# Patient Record
Sex: Male | Born: 1960 | Race: White | Hispanic: No | Marital: Married | Smoking: Current every day smoker
Health system: Southern US, Community
[De-identification: ages and names within clinical notes are randomized; demographics above are authoritative.]

## PROBLEM LIST (undated history)

## (undated) DIAGNOSIS — F908 Attention-deficit hyperactivity disorder, other type: Secondary | ICD-10-CM

## (undated) DIAGNOSIS — E039 Hypothyroidism, unspecified: Secondary | ICD-10-CM

## (undated) DIAGNOSIS — F319 Bipolar disorder, unspecified: Secondary | ICD-10-CM

## (undated) DIAGNOSIS — E782 Mixed hyperlipidemia: Secondary | ICD-10-CM

## (undated) DIAGNOSIS — I639 Cerebral infarction, unspecified: Secondary | ICD-10-CM

## (undated) DIAGNOSIS — K219 Gastro-esophageal reflux disease without esophagitis: Secondary | ICD-10-CM

## (undated) DIAGNOSIS — G894 Chronic pain syndrome: Secondary | ICD-10-CM

## (undated) DIAGNOSIS — D126 Benign neoplasm of colon, unspecified: Secondary | ICD-10-CM

## (undated) DIAGNOSIS — R0789 Other chest pain: Secondary | ICD-10-CM

## (undated) DIAGNOSIS — G4730 Sleep apnea, unspecified: Secondary | ICD-10-CM

## (undated) DIAGNOSIS — I10 Essential (primary) hypertension: Secondary | ICD-10-CM

## (undated) DIAGNOSIS — I2510 Atherosclerotic heart disease of native coronary artery without angina pectoris: Secondary | ICD-10-CM

## (undated) DIAGNOSIS — E119 Type 2 diabetes mellitus without complications: Secondary | ICD-10-CM

## (undated) DIAGNOSIS — M5416 Radiculopathy, lumbar region: Secondary | ICD-10-CM

## (undated) DIAGNOSIS — M1990 Unspecified osteoarthritis, unspecified site: Secondary | ICD-10-CM

## (undated) DIAGNOSIS — J45909 Unspecified asthma, uncomplicated: Secondary | ICD-10-CM

## (undated) DIAGNOSIS — J439 Emphysema, unspecified: Secondary | ICD-10-CM

## (undated) DIAGNOSIS — J189 Pneumonia, unspecified organism: Secondary | ICD-10-CM

## (undated) DIAGNOSIS — E1169 Type 2 diabetes mellitus with other specified complication: Secondary | ICD-10-CM

## (undated) DIAGNOSIS — R911 Solitary pulmonary nodule: Secondary | ICD-10-CM

## (undated) DIAGNOSIS — F4310 Post-traumatic stress disorder, unspecified: Secondary | ICD-10-CM

## (undated) DIAGNOSIS — F3181 Bipolar II disorder: Secondary | ICD-10-CM

## (undated) DIAGNOSIS — E1140 Type 2 diabetes mellitus with diabetic neuropathy, unspecified: Secondary | ICD-10-CM

## (undated) DIAGNOSIS — N529 Male erectile dysfunction, unspecified: Secondary | ICD-10-CM

## (undated) DIAGNOSIS — T8859XA Other complications of anesthesia, initial encounter: Secondary | ICD-10-CM

## (undated) DIAGNOSIS — I700 Atherosclerosis of aorta: Secondary | ICD-10-CM

## (undated) DIAGNOSIS — E785 Hyperlipidemia, unspecified: Secondary | ICD-10-CM

## (undated) DIAGNOSIS — I499 Cardiac arrhythmia, unspecified: Secondary | ICD-10-CM

## (undated) DIAGNOSIS — T4145XA Adverse effect of unspecified anesthetic, initial encounter: Secondary | ICD-10-CM

## (undated) DIAGNOSIS — R51 Headache: Secondary | ICD-10-CM

## (undated) DIAGNOSIS — K760 Fatty (change of) liver, not elsewhere classified: Secondary | ICD-10-CM

## (undated) HISTORY — DX: Attention-deficit hyperactivity disorder, other type: F90.8

## (undated) HISTORY — DX: Benign neoplasm of colon, unspecified: D12.6

## (undated) HISTORY — DX: Gastro-esophageal reflux disease without esophagitis: K21.9

## (undated) HISTORY — PX: BACK SURGERY: SHX140

## (undated) HISTORY — PX: NOSE SURGERY: SHX723

## (undated) HISTORY — DX: Solitary pulmonary nodule: R91.1

## (undated) HISTORY — DX: Atherosclerotic heart disease of native coronary artery without angina pectoris: I25.10

## (undated) HISTORY — DX: Other chest pain: R07.89

## (undated) HISTORY — PX: HERNIA REPAIR: SHX51

## (undated) HISTORY — DX: Radiculopathy, lumbar region: M54.16

## (undated) HISTORY — DX: Atherosclerosis of aorta: I70.0

## (undated) HISTORY — DX: Male erectile dysfunction, unspecified: N52.9

## (undated) HISTORY — DX: Type 2 diabetes mellitus with other specified complication: E11.69

## (undated) HISTORY — DX: Cardiac arrhythmia, unspecified: I49.9

## (undated) HISTORY — DX: Chronic pain syndrome: G89.4

## (undated) HISTORY — PX: HAND SURGERY: SHX662

## (undated) HISTORY — DX: Bipolar disorder, unspecified: F31.9

## (undated) HISTORY — PX: MULTIPLE TOOTH EXTRACTIONS: SHX2053

## (undated) HISTORY — DX: Bipolar II disorder: F31.81

## (undated) HISTORY — DX: Fatty (change of) liver, not elsewhere classified: K76.0

## (undated) HISTORY — DX: Type 2 diabetes mellitus with diabetic neuropathy, unspecified: E11.40

## (undated) HISTORY — DX: Emphysema, unspecified: J43.9

## (undated) HISTORY — DX: Mixed hyperlipidemia: E78.2

## (undated) HISTORY — DX: Type 2 diabetes mellitus with other specified complication: E78.5

---

## 1999-04-15 ENCOUNTER — Emergency Department (HOSPITAL_COMMUNITY): Admission: EM | Admit: 1999-04-15 | Discharge: 1999-04-15 | Payer: Self-pay | Admitting: Emergency Medicine

## 1999-04-15 ENCOUNTER — Encounter: Payer: Self-pay | Admitting: Emergency Medicine

## 2001-01-20 ENCOUNTER — Encounter: Payer: Self-pay | Admitting: Orthopedic Surgery

## 2001-01-20 ENCOUNTER — Encounter: Admission: RE | Admit: 2001-01-20 | Discharge: 2001-01-20 | Payer: Self-pay | Admitting: Orthopedic Surgery

## 2001-03-01 ENCOUNTER — Encounter: Admission: RE | Admit: 2001-03-01 | Discharge: 2001-03-19 | Payer: Self-pay | Admitting: Orthopedic Surgery

## 2002-05-08 ENCOUNTER — Inpatient Hospital Stay (HOSPITAL_COMMUNITY): Admission: EM | Admit: 2002-05-08 | Discharge: 2002-05-16 | Payer: Self-pay | Admitting: Psychiatry

## 2002-06-22 ENCOUNTER — Encounter: Admission: RE | Admit: 2002-06-22 | Discharge: 2002-06-22 | Payer: Self-pay | Admitting: Orthopedic Surgery

## 2002-06-22 ENCOUNTER — Encounter: Payer: Self-pay | Admitting: Orthopedic Surgery

## 2003-04-24 ENCOUNTER — Encounter: Payer: Self-pay | Admitting: Orthopedic Surgery

## 2003-04-25 ENCOUNTER — Ambulatory Visit (HOSPITAL_COMMUNITY): Admission: RE | Admit: 2003-04-25 | Discharge: 2003-04-25 | Payer: Self-pay | Admitting: Orthopedic Surgery

## 2003-07-17 ENCOUNTER — Emergency Department (HOSPITAL_COMMUNITY): Admission: EM | Admit: 2003-07-17 | Discharge: 2003-07-17 | Payer: Self-pay | Admitting: Emergency Medicine

## 2003-09-26 ENCOUNTER — Inpatient Hospital Stay (HOSPITAL_COMMUNITY): Admission: AD | Admit: 2003-09-26 | Discharge: 2003-10-10 | Payer: Self-pay | Admitting: Psychiatry

## 2003-10-01 ENCOUNTER — Emergency Department (HOSPITAL_COMMUNITY): Admission: EM | Admit: 2003-10-01 | Discharge: 2003-10-02 | Payer: Self-pay | Admitting: Emergency Medicine

## 2004-12-03 ENCOUNTER — Inpatient Hospital Stay (HOSPITAL_COMMUNITY): Admission: RE | Admit: 2004-12-03 | Discharge: 2004-12-25 | Payer: Self-pay | Admitting: Psychiatry

## 2004-12-03 ENCOUNTER — Ambulatory Visit: Payer: Self-pay | Admitting: Psychiatry

## 2004-12-06 ENCOUNTER — Encounter: Payer: Self-pay | Admitting: Psychiatry

## 2004-12-12 ENCOUNTER — Encounter: Admission: RE | Admit: 2004-12-12 | Discharge: 2004-12-12 | Payer: Self-pay | Admitting: Psychiatry

## 2008-03-02 ENCOUNTER — Ambulatory Visit (HOSPITAL_BASED_OUTPATIENT_CLINIC_OR_DEPARTMENT_OTHER): Admission: RE | Admit: 2008-03-02 | Discharge: 2008-03-02 | Payer: Self-pay | Admitting: Orthopedic Surgery

## 2008-04-13 ENCOUNTER — Encounter: Admission: RE | Admit: 2008-04-13 | Discharge: 2008-04-13 | Payer: Self-pay | Admitting: Orthopedic Surgery

## 2008-05-04 ENCOUNTER — Ambulatory Visit (HOSPITAL_BASED_OUTPATIENT_CLINIC_OR_DEPARTMENT_OTHER): Admission: RE | Admit: 2008-05-04 | Discharge: 2008-05-04 | Payer: Self-pay | Admitting: Orthopedic Surgery

## 2008-09-25 ENCOUNTER — Inpatient Hospital Stay (HOSPITAL_COMMUNITY): Admission: RE | Admit: 2008-09-25 | Discharge: 2008-09-29 | Payer: Self-pay | Admitting: Neurosurgery

## 2010-08-11 HISTORY — PX: TOTAL KNEE ARTHROPLASTY: SHX125

## 2010-09-01 ENCOUNTER — Encounter (HOSPITAL_COMMUNITY): Payer: Self-pay | Admitting: Psychiatry

## 2010-11-05 ENCOUNTER — Encounter (HOSPITAL_COMMUNITY)
Admission: RE | Admit: 2010-11-05 | Discharge: 2010-11-05 | Disposition: A | Discharge status: Home / Self Care | Payer: BC Managed Care – PPO | Source: Ambulatory Visit | Attending: Orthopedic Surgery | Admitting: Orthopedic Surgery

## 2010-11-05 ENCOUNTER — Ambulatory Visit (HOSPITAL_COMMUNITY)
Admission: RE | Admit: 2010-11-05 | Discharge: 2010-11-05 | Disposition: A | Discharge status: Home / Self Care | Payer: BC Managed Care – PPO | Source: Ambulatory Visit | Attending: Orthopedic Surgery | Admitting: Orthopedic Surgery

## 2010-11-05 ENCOUNTER — Other Ambulatory Visit (HOSPITAL_COMMUNITY): Payer: Self-pay | Admitting: Orthopedic Surgery

## 2010-11-05 DIAGNOSIS — M1711 Unilateral primary osteoarthritis, right knee: Secondary | ICD-10-CM

## 2010-11-05 DIAGNOSIS — I10 Essential (primary) hypertension: Secondary | ICD-10-CM | POA: Insufficient documentation

## 2010-11-05 DIAGNOSIS — Z01812 Encounter for preprocedural laboratory examination: Secondary | ICD-10-CM | POA: Insufficient documentation

## 2010-11-05 DIAGNOSIS — Z01818 Encounter for other preprocedural examination: Secondary | ICD-10-CM | POA: Insufficient documentation

## 2010-11-05 DIAGNOSIS — G4730 Sleep apnea, unspecified: Secondary | ICD-10-CM | POA: Insufficient documentation

## 2010-11-05 DIAGNOSIS — F17200 Nicotine dependence, unspecified, uncomplicated: Secondary | ICD-10-CM | POA: Insufficient documentation

## 2010-11-05 DIAGNOSIS — Z01810 Encounter for preprocedural cardiovascular examination: Secondary | ICD-10-CM | POA: Insufficient documentation

## 2010-11-05 LAB — URINALYSIS, ROUTINE W REFLEX MICROSCOPIC
Glucose, UA: NEGATIVE mg/dL
Hgb urine dipstick: NEGATIVE
Specific Gravity, Urine: 1.008 (ref 1.005–1.030)
Urobilinogen, UA: 0.2 mg/dL (ref 0.0–1.0)

## 2010-11-05 LAB — CBC
HCT: 46.8 % (ref 39.0–52.0)
Hemoglobin: 15.9 g/dL (ref 13.0–17.0)
RDW: 13.5 % (ref 11.5–15.5)
WBC: 10.2 10*3/uL (ref 4.0–10.5)

## 2010-11-05 LAB — PROTIME-INR
INR: 0.97 (ref 0.00–1.49)
Prothrombin Time: 13.1 seconds (ref 11.6–15.2)

## 2010-11-05 LAB — SURGICAL PCR SCREEN
MRSA, PCR: NEGATIVE
Staphylococcus aureus: NEGATIVE

## 2010-11-05 LAB — COMPREHENSIVE METABOLIC PANEL
ALT: 30 U/L (ref 0–53)
CO2: 30 mEq/L (ref 19–32)
Calcium: 9.4 mg/dL (ref 8.4–10.5)
Creatinine, Ser: 0.89 mg/dL (ref 0.4–1.5)
GFR calc non Af Amer: >60 mL/min (ref >60)
Glucose, Bld: 110 mg/dL — ABNORMAL HIGH (ref 70–99)
Sodium: 135 mEq/L (ref 135–145)
Total Protein: 6.8 g/dL (ref 6.0–8.3)

## 2010-11-13 ENCOUNTER — Inpatient Hospital Stay (HOSPITAL_COMMUNITY): Payer: BC Managed Care – PPO

## 2010-11-13 ENCOUNTER — Inpatient Hospital Stay (HOSPITAL_COMMUNITY)
Admission: RE | Admit: 2010-11-13 | Discharge: 2010-11-15 | DRG: 209 | Disposition: A | Discharge status: Home Health | Payer: BC Managed Care – PPO | Source: Ambulatory Visit | Attending: Orthopedic Surgery | Admitting: Orthopedic Surgery

## 2010-11-13 DIAGNOSIS — E119 Type 2 diabetes mellitus without complications: Secondary | ICD-10-CM | POA: Diagnosis present

## 2010-11-13 DIAGNOSIS — E669 Obesity, unspecified: Secondary | ICD-10-CM | POA: Diagnosis present

## 2010-11-13 DIAGNOSIS — J4489 Other specified chronic obstructive pulmonary disease: Secondary | ICD-10-CM | POA: Diagnosis present

## 2010-11-13 DIAGNOSIS — E039 Hypothyroidism, unspecified: Secondary | ICD-10-CM | POA: Diagnosis present

## 2010-11-13 DIAGNOSIS — F319 Bipolar disorder, unspecified: Secondary | ICD-10-CM | POA: Diagnosis present

## 2010-11-13 DIAGNOSIS — M1710 Unilateral primary osteoarthritis, unspecified knee: Principal | ICD-10-CM | POA: Diagnosis present

## 2010-11-13 DIAGNOSIS — F17200 Nicotine dependence, unspecified, uncomplicated: Secondary | ICD-10-CM | POA: Diagnosis present

## 2010-11-13 DIAGNOSIS — J449 Chronic obstructive pulmonary disease, unspecified: Secondary | ICD-10-CM | POA: Diagnosis present

## 2010-11-13 DIAGNOSIS — G4733 Obstructive sleep apnea (adult) (pediatric): Secondary | ICD-10-CM | POA: Diagnosis present

## 2010-11-13 LAB — GLUCOSE, CAPILLARY: Glucose-Capillary: 155 mg/dL — ABNORMAL HIGH (ref 70–99)

## 2010-11-13 LAB — TYPE AND SCREEN
ABO/RH(D): A POS
Antibody Screen: NEGATIVE

## 2010-11-14 LAB — GLUCOSE, CAPILLARY: Glucose-Capillary: 171 mg/dL — ABNORMAL HIGH (ref 70–99)

## 2010-11-14 LAB — CBC
HCT: 38.3 % — ABNORMAL LOW (ref 39.0–52.0)
MCHC: 33.2 g/dL (ref 30.0–36.0)
Platelets: 257 10*3/uL (ref 150–400)
RDW: 13.9 % (ref 11.5–15.5)
WBC: 10.4 10*3/uL (ref 4.0–10.5)

## 2010-11-14 LAB — BASIC METABOLIC PANEL
CO2: 25 mEq/L (ref 19–32)
GFR calc Af Amer: >60 mL/min (ref >60)
GFR calc non Af Amer: >60 mL/min (ref >60)
Glucose, Bld: 159 mg/dL — ABNORMAL HIGH (ref 70–99)

## 2010-11-14 LAB — PROTIME-INR: INR: 1.11 (ref 0.00–1.49)

## 2010-11-14 LAB — HEMOGLOBIN A1C
Hgb A1c MFr Bld: 6.6 % — ABNORMAL HIGH (ref <5.7)
Mean Plasma Glucose: 143 mg/dL — ABNORMAL HIGH (ref <117)

## 2010-11-14 NOTE — Op Note (Signed)
NAME:  Rodney Leach, Rodney Leach NO.:  [id number]  MEDICAL RECORD NO.:  [id number]           PATIENT TYPE:  I  LOCATION:  5040                         FACILITY:  [facility]  PHYSICIAN:  Loreta Ave, M.D. DATE OF BIRTH:  15-Nov-1960  DATE OF PROCEDURE:  11/13/2010 DATE OF DISCHARGE:                              OPERATIVE REPORT   PREOPERATIVE DIAGNOSIS:  Right knee end-stage degenerative arthritis with some increased valgus alignment and more than 7 degrees flexion contracture.  Tricompartmental arthritis.  POSTOPERATIVE DIAGNOSIS:  Right knee end-stage degenerative arthritis with some increased valgus alignment and more than 7 degrees flexion contracture.  Tricompartmental arthritis.  PROCEDURE:  Modified minimally invasive right total knee replacement Stryker triathlon prosthesis.  Soft tissue balancing.  Cemented pegged posterior stabilized #6 femoral component.  Cemented #6 tibial component with a 9-mm polyethylene insert.  Cemented resurfacing 40-mm patellar component.  Soft tissue balancing.  SURGEON:  Loreta Ave, MD  ASSISTANT:  Genene Churn. Barry Dienes, [place], present throughout the entire case and necessary for timely completion of procedure.  ANESTHESIA:  General.  BLOOD LOSS:  Minimal.  SPECIMENS:  None.  CULTURES:  None.  COMPLICATIONS:  None.  DRESSING:  Soft compressive knee immobilizer.  DRAIN:  Hemovac x1.  PROCEDURE:  The patient was brought to the operating room and placed on the operating table in supine position.  After adequate anesthesia had been obtained, leg examined.  A 7-degrees flexion contracture, further flexion 90.  Marked grating crepitus.  A little increased valgus but tight throughout.  Tourniquet applied.  Prepped and draped in the usual sterile fashion.  Exsanguinated with elevation and Esmarch and tourniquet inflated to 350 mmHg.  Straight incision above the patella down to tibial tubercle.  Medial arthrotomy, vastus splitting.   Knee exposed.  Medial capsule release.  Marked grade 4 change throughout. Remnants of menisci, cruciate ligaments, loose body, spurs removed. Distal femur exposed.  Intramedullary guide placed.  Distal cut 10 mm set at 5 degrees of valgus.  Using epicondylar axis, femur was sized, cut, and fitted for posterior stabilized #6 component.  Proximal tibia exposed.  Extramedullary guide.  A 3-degree posterior slope cut.  Taken below the defect which was medial.  Sized to #6 component.  Recess examined to be sure all spurs and loose bodies removed.  Patella exposed, posterior 11 mm removed.  Size, drilled, and fitted for 40-mm component.  Medial capsule release had already been done.  Knee was cleared out.  Trials were put in place.  The #6 above and below and 9-mm insert.  Good tracking with a 40-mm patella.  Full extension, full flexion, good mechanical axis with good stability in flexion/extension. Tibia was marked for rotation and hand reamed.  All trials removed. Copious irrigation with a pulse irrigating device.  Cement prepared and placed on all components, firmly seated.  Polyethylene attached to tibia and knee reduced.  Patella held with clamp.  Once cement had hardened, it was reexamined.  Again, pleased with alignment, stability, motion, and tracking.  Hemovac placed through a separate stab wound.  Knee thoroughly irrigated with a pulse lavage.  Arthrotomy  closed with #1 Vicryl.  Skin and subcutaneous tissue with Vicryl and staples.  Knee injected with Marcaine.  Hemovac clamp.  Sterile compressive dressing applied.  Tourniquet deflated and removed.  Knee immobilizer applied. Anesthesia reversed.  Brought to recovery room.  Tolerated surgery well. No complications.     Loreta Ave, M.D.     DFM/MEDQ  D:  11/13/2010  T:  11/14/2010  Job:  [id number]  Electronically Signed by Mckinley Jewel M.D. on 11/14/2010 02:10:06 PM

## 2010-11-15 LAB — CBC
HCT: 40 % (ref 39.0–52.0)
Hemoglobin: 13.2 g/dL (ref 13.0–17.0)
MCHC: 33 g/dL (ref 30.0–36.0)
RBC: 4.46 MIL/uL (ref 4.22–5.81)

## 2010-11-15 LAB — BASIC METABOLIC PANEL
CO2: 28 mEq/L (ref 19–32)
Calcium: 8.6 mg/dL (ref 8.4–10.5)
Chloride: 97 mEq/L (ref 96–112)
Glucose, Bld: 155 mg/dL — ABNORMAL HIGH (ref 70–99)
Potassium: 4.2 mEq/L (ref 3.5–5.1)
Sodium: 132 mEq/L — ABNORMAL LOW (ref 135–145)

## 2010-11-15 LAB — GLUCOSE, CAPILLARY

## 2010-11-15 LAB — PROTIME-INR: INR: 1.27 (ref 0.00–1.49)

## 2010-11-26 LAB — GLUCOSE, CAPILLARY
Glucose-Capillary: 141 mg/dL — ABNORMAL HIGH (ref 70–99)
Glucose-Capillary: 119 mg/dL — ABNORMAL HIGH (ref 70–99)
Glucose-Capillary: 122 mg/dL — ABNORMAL HIGH (ref 70–99)
Glucose-Capillary: 128 mg/dL — ABNORMAL HIGH (ref 70–99)
Glucose-Capillary: 148 mg/dL — ABNORMAL HIGH (ref 70–99)
Glucose-Capillary: 153 mg/dL — ABNORMAL HIGH (ref 70–99)
Glucose-Capillary: 174 mg/dL — ABNORMAL HIGH (ref 70–99)
Glucose-Capillary: 174 mg/dL — ABNORMAL HIGH (ref 70–99)
Glucose-Capillary: 182 mg/dL — ABNORMAL HIGH (ref 70–99)

## 2010-11-26 LAB — CBC
HCT: 44.4 % (ref 39.0–52.0)
HCT: 37.7 % — ABNORMAL LOW (ref 39.0–52.0)
Hemoglobin: 15.2 g/dL (ref 13.0–17.0)
Hemoglobin: 12.9 g/dL — ABNORMAL LOW (ref 13.0–17.0)
MCHC: 34.2 g/dL (ref 30.0–36.0)
MCV: 92.6 fL (ref 78.0–100.0)
Platelets: 222 10*3/uL (ref 150–400)
RBC: 4.8 MIL/uL (ref 4.22–5.81)
RDW: 14.4 % (ref 11.5–15.5)
WBC: 9.6 10*3/uL (ref 4.0–10.5)

## 2010-11-26 LAB — BASIC METABOLIC PANEL
BUN: 16 mg/dL (ref 6–23)
CO2: 29 mEq/L (ref 19–32)
Calcium: 8.5 mg/dL (ref 8.4–10.5)
Chloride: 99 mEq/L (ref 96–112)
GFR calc Af Amer: >60 mL/min (ref >60)
GFR calc non Af Amer: >60 mL/min (ref >60)
GFR calc non Af Amer: >60 mL/min (ref >60)
Glucose, Bld: 164 mg/dL — ABNORMAL HIGH (ref 70–99)
Potassium: 4.8 mEq/L (ref 3.5–5.1)
Sodium: 134 mEq/L — ABNORMAL LOW (ref 135–145)

## 2010-11-26 LAB — TYPE AND SCREEN

## 2010-11-26 LAB — ABO/RH: ABO/RH(D): A POS

## 2010-12-24 NOTE — Op Note (Signed)
NAME:  Rodney Leach, Rodney Leach NO.:  [id number]   MEDICAL RECORD NO.:  [id number]          PATIENT TYPE:  AMB   LOCATION:  DSC                          FACILITY:  [facility]   PHYSICIAN:  Cindee Salt, M.D.       DATE OF BIRTH:  [birth year]   DATE OF PROCEDURE:  03/02/2008  DATE OF DISCHARGE:                               OPERATIVE REPORT   PREOPERATIVE DIAGNOSIS:  Carpal tunnel syndrome, right hand.   POSTOPERATIVE DIAGNOSIS:  Carpal tunnel syndrome, right hand.   OPERATION:  Decompression right median nerve.   SURGEON:  Cindee Salt, MD   ASSISTANT:  Carolyne Fiscal, RN   ANESTHESIA:  Forearm based IV regional with supplementation, 1%  Xylocaine without epinephrine.   ANESTHESIOLOGIST:  Bedelia Person, MD   HISTORY:  The patient is a 50 year old male with a history of carpal  tunnel syndrome.  EMG and nerve conduction is positive, which has not  responded to conservative treatment.  He is desirous of proceeding to  have this surgically released.  His postoperative course discussed along  with risks and complications.  He is aware that there is no guarantee  with the surgery, possibility of infection, recurrence of injury to  arteries, nerves, tendons, complete relief of symptoms, and dystrophy.  In the preoperative area, the patient was seen, the extremity marked by  both the patient and surgeon, questions encouraged and answered.  Antibiotic given.   PROCEDURE:  The patient was brought to the operating room where a  forearm-based IV regional anesthetic was carried out without difficulty,  was prepped using DuraPrep, supine position, right arm free.  A time-out  was taken.  A longitudinal incision was then made over the palm of the  hand and carried down through subcutaneous tissue.  Bleeders  electrocauterized.  A considerable amount of fatty tissue was  encountered.  Blunt and sharp dissection, the distal margin of the  retinaculum was identified.  The incision was then made to the  ulnar  side of median nerve with blunt and sharp dissection.  A right-angle and  Sewall retractor were placed between skin and forearm fascia.  The  fascia was released for approximately a centimeter and a half proximal  to the wrist crease under direct vision.  An area of deformity to the  nerve along with area of hyperemia was immediately apparent.  No further  lesions were identified.  The wound was irrigated.  Skin was then closed  with interrupted 5-0 Vicryl Rapide sutures.  A sterile  compressive dressing and splint was applied with the fingers free.  The  patient tolerated the procedure well and was taken to the recovery room  for observation in satisfactory condition.  He will be discharged to  home, to return to [facility] in 1 week on [place] NX.            ______________________________  Cindee Salt, M.D.     GK/MEDQ  D:  03/02/2008  T:  03/03/2008  Job:  [id number]

## 2010-12-24 NOTE — Op Note (Signed)
NAME:  Rodney Leach, Rodney Leach NO.:  [id number]   MEDICAL RECORD NO.:  [id number]          PATIENT TYPE:  INP   LOCATION:  3035                         FACILITY:  [facility]   PHYSICIAN:  Cristi Loron, M.D.DATE OF BIRTH:  August 31, 1960   DATE OF PROCEDURE:  09/25/2008  DATE OF DISCHARGE:                               OPERATIVE REPORT   BRIEF HISTORY:  The patient is a 50 year old white male who has suffered  from back and leg pain.  He failed medical management.  He was worked up  with a lumbar MRI and diskogram, which demonstrated the patient had disk  degeneration pain at L5-S1.  I discussed the various treatment options  with the patient including surgery.  He has weighed the risks, benefits,  and alternatives of the surgery and desired to proceed with a L5-S1  decompression, instrumentation, and fusion.   PREOPERATIVE DIAGNOSES:  L5-S1 degenerative disk disease, stenosis,  lumbar radiculopathy with lumbago.   POSTOPERATIVE DIAGNOSES:  L5-S1 degenerative disk disease, stenosis,  lumbar radiculopathy with lumbago.   PROCEDURE:  Bilateral L5 laminotomies and foraminotomies to decompress  the bilateral L5 and S1 nerve roots; L5-S1 posterior lumbar interbody  fusion with local morselized autograft bone and Actifuse/Vitoss bone  graft extender; insertion of L5-S1 interbody prosthesis (Capstone PEEK  interbody prosthesis); L5-S1 posterior nonsegmental instrumentation with  Legacy titanium pedicle screws and rods; L5-S1 posterolateral  arthrodesis with local morselized autograft bone, Actifuse/Vitoss bone  graft extenders.   SURGEON:  Cristi Loron, MD   ASSISTANT:  Payton Doughty, MD   ANESTHESIA:  General endotracheal.   ESTIMATED BLOOD LOSS:  300 mL.   SPECIMENS:  None.   DRAINS:  None.   COMPLICATIONS:  None.   DESCRIPTION FOR PROCEDURE:  The patient was brought to the operating  room by the Anesthesia team.  General endotracheal anesthesia was  induced.   The patient remained in the supine position.  The patient was  then carefully turned to the prone position on the Wilson frame.  His  lumbosacral region was then shaved with clippers and prepared with  Betadine scrub and Betadine solution.  Sterile drapes were applied.  I  then injected the area to be incised with Marcaine with epinephrine  solution.  I used a scalpel to make a linear midline incision over the  L5-S1 interspace.  I used electrocautery to perform a bilateral  subperiosteal dissection exposing spinous process and lamina of L3, 4,  5, and the upper sacrum.  We then obtained intraoperative radiograph to  confirm our location and then inserted the Versatrac retractor for  exposure.   Because of the patient's severe facet arthropathy, foraminal stenosis,  etc., a greater decompression was required for a posterior lumbar  interbody fusion, i.e. we performed a medial facetectomy and wide  foraminotomies about the bilateral L5 and S1 nerve roots.  This  completed the decompression.   We now turned our attention to the posterior lumbar fusion.  We incised  the L5-S1 intervertebral disk with a 15-blade scalpel and performed a  partial intervertebral section using pituitary forceps.  We  then  prepared the vertebral endplates for the fusion by clearing the soft  tissue with the curette.  We used trial spacers and determined using 10  x 26 mm Capstone PEEK interbody prosthesis.  We prefilled this  prosthesis with combination of local autograft bone, Actifuse, and  Vitoss bone graft extenders.  We then inserted the prosthesis into the  interspace of course after retracting the neural structures out of  harm's way.  There was good snug fit of prosthesis.  We filled in the  remainder of the disk space with local autograft bone, Actifuse, and  Vitoss bone graft extenders.  This completed the posterior lumbar  interbody fusion.   We now turned our attention to the instrumentation.   Under fluoroscopic  guidance, we cannulated the bilateral L5 and S1 pedicles with a bone  probe.  We tapped the pedicle with a 6.5 mm tap and then probed inside  the tapped pedicles with a ball probe to rule out cortical breeches.  We  then inserted 7.5 x 50-mm pedicle screws bilaterally at L5 and 7.5 x 45  screws bilaterally at S1.  We palpated along the medial aspect of the L5-  S1 pedicles and noted there was no cortical breeches.  We then connected  unilateral pedicle screws with lordotic rod.  We compressed the  construct and secured the plates with the caps, which we tightened  appropriately.  This completed the instrumentation.   We now turned our attention to posterolateral arthrodesis.  We used high-  speed drill to decorticate the remainder of the L5-S1 facet pars  transverse processes upper sacrum, etc. and then laid a combination of  local morselized autograft bone and Actifuse/Vitoss bone graft extenders  over these decorticated posterior structures.  This completed the  posterolateral arthrodesis.   We then obtained hemostasis using bipolar electrocautery.  We palpated  along the ventral surface of the thecal sac and along the external route  of bilateral L5-S1 nerve roots and noted they were well decompressed.  We then removed the retractor and then reapproximated the patient's  thoracolumbar fascia with interrupted #1 Vicryl suture, subcutaneous  using interrupted 2-0 Vicryl suture, and skin with Steri-Strips and  Benzoin.  The wound was then coated with bacitracin ointment.  A sterile  dressing was applied.  The drapes were removed and the patient was  subsequently returned to supine position where he was extubated by the  Anesthesia team and transported to post anesthesia care unit in stable  condition.  All sponge, instrument, and needle counts were correct at  the end of this case.      Cristi Loron, M.D.  Electronically Signed     JDJ/MEDQ  D:   09/25/2008  T:  09/26/2008  Job:  [id number]

## 2010-12-24 NOTE — Op Note (Signed)
NAME:  HAMZEH, TALL NO.:  [id number]   MEDICAL RECORD NO.:  [id number]          PATIENT TYPE:  AMB   LOCATION:  DSC                          FACILITY:  [facility]   PHYSICIAN:  Cindee Salt, M.D.       DATE OF BIRTH:  01/04/1961   DATE OF PROCEDURE:  05/04/2008  DATE OF DISCHARGE:                               OPERATIVE REPORT   PREOPERATIVE DIAGNOSIS:  Carpal tunnel syndrome, left hand.   POSTOPERATIVE DIAGNOSIS:  Carpal tunnel syndrome, left hand.   OPERATION:  Decompression, left median nerve.   SURGEON:  Cindee Salt, MD   ASSISTANT:  Carolyne Fiscal, RN   ANESTHESIA:  Forearm-based IV regional.   ANESTHESIOLOGIST:  Zenon Mayo, MD   HISTORY:  The patient is a 50 year old male with a history of carpal  tunnel syndrome.  EMG, nerve conductions positive.  Unresponsive to  conservative treatment.  He has elected to proceed to undergo  decompression.  He is aware of risks and complications including  infection; recurrence; injury to arteries, nerves, tendons; incomplete  relief of symptoms; and dystrophy.  In the preoperative area, the  patient is seen, the extremity marked by both the patient and surgeon,  antibiotic given, questions encouraged and answered.   PROCEDURE:  The patient was brought to the operating room where a  forearm-based IV regional anesthetic was carried out without difficulty.  He was prepped using DuraPrep in supine position with left arm free.  A  time-out was taken.  A longitudinal incision was made in the palm,  carried down through the subcutaneous tissue.  Bleeders were  electrocauterized.  Palmar fascia was split.  Superficial palmar arch  identified.  Flexor tendon to the Pribyl little finger identified to the  ulnar side of the median nerve.  Carpal retinaculum was incised with  sharp dissection.  Right angle and Sewall retractor were placed between  skin and forearm fascia.  The fascia was released for approximately a  centimeter  and a half proximal to the wrist crease under direct vision.  Canal was explored.  No further lesions were identified.  Area  compression to the nerve was apparent.  The wound was irrigated.  The  skin closed with interrupted 5-0 Vicryl Rapide sutures.  Sterile  compressive dressing and splint to the wrist was applied with fingers  free.  The patient tolerated the procedure well, was taken to the  recovery room for observation in satisfactory condition.  He will be  discharged home to return to [facility] in 1 week, on  Vicodin.           ______________________________  Cindee Salt, M.D.     GK/MEDQ  D:  05/04/2008  T:  05/05/2008  Job:  [id number]

## 2010-12-27 NOTE — Discharge Summary (Signed)
NAME:  Rodney Leach, Rodney Leach NO.:  [id number]   MEDICAL RECORD NO.:  [id number]          PATIENT TYPE:  IPS   LOCATION:  0301                          FACILITY:  [facility]   PHYSICIAN:  Jeanice Lim, M.D. DATE OF BIRTH:  01-20-1961   DATE OF ADMISSION:  12/03/2004  DATE OF DISCHARGE:  12/25/2004                                 DISCHARGE SUMMARY   IDENTIFYING DATA:  This is a 50 year old married Caucasian male voluntarily  admitted with a history of bipolar disorder.  Woke up at 1 a.m. six weeks  ago and felt that he had ruminating, racing thoughts, felt that medications  had quit on him.  Episodes of anxiety, panic, drinking a six-pack of beer to  control anxiety, having suicidal thoughts with a lot of frustration about  mood swings.  History of previous bipolar decompensation and then drinking  trying to control mood until he was stabilized and always requiring  hospitalization after beginning to drink.  Otherwise remained sober in  between episodes of bipolar decompensation.  Followed up at [facility].  History of narcissistic personality disorder.  Lives with girlfriend.   MEDICAL HISTORY:  Chronic knee pain, Crohn's disease and hypertension.   MEDICATIONS:  Had been on multiple medications and multiple changes occurred  at mental health center trying to stabilize him prior to admission which is  what happened prior to the last admission.   ALLERGIES:  SULFA medications.   PHYSICAL EXAMINATION:  Physical and neurologic exam within normal limits.   MENTAL STATUS EXAM:  Fully alert, cooperative.  Anxious affect.  Normal pace  of speech.  Anxious, irritable, labile, angry, threatening at times but  reporting that the first person he would hurt would be himself.  Cognition  was intact.  Judgment and insight poor.   ADMISSION DIAGNOSES:   AXIS I:  1.  Bipolar disorder, type 2, versus bipolar disorder, type 1, mixed state.  2.  Alcohol abuse.   AXIS II:  None.   AXIS III:  1.  Chronic knee pain.  2.  Gastroesophageal reflux disease.  3.  Hypothyroidism.   AXIS IV:  Moderate stressors.   AXIS V:  30/60.   HOSPITAL COURSE:  The patient was admitted and ordered routine p.r.n.  medications and underwent further monitoring.  Was encouraged to participate  in individual, group and milieu therapy.  Required some time for  stabilization on medications.  Multiple medications were changed and  adjusted and eventually optimized.  The patient had a quite high tolerance  and continued to complain of agitation, mood instability, rage, feeling like  he may hurt himself.  No reason to live, having multiple plans to kill  himself as well as to go off and possibly hurt someone else before killing  himself.  The patient was eventually stabilized on medications, showing  improvement in mood, improvement in sleep, appetite and became more  appropriate on the unit, in groups, participating in aftercare planning.  He  was given medication education.   DISCHARGE MEDICATIONS:  1.  Toprol XL 50 mg daily.  2.  Protonix 40  mg daily.  3.  Zocor 20 mg daily.  4.  Provigil 200 mg, 1 in the morning at 7 a.m.  5.  Cytomel 25 mg, 2 in the morning.  6.  Lamictal 25 mg, 2 in the morning.  7.  FiberCon laxative 625 mg, take 1 twice a day.  8.  Colace 100 mg, 1 twice a day.  9.  Celexa 1/2 q.a.m.   The patient had been tried on Seroquel and Lithobid at various doses.  1.  Topamax 25 mg, 2 at 8 p.m.  2.  Klonopin 1 mg, 1 at 7 a.m., 12 noon, 5 p.m. and 9 p.m.  This will be      short-term, to gradually taper.  The patient's anxiety and mood      instability was impossible to stabilize with straight detox and mood      stabilizers.  Therefore, benzodiazepines was used for severity of      anxiety and also in light of patient's motivation to remain abstinent      and periods of sobriety in between mood destabilization.  The patient      was given medication education and  education regarding his bipolar      condition and behavioral changes including modification of CBT.  3.  Trazodone 150 mg at 8 p.m.  4.  Lithium 300 mg, take 2-1/2  in the morning and 3 at night.  5.  Seroquel 300 mg, 1/2 t.i.d. and 300 mg, 2 q.h.s. and 100 mg q.h.s.   FOLLOW UP:  The patient was to follow up at [facility] on Thursday, Dec 26, 2004 at 9:45 a.m.   DISCHARGE DIAGNOSES:   AXIS I:  1.  Bipolar disorder, type 2, versus bipolar disorder, type 1, mixed state.  2.  Alcohol abuse.   AXIS II:  None.   AXIS III:  1.  Chronic knee pain.  2.  Gastroesophageal reflux disease.  3.  Hypothyroidism.   AXIS IV:  Moderate stressors.   AXIS V:  Global Assessment of Functioning on discharge 55-60.       JEM/MEDQ  D:  01/28/2005  T:  01/28/2005  Job:  [id number]

## 2010-12-27 NOTE — Discharge Summary (Signed)
NAME:  Rodney Leach, Rodney Leach NO.:  [id number]   MEDICAL RECORD NO.:  [id number]          PATIENT TYPE:  INP   LOCATION:  3035                         FACILITY:  [facility]   PHYSICIAN:  Cristi Loron, M.D.DATE OF BIRTH:  [birth year]   DATE OF ADMISSION:  09/25/2008  DATE OF DISCHARGE:  09/29/2008                               DISCHARGE SUMMARY   BRIEF HISTORY:  The patient is a 50 year old white male, who suffered  from back and leg pain.  He has failed medical management.  The patient  was worked up with a lumbar MRI and diskogram, which demonstrated the  patient degeneration and concordant pain at L5-S1.  I discussed the  various treatment options with the patient including surgery.  He has  weighed the risks, benefits, and alternatives of the surgery and decided  to proceed with an L5-S1 decompression, instrumentation, and fusion.   For further details of this admission, please refer to typed history and  physical.   HOSPITAL COURSE:  Admitted the patient to [facility] on  September 25, 2008.  On day of admission, I performed L5-S1  decompression, instrumentation, and fusion.  The surgery went well (for  full details of this operation, please refer to typed operative note).   POSTOPERATIVE COURSE:  The patient's postoperative course was as  follows:  We had the dietitian seen for the diabetes education consult,  was otherwise unremarkable.   The patient was discharged home on September 29, 2008.   DISCHARGE INSTRUCTIONS:  The patient was given written discharge  instructions, told to follow up with me in 4 weeks.   DISCHARGE PRESCRIPTIONS:  1. OxyContin 10 mg #60 one p.o. b.i.d.  2. Percocet 10/325, #100 one p.o. q.4 h. p.r.n. for breakthrough pain.  3. Valium 5 mg #50 one p.o. q.8 h. p.r.n. for muscle spasms.   FINAL DIAGNOSES:  L4-5 degenerative disk disease, stenosis, lumbar  radiculopathy, and lumbago.   PROCEDURE PERFORMED:  Bilateral L5  laminotomies, foraminotomies to  decompress the bilateral L5 as well as S1 nerve roots; L5-S1 posterior  lumbar interbody fusion with local morcellized autograft bone and  Actifuse/Vitoss  bone graft extenders; insertion of L5-S1 interbody prosthesis (Capstone  PEEK interbody prosthesis); L5-S1 posterior nonsegmental instrumentation  with Legacy titanium pedicle screws and rods; L5-S1 posterolateral  arthrodesis with local morcellized autograft bone, Actifuse/Vitoss bone  graft extenders.      Cristi Loron, M.D.  Electronically Signed     Cristi Loron, M.D.  Electronically Signed    JDJ/MEDQ  D:  11/02/2008  T:  11/03/2008  Job:  [id number]

## 2010-12-27 NOTE — Op Note (Signed)
NAME:  Rodney Leach, Rodney Leach                         ACCOUNT NO.:  [id number]   MEDICAL RECORD NO.:  [id number]                   PATIENT TYPE:  OIB   LOCATION:  2550                                 FACILITY:  [facility]   PHYSICIAN:  Rodney A. Chaney Malling, M.D.           DATE OF BIRTH:  Dec 16, 1960   DATE OF PROCEDURE:  04/25/2003  DATE OF DISCHARGE:                                 OPERATIVE REPORT   PREOPERATIVE DIAGNOSIS:  Previous partial medial meniscectomy, left knee  with possibly new meniscal injury.   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   OPERATION PERFORMED:  Chondroplasty, medial femoral condyle and femoral  notch area of the left knee; debridement median edge lateral meniscus, left  knee, synovectomy,  superior pouch left knee.   SURGEON:  Lenard Galloway. Chaney Malling, M.D.   ANESTHESIA:  General.   DESCRIPTION OF PROCEDURE:  With the arthroscope in the knee a very careful  examination of the knee was undertaken.  The articular cartilage on the  posterior aspect of the patella appeared fairly normal.  There was some  grade 2 and some early grade 3 cartilage changes in the femoral notch area.  The arthroscope was then passed into the intercondylar notch area and the  anterior cruciate ligament appeared absolutely normal.  The arthroscope was  then passed into the medial compartment.  The previous debridement of the  posterior horn of the medial meniscus could be seen.  The rest of the  meniscus appeared fairly normal.  The articular cartilage of the medial  tibial plateau appeared normal.  Over the weightbearing area of the medial  femoral condyle there was some grade 2 and grade 3 cartilage changes.  In  the femoral notch area, there was some grade 2 and grade 3 cartilage changes  as noted above.  In the lateral compartment, the articular  cartilage of the  lateral femoral condyle and lateral tibial plateau appeared normal.  In the  midthird of the lateral meniscus and the lead edge was some fraying and  tearing.   PROCEDURE:  The patient was placed on the operating table in the supine  position with the pneumatic tourniquet about the left upper thigh.  The left  leg was placed in a leg holder.  The entire left lower extremity was prepped  out with DuraPrep and draped out in the usual manner.  Marcaine was placed  in the knee and Xylocaine and epinephrine used to infiltrate puncture  wounds.  Infusion cannula was placed in the superomedial pouch, and the knee  distended with saline.  Anterior medial and anterior lateral portals were  made through the previous portal sites and the arthroscope was introduced.  The findings were as described above.  Attention was first turned to the  femoral notch area.  There was  some fraying and tearing of the articular  cartilage in the femoral notch area and this was debrided with the  chondroplasty shaver.  The arthroscope was then passed into the medial  compartment.  The medial meniscus appeared normal and the area that had been  previously debrided showed no new tears but this in the area of the medial  femoral condyle which had fraying and tearing of the articular cartilage.  This was debrided with a chondroplasty shaver to smooth cartilage. The  arthroscope was then passed into the lateral compartment. This looked fairly  normal as noted above but there was a tear of the leading edge of the  lateral meniscus and this was debrided with a series of baskets followed by  the intra-articular shaver.  The complete decompression of this small tear  was achieved very nicely.  None of the patella was seen in the knee.  The  knee was then filled with Marcaine and a large bulky pressure dressing was  applied.  The patient then returned to the recovery room in excellent  condition.  Technically  this went extremely well.   DRAINS:  None.   COMPLICATIONS:  None.                                               Rodney A. Chaney Malling, M.D.    RAM/MEDQ  D:  04/25/2003  T:  04/25/2003  Job:  [id number]

## 2010-12-27 NOTE — H&P (Signed)
NAME:  Rodney Leach, Rodney Leach                         ACCOUNT NO.:  [id number]   MEDICAL RECORD NO.:  [id number]                   PATIENT TYPE:  IPS   LOCATION:  0306                                 [facility]   PHYSICIAN:  Jeanice Lim, M.D.              DATE OF BIRTH:  July 28, 1961   DATE OF ADMISSION:  09/26/2003  DATE OF DISCHARGE:                         PSYCHIATRIC ADMISSION ASSESSMENT   IDENTIFYING INFORMATION:  A 50 year old married white male, voluntarily  admitted on September 26, 2003.   HISTORY OF PRESENT ILLNESS:  The patient presents with a history of  depression and suicidal thoughts.  The patient had planned to shoot himself  or overdose on his medication.  He reports he has no access to guns.  He  states that he has had multiple medication changes over the past several  months and he states that nothing works.  He has been sleeping about 2 hours  per night.  He is having problems concentrating.  He feels that he cannot  get his thoughts together.  He has had homicidal thoughts in the past but is  currently denying any suicidal or homicidal ideation.  At this time he just  feels very frustrated.   PAST PSYCHIATRIC HISTORY:  Third admission to [facility], was  here approximately 2 years ago for depression and alcohol abuse.  He sees  Dr. Cheree Ditto at mental health in [place].  He also sees International aid/development worker at  [employer].  He sees her twice a week.  He has no history of a suicide  attempt.  He also has been hospitalized at [facility] in December  of 2004 for 5 days.   SOCIAL HISTORY:  He is a 50 year old married white male.  He has 5 children,  ages 24, 29, 18, 2 and 69 two stepchildren.  He lives with his wife and 2  of the children.  He has been doing Holiday representative work.  He is currently not  working.  He also states he has some legal problems in regards to child  support.   FAMILY HISTORY:  Mother with bipolar.   ALCOHOL DRUG HISTORY:  The  patient smokes, denies any drug use.  He has a  history of alcohol abuse.   PAST MEDICAL HISTORY:  Primary care Jurell Basista is Dr. Kandis Cocking at [facility]  Medical problems are hypertension and GERD.   MEDICATIONS:  Toprol XL 25 mg, Effexor XR has been taking up to 3 a day for  the past week, has been on Depakote he is unsure of the dosage, and Protonix  40 mg daily.   DRUG ALLERGIES:  SULFA.   PHYSICAL EXAMINATION:  The patient was assessed at [facility].  The patient is a large-built male in no acute distress,  somewhat unkempt.  His vital signs are 98.1, 80 heart rate, 16 respiration.  Blood pressure is elevated at 162/111.  He is 6 feet 4 inches tall, he is  314 pounds.  His glucose is 103.  Urine drug screen is negative.  CBC is  within normal limits.  His valporic acid level on February 17 49.  TSH is  3.941.   MENTAL STATUS EXAM:  He is an alert, middle-aged male, cooperative, good eye  contract.  Somewhat unkempt.  Speech is clear.  The patient feels  frustrated.  He appears to be somewhat anxious.  Thought processes are  coherent, there is no evidence of psychosis.  His responses at goal  directed.  He does not appear to be responding to internal stimuli.  Cognitive function intact, memory is good, judgment and insight are fair.   ADMISSION DIAGNOSES:   AXIS I:  1. Rule out bipolar disorder.  2. Rule out alcohol abuse.   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   AXIS III:  Deferred.   AXIS IV:  Problems with occupation, other psychosocial problems related to  mental illness.   AXIS V:  Current is 30, this past 60-65.   PLAN:  Voluntary admission for decompensating functioning, suicidal  ideation.  Contract for safety, check every 15 minutes.  Will stabilize mood  and thinking so the patient can be safe and functional.  We will  resume his  medications, will check Depakote level for therapeutic range, will contact  mental health for back ground and recent use of medications.  Will consider  a family session with his wife.  The patient is to follow up with Dr. Cheree Ditto  and to continue to be medication compliant.   TENTATIVE LENGTH OF CARE:  4-6 days or more depending on the patient's  response to medication.     Landry Corporal, N.P.                       Jeanice Lim, M.D.    JO/MEDQ  D:  09/29/2003  T:  09/29/2003  Job:  [id number]

## 2010-12-27 NOTE — Discharge Summary (Signed)
NAME:  Rodney Leach, Rodney Leach                         ACCOUNT NO.:  [id number]   MEDICAL RECORD NO.:  [id number]                   PATIENT TYPE:  IPS   LOCATION:  0303                                 [facility]ACILITY[facility]   PHYSICIAN:  Geoffery Lyons, M.D.                   DATE OF BIRTH:  [birth year]   DATE OF ADMISSION:  05/08/2002  DATE OF DISCHARGE:  05/16/2002                                 DISCHARGE SUMMARY   CHIEF COMPLAINT AND PRESENT ILLNESS:  This was the second admission to [facility] for this 50 year old married white male  voluntarily admitted.  He had a history of living problems, was focusing on  confusion, felt like his life was out of control, missing work.  He had been  tapered off his medications.  His cardiologist was concerned about his  weight gain putting pressure on his heart.  He stopped the Depakote and the  Effexor and since then, he had been unable to work.  He was very anxious,  having difficulty breathing, had been drinking again to calm himself down.  He had suicidal thoughts, fearful, decreased sleep, increased appetite,  significant weight gain.   PAST PSYCHIATRIC HISTORY:  This was the second time at [facility].  The first time was for alcohol detoxification.  He had been at  [facility] and [facility].  He was following at [facility].   SUBSTANCE ABUSE HISTORY:  He had relapsed on alcohol, drinking to keep  himself calm, according to self-report.   PAST MEDICAL HISTORY:  1. Crohn's disease.  2. Gastroesophageal reflux disease.  3. Bronchitis.  4. Carpal tunnel syndrome.   MEDICATIONS:  1. Effexor XR 75 mg in the morning.  2. Depakote ER 250 mg three times a day.   PHYSICAL EXAMINATION:  Physical examination was performed, failed to show  any acute findings.   MENTAL STATUS EXAM:  Mental status exam revealed an alert, tall, overweight  male, good eye contact, casually  dressed.  Speech was clear.  Mood was  anxious.  Affect was anxious.  Thought processes: Coherent; no evidence of  psychosis, no auditory and visual hallucinations, no paranoia, no suicidal  or homicidal ideas.  Cognitive: Cognition was well preserved.   ADMISSION DIAGNOSES:   AXIS I:  1. Anxiety disorder, not otherwise specified.  2. Major depression, recurrent.  3. Alcohol abuse.   AXIS II:  No diagnosis.   AXIS III:  1. Crohn's disease.  2. Umbilical hernia.  3. Carpal tunnel.  4. Gastroesophageal reflux disease.   AXIS IV:  Moderate.   AXIS V:  Global assessment of functioning upon admission 30, highest global  assessment of functioning in the last year 70.   LABORATORY DATA:  Other laboratory workup: SGOT was 55 and then 48; SGPT was  116 and then 114.  Hepatitis profile  was negative.   HOSPITAL COURSE:  He was admitted and started in intensive individual and  group psychotherapy.  He was detoxified using Librium.  He was given some  Seroquel as needed for anxiety.  He had a family session with his wife.  He  was experiencing a lot of mood fluctuation, tearful, anxious.  As he  continued to take the medication, his mood started improving.  He was  started on Zoloft that was increased up to 50 mg per day.  He continued to  work on Counsellor.  There were some difficulties,  especially in the afternoon so we went ahead and increased the Zoloft up to  50 mg twice a day.  As we increased the Zoloft, his mood improved, his  affect became brighter.  There was a mild headache that got better.  On  October 6, he was in full contact with reality, mood improved, affect bright  and broad, still a mild headache, no GI disturbances, cognition was getting  back to normal.  So, it was most possibly Effexor withdrawal, what he  experienced.  Upon discharge, he was in full contact with reality, no  suicidal or homicidal ideas, willing and motivated to pursue  further  outpatient treatment.   DISCHARGE DIAGNOSES:   AXIS I:  1. Anxiety disorder, not otherwise specified.  2. Major depression, recurrent.   AXIS II:  No diagnosis.   AXIS III:  1. Crohn's disease.  2. Umbilical hernia.  3. Carpal tunnel syndrome.  4. Gastroesophageal reflux disease.   AXIS IV:  Moderate.   AXIS V:  Global assessment of functioning upon discharge 50-55.   DISCHARGE MEDICATIONS:  1. Protonix 40 mg daily.  2. Colace 100 mg twice a day.  3. Zoloft 50 mg twice a day.  4. Midrin for headache.  5. Trazodone 50 mg at bedtime for sleep.   FOLLOW UP:  He was to follow up at [facility].                                                 Geoffery Lyons, M.D.    IL/MEDQ  D:  06/22/2002  T:  06/23/2002  Job:  [id number]
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
## 2010-12-27 NOTE — H&P (Signed)
NAME:  Rodney Leach, Rodney Leach                         ACCOUNT NO.:  [id number]   MEDICAL RECORD NO.:  [id number]                   PATIENT TYPE:  IPS   LOCATION:  0303                                 FACILITY:  [facility]   PHYSICIAN:  Geoffery Lyons, M.D.                   DATE OF BIRTH:  06/12/1961   DATE OF ADMISSION:  05/08/2002  DATE OF DISCHARGE:                         PSYCHIATRIC ADMISSION ASSESSMENT   IDENTIFYING INFORMATION:  A 50 year old married white, voluntarily admitted  on May 08, 2002.   HISTORY OF PRESENT ILLNESS:  The patient presents with a history of having  problems with focusing and confusion, feels like his life is out of control.  He has been missing work since the end of August.  He states he has been  tapered off his medications.  He states his cardiologist is concerned over  his weight gain and putting pressure on his heart.  The patient stopped his  Depakote and his Effexor and since then has been unable to go to work.  He  feels very anxious, having difficulty breathing.  He states he began  drinking again to calm himself down.  He has been having suicidal thoughts,  being very tearful.  Reports decreased sleep, increased appetite with  significant weight gain.   PAST PSYCHIATRIC HISTORY:  Second hospitalization at [facility], first time was for alcohol detox.  He was at [facility] years  ago and [facility] in 2001, currently attending [facility] as an outpatient, no history of a suicide attempt.   SOCIAL HISTORY:  He is a 50 year old married white male, married for 2  years, second marriage.  He has 1 child with his present wife, 2 years of  age.  He lives with his wife and child.  He has stepdaughter age of 90.  He  is self employed doing Holiday representative work.  He has been off of work since  August.  He has some court dates pending for child support and he is  currently on probation with failure to  perform work.   FAMILY HISTORY:  Unclear.   ALCOHOL DRUG HISTORY:  The patient smokes, denies any drug use, has relapsed  on his alcohol, drinking to keep himself calm.   PAST MEDICAL HISTORY:  Primary care Mamie Hundertmark is Dr. Leatrice Jewels.  Medical  problems are umbilical hernia, Crohn's disease, GERD, bronchitis, and carpal  tunnel right arm.   MEDICATIONS:  Effexor XR 75 mg q.a.m., has been on that for years, and  Depakote ER 250 mg t.i.d. has been on that for years.   DRUG ALLERGIES:  SULFA.   PHYSICAL EXAMINATION:  VITAL SIGNS:  99.1, 93 heart rate, respirations 20,  blood pressure is 142/77.  The patient is 6 feet 4 inches tall.  He is 333  pounds.  GENERAL:  The patient  is a 50 year old big build male,  appears his stated  age.  He is well groomed, alert and cooperative.  HEAD:  Normocephalic, atraumatic.  Can raise his eyebrows.  His hair is  short and receding.  Pupils are equal and reactive.  EOMs are intact  bilaterally.  Funduscopic exam is within normal limits.  External ear canals  are patent.  TMs are intact.  There is some cerumen noted.  Nostrils are  patent bilaterally.  No sinus tenderness.  Poor dentition.  No lesions were  seen.  Tongue protrudes midline without tremor.  Can clench his teeth and  puff out his cheeks.  Uvula is midline.  Gag reflex is intact.  Pharynx is  within normal limits.  NECK:  Supple, no JVD, negative lymphadenopathy.  Thyroid is nonpalpable and  nontender.  Trachea is midline.  CHEST:  Clear to auscultation, no adventitious sounds.  HEART:  Regular rate and rhythm, without murmurs, gallops or rubs.  Carotid  pulses are equal and adequate.  No edema was noted.  GENITALIA EXAM:  Deferred.  ABDOMEN:  Obese abdomen, tender to palpation left lower quadrant.  Active  bowel sounds are present.  No CVA tenderness.  MUSCULOSKELETAL:  No joint swelling or deformity.  Muscle strength and tone  is equal bilaterally.  SKIN:  Warm and dry, good  turgor.  Nail beds are pink with good capillary  refill, no clubbing or cyanosis.  NEUROLOGIC:  Oriented x3.  Cranial nerves are grossly intact.  Deep tendon  reflexes are 1+/4+ equal and adequate.  Good grip strength bilaterally.  No  involuntary movements.  Gait is normal.  Cerebellar function intact, with  heel to shin.  Romberg is negative.   LABORATORY DATA:  CBC within normal limits.  Alcohol level was less than  0.01.  Urine drug screen was negative.  CPK was 111, PT is 11, PTT is 26.1,  AST is 117, ALT is 182.  Normal EKG.   MENTAL STATUS EXAM:  He is an alert, tall, overweight male, good eye  contact.  Casually dressed, speech is clear.  Mood is anxious, affect is  anxious.  Thought processes are coherent.  There is no evidence of  psychosis, no auditory or visual hallucinations, no paranoid ideation,  suicidal or homicidal ideation.  Cognitive function intact, oriented x3,  some decreased focusing noted.  Judgment and insight is fair.   ADMISSION DIAGNOSES:   AXIS I:  1. Anxiety disorder not otherwise specified.  2. Rule out panic disorder.  3. Rule out major depression.   AXIS II:  Deferred.   AXIS III:  Crohn's disease, umbilical hernia, carpal tunnel,  gastroesophageal reflux disease.   AXIS IV:  Occupation and medical problems.   AXIS V:  Current is 30, this past year is 62.   PLAN:  Voluntary admission for anxiety and suicidal ideation.  Contract for  safety, check every 15 minutes.  Will monitor his blood pressure, will  initiate an antidepressant.  Treatment plan was discussed with Dr. Kathrynn Running  prior to initiating antidepressant.  The patient to follow up with mental  health.  Plan is to stabilize his mood and thinking so patient can be safe  and functional, to remain alcohol free.  Family session prior to discharge.   TENTATIVE LENGTH OF CARE:  3-5 days.      Landry Corporal, N.P.                       Geoffery Lyons, M.D.   JO/MEDQ  D:  05/15/2002  T:   05/15/2002  Job:  [id number]

## 2011-05-09 LAB — BASIC METABOLIC PANEL
BUN: 11
Creatinine, Ser: 0.86
GFR calc non Af Amer: >60
Glucose, Bld: 120 — ABNORMAL HIGH

## 2011-05-09 LAB — POCT HEMOGLOBIN-HEMACUE: Hemoglobin: 14.5

## 2011-05-12 LAB — BASIC METABOLIC PANEL
BUN: 14
Chloride: 102
Creatinine, Ser: 1
GFR calc non Af Amer: >60
Glucose, Bld: 93

## 2011-05-12 LAB — GLUCOSE, CAPILLARY: Glucose-Capillary: 135 — ABNORMAL HIGH

## 2012-03-22 ENCOUNTER — Encounter (HOSPITAL_COMMUNITY): Payer: Self-pay | Admitting: Pharmacy Technician

## 2012-03-31 ENCOUNTER — Encounter (HOSPITAL_COMMUNITY)
Admission: RE | Admit: 2012-03-31 | Discharge: 2012-03-31 | Disposition: A | Discharge status: Home / Self Care | Payer: Medicaid Other | Source: Ambulatory Visit | Attending: Surgery | Admitting: Surgery

## 2012-03-31 ENCOUNTER — Encounter (HOSPITAL_COMMUNITY): Payer: Self-pay

## 2012-03-31 ENCOUNTER — Encounter (HOSPITAL_COMMUNITY)
Admission: RE | Admit: 2012-03-31 | Discharge: 2012-03-31 | Disposition: A | Discharge status: Home / Self Care | Payer: Medicaid Other | Source: Ambulatory Visit | Attending: Orthopedic Surgery | Admitting: Orthopedic Surgery

## 2012-03-31 HISTORY — DX: Unspecified osteoarthritis, unspecified site: M19.90

## 2012-03-31 HISTORY — DX: Other complications of anesthesia, initial encounter: T88.59XA

## 2012-03-31 HISTORY — DX: Adverse effect of unspecified anesthetic, initial encounter: T41.45XA

## 2012-03-31 HISTORY — DX: Cerebral infarction, unspecified: I63.9

## 2012-03-31 HISTORY — DX: Sleep apnea, unspecified: G47.30

## 2012-03-31 HISTORY — DX: Headache: R51

## 2012-03-31 HISTORY — DX: Hypothyroidism, unspecified: E03.9

## 2012-03-31 LAB — CBC
Hemoglobin: 15.5 g/dL (ref 13.0–17.0)
MCH: 30.4 pg (ref 26.0–34.0)
Platelets: 280 10*3/uL (ref 150–400)
RBC: 5.1 MIL/uL (ref 4.22–5.81)
WBC: 10 10*3/uL (ref 4.0–10.5)

## 2012-03-31 LAB — URINALYSIS, ROUTINE W REFLEX MICROSCOPIC
Glucose, UA: NEGATIVE mg/dL
Leukocytes, UA: NEGATIVE
Protein, ur: NEGATIVE mg/dL
Specific Gravity, Urine: 1.019 (ref 1.005–1.030)
pH: 6 (ref 5.0–8.0)

## 2012-03-31 LAB — URINE MICROSCOPIC-ADD ON

## 2012-03-31 LAB — COMPREHENSIVE METABOLIC PANEL
ALT: 27 U/L (ref 0–53)
AST: 22 U/L (ref 0–37)
Alkaline Phosphatase: 111 U/L (ref 39–117)
CO2: 26 mEq/L (ref 19–32)
Chloride: 100 mEq/L (ref 96–112)
GFR calc Af Amer: >90 mL/min (ref >90)
GFR calc non Af Amer: >90 mL/min (ref >90)
Glucose, Bld: 89 mg/dL (ref 70–99)
Sodium: 134 mEq/L — ABNORMAL LOW (ref 135–145)
Total Bilirubin: 0.3 mg/dL (ref 0.3–1.2)

## 2012-03-31 LAB — SURGICAL PCR SCREEN
MRSA, PCR: NEGATIVE
Staphylococcus aureus: NEGATIVE

## 2012-03-31 NOTE — Progress Notes (Signed)
Requested old EKG from [facility], Dr. Lemar Livings. Have clearance letter from Dr. Lemar Livings.

## 2012-03-31 NOTE — Pre-Procedure Instructions (Signed)
20 Rodney Leach  03/31/2012   Your procedure is scheduled on:  Wednesday August 28  Report to [facility] Stay [facility] at 7:30 AM.  Call this number if you have problems the morning of surgery: [phone]   Remember:   Do not eat or drink:After Midnight.    Take these medicines the morning of surgery with A SIP OF WATER: Synthroid   Do not wear jewelry, make-up or nail polish.  Do not wear lotions, powders, or perfumes. You may wear deodorant.  Do not shave 48 hours prior to surgery. Men may shave face and neck.  Do not bring valuables to the hospital.  Contacts, dentures or bridgework may not be worn into surgery.  Leave suitcase in the car. After surgery it may be brought to your room.  For patients admitted to the hospital, checkout time is 11:00 AM the day of discharge.   Patients discharged the day of surgery will not be allowed to drive home.  Name and phone number of your driver: NA  Special Instructions: Incentive Spirometry - Practice and bring it with you on the day of surgery. and CHG Shower Use Special Wash: 1/2 bottle night before surgery and 1/2 bottle morning of surgery.   Please read over the following fact sheets that you were given: Pain Booklet, Coughing and Deep Breathing, Blood Transfusion Information, Total Joint Packet and Surgical Site Infection Prevention

## 2012-04-05 NOTE — Consult Note (Signed)
Anesthesia chart review:  Patient is a 51 year old male scheduled for left TKR by Dr. Eulah Pont on 04/07/12.  History includes obesity with BMI 40, smoking, hypothyroidism, OSA with CPAP use, headaches, CVA, arthritis, prior L5-S1 fusion '10, right TKR '12, nasal fracture surgery.  He does not have a reported history of HTN, but his BP was elevated at 148/100 at his PAT visit on 03/31/12.  Unfortunately, it was not rechecked.  He has not required anti-hypertensive medication in the past, and his medical doctor, Dr. Burnell Blanks has cleared patient for this procedure (will request records).  For Anesthesia history, he reported waking up during a previous procedure.  It does not appear that an Anesthesia representative was asked to speak with him at his PAT visit, but I did review his anesthesia record from his last procedure here on 11/13/10 (right TKA).  This was done under GA and there were no known anesthesia complications.    Labs noted.  Cr WNL.  CXR from 03/31/12 showed mild hyperinflation and bronchitic changes.  EKG from 03/31/12 showed NSR, possible LAE, V1 findings suggestive of RV conduction delay.  It was not felt significantly changed from his EKG on 09/19/08.  Anticipate he can proceed if his BP is not significantly elevated on the day of surgery.  Shonna Chock, PA-C

## 2012-04-06 MED ORDER — CEFAZOLIN SODIUM 10 G IJ SOLR
3.0000 g | INTRAMUSCULAR | Status: AC
  Administered 2012-04-07: 3 g via INTRAVENOUS
  Filled 2012-04-06: qty 3000

## 2012-04-06 NOTE — H&P (Signed)
  [facility] [address]   [address] [place], [place] [address] [phone] A Division of [facility]  Loreta Ave, M.D.     Robert A. Thurston Hole, M.D.     Lunette Stands, M.D. Eulas Post, M.D.    Buford Dresser, M.D. Estell Harpin, M.D. Ralene Cork, D.O.          Genene Churn. Barry Dienes, PA-C            Kirstin A. Shepperson, PA-C Heath, OPA-C   RE: Rodney Leach, Rodney Leach                                [id number]      DOB: Dec 14, 1960 PROGRESS NOTE: 03-26-12 Chief complaint: Left knee pain.  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  Returns.  States that symptoms unchanged from previous visit.  He is wanting to proceed with left total knee replacement as scheduled.  We received pre-op medical clearance. Current medications: See attached list. Allergies: Sulfa and Vicodin. Past medical/surgical history: Right total knee replacement, hypothyroidism, Type II diabetes and lumbar surgery. Review of systems: Patient currently denies lightheadedness, dizziness, fevers or chills, cardiac, pulmonary, GI or GU issues. Family history: Positive for hypertension, diabetes, seizures, kidney disease, cancer and heart disease.   Social history: Admits smoking.  Patient is married and employed in [employer].    EXAMINATION: Height: 6?3.  Weight: 341 pounds.  Temperature: 98.7.  Respirations: 20.  Blood pressure: 137/82.  Pulse: 72.  Pleasant obese white male, alert and oriented x 3 and in no acute distress.  Gait is antalgic.  No increase in respiratory effort.  Head is normocephalic, a traumatic.  PERRLA, EOMI.  Cervical spine unremarkable.  Lungs: CTA bilaterally.  No wheezes.  Heart: RRR.  S1 and S2.  No murmurs.  Abdomen: Round and obese.  NBS x 4.  Soft and non-tender.  Left knee: Decreased range of motion.  Positive crepitus.  Joint line tender.   Ligaments stable.  Positive effusion.  Calf non-tender.  Neurovascularly intact.  Skin warm and dry.     X-RAYS: Left knee show end stage DJD with periarticular spurs.  IMPRESSION: End stage DJD, left knee, and chronic pain.  Failed conservative treatment.  PLAN: We will proceed with left total knee replacement as scheduled.  Surgical procedure, along with potential rehab/recovery time discussed.  All questions answered.    Loreta Ave, M.D.   Electronically verified by Loreta Ave, M.D. DFM(JMO):jjh D 03-26-12 T 03-29-12

## 2012-04-06 NOTE — Progress Notes (Signed)
Patient notified to arrive at 07:00. Patient verbalized understanding. 

## 2012-04-07 ENCOUNTER — Encounter (HOSPITAL_COMMUNITY): Payer: Self-pay | Admitting: *Deleted

## 2012-04-07 ENCOUNTER — Ambulatory Visit (HOSPITAL_COMMUNITY): Payer: Medicaid Other | Admitting: Vascular Surgery

## 2012-04-07 ENCOUNTER — Encounter (HOSPITAL_COMMUNITY): Payer: Self-pay | Admitting: Vascular Surgery

## 2012-04-07 ENCOUNTER — Encounter (HOSPITAL_COMMUNITY)
Admission: RE | Disposition: A | Discharge status: Home Health | Payer: Self-pay | Source: Ambulatory Visit | Attending: Orthopedic Surgery

## 2012-04-07 ENCOUNTER — Inpatient Hospital Stay (HOSPITAL_COMMUNITY)
Admission: RE | Admit: 2012-04-07 | Discharge: 2012-04-09 | DRG: 470 | Disposition: A | Discharge status: Home Health | Payer: Medicaid Other | Source: Ambulatory Visit | Attending: Orthopedic Surgery | Admitting: Orthopedic Surgery

## 2012-04-07 ENCOUNTER — Inpatient Hospital Stay (HOSPITAL_COMMUNITY): Payer: Medicaid Other

## 2012-04-07 DIAGNOSIS — Z01811 Encounter for preprocedural respiratory examination: Secondary | ICD-10-CM

## 2012-04-07 DIAGNOSIS — Z01818 Encounter for other preprocedural examination: Secondary | ICD-10-CM

## 2012-04-07 DIAGNOSIS — E039 Hypothyroidism, unspecified: Secondary | ICD-10-CM | POA: Diagnosis present

## 2012-04-07 DIAGNOSIS — K5909 Other constipation: Secondary | ICD-10-CM

## 2012-04-07 DIAGNOSIS — K560 Paralytic ileus: Secondary | ICD-10-CM | POA: Diagnosis not present

## 2012-04-07 DIAGNOSIS — E119 Type 2 diabetes mellitus without complications: Secondary | ICD-10-CM | POA: Diagnosis present

## 2012-04-07 DIAGNOSIS — E669 Obesity, unspecified: Secondary | ICD-10-CM | POA: Diagnosis present

## 2012-04-07 DIAGNOSIS — Y831 Surgical operation with implant of artificial internal device as the cause of abnormal reaction of the patient, or of later complication, without mention of misadventure at the time of the procedure: Secondary | ICD-10-CM | POA: Diagnosis not present

## 2012-04-07 DIAGNOSIS — IMO0002 Reserved for concepts with insufficient information to code with codable children: Principal | ICD-10-CM | POA: Diagnosis present

## 2012-04-07 DIAGNOSIS — K929 Disease of digestive system, unspecified: Secondary | ICD-10-CM | POA: Diagnosis not present

## 2012-04-07 DIAGNOSIS — Z01810 Encounter for preprocedural cardiovascular examination: Secondary | ICD-10-CM

## 2012-04-07 DIAGNOSIS — M1710 Unilateral primary osteoarthritis, unspecified knee: Principal | ICD-10-CM | POA: Diagnosis present

## 2012-04-07 DIAGNOSIS — Z01812 Encounter for preprocedural laboratory examination: Secondary | ICD-10-CM

## 2012-04-07 DIAGNOSIS — Z96659 Presence of unspecified artificial knee joint: Secondary | ICD-10-CM

## 2012-04-07 HISTORY — PX: TOTAL KNEE ARTHROPLASTY: SHX125

## 2012-04-07 SURGERY — ARTHROPLASTY, KNEE, TOTAL
Anesthesia: General | Site: Knee | Laterality: Left | Wound class: Clean

## 2012-04-07 MED ORDER — WARFARIN - PHARMACIST DOSING INPATIENT: Freq: Every day | Status: DC

## 2012-04-07 MED ORDER — SUCCINYLCHOLINE CHLORIDE 20 MG/ML IJ SOLN
INTRAMUSCULAR | Status: DC | PRN
  Administered 2012-04-07: 140 mg via INTRAVENOUS

## 2012-04-07 MED ORDER — PHENOL 1.4 % MT LIQD: 1.0000 | OROMUCOSAL | Status: DC | PRN

## 2012-04-07 MED ORDER — NALOXONE HCL 0.4 MG/ML IJ SOLN: 0.4000 mg | INTRAMUSCULAR | Status: DC | PRN

## 2012-04-07 MED ORDER — MORPHINE SULFATE (PF) 1 MG/ML IV SOLN
INTRAVENOUS | Status: AC
  Filled 2012-04-07: qty 25

## 2012-04-07 MED ORDER — DIPHENHYDRAMINE HCL 50 MG/ML IJ SOLN: 12.5000 mg | Freq: Four times a day (QID) | INTRAMUSCULAR | Status: DC | PRN

## 2012-04-07 MED ORDER — DOCUSATE SODIUM 100 MG PO CAPS
100.0000 mg | ORAL_CAPSULE | Freq: Two times a day (BID) | ORAL | Status: DC
  Administered 2012-04-07 – 2012-04-09 (×3): 100 mg via ORAL
  Filled 2012-04-07 (×5): qty 1

## 2012-04-07 MED ORDER — MENTHOL 3 MG MT LOZG: 1.0000 | LOZENGE | OROMUCOSAL | Status: DC | PRN

## 2012-04-07 MED ORDER — BUPIVACAINE-EPINEPHRINE PF 0.5-1:200000 % IJ SOLN
INTRAMUSCULAR | Status: DC | PRN
  Administered 2012-04-07: 25 mL

## 2012-04-07 MED ORDER — LIDOCAINE HCL (CARDIAC) 20 MG/ML IV SOLN
INTRAVENOUS | Status: DC | PRN
  Administered 2012-04-07: 80 mg via INTRAVENOUS

## 2012-04-07 MED ORDER — METHOCARBAMOL 500 MG PO TABS
500.0000 mg | ORAL_TABLET | Freq: Four times a day (QID) | ORAL | Status: DC | PRN
  Administered 2012-04-08 – 2012-04-09 (×2): 500 mg via ORAL
  Filled 2012-04-07 (×3): qty 1

## 2012-04-07 MED ORDER — ONDANSETRON HCL 4 MG/2ML IJ SOLN: 4.0000 mg | Freq: Four times a day (QID) | INTRAMUSCULAR | Status: DC | PRN

## 2012-04-07 MED ORDER — MORPHINE SULFATE (PF) 1 MG/ML IV SOLN
INTRAVENOUS | Status: DC
  Administered 2012-04-07: 7.45 mg via INTRAVENOUS
  Administered 2012-04-07 (×2): via INTRAVENOUS

## 2012-04-07 MED ORDER — NEOSTIGMINE METHYLSULFATE 1 MG/ML IJ SOLN
INTRAMUSCULAR | Status: DC | PRN
  Administered 2012-04-07: 5 mg via INTRAVENOUS

## 2012-04-07 MED ORDER — ONDANSETRON HCL 4 MG/2ML IJ SOLN: 4.0000 mg | Freq: Once | INTRAMUSCULAR | Status: DC | PRN

## 2012-04-07 MED ORDER — SUFENTANIL CITRATE 50 MCG/ML IV SOLN
INTRAVENOUS | Status: DC | PRN
  Administered 2012-04-07: 20 ug via INTRAVENOUS
  Administered 2012-04-07 (×2): 10 ug via INTRAVENOUS

## 2012-04-07 MED ORDER — ONDANSETRON HCL 4 MG/2ML IJ SOLN
4.0000 mg | Freq: Four times a day (QID) | INTRAMUSCULAR | Status: DC | PRN
  Administered 2012-04-07 – 2012-04-08 (×3): 4 mg via INTRAVENOUS
  Filled 2012-04-07 (×3): qty 2

## 2012-04-07 MED ORDER — MORPHINE SULFATE 4 MG/ML IJ SOLN
INTRAMUSCULAR | Status: DC | PRN
  Administered 2012-04-07: 4 mg

## 2012-04-07 MED ORDER — METOCLOPRAMIDE HCL 10 MG PO TABS: 5.0000 mg | ORAL_TABLET | Freq: Three times a day (TID) | ORAL | Status: DC | PRN

## 2012-04-07 MED ORDER — LACTATED RINGERS IV SOLN
INTRAVENOUS | Status: DC
  Administered 2012-04-07: 10:00:00 via INTRAVENOUS

## 2012-04-07 MED ORDER — SENNOSIDES-DOCUSATE SODIUM 8.6-50 MG PO TABS: 1.0000 | ORAL_TABLET | Freq: Every evening | ORAL | Status: DC | PRN

## 2012-04-07 MED ORDER — HYDROMORPHONE HCL PF 1 MG/ML IJ SOLN
INTRAMUSCULAR | Status: AC
  Administered 2012-04-07: 1 mg
  Filled 2012-04-07: qty 2

## 2012-04-07 MED ORDER — POTASSIUM CHLORIDE IN NACL 20-0.9 MEQ/L-% IV SOLN
INTRAVENOUS | Status: DC
  Administered 2012-04-07 – 2012-04-08 (×2): via INTRAVENOUS
  Filled 2012-04-07 (×6): qty 1000

## 2012-04-07 MED ORDER — ONDANSETRON HCL 4 MG/2ML IJ SOLN
INTRAMUSCULAR | Status: DC | PRN
  Administered 2012-04-07: 4 mg via INTRAVENOUS

## 2012-04-07 MED ORDER — SODIUM CHLORIDE 0.9 % IJ SOLN: 9.0000 mL | INTRAMUSCULAR | Status: DC | PRN

## 2012-04-07 MED ORDER — CEFAZOLIN SODIUM 1-5 GM-% IV SOLN
1.0000 g | Freq: Three times a day (TID) | INTRAVENOUS | Status: AC
  Administered 2012-04-07 – 2012-04-08 (×3): 1 g via INTRAVENOUS
  Filled 2012-04-07 (×3): qty 50

## 2012-04-07 MED ORDER — BUPIVACAINE HCL (PF) 0.25 % IJ SOLN
INTRAMUSCULAR | Status: AC
  Filled 2012-04-07: qty 30

## 2012-04-07 MED ORDER — ENOXAPARIN SODIUM 30 MG/0.3ML ~~LOC~~ SOLN
30.0000 mg | Freq: Two times a day (BID) | SUBCUTANEOUS | Status: DC
  Administered 2012-04-08 – 2012-04-09 (×3): 30 mg via SUBCUTANEOUS
  Filled 2012-04-07 (×5): qty 0.3

## 2012-04-07 MED ORDER — FENTANYL CITRATE 0.05 MG/ML IJ SOLN
INTRAMUSCULAR | Status: AC
  Filled 2012-04-07: qty 2

## 2012-04-07 MED ORDER — METHOCARBAMOL 100 MG/ML IJ SOLN
500.0000 mg | Freq: Four times a day (QID) | INTRAVENOUS | Status: DC | PRN
  Administered 2012-04-07: 500 mg via INTRAVENOUS
  Filled 2012-04-07: qty 5

## 2012-04-07 MED ORDER — PROPOFOL 10 MG/ML IV EMUL
INTRAVENOUS | Status: DC | PRN
  Administered 2012-04-07: 300 mg via INTRAVENOUS

## 2012-04-07 MED ORDER — ROCURONIUM BROMIDE 100 MG/10ML IV SOLN
INTRAVENOUS | Status: DC | PRN
  Administered 2012-04-07: 50 mg via INTRAVENOUS

## 2012-04-07 MED ORDER — DIPHENHYDRAMINE HCL 12.5 MG/5ML PO ELIX: 12.5000 mg | ORAL_SOLUTION | Freq: Four times a day (QID) | ORAL | Status: DC | PRN

## 2012-04-07 MED ORDER — PATIENT'S GUIDE TO USING COUMADIN BOOK
Freq: Once | Status: AC
  Administered 2012-04-07: 17:00:00
  Filled 2012-04-07: qty 1

## 2012-04-07 MED ORDER — LEVOTHYROXINE SODIUM 175 MCG PO TABS
175.0000 ug | ORAL_TABLET | Freq: Every day | ORAL | Status: DC
  Administered 2012-04-09: 175 ug via ORAL
  Filled 2012-04-07 (×3): qty 1

## 2012-04-07 MED ORDER — WARFARIN VIDEO: Freq: Once | Status: DC

## 2012-04-07 MED ORDER — LACTATED RINGERS IV SOLN
INTRAVENOUS | Status: DC | PRN
  Administered 2012-04-07 (×2): via INTRAVENOUS

## 2012-04-07 MED ORDER — ACETAMINOPHEN 650 MG RE SUPP: 650.0000 mg | Freq: Four times a day (QID) | RECTAL | Status: DC | PRN

## 2012-04-07 MED ORDER — WARFARIN SODIUM 10 MG PO TABS
10.0000 mg | ORAL_TABLET | Freq: Every day | ORAL | Status: DC
  Administered 2012-04-07: 10 mg via ORAL
  Filled 2012-04-07 (×2): qty 1

## 2012-04-07 MED ORDER — BUPIVACAINE HCL (PF) 0.25 % IJ SOLN
INTRAMUSCULAR | Status: DC | PRN
  Administered 2012-04-07: 30 mL via INTRA_ARTICULAR

## 2012-04-07 MED ORDER — ACETAMINOPHEN 325 MG PO TABS: 650.0000 mg | ORAL_TABLET | Freq: Four times a day (QID) | ORAL | Status: DC | PRN

## 2012-04-07 MED ORDER — MORPHINE SULFATE 4 MG/ML IJ SOLN
INTRAMUSCULAR | Status: AC
  Filled 2012-04-07: qty 1

## 2012-04-07 MED ORDER — METOCLOPRAMIDE HCL 5 MG/ML IJ SOLN
5.0000 mg | Freq: Three times a day (TID) | INTRAMUSCULAR | Status: DC | PRN
  Administered 2012-04-08: 10 mg via INTRAVENOUS
  Filled 2012-04-07: qty 2

## 2012-04-07 MED ORDER — FENTANYL CITRATE 0.05 MG/ML IJ SOLN
50.0000 ug | INTRAMUSCULAR | Status: DC | PRN
  Administered 2012-04-07: 100 ug via INTRAVENOUS

## 2012-04-07 MED ORDER — HYDROMORPHONE HCL PF 1 MG/ML IJ SOLN
0.2500 mg | INTRAMUSCULAR | Status: DC | PRN
  Administered 2012-04-07: 0.5 mg via INTRAVENOUS

## 2012-04-07 MED ORDER — GLYCOPYRROLATE 0.2 MG/ML IJ SOLN
INTRAMUSCULAR | Status: DC | PRN
  Administered 2012-04-07: 0.6 mg via INTRAVENOUS

## 2012-04-07 MED ORDER — ALUM & MAG HYDROXIDE-SIMETH 200-200-20 MG/5ML PO SUSP: 30.0000 mL | ORAL | Status: DC | PRN

## 2012-04-07 MED ORDER — ATORVASTATIN CALCIUM 10 MG PO TABS
10.0000 mg | ORAL_TABLET | Freq: Every day | ORAL | Status: DC
  Administered 2012-04-07 – 2012-04-09 (×2): 10 mg via ORAL
  Filled 2012-04-07 (×3): qty 1

## 2012-04-07 MED ORDER — ONDANSETRON HCL 4 MG PO TABS: 4.0000 mg | ORAL_TABLET | Freq: Four times a day (QID) | ORAL | Status: DC | PRN

## 2012-04-07 MED ORDER — SODIUM CHLORIDE 0.9 % IR SOLN
Status: DC | PRN
  Administered 2012-04-07: 3000 mL

## 2012-04-07 SURGICAL SUPPLY — 57 items
BANDAGE ESMARK 6X9 LF (GAUZE/BANDAGES/DRESSINGS) ×1 IMPLANT
BLADE SAG 18X100X1.27 (BLADE) ×4 IMPLANT
BNDG ESMARK 6X9 LF (GAUZE/BANDAGES/DRESSINGS) ×2
BOOTCOVER CLEANROOM LRG (PROTECTIVE WEAR) ×4 IMPLANT
BOWL SMART MIX CTS (DISPOSABLE) ×2 IMPLANT
CEMENT BONE SIMPLEX SPEEDSET (Cement) ×4 IMPLANT
CLOTH BEACON ORANGE TIMEOUT ST (SAFETY) ×2 IMPLANT
COVER BACK TABLE 24X17X13 BIG (DRAPES) ×2 IMPLANT
COVER SURGICAL LIGHT HANDLE (MISCELLANEOUS) ×2 IMPLANT
CUFF TOURNIQUET SINGLE 34IN LL (TOURNIQUET CUFF) ×2 IMPLANT
DRAPE EXTREMITY T 121X128X90 (DRAPE) ×2 IMPLANT
DRAPE PROXIMA HALF (DRAPES) ×2 IMPLANT
DRAPE U-SHAPE 47X51 STRL (DRAPES) ×2 IMPLANT
DRSG PAD ABDOMINAL 8X10 ST (GAUZE/BANDAGES/DRESSINGS) ×2 IMPLANT
DURAPREP 26ML APPLICATOR (WOUND CARE) ×2 IMPLANT
ELECT CAUTERY BLADE 6.4 (BLADE) ×2 IMPLANT
ELECT REM PT RETURN 9FT ADLT (ELECTROSURGICAL) ×2
ELECTRODE REM PT RTRN 9FT ADLT (ELECTROSURGICAL) ×1 IMPLANT
EVACUATOR 1/8 PVC DRAIN (DRAIN) ×2 IMPLANT
FACESHIELD LNG OPTICON STERILE (SAFETY) ×2 IMPLANT
GAUZE XEROFORM 5X9 LF (GAUZE/BANDAGES/DRESSINGS) ×2 IMPLANT
GLOVE BIOGEL PI IND STRL 8 (GLOVE) ×1 IMPLANT
GLOVE BIOGEL PI INDICATOR 8 (GLOVE) ×1
GLOVE ORTHO TXT STRL SZ7.5 (GLOVE) ×2 IMPLANT
GOWN PREVENTION PLUS XLARGE (GOWN DISPOSABLE) ×6 IMPLANT
GOWN STRL NON-REIN LRG LVL3 (GOWN DISPOSABLE) ×4 IMPLANT
GOWN STRL REIN 2XL XLG LVL4 (GOWN DISPOSABLE) ×2 IMPLANT
HANDPIECE INTERPULSE COAX TIP (DISPOSABLE) ×1
IMMOBILIZER KNEE 22 UNIV (SOFTGOODS) ×2 IMPLANT
IMMOBILIZER KNEE 24 THIGH 36 (MISCELLANEOUS) IMPLANT
IMMOBILIZER KNEE 24 UNIV (MISCELLANEOUS)
KIT BASIN OR (CUSTOM PROCEDURE TRAY) ×2 IMPLANT
KIT ROOM TURNOVER OR (KITS) ×2 IMPLANT
MANIFOLD NEPTUNE II (INSTRUMENTS) ×2 IMPLANT
NS IRRIG 1000ML POUR BTL (IV SOLUTION) IMPLANT
PACK TOTAL JOINT (CUSTOM PROCEDURE TRAY) ×6 IMPLANT
PAD ARMBOARD 7.5X6 YLW CONV (MISCELLANEOUS) ×4 IMPLANT
PAD CAST 4YDX4 CTTN HI CHSV (CAST SUPPLIES) ×1 IMPLANT
PADDING CAST COTTON 4X4 STRL (CAST SUPPLIES) ×1
PADDING CAST COTTON 6X4 STRL (CAST SUPPLIES) ×2 IMPLANT
RUBBERBAND STERILE (MISCELLANEOUS) ×2 IMPLANT
SET HNDPC FAN SPRY TIP SCT (DISPOSABLE) ×1 IMPLANT
SPONGE GAUZE 4X4 12PLY (GAUZE/BANDAGES/DRESSINGS) ×2 IMPLANT
STAPLER VISISTAT 35W (STAPLE) ×2 IMPLANT
SUCTION FRAZIER TIP 10 FR DISP (SUCTIONS) ×2 IMPLANT
SUT VIC AB 1 CTX 36 (SUTURE) ×2
SUT VIC AB 1 CTX36XBRD ANBCTR (SUTURE) ×2 IMPLANT
SUT VIC AB 2-0 CT1 27 (SUTURE) ×2
SUT VIC AB 2-0 CT1 TAPERPNT 27 (SUTURE) ×2 IMPLANT
SYR 30ML LL (SYRINGE) ×2 IMPLANT
SYR 30ML SLIP (SYRINGE) ×2 IMPLANT
TOWEL OR 17X24 6PK STRL BLUE (TOWEL DISPOSABLE) ×2 IMPLANT
TOWEL OR 17X26 10 PK STRL BLUE (TOWEL DISPOSABLE) ×2 IMPLANT
TRAY FOLEY CATH 14FR (SET/KITS/TRAYS/PACK) ×2 IMPLANT
TUBE CONNECTING 12X1/4 (SUCTIONS) ×2 IMPLANT
WATER STERILE IRR 1000ML POUR (IV SOLUTION) IMPLANT
YANKAUER SUCT BULB TIP NO VENT (SUCTIONS) ×2 IMPLANT

## 2012-04-07 NOTE — Progress Notes (Signed)
ANTICOAGULATION CONSULT NOTE - Initial Consult  Pharmacy Consult for Coumadin Indication: VTE prophylaxis  Allergies  Allergen Reactions  . Sulfa Antibiotics Rash    Patient Measurements: 154 kg Baseline INR = 1.08  Medical History: Past Medical History  Diagnosis Date  . Stroke     "light" stroke  . Hypothyroidism   . Sleep apnea     CPAP  . Headache   . Arthritis   . Complication of anesthesia     has woken up during surgery before    Assessment: 51 year old male weighing 154 kg to begin Coumadin for VTE prophylaxis  Goal of Therapy:  INR 2-3 Monitor platelets by anticoagulation protocol: Yes   Plan:  1) Coumadin 10 mg po daily at 1800 pm 2) Daily INR 3) Coumadin education  Thank you. Okey Regal, PharmD [phone]  04/07/2012,5:05 PM

## 2012-04-07 NOTE — Anesthesia Postprocedure Evaluation (Signed)
  Anesthesia Post-op Note  Patient: Rodney Leach  Procedure(s) Performed: Procedure(s) (LRB): TOTAL KNEE ARTHROPLASTY (Left)  Patient Location: PACU  Anesthesia Type: GA combined with regional for post-op pain  Level of Consciousness: awake, alert , oriented and patient cooperative  Airway and Oxygen Therapy: Patient Spontanous Breathing and Patient connected to nasal cannula oxygen  Post-op Pain: mild  Post-op Assessment: Post-op Vital signs reviewed, Patient's Cardiovascular Status Stable, Respiratory Function Stable, Patent Airway, No signs of Nausea or vomiting and Pain level controlled  Post-op Vital Signs: stable  Complications: No apparent anesthesia complications

## 2012-04-07 NOTE — Preoperative (Signed)
Beta Blockers   Reason not to administer Beta Blockers:Not Applicable 

## 2012-04-07 NOTE — Progress Notes (Signed)
Orthopedic Tech Progress Note Patient Details:  Rodney Leach 02-12-1961 [id number]  CPM Left Knee CPM Left Knee: On Left Knee Flexion (Degrees): 60  Left Knee Extension (Degrees): 0    Shawnie Pons 04/07/2012, 12:59 PM

## 2012-04-07 NOTE — Progress Notes (Signed)
Orthopedic Tech Progress Note Patient Details:  WIN GUAJARDO 1961/03/08 [id number]  Patient ID: Deveron Furlong, male   DOB: 1961/06/07, 51 y.o.   MRN: [id number]   Shawnie Pons 04/07/2012, 5:23 PM Trapeze bar

## 2012-04-07 NOTE — Interval H&P Note (Signed)
History and Physical Interval Note:  04/07/2012 8:33 AM  Rodney Leach  has presented today for surgery, with the diagnosis of DJD LEFT KNEE  The various methods of treatment have been discussed with the patient and family. After consideration of risks, benefits and other options for treatment, the patient has consented to  Procedure(s) (LRB): TOTAL KNEE ARTHROPLASTY (Left) as a surgical intervention .  The patient's history has been reviewed, patient examined, no change in status, stable for surgery.  I have reviewed the patient's chart and labs.  Questions were answered to the patient's satisfaction.     MURPHY,DANIEL F

## 2012-04-07 NOTE — Brief Op Note (Signed)
04/07/2012  1:44 PM  PATIENT:  Rodney Leach  51 y.o. male  PRE-OPERATIVE DIAGNOSIS:  DJD LEFT KNEE  POST-OPERATIVE DIAGNOSIS:  DJD LEFT KNEE  PROCEDURE:  Procedure(s) (LRB): TOTAL KNEE ARTHROPLASTY (Left)  SURGEON:  Surgeon(s) and Role:    * Loreta Ave, MD - Primary  PHYSICIAN ASSISTANT: Eduar Kumpf M   ANESTHESIA:   none  EBL:  Total I/O In: 1000 [I.V.:1000] Out: 350 [Urine:350]  SPECIMEN:  No Specimen  DISPOSITION OF SPECIMEN:  N/A  COUNTS:  YES  TOURNIQUET:   Total Tourniquet Time Documented: Thigh (Right) - 78 minutes  PATIENT DISPOSITION:  PACU - hemodynamically stable.

## 2012-04-07 NOTE — Plan of Care (Signed)
Problem: Consults Goal: Diagnosis- Total Joint Replacement Primary Total Knee left     

## 2012-04-07 NOTE — Anesthesia Procedure Notes (Addendum)
Procedure Name: Intubation Date/Time: 04/07/2012 10:30 AM Performed by: Marni Griffon Pre-anesthesia Checklist: Emergency Drugs available, Suction available, Patient being monitored and Patient identified Patient Re-evaluated:Patient Re-evaluated prior to inductionOxygen Delivery Method: Circle system utilized Preoxygenation: Pre-oxygenation with 100% oxygen Intubation Type: IV induction Ventilation: Mask ventilation without difficulty Laryngoscope Size: Mac and 4 Grade View: Grade I Tube type: Oral Tube size: 8.0 mm Number of attempts: 1 Airway Equipment and Method: Stylet Placement Confirmation: ETT inserted through vocal cords under direct vision,  positive ETCO2 and breath sounds checked- equal and bilateral Secured at: 23 (cm at gum) cm Tube secured with: Tape Dental Injury: Teeth and Oropharynx as per pre-operative assessment     Anesthesia Regional Block:   Narrative:    Anesthesia Regional Block:  Femoral nerve block  Pre-Anesthetic Checklist: ,, timeout performed, Correct Patient, Correct Site, Correct Laterality, Correct Procedure, Correct Position, site marked, Risks and benefits discussed,  Surgical consent,  Pre-op evaluation,  At surgeon's request and post-op pain management  Laterality: Left  Prep: Maximum Sterile Barrier Precautions used, chloraprep and alcohol swabs       Needles:  Injection technique: Single-shot  Needle Type: Stimulator Needle - 80        Needle insertion depth: 7 cm   Additional Needles:  Procedures: nerve stimulator Femoral nerve block  Nerve Stimulator or Paresthesia:  Response: 0.5 mA, 0.1 ms, 7 cm  Additional Responses:   Narrative:  Start time: 04/07/2012 10:00 AM End time: 04/07/2012 10:05 AM Injection made incrementally with aspirations every 5 mL.  Performed by: Personally  Anesthesiologist: Maren Beach MD  Additional Notes: Pt accepts procedure and risks. 25cc 0.5% Marcaine w/ epi w/o difficulty or  discomfort. GES

## 2012-04-07 NOTE — Transfer of Care (Signed)
Immediate Anesthesia Transfer of Care Note  Patient: Rodney Leach  Procedure(s) Performed: Procedure(s) (LRB): TOTAL KNEE ARTHROPLASTY (Left)  Patient Location: PACU  Anesthesia Type: General and Regional  Level of Consciousness: awake, alert  and oriented  Airway & Oxygen Therapy: Patient Spontanous Breathing and Patient connected to nasal cannula oxygen  Post-op Assessment: Report given to PACU RN, Post -op Vital signs reviewed and stable and Patient moving all extremities X 4  Post vital signs: Reviewed and stable  Complications: No apparent anesthesia complications

## 2012-04-07 NOTE — Transfer of Care (Signed)
Immediate Anesthesia Transfer of Care Note  Patient: Rodney Leach  Procedure(s) Performed: Procedure(s) (LRB): TOTAL KNEE ARTHROPLASTY (Left)  Patient Location: PACU  Anesthesia Type: General and Regional  Level of Consciousness: awake, alert , oriented and patient cooperative  Airway & Oxygen Therapy: Patient Spontanous Breathing and Patient connected to nasal cannula oxygen  Post-op Assessment: Report given to PACU RN, Post -op Vital signs reviewed and stable and Patient moving all extremities X 4  Post vital signs: Reviewed and stable  Complications: No apparent anesthesia complications

## 2012-04-07 NOTE — Anesthesia Preprocedure Evaluation (Signed)
Anesthesia Evaluation  Patient identified by MRN, date of birth, ID band Patient awake    Reviewed: Allergy & Precautions, H&P , NPO status   Airway Mallampati: II TM Distance: >3 FB Neck ROM: full    Dental   Pulmonary sleep apnea and Continuous Positive Airway Pressure Ventilation ,          Cardiovascular Rhythm:regular Rate:Normal     Neuro/Psych  Headaches,    GI/Hepatic   Endo/Other    Renal/GU      Musculoskeletal   Abdominal   Peds  Hematology   Anesthesia Other Findings   Reproductive/Obstetrics                           Anesthesia Physical Anesthesia Plan  ASA: III  Anesthesia Plan: General   Post-op Pain Management:    Induction: Intravenous  Airway Management Planned: Oral ETT  Additional Equipment:   Intra-op Plan:   Post-operative Plan: Extubation in OR  Informed Consent: I have reviewed the patients History and Physical, chart, labs and discussed the procedure including the risks, benefits and alternatives for the proposed anesthesia with the patient or authorized representative who has indicated his/her understanding and acceptance.     Plan Discussed with: Anesthesiologist, CRNA and Surgeon  Anesthesia Plan Comments:         Anesthesia Quick Evaluation

## 2012-04-08 ENCOUNTER — Encounter (HOSPITAL_COMMUNITY): Payer: Self-pay | Admitting: Orthopedic Surgery

## 2012-04-08 ENCOUNTER — Inpatient Hospital Stay (HOSPITAL_COMMUNITY): Payer: Medicaid Other

## 2012-04-08 LAB — PROTIME-INR
INR: 1.25 (ref 0.00–1.49)
Prothrombin Time: 16 s — ABNORMAL HIGH (ref 11.6–15.2)

## 2012-04-08 LAB — BASIC METABOLIC PANEL
CO2: 25 mEq/L (ref 19–32)
Chloride: 99 mEq/L (ref 96–112)
Glucose, Bld: 202 mg/dL — ABNORMAL HIGH (ref 70–99)
Potassium: 3.9 mEq/L (ref 3.5–5.1)
Sodium: 133 mEq/L — ABNORMAL LOW (ref 135–145)

## 2012-04-08 LAB — CBC
Hemoglobin: 13.3 g/dL (ref 13.0–17.0)
RBC: 4.4 MIL/uL (ref 4.22–5.81)
WBC: 13.9 10*3/uL — ABNORMAL HIGH (ref 4.0–10.5)

## 2012-04-08 MED ORDER — OXYCODONE-ACETAMINOPHEN 5-325 MG PO TABS
1.0000 | ORAL_TABLET | ORAL | Status: DC | PRN
  Filled 2012-04-08: qty 2

## 2012-04-08 MED ORDER — WARFARIN SODIUM 5 MG PO TABS
5.0000 mg | ORAL_TABLET | Freq: Once | ORAL | Status: AC
  Administered 2012-04-08: 5 mg via ORAL
  Filled 2012-04-08 (×2): qty 1

## 2012-04-08 MED ORDER — POLYETHYLENE GLYCOL 3350 17 G PO PACK
17.0000 g | PACK | Freq: Three times a day (TID) | ORAL | Status: DC
  Administered 2012-04-08 – 2012-04-09 (×3): 17 g via ORAL
  Filled 2012-04-08 (×5): qty 1

## 2012-04-08 MED ORDER — OXYCODONE HCL 5 MG PO TABS
5.0000 mg | ORAL_TABLET | ORAL | Status: DC | PRN
  Administered 2012-04-08 – 2012-04-09 (×4): 10 mg via ORAL
  Filled 2012-04-08 (×3): qty 2

## 2012-04-08 MED ORDER — POTASSIUM CHLORIDE CRYS ER 20 MEQ PO TBCR
20.0000 meq | EXTENDED_RELEASE_TABLET | Freq: Two times a day (BID) | ORAL | Status: DC
  Administered 2012-04-08 – 2012-04-09 (×2): 20 meq via ORAL
  Filled 2012-04-08 (×3): qty 1

## 2012-04-08 MED ORDER — OXYCODONE-ACETAMINOPHEN 5-325 MG PO TABS
1.0000 | ORAL_TABLET | ORAL | Status: DC | PRN
  Administered 2012-04-08 – 2012-04-09 (×3): 2 via ORAL
  Filled 2012-04-08 (×2): qty 2

## 2012-04-08 MED ORDER — MINERAL OIL RE ENEM
1.0000 | ENEMA | Freq: Once | RECTAL | Status: AC
  Administered 2012-04-08: 1 via RECTAL
  Filled 2012-04-08: qty 1

## 2012-04-08 MED ORDER — OXYCODONE HCL 5 MG PO TABS
5.0000 mg | ORAL_TABLET | ORAL | Status: DC | PRN
  Filled 2012-04-08 (×2): qty 1

## 2012-04-08 NOTE — Op Note (Signed)
Rodney Leach, Rodney Leach NO.:  [id number]  MEDICAL RECORD NO.:  [id number]  LOCATION:  5N29C                        FACILITY:  [facility]  PHYSICIAN:  Loreta Ave, M.D. DATE OF BIRTH:  1961-04-23  DATE OF PROCEDURE:  04/07/2012 DATE OF DISCHARGE:                              OPERATIVE REPORT   PREOPERATIVE DIAGNOSIS:  Left knee end-stage degenerative arthritis, varus alignment.  POSTOPERATIVE DIAGNOSIS:  Left knee end-stage degenerative arthritis, varus alignment.  PROCEDURE:  Left knee modified minimally invasive total knee replacement, Stryker triathlon prosthesis.  Soft tissue balancing. Cemented pegged posterior stabilized #6 femoral component.  Cemented #6 tibial component, 9-mm polyethylene insert.  Cemented resurfacing 40-mm patellar component.  SURGEON:  Loreta Ave, MD  ASSISTANT:  Genene Churn. Barry Dienes, [place], present throughout the entire case and necessary for timely completion of procedure.  ANESTHESIA:  General.  BLOOD LOSS:  Minimal.  SPECIMENS:  None.  CULTURES:  None.  COMPLICATIONS:  None.  DRESSINGS:  Soft compressive knee immobilizer.  DRAINS:  Hemovac x1.  TOURNIQUET TIME:  50 minutes.  PROCEDURE:  The patient was brought to the operating room and placed on the operating table in supine position.  After adequate anesthesia had been obtained, knee examined.  Very mild flexion contracture, 5 degrees of varus, partially correctable.  Flexion better than 90 degrees. Tourniquet applied.  Prepped and draped in usual sterile fashion. Exsanguinated with elevation of Esmarch.  Tourniquet was inflated to 350 mmHg.  Straight incision above the patella down to the tibial tubercle. Skin and subcutaneous tissue were divided.  Medial arthrotomy, vastus splitting preserving quad tendon.  Medial capsule was released.  Grade 4 changes throughout.  Remnants of menisci, cruciate ligaments were removed.  Distal femur was exposed.  Intramedullary  guide was placed. An 8 mm resection, 5 degrees of valgus.  Using epicondylar axis, the femur was sized, cut, fitted for a posterior stabilized #6 component. Proximal and tibial resection, extramedullary guide.  A 3-degree posterior slope cut.  Size 4 #6 component as well.  Debris cleared throughout, all recesses.  Nicely balanced in flexion and extension. Patella was exposed.  Posterior 11-mm was removed.  Drilled, sized, and fitted for a #45 component.  Trials were put in place.  A #6 above and below.  Tibia rotation was set with trials and hand reamed.  All trials were removed.  Copious irrigation with pulse irrigating device.  Cement was prepared, placed on all components, and firmly seated.  Polyethylene was attached to the tibia, knee reduced.  Patella held with clamp.  Once the cement had hardened, knee was examined.  Full extension, full flexion, good alignment, good stability, good patellofemoral tracking. Nicely balanced in flexion and extension.  Good biomechanical axis. Hemovac was placed and brought out through the separate stab wound. Arthrotomy was closed with #1 Vicryl.  Skin and subcutaneous tissue with Vicryl and staples.  Sterile compressive dressing was applied. Tourniquet was deflated and removed.  Knee immobilizer was applied. Anesthesia was reversed.  Brought to the recovery room.  Tolerated the surgery well.  No complications.     Loreta Ave, M.D.     DFM/MEDQ  D:  04/07/2012  T:  04/08/2012  Job:  [id number]

## 2012-04-08 NOTE — Progress Notes (Signed)
Occupational Therapy Evaluation Patient Details Name: Rodney Leach MRN: [id number] DOB: October 22, 1960 Today's Date: 04/08/2012 Time: 1250-1305 OT Time Calculation (min): 15 min  OT Assessment / Plan / Recommendation Clinical Impression  51 yo s/p L TKA. Per PT, pt S with sit - stand transfers. Pt has had R TKA and has all equipment from previous surgery. Pt will have family to assist 24/7 after D/C. No OT needs at this time. OT signing off.    OT Assessment  Patient does not need any further OT services    Follow Up Recommendations  No OT follow up    Barriers to Discharge      Equipment Recommendations  None recommended by OT    Recommendations for Other Services    Frequency    eval only   Precautions / Restrictions Precautions Precautions: Knee Precaution Booklet Issued: Yes (comment) Required Braces or Orthoses: Knee Immobilizer - Left Knee Immobilizer - Left: On except when in CPM Restrictions Weight Bearing Restrictions: Yes LLE Weight Bearing: Weight bearing as tolerated Other Position/Activity Restrictions: No pillow under L knee   Pertinent Vitals/Pain 8    ADL  Grooming: Set up Upper Body Bathing: Set up Where Assessed - Upper Body Bathing: Supine, head of bed up Lower Body Bathing: Moderate assistance Where Assessed - Lower Body Bathing: Supine, head of bed up Upper Body Dressing: Set up Where Assessed - Upper Body Dressing: Supine, head of bed up Lower Body Dressing: Minimal assistance Transfers/Ambulation Related to ADLs: not assessed due to pain. ADL Comments: Assist for LB ADL. Limited due to pain. Pt states wife will assist as needed with ADL.     OT Diagnosis:    OT Problem List:   OT Treatment Interventions:     OT Goals Acute Rehab OT Goals OT Goal Formulation:  (eval only)  Visit Information  Last OT Received On: 04/01/12 Assistance Needed: +1    Subjective Data      Prior Functioning  Vision/Perception  Home Living Lives With:  Spouse;Daughter;Family Available Help at Discharge: Family Type of Home: House Home Access: Stairs to enter Secretary/administrator of Steps: 1 Entrance Stairs-Rails: None Home Layout: One level Bathroom Shower/Tub: Walk-in shower;Door [employer] Toilet: Standard Bathroom Accessibility: Yes How Accessible: Accessible via walker Home Adaptive Equipment: Bedside commode/3-in-1;Crutches;Shower chair with back;Straight cane;Walker - rolling Additional Comments: has reacher and AE from previous surgeries Prior Function Level of Independence: Independent Able to Take Stairs?: Yes Driving: Yes Vocation: Full time employment Communication Communication: No difficulties Dominant Hand: Right      Cognition  Overall Cognitive Status: Appears within functional limits for tasks assessed/performed Arousal/Alertness: Awake/alert Orientation Level: Appears intact for tasks assessed Behavior During Session: [facility] for tasks performed    Extremity/Trunk Assessment Right Upper Extremity Assessment RUE ROM/Strength/Tone: Within functional levels Left Upper Extremity Assessment LUE ROM/Strength/Tone: Within functional levels Right Lower Extremity Assessment RLE ROM/Strength/Tone: Within functional levels Left Lower Extremity Assessment LLE ROM/Strength/Tone: Deficits LLE ROM/Strength/Tone Deficits: Unable to assess ROM secondary dried blood in dressing limiting ROM.  Trunk Assessment Trunk Assessment: Normal   Mobility  Shoulder Instructions    Exercise   Balance Balance Balance Assessed: No   End of Session OT - End of Session Activity Tolerance: Patient limited by pain Patient left: in bed;with call bell/phone within reach Nurse Communication: Other (comment) (need to address pain control/wait for GI) CPM Left Knee CPM Left Knee: Off  GO     Brooks Kinnan,HILLARY 04/08/2012, 1:45 PM [facility], OTR/L  [phone] 04/08/2012

## 2012-04-08 NOTE — Progress Notes (Signed)
Subjective: Patient c/o severe N/V since last night.  Also has left/lower abd pain.  Some flatus.  Last bm a couple of days ago.    Objective: Vital signs in last 24 hours: Temp:  [98 F (36.7 C)-98.6 F (37 C)] 98.5 F (36.9 C) (08/29 0621) Pulse Rate:  [59-89] 83  (08/29 0621) Resp:  [10-23] 18  (08/29 0621) BP: (128-159)/(66-91) 159/91 mmHg (08/29 0621) SpO2:  [95 %-100 %] 98 % (08/29 0621)  Intake/Output from previous day: 08/28 0701 - 08/29 0700 In: 2980 [P.O.:480; I.V.:2125] Out: 1350 [Urine:1350] Intake/Output this shift:     Basename 04/08/12 0530  HGB 13.3    Basename 04/08/12 0530  WBC 13.9*  RBC 4.40  HCT 39.7  PLT 285    Basename 04/08/12 0530  NA 133*  K 3.9  CL 99  CO2 25  BUN 13  CREATININE 0.84  GLUCOSE 202*  CALCIUM 8.8    Basename 04/08/12 0530  LABPT --  INR 1.25    Exam:  Marked epigastric, left sided and lower abd ttp.  abd distended.  Some bleeding thru dressing.  Patient lethargic.    Assessment/Plan: Ordered kub which showed large dilated loops of bowel, large amount of stool and cannot exclude distal colon obstruction.  GI consult called and dr Randa Evens will see patient today.  Give fleets enema now per dr Randa Evens.  D/c pca.     OWENS,JAMES M 04/08/2012, 9:31 AM

## 2012-04-08 NOTE — Progress Notes (Signed)
Inpatient Diabetes Program Recommendations  AACE/ADA: New Consensus Statement on Inpatient Glycemic Control (2013)  Target Ranges:  Prepandial:   less than 140 mg/dL      Peak postprandial:   less than 180 mg/dL (1-2 hours)      Critically ill patients:  140 - 180 mg/dL   Results for Rodney Leach, Rodney Leach (MRN [id number]) as of 04/08/2012 11:03  Ref. Range 04/08/2012 05:30  Glucose Latest Range: 70-99 mg/dL 409 (H)   Patient with history of Type 2 diabetes per H&P.  Inpatient Diabetes Program Recommendations Correction (SSI): Please check CBGs and cover with Novolog Sensitive correction scale (SSi) Q4 hours while NPO.  Note: Will follow. Ambrose Finland RN, MSN, CDE Diabetes Coordinator Inpatient Diabetes Program [phone]

## 2012-04-08 NOTE — Progress Notes (Signed)
Pt c/o "zero" pain during night, did not use PCA Morphine since 2000 on 8/28. Per RN and wife report pt had eaten cookies in PACU, and also ate a large dinner once arrived on floor. At beginning of shift at 1930 last night pt c/o nausea. Zofran given and instructed to not eat anymore at this time to let his nausea settle. Pt did feel slightly better after this and was resting comfortably. He was offered to sit edge of bed or get up to the chair but refused. Zofran given once more around 0230 with some relief obtained. Around 0630 he began to c/o increased nausea, in which Reglan was given.  Offered again to get OOB but he refused and requested for his foley to stay in just until his nausea decreased. Abd xray ordered by Zonia Kief, PA on rounds for further assessment of nausea and abd pain. Pt then assisted to get up to chair with minimum assist, during movement he belched and vomited small amount of dark brown emesis. Foley dc'd, zofran given, and once in chair pt stated he felt slightly better after belching. Will continue to monitor. Events of night pass on to day shift.

## 2012-04-08 NOTE — Progress Notes (Signed)
Physical Therapy Treatment Patient Details Name: Rodney Leach MRN: [id number] DOB: 11-Apr-1961 Today's Date: 04/08/2012 Time: 4098-1191 PT Time Calculation (min): 17 min  PT Assessment / Plan / Recommendation Comments on Treatment Session  Session limited secondary to pt's c/o pain. Performed manual joint mobilizations (A-P glides ) for pain relief.  Placed pt in CPM with ice packs on L knee for pain relief.  RN medicated pt at conclusion of session.  Pt progressing well with mobility will attempt stairs tomorrow morning.     Follow Up Recommendations  Home health PT;Supervision - Intermittent    Barriers to Discharge        Equipment Recommendations  None recommended by PT    Recommendations for Other Services    Frequency 7X/week   Plan Discharge plan remains appropriate;Frequency remains appropriate    Precautions / Restrictions Precautions Precautions: Knee Precaution Booklet Issued: Yes (comment) Required Braces or Orthoses: Knee Immobilizer - Left Knee Immobilizer - Left: On except when in CPM Restrictions Weight Bearing Restrictions: Yes LLE Weight Bearing: Weight bearing as tolerated   Pertinent Vitals/Pain Pt c/o 8/10 pain in L Knee. See comments on treatment session.     Mobility  Bed Mobility Bed Mobility: Sit to Supine Sit to Supine: 4: Min assist Sit to Supine: Patient Percentage: 90% Details for Bed Mobility Assistance: Assist for L LE  Transfers Transfers: Stand to Sit;Sit to Stand Sit to Stand: 5: Supervision;From chair/3-in-1;With upper extremity assist Stand to Sit: 5: Supervision;To bed;With upper extremity assist Details for Transfer Assistance: Cueing for L LE positioning to minimize pain.  Ambulation/Gait Ambulation/Gait Assistance: Not tested (comment) Wheelchair Mobility Wheelchair Mobility: No    Exercises Total Joint Exercises Quad Sets: Left;Seated;10 reps Heel Slides: Left;10 reps;Supine Straight Leg Raises: 5  reps;Left;AAROM;Supine Goniometric ROM: 0-56   PT Diagnosis:    PT Problem List:   PT Treatment Interventions:     PT Goals Acute Rehab PT Goals PT Goal Formulation: With patient Time For Goal Achievement: 04/15/12 Potential to Achieve Goals: Good Pt will go Supine/Side to Sit: Independently PT Goal: Supine/Side to Sit - Progress: Progressing toward goal Pt will go Sit to Supine/Side: Independently;with HOB not 0 degrees (comment degree) PT Goal: Sit to Supine/Side - Progress: Progressing toward goal Pt will Transfer Bed to Chair/Chair to Bed: with modified independence PT Transfer Goal: Bed to Chair/Chair to Bed - Progress: Progressing toward goal Pt will Ambulate: >150 feet;with modified independence;with rolling walker Pt will Go Up / Down Stairs: 1-2 stairs;with supervision;with least restrictive assistive device Pt will Perform Home Exercise Program: Independently PT Goal: Perform Home Exercise Program - Progress: Progressing toward goal  Visit Information  Last PT Received On: 04/08/12 Assistance Needed: +1    Subjective Data  Subjective: I feel better than this morning but still not getting any pain medicine.  Patient Stated Goal: Return to [employer] job.   Cognition  Overall Cognitive Status: Appears within functional limits for tasks assessed/performed Arousal/Alertness: Awake/alert Orientation Level: Appears intact for tasks assessed Behavior During Session: [facility] for tasks performed    Balance  Balance Balance Assessed: No  End of Session PT - End of Session Equipment Utilized During Treatment: Gait belt;Left knee immobilizer Activity Tolerance: Patient tolerated treatment well Patient left: in bed;with call bell/phone within reach;in CPM Nurse Communication: Mobility status;Patient requests pain meds CPM Left Knee CPM Left Knee: Off   GP     Ebenezer Mccaskey 04/08/2012, 3:53 PM

## 2012-04-08 NOTE — Progress Notes (Signed)
ANTICOAGULATION CONSULT NOTE - Follow Up Consult  Pharmacy Consult for Coumadin Indication: VTE prophylaxis  Allergies  Allergen Reactions  . Sulfa Antibiotics Rash    Patient Measurements:   Heparin Dosing Weight:   Vital Signs: Temp: 98.5 F (36.9 C) (08/29 0621) BP: 159/91 mmHg (08/29 0621) Pulse Rate: 83  (08/29 0621)  Labs:  Basename 04/08/12 0530  HGB 13.3  HCT 39.7  PLT 285  APTT --  LABPROT 16.0*  INR 1.25  HEPARINUNFRC --  CREATININE 0.84  CKTOTAL --  CKMB --  TROPONINI --    CrCl is unknown because there is no height on file for the current visit.  Assessment: VTE prophylaxis s/p TKA. Baseline INR 1.08. INR today up to 1.25. CBC stable today. Patient also on Lovenox 30mg /12h until INR>2.  Infectious Disease: Ancef x 3. Afebrile. WBC up to 13.9. Cardiovascular: CAD, VSS. 159/91,83 on Lipitor Endocrinology: no hx, glucose ok Nephrology: Scr 0.84 Hematology / Oncology PTA Medication Issues: none, meds resumed Best Practices: Lovenox/Coumadin  Goal of Therapy:  INR 2-3 Monitor platelets by anticoagulation protocol: Yes   Plan:  Reduce Coumadin to 5mg  po x 1 today.  Merilynn Finland, [employer] 04/08/2012,10:57 AM

## 2012-04-08 NOTE — Progress Notes (Signed)
CARE MANAGEMENT NOTE 04/08/2012  Patient:  Rodney Leach, Rodney Leach   Account Number:  [id number]  Date Initiated:  04/08/2012  Documentation initiated by:  Vance Peper  Subjective/Objective Assessment:   51 yr old male s/p left total knee arthroplasty     Action/Plan:   Patient preoperatively setup with Gen tiva HC, no changes.DME has been delivered.   Anticipated DC Date:  04/10/2012   Anticipated DC Plan:  HOME W HOME HEALTH SERVICES      DC Planning Services  CM consult      [facility] Choice  HOME HEALTH   Choice offered to / List presented to:  C-1 Patient        HH arranged  HH-1 RN  HH-2 PT      [facility] agency  [facility]   Status of service:  Completed, signed off Medicare Important Message given?   (If response is "NO", the following Medicare IM given date fields will be blank) Date Medicare IM given:   Date Additional Medicare IM given:    Discharge Disposition:  HOME W HOME HEALTH SERVICES  Per UR Regulation:    If discussed at Long Length of Stay Meetings, dates discussed:    Comments:

## 2012-04-08 NOTE — Progress Notes (Signed)
[facility] CONSULT Reason for consult: Dilated colon Referring Physician: Dr. Eulah Leach. PCP Dr. Wyonia Leach is an 51 y.o. male.  HPI: Pleasant 51 year old who underwent left total knee replacement yesterday. He has had no bowel movement for 2-3 days. He notes that he tends to be somewhat constipated. He occasionally takes something at home but not very often. He had nausea and vomiting since last night this had minimal flatus. Exam showed distention and left-sided pain with KUB showing dilated loops of colon and small bowel with a large amount of stool. There was a question of distal obstruction. Discussed with PA and went ahead and gave fleets mineral enemas. Patient reports he feels much better after an enema and passing some stool. He is having flatus. Potassium 3.9 WBC 13.9 today.  Past Medical History  Diagnosis Date  . Stroke     "light" stroke  . Hypothyroidism   . Sleep apnea     CPAP  . Headache   . Arthritis   . Complication of anesthesia     has woken up during surgery before     Past Surgical History  Procedure Date  . Hand surgery     R & Leach   . Total knee arthroplasty 2012    Right  . Back surgery     fusion L5/S1  . Hernia repair     umbilical  . Nose surgery     fracture repair  . Multiple tooth extractions     History reviewed. No pertinent family history.  Social History:  does not have a smoking history on file. He does not have any smokeless tobacco history on file. He reports that he does not drink alcohol or use illicit drugs.  Allergies:  Allergies  Allergen Reactions  . Sulfa Antibiotics Rash    Medications;    . atorvastatin  10 mg Oral Daily  .  ceFAZolin (ANCEF) IV  1 g Intravenous Q8H  . docusate sodium  100 mg Oral BID  . enoxaparin (LOVENOX) injection  30 mg Subcutaneous Q12H  . levothyroxine  175 mcg Oral QAC breakfast  . mineral oil  1 enema Rectal Once  . morphine      . patient's guide to using coumadin  book   Does not apply Once  . warfarin  5 mg Oral ONCE-1800  . warfarin   Does not apply Once  . Warfarin - Pharmacist Dosing Inpatient   Does not apply q1800  . DISCONTD: morphine   Intravenous Q4H  . DISCONTD: warfarin  10 mg Oral q1800   PRN Meds acetaminophen, acetaminophen, alum & mag hydroxide-simeth, menthol-cetylpyridinium, methocarbamol (ROBAXIN) IV, methocarbamol, metoCLOPramide (REGLAN) injection, metoCLOPramide, ondansetron, phenol, senna-docusate, DISCONTD: diphenhydrAMINE, DISCONTD: diphenhydrAMINE, DISCONTD: fentaNYL, DISCONTD:  HYDROmorphone (DILAUDID) injection, DISCONTD: naloxone, DISCONTD: ondansetron (ZOFRAN) IV, DISCONTD: ondansetron (ZOFRAN) IV DISCONTD: ondansetron (ZOFRAN) IV, DISCONTD: sodium chloride Results for orders placed during the hospital encounter of 04/07/12 (from the past 48 hour(s))  PROTIME-INR     Status: Abnormal   Collection Time   04/08/12  5:30 AM      Component Value Range Comment   Prothrombin Time 16.0 (*) 11.6 - 15.2 seconds    INR 1.25  0.00 - 1.49   CBC     Status: Abnormal   Collection Time   04/08/12  5:30 AM      Component Value Range Comment   WBC 13.9 (*) 4.0 - 10.5 K/uL    RBC 4.40  4.22 - 5.81 MIL/uL  Hemoglobin 13.3  13.0 - 17.0 g/dL    HCT 16.1  09.6 - 04.5 %    MCV 90.2  78.0 - 100.0 fL    MCH 30.2  26.0 - 34.0 pg    MCHC 33.5  30.0 - 36.0 g/dL    RDW 40.9  81.1 - 91.4 %    Platelets 285  150 - 400 K/uL   BASIC METABOLIC PANEL     Status: Abnormal   Collection Time   04/08/12  5:30 AM      Component Value Range Comment   Sodium 133 (*) 135 - 145 mEq/Leach    Potassium 3.9  3.5 - 5.1 mEq/Leach    Chloride 99  96 - 112 mEq/Leach    CO2 25  19 - 32 mEq/Leach    Glucose, Bld 202 (*) 70 - 99 mg/dL    BUN 13  6 - 23 mg/dL    Creatinine, Ser 7.82  0.50 - 1.35 mg/dL    Calcium 8.8  8.4 - 95.6 mg/dL    GFR calc non Af Amer >90  >90 mL/min    GFR calc Af Amer >90  >90 mL/min     X-ray Knee Left Port  04/07/2012  *RADIOLOGY REPORT*   Clinical Data: Postop.  PORTABLE LEFT KNEE - 1-2 VIEW  Comparison: None.  Findings: Postoperative changes of left knee arthroplasty. Subcutaneous and joint air and fluid are present.  Surgical drain and skin staples are in place.  IMPRESSION: Left knee arthroplasty with expected postoperative findings.   Original Report Authenticated By: Rodney Leach, M.D.    Dg Abd Portable 1v  04/08/2012  *RADIOLOGY REPORT*  Clinical Data: Nausea and vomiting  PORTABLE ABDOMEN - 1 VIEW  Comparison: No CT 03/04/2011  Findings: There are gas filled loops of colon which are at the upper limits of normal at 9 cm.  There is stool throughout the transverse colon.  No dilated loops of small bowel are present. Posterior lumbar fusion noted.  No pathologic calcifications.  IMPRESSION:  1.  Gas-filled loops of colon at the upper limits of normal. Moderate volume stool throughout the colon.  Cannot exclude a distal obstruction of the colon. 2.  No evidence of small bowel obstruction.   Original Report Authenticated By: Rodney Leach, M.D.    ROS: Constitutional: He does have history of sleep apnea HEENT: Cardiovascular: History of light stroke in the past no clear cardiac history Respiratory: GI: Patient had colonoscopy 8 or 10 years ago. No known history of polyps her family history. No history of ulcers. GU: Musculoskeletal: Neuro/Psychiatric: Endocrine/Heme:            Blood pressure 148/66, pulse 86, temperature 98.7 F (37.1 C), temperature source Oral, resp. rate 18, SpO2 100.00%.  Physical exam:   Gen.-somewhat obese white male in no acute distress alert and oriented Heart-regular rate and rhythm no murmurs or gallops Lungs-clear Abdomen-soft nondistended nontender with few bowel sounds.  Assessment: 1. Postoperative ileus/constipation. Appears to have improved somewhat with the enemas. The potassium is on the low side and his gut function may improve with a potassium of 4.5-5.0.  Plan: We  will resume clear liquids and oral pain medicines and other oral medications. Will give him some additional potassium can attempt to keep the potassium up to 4.5-5 and we'll give MiraLAX. Hopefully this will all resolve the problem.   Rodney Leach,Rodney Leach 04/08/2012, 2:59 PM

## 2012-04-08 NOTE — Evaluation (Signed)
Physical Therapy Evaluation Patient Details Name: Rodney Leach MRN: [id number] DOB: October 16, 1960 Today's Date: 04/08/2012 Time: 6213-0865 PT Time Calculation (min): 18 min  PT Assessment / Plan / Recommendation Clinical Impression  Pt is a 51 y/o male s/p L TKA.  Pt fatigues quickly.  Acute will follow pt to maximize functional mobility . Pt awaiting GI consult.      PT Assessment  Patient needs continued PT services    Follow Up Recommendations  Home health PT;Supervision - Intermittent    Barriers to Discharge None      Equipment Recommendations  None recommended by PT    Recommendations for Other Services     Frequency 7X/week    Precautions / Restrictions Precautions Precautions: Knee Precaution Booklet Issued: Yes (comment) Required Braces or Orthoses: Knee Immobilizer - Left Knee Immobilizer - Left: On except when in CPM Restrictions Weight Bearing Restrictions: Yes LLE Weight Bearing: Weight bearing as tolerated Other Position/Activity Restrictions: No pillow under L knee   Pertinent Vitals/Pain Pt reports pain in L knee 7/10.  Applied ice to knee and notified RN.       Mobility  Bed Mobility Bed Mobility: Sit to Supine Sit to Supine: 4: Min assist Sit to Supine: Patient Percentage: 90% Details for Bed Mobility Assistance: Assist for L LE  Transfers Transfers: Stand to Sit;Sit to Stand Sit to Stand: 5: Supervision;From chair/3-in-1;With upper extremity assist Stand to Sit: 5: Supervision;To bed;With upper extremity assist Details for Transfer Assistance: Cueing for L LE positioning to minimize pain.  Ambulation/Gait Ambulation/Gait Assistance: 5: Supervision Ambulation Distance (Feet): 80 Feet Assistive device: Rolling walker Ambulation/Gait Assistance Details: Cues for gait sequencing, WBAT on L LE and proper distancing from RW>  Gait Pattern: Step-to pattern Gait velocity: WFL Stairs: No Wheelchair Mobility Wheelchair Mobility: No    Exercises  Total Joint Exercises Ankle Circles/Pumps: Both;10 reps;Seated Quad Sets: Left;Seated;10 reps   PT Diagnosis: Difficulty walking;Acute pain  PT Problem List: Decreased strength;Decreased range of motion;Decreased mobility;Pain;Decreased activity tolerance PT Treatment Interventions: Gait training;DME instruction;Stair training;Functional mobility training;Therapeutic activities;Therapeutic exercise;Patient/family education;Manual techniques   PT Goals Acute Rehab PT Goals PT Goal Formulation: With patient Time For Goal Achievement: 04/15/12 Potential to Achieve Goals: Good Pt will go Supine/Side to Sit: Independently PT Goal: Supine/Side to Sit - Progress: Goal set today Pt will go Sit to Supine/Side: Independently;with HOB not 0 degrees (comment degree) PT Goal: Sit to Supine/Side - Progress: Goal set today Pt will Transfer Bed to Chair/Chair to Bed: with modified independence PT Transfer Goal: Bed to Chair/Chair to Bed - Progress: Goal set today Pt will Ambulate: >150 feet;with modified independence;with rolling walker PT Goal: Ambulate - Progress: Goal set today Pt will Go Up / Down Stairs: 1-2 stairs;with supervision;with least restrictive assistive device PT Goal: Up/Down Stairs - Progress: Goal set today Pt will Perform Home Exercise Program: Independently PT Goal: Perform Home Exercise Program - Progress: Goal set today  Visit Information  Last PT Received On: 04/08/12 Assistance Needed: +1    Subjective Data  Subjective: I can't have any pain medicine until after I see the GI doctor.   Patient Stated Goal: Return to [employer] job.   Prior Functioning  Home Living Lives With: Spouse;Daughter;Family Available Help at Discharge: Family Type of Home: House Home Access: Stairs to enter Secretary/administrator of Steps: 1 Entrance Stairs-Rails: None Home Layout: One level Bathroom Shower/Tub: Walk-in shower;Door [employer] Toilet: Standard Bathroom Accessibility:  Yes How Accessible: Accessible via walker Home Adaptive Equipment: Bedside commode/3-in-1;Crutches;Shower chair  with back;Straight cane;Walker - rolling Additional Comments: has reacher and AE from previous surgeries Prior Function Level of Independence: Independent Able to Take Stairs?: Yes Driving: Yes Vocation: Full time employment Communication Communication: No difficulties Dominant Hand: Right    Cognition  Overall Cognitive Status: Appears within functional limits for tasks assessed/performed Arousal/Alertness: Awake/alert Orientation Level: Appears intact for tasks assessed Behavior During Session: [facility] for tasks performed    Extremity/Trunk Assessment Right Upper Extremity Assessment RUE ROM/Strength/Tone: Within functional levels Left Upper Extremity Assessment LUE ROM/Strength/Tone: Within functional levels Right Lower Extremity Assessment RLE ROM/Strength/Tone: Within functional levels Left Lower Extremity Assessment LLE ROM/Strength/Tone: Deficits LLE ROM/Strength/Tone Deficits: Unable to assess ROM secondary dried blood in dressing limiting ROM.  Trunk Assessment Trunk Assessment: Normal   Balance Balance Balance Assessed: No  End of Session PT - End of Session Equipment Utilized During Treatment: Gait belt;Left knee immobilizer Activity Tolerance: Patient tolerated treatment well Patient left: in bed;with call bell/phone within reach Nurse Communication: Mobility status;Patient requests pain meds CPM Left Knee CPM Left Knee: Off  GP     Sarie Stall 04/08/2012, 1:18 PM  Kaedin Hicklin L. Jshon Ibe DPT [phone]

## 2012-04-08 NOTE — Progress Notes (Signed)
UR COMPLETED  

## 2012-04-09 LAB — BASIC METABOLIC PANEL
CO2: 27 mEq/L (ref 19–32)
Calcium: 8.6 mg/dL (ref 8.4–10.5)
GFR calc Af Amer: >90 mL/min (ref >90)
GFR calc non Af Amer: >90 mL/min (ref >90)
Sodium: 134 mEq/L — ABNORMAL LOW (ref 135–145)

## 2012-04-09 LAB — CBC
MCH: 29.6 pg (ref 26.0–34.0)
Platelets: 235 10*3/uL (ref 150–400)
RBC: 3.92 MIL/uL — ABNORMAL LOW (ref 4.22–5.81)
RDW: 13.8 % (ref 11.5–15.5)

## 2012-04-09 LAB — PROTIME-INR: Prothrombin Time: 17.3 seconds — ABNORMAL HIGH (ref 11.6–15.2)

## 2012-04-09 MED ORDER — OXYCODONE-ACETAMINOPHEN 10-325 MG PO TABS: 1.0000 | ORAL_TABLET | ORAL | Status: DC | PRN

## 2012-04-09 MED ORDER — WARFARIN SODIUM 10 MG PO TABS
10.0000 mg | ORAL_TABLET | Freq: Once | ORAL | Status: DC
  Filled 2012-04-09: qty 1

## 2012-04-09 MED ORDER — METHOCARBAMOL 500 MG PO TABS: 500.0000 mg | ORAL_TABLET | Freq: Four times a day (QID) | ORAL | Status: DC | PRN

## 2012-04-09 MED ORDER — ENOXAPARIN SODIUM 30 MG/0.3ML ~~LOC~~ SOLN: 30.0000 mg | Freq: Two times a day (BID) | SUBCUTANEOUS | Status: DC

## 2012-04-09 MED ORDER — WARFARIN SODIUM 10 MG PO TABS: 5.0000 mg | ORAL_TABLET | Freq: Once | ORAL | Status: DC

## 2012-04-09 NOTE — Progress Notes (Signed)
Physical Therapy Treatment Patient Details Name: Rodney Leach MRN: [id number] DOB: [birth year] Today's Date: 04/09/2012 Time: 8657-8469 PT Time Calculation (min): 29 min  PT Assessment / Plan / Recommendation Comments on Treatment Session  Reviewed technique for stair negotiation with pt.  Pt reporting no need to practice as he remembered from prior surgeries.  Pt able to verbalize technique without prompting. Added all ther ex performed today to HEP.  Pt provided with handout and instruction for HEP.      Follow Up Recommendations  Home health PT;Supervision - Intermittent    Barriers to Discharge        Equipment Recommendations  None recommended by PT    Recommendations for Other Services    Frequency 7X/week   Plan All goals met and education completed, patient dischaged from PT services    Precautions / Restrictions Precautions Precautions: Knee Precaution Booklet Issued: Yes (comment) Required Braces or Orthoses: Knee Immobilizer - Left Knee Immobilizer - Left: On except when in CPM Restrictions Weight Bearing Restrictions: Yes LLE Weight Bearing: Weight bearing as tolerated Other Position/Activity Restrictions: No pillow under L knee   Pertinent Vitals/Pain 6/10 pain in L knee.  Applied ice pack to knee and notified RN.      Mobility  Bed Mobility Bed Mobility: Sit to Supine;Supine to Sit Supine to Sit: 7: Independent Sit to Supine: 7: Independent Transfers Transfers: Stand to Sit;Sit to Stand Sit to Stand: 6: Modified independent (Device/Increase time);From bed;From chair/3-in-1;With upper extremity assist Stand to Sit: 6: Modified independent (Device/Increase time);To bed;To chair/3-in-1;With upper extremity assist Details for Transfer Assistance: No instruction or assistance required.  Ambulation/Gait Ambulation/Gait Assistance: Not tested (comment)    Exercises Total Joint Exercises Ankle Circles/Pumps: Both;10 reps;Seated Quad Sets:  Strengthening;Left;Supine;10 reps Short Arc Quad: Left;10 reps;Supine;Strengthening;AROM Heel Slides: Left;10 reps;Supine Hip ABduction/ADduction: Left;10 reps;Supine;AROM;Strengthening Straight Leg Raises: 10 reps;Supine;Left;AROM;Strengthening Long Arc Quad: 10 reps;AROM;Strengthening;Left;Seated Knee Flexion: Left;AROM;Seated;Other reps (comment) (10 second hold x 3 ) Goniometric ROM: 0-80 AROM in sitting.    PT Diagnosis:    PT Problem List:   PT Treatment Interventions:     PT Goals Acute Rehab PT Goals PT Goal Formulation: With patient Time For Goal Achievement: 04/15/12 Potential to Achieve Goals: Good Pt will go Supine/Side to Sit: Independently PT Goal: Supine/Side to Sit - Progress: Met Pt will go Sit to Supine/Side: Independently;with HOB not 0 degrees (comment degree) PT Goal: Sit to Supine/Side - Progress: Met Pt will Transfer Bed to Chair/Chair to Bed: with modified independence PT Transfer Goal: Bed to Chair/Chair to Bed - Progress: Met Pt will Go Up / Down Stairs: 1-2 stairs;with supervision;with least restrictive assistive device PT Goal: Up/Down Stairs - Progress: Met Pt will Perform Home Exercise Program: Independently PT Goal: Perform Home Exercise Program - Progress: Met  Visit Information  Last PT Received On: 04/09/12 Assistance Needed: +1    Subjective Data  Subjective: My pain is much better Patient Stated Goal: Return to [employer] job.   Cognition  Overall Cognitive Status: Appears within functional limits for tasks assessed/performed Arousal/Alertness: Awake/alert Orientation Level: Appears intact for tasks assessed Behavior During Session: [facility] for tasks performed    Balance     End of Session PT - End of Session Equipment Utilized During Treatment: Gait belt;Left knee immobilizer Activity Tolerance: Patient tolerated treatment well Patient left: in chair;with call bell/phone within reach;with family/visitor present Nurse Communication:  Mobility status;Patient requests pain meds CPM Left Knee CPM Left Knee: Off   GP  Nashawn Hillock 04/09/2012, 1:58 PM Ariam Mol L. Gertrue Willette DPT [phone]

## 2012-04-09 NOTE — Progress Notes (Addendum)
Physical Therapy Treatment Patient Details Name: JOREL GRAVLIN MRN: [id number] DOB: [birth year] Today's Date: 04/09/2012 Time: 0952-1005 PT Time Calculation (min): 13 min  PT Assessment / Plan / Recommendation Comments on Treatment Session  Pt doing well will see once more to review HEP    Follow Up Recommendations  Home health PT;Supervision - Intermittent    Barriers to Discharge        Equipment Recommendations  None recommended by PT    Recommendations for Other Services    Frequency 7X/week   Plan Discharge plan remains appropriate;Frequency remains appropriate    Precautions / Restrictions Precautions Precautions: Knee Precaution Booklet Issued: Yes (comment) Required Braces or Orthoses: Knee Immobilizer - Left Knee Immobilizer - Left: On except when in CPM Restrictions Weight Bearing Restrictions: Yes LLE Weight Bearing: Weight bearing as tolerated   Pertinent Vitals/Pain Pt reporting pain in L knee 6/10.  Premedicated.     Mobility  Bed Mobility Bed Mobility: Not assessed Transfers Transfers: Stand to Sit;Sit to Stand Sit to Stand: 6: Modified independent (Device/Increase time);From chair/3-in-1 Stand to Sit: 6: Modified independent (Device/Increase time);To chair/3-in-1 Ambulation/Gait Ambulation/Gait Assistance: 6: Modified independent (Device/Increase time) Ambulation Distance (Feet): 150 Feet Assistive device: Rolling walker Ambulation/Gait Assistance Details: Instructed pt in ambulation with reciprocal gait.  Gait Pattern: Step-to pattern;Step-through pattern;Decreased dorsiflexion - left Gait velocity: WFL Stairs: No Wheelchair Mobility Wheelchair Mobility: No    Exercises     PT Diagnosis:    PT Problem List:   PT Treatment Interventions:     PT Goals Acute Rehab PT Goals PT Goal Formulation: With patient Time For Goal Achievement: 04/15/12 Potential to Achieve Goals: Good Pt will go Supine/Side to Sit: Independently PT Goal: Supine/Side  to Sit - Progress: Progressing toward goal Pt will go Sit to Supine/Side: Independently;with HOB not 0 degrees (comment degree) PT Goal: Sit to Supine/Side - Progress: Progressing toward goal Pt will Transfer Bed to Chair/Chair to Bed: with modified independence PT Transfer Goal: Bed to Chair/Chair to Bed - Progress: Met Pt will Ambulate: >150 feet;with modified independence;with rolling walker PT Goal: Ambulate - Progress: Met Pt will Go Up / Down Stairs: 1-2 stairs;with supervision;with least restrictive assistive device Pt will Perform Home Exercise Program: Independently  Visit Information  Last PT Received On: 04/09/12 Assistance Needed: +1    Subjective Data  Subjective: I feel better. Getting pain meds and bowels are moving.  Patient Stated Goal: Return to [employer] job.   Cognition  Overall Cognitive Status: Appears within functional limits for tasks assessed/performed Arousal/Alertness: Awake/alert Orientation Level: Appears intact for tasks assessed Behavior During Session: [facility] for tasks performed    Balance  Balance Balance Assessed: No  End of Session PT - End of Session Equipment Utilized During Treatment: Gait belt;Left knee immobilizer Activity Tolerance: Patient tolerated treatment well Patient left: in bed;with call bell/phone within reach;in CPM Nurse Communication: Mobility status;Patient requests pain meds CPM Left Knee CPM Left Knee: Off   GP     Jerl Munyan 04/09/2012, 12:34 PM Dannika Hilgeman L. Nataleah Scioneaux DPT [phone]

## 2012-04-09 NOTE — Progress Notes (Signed)
ANTICOAGULATION CONSULT NOTE - Follow Up Consult  Pharmacy Consult for Coumadin Indication: VTE prophylaxis  Allergies  Allergen Reactions  . Sulfa Antibiotics Rash    Patient Measurements:    Weight: 154 kg  Vital Signs: Temp: 98.7 F (37.1 C) (08/30 0600) BP: 140/66 mmHg (08/30 0600) Pulse Rate: 82  (08/30 0600)  Labs:  Basename 04/09/12 0650 04/08/12 0530  HGB 11.6* 13.3  HCT 35.4* 39.7  PLT 235 285  APTT -- --  LABPROT 17.3* 16.0*  INR 1.39 1.25  HEPARINUNFRC -- --  CREATININE 0.88 0.84  CKTOTAL -- --  CKMB -- --  TROPONINI -- --    CrCl is unknown because there is no height on file for the current visit.  Assessment: VTE prophylaxis s/p TKA. Baseline INR 1.08. INR today is 1.39 after 10 mg and 5 mg doses . H/H down to 11.6/35/4 from 13.3/39.7.   Patient also on Lovenox 30mg /12h until INR>2.  No bleeding reported Post-op ileus/constipation resolving. IGoal of Therapy:  INR 2-3   Plan:  Coumadin 10 mg po x 1 dose today Herby Abraham, Pharm.D. [phone] 04/09/2012 10:48 AM

## 2012-04-09 NOTE — Progress Notes (Signed)
[facility] PROGRESS NOTE Subjective Pt tolerating CLs, + flatus and BM  Objective: Vital signs in last 24 hours: Temp:  [98.5 F (36.9 C)-98.7 F (37.1 C)] 98.7 F (37.1 C) (08/30 0600) Pulse Rate:  [82-86] 82  (08/30 0600) Resp:  [16-18] 18  (08/30 0600) BP: (140-148)/(66-68) 140/66 mmHg (08/30 0600) SpO2:  [96 %-100 %] 98 % (08/30 0600) Last BM Date: 04/06/12 (enema was given per report this am)  Intake/Output from previous day: 08/29 0701 - 08/30 0700 In: 2766.3 [P.O.:480; I.V.:2286.3] Out: 650 [Urine:600; Drains:50] Intake/Output this shift: Total I/O In: 2526.3 [P.O.:240; I.V.:2286.3] Out: 650 [Urine:600; Drains:50]  PE: Gen-on bedside toilet having BM Abd-+BSs  Lab Results:  Basename 04/08/12 0530  WBC 13.9*  HGB 13.3  HCT 39.7  PLT 285   BMET  Basename 04/08/12 0530  NA 133*  K 3.9  CL 99  CO2 25  CREATININE 0.84   LFT No results found for this basename: PROT:3ALBUMIN:3,AST:3,ALT:3,ALKPHOS:3,BILITOT:3,BILIDIR:3,IBILI:3 in the last 72 hours PT/INR  Basename 04/08/12 0530  LABPROT 16.0*  INR 1.25   PANCREAS No results found for this basename: LIPASE:3 in the last 72 hours       Studies/Results: X-ray Knee Left Port  04/07/2012  *RADIOLOGY REPORT*  Clinical Data: Postop.  PORTABLE LEFT KNEE - 1-2 VIEW  Comparison: None.  Findings: Postoperative changes of left knee arthroplasty. Subcutaneous and joint air and fluid are present.  Surgical drain and skin staples are in place.  IMPRESSION: Left knee arthroplasty with expected postoperative findings.   Original Report Authenticated By: Reyes Ivan, M.D.    Dg Abd Portable 1v  04/08/2012  *RADIOLOGY REPORT*  Clinical Data: Nausea and vomiting  PORTABLE ABDOMEN - 1 VIEW  Comparison: No CT 03/04/2011  Findings: There are gas filled loops of colon which are at the upper limits of normal at 9 cm.  There is stool throughout the transverse colon.  No dilated loops of small bowel are  present. Posterior lumbar fusion noted.  No pathologic calcifications.  IMPRESSION:  1.  Gas-filled loops of colon at the upper limits of normal. Moderate volume stool throughout the colon.  Cannot exclude a distal obstruction of the colon. 2.  No evidence of small bowel obstruction.   Original Report Authenticated By: Genevive Bi, M.D.     Medications: I have reviewed the patient's current medications.  Assessment/Plan: 1.Post Op Ileus. Appears to be resolving. Would advance diet and send home on Miralax pt to adjust the dose. Labs pending. Please call us for problems.   Kienna Moncada JR,Remonia Otte L 04/09/2012, 7:00 AM

## 2012-04-09 NOTE — Progress Notes (Signed)
Subjective: Feeling much better today.  N/v resolved.  abd discomfort greatly improved.  had a good bowel movement  Wants to go home today.  Progressing well with therapy.   Objective: Vital signs in last 24 hours: Temp:  [98.5 F (36.9 C)-98.7 F (37.1 C)] 98.7 F (37.1 C) (08/30 0600) Pulse Rate:  [82-86] 82  (08/30 0600) Resp:  [16-18] 16  (08/30 0800) BP: (140-148)/(66-68) 140/66 mmHg (08/30 0600) SpO2:  [98 %-100 %] 99 % (08/30 0800)  Intake/Output from previous day: 08/29 0701 - 08/30 0700 In: 2766.3 [P.O.:480; I.V.:2286.3] Out: 650 [Urine:600; Drains:50] Intake/Output this shift: Total I/O In: 240 [P.O.:240] Out: -    Basename 04/09/12 0650 04/08/12 0530  HGB 11.6* 13.3    Basename 04/09/12 0650 04/08/12 0530  WBC 14.1* 13.9*  RBC 3.92* 4.40  HCT 35.4* 39.7  PLT 235 285    Basename 04/09/12 0650 04/08/12 0530  NA 134* 133*  K 4.6 3.9  CL 102 99  CO2 27 25  BUN 11 13  CREATININE 0.88 0.84  GLUCOSE 115* 202*  CALCIUM 8.6 8.8    Basename 04/09/12 0650 04/08/12 0530  LABPT -- --  INR 1.39 1.25    Exam:  Knee wound looks good.  Staples intact.  No drainage or signs of infection.  Calf nt, nvi.  Left abdomen min tender.    Assessment/Plan: Discharge home today after works with therapy.  F/u 2 weeks postop.   Merlin Golden M 04/09/2012, 11:39 AM

## 2012-04-09 NOTE — Progress Notes (Signed)
Late entry : Referral received for SNF. Chart reviewed and CSW has spoken with RNCM who indicates that patient is for DC to home with Home Health and DME.  CSW to sign off. Please re-consult if CSW needs arise.  Lorri Frederick. [place]  [phone]

## 2012-04-16 ENCOUNTER — Emergency Department (HOSPITAL_COMMUNITY): Payer: Medicaid Other

## 2012-04-16 ENCOUNTER — Emergency Department (HOSPITAL_COMMUNITY)
Admission: EM | Admit: 2012-04-16 | Discharge: 2012-04-16 | Disposition: A | Discharge status: Home / Self Care | Payer: Medicaid Other | Attending: Emergency Medicine | Admitting: Emergency Medicine

## 2012-04-16 ENCOUNTER — Encounter (HOSPITAL_COMMUNITY): Payer: Self-pay | Admitting: Family Medicine

## 2012-04-16 DIAGNOSIS — L03119 Cellulitis of unspecified part of limb: Secondary | ICD-10-CM | POA: Insufficient documentation

## 2012-04-16 DIAGNOSIS — R110 Nausea: Secondary | ICD-10-CM | POA: Insufficient documentation

## 2012-04-16 DIAGNOSIS — K625 Hemorrhage of anus and rectum: Secondary | ICD-10-CM

## 2012-04-16 DIAGNOSIS — K5900 Constipation, unspecified: Secondary | ICD-10-CM | POA: Insufficient documentation

## 2012-04-16 DIAGNOSIS — L02419 Cutaneous abscess of limb, unspecified: Secondary | ICD-10-CM | POA: Insufficient documentation

## 2012-04-16 DIAGNOSIS — Z96659 Presence of unspecified artificial knee joint: Secondary | ICD-10-CM | POA: Insufficient documentation

## 2012-04-16 DIAGNOSIS — L0390 Cellulitis, unspecified: Secondary | ICD-10-CM

## 2012-04-16 DIAGNOSIS — R1032 Left lower quadrant pain: Secondary | ICD-10-CM | POA: Insufficient documentation

## 2012-04-16 DIAGNOSIS — R51 Headache: Secondary | ICD-10-CM | POA: Insufficient documentation

## 2012-04-16 DIAGNOSIS — F17200 Nicotine dependence, unspecified, uncomplicated: Secondary | ICD-10-CM | POA: Insufficient documentation

## 2012-04-16 DIAGNOSIS — R42 Dizziness and giddiness: Secondary | ICD-10-CM | POA: Insufficient documentation

## 2012-04-16 DIAGNOSIS — Z8673 Personal history of transient ischemic attack (TIA), and cerebral infarction without residual deficits: Secondary | ICD-10-CM | POA: Insufficient documentation

## 2012-04-16 DIAGNOSIS — Z79899 Other long term (current) drug therapy: Secondary | ICD-10-CM | POA: Insufficient documentation

## 2012-04-16 LAB — CBC WITH DIFFERENTIAL/PLATELET
Basophils Relative: 1 % (ref 0–1)
Eosinophils Absolute: 0.5 10*3/uL (ref 0.0–0.7)
HCT: 37.4 % — ABNORMAL LOW (ref 39.0–52.0)
Hemoglobin: 12.9 g/dL — ABNORMAL LOW (ref 13.0–17.0)
MCH: 30.7 pg (ref 26.0–34.0)
MCHC: 34.5 g/dL (ref 30.0–36.0)
Monocytes Absolute: 1.1 10*3/uL — ABNORMAL HIGH (ref 0.1–1.0)
Monocytes Relative: 10 % (ref 3–12)
RDW: 13.9 % (ref 11.5–15.5)

## 2012-04-16 LAB — COMPREHENSIVE METABOLIC PANEL
Albumin: 3.2 g/dL — ABNORMAL LOW (ref 3.5–5.2)
BUN: 14 mg/dL (ref 6–23)
Calcium: 10.6 mg/dL — ABNORMAL HIGH (ref 8.4–10.5)
Creatinine, Ser: 0.93 mg/dL (ref 0.50–1.35)
Total Protein: 7.3 g/dL (ref 6.0–8.3)

## 2012-04-16 LAB — PROTIME-INR: INR: 2.27 — ABNORMAL HIGH (ref 0.00–1.49)

## 2012-04-16 LAB — URINALYSIS, ROUTINE W REFLEX MICROSCOPIC
Glucose, UA: NEGATIVE mg/dL
Leukocytes, UA: NEGATIVE
Protein, ur: NEGATIVE mg/dL
Specific Gravity, Urine: 1.02 (ref 1.005–1.030)
pH: 6 (ref 5.0–8.0)

## 2012-04-16 LAB — URINE MICROSCOPIC-ADD ON

## 2012-04-16 LAB — OCCULT BLOOD, POC DEVICE: Fecal Occult Bld: POSITIVE

## 2012-04-16 MED ORDER — HYDROMORPHONE HCL PF 1 MG/ML IJ SOLN
1.0000 mg | INTRAMUSCULAR | Status: AC
  Administered 2012-04-16: 1 mg via INTRAVENOUS
  Filled 2012-04-16: qty 1

## 2012-04-16 MED ORDER — FLEET ENEMA 7-19 GM/118ML RE ENEM
1.0000 | ENEMA | Freq: Once | RECTAL | Status: AC
  Administered 2012-04-16: 1 via RECTAL
  Filled 2012-04-16: qty 1

## 2012-04-16 MED ORDER — MORPHINE SULFATE 4 MG/ML IJ SOLN
4.0000 mg | Freq: Once | INTRAMUSCULAR | Status: AC
  Administered 2012-04-16: 4 mg via INTRAVENOUS
  Filled 2012-04-16: qty 1

## 2012-04-16 MED ORDER — IOHEXOL 300 MG/ML  SOLN
80.0000 mL | Freq: Once | INTRAMUSCULAR | Status: AC | PRN
  Administered 2012-04-16: 80 mL via INTRAVENOUS

## 2012-04-16 MED ORDER — SODIUM CHLORIDE 0.9 % IV BOLUS (SEPSIS)
1000.0000 mL | Freq: Once | INTRAVENOUS | Status: AC
  Administered 2012-04-16: 1000 mL via INTRAVENOUS

## 2012-04-16 MED ORDER — LACTULOSE 10 GM/15ML PO SOLN
20.0000 g | Freq: Once | ORAL | Status: AC
  Administered 2012-04-16: 20 g via ORAL
  Filled 2012-04-16: qty 30

## 2012-04-16 MED ORDER — CEPHALEXIN 500 MG PO CAPS: 500.0000 mg | ORAL_CAPSULE | Freq: Four times a day (QID) | ORAL | Status: AC

## 2012-04-16 MED ORDER — IOHEXOL 300 MG/ML  SOLN
20.0000 mL | INTRAMUSCULAR | Status: AC
  Administered 2012-04-16 (×2): 20 mL via ORAL

## 2012-04-16 MED ORDER — MAGNESIUM CITRATE PO SOLN: 296.0000 mL | Freq: Once | ORAL | Status: AC

## 2012-04-16 NOTE — ED Notes (Signed)
Pt sts hasn't had a normal BM since Sunday the 1st. sts last one he had was black and tarry. Home health nurse came to check on him and couldn't hear bowel sounds. Associated with nausea, HA, increased BP. Recent knee surgery

## 2012-04-16 NOTE — ED Provider Notes (Signed)
History     CSN: [id number]  Arrival date & time 04/16/12  1050   First MD Initiated Contact with Patient 04/16/12 1107      Chief Complaint  Patient presents with  . Constipation    (Consider location/radiation/quality/duration/timing/severity/associated sxs/prior treatment) HPI Comments: Patient presents with constipation, mild abdominal distension, occasional LLQ abdominal pain, nausea, indigestion, and increased belching. Patient had knee replacement surgery 04/07/12 and was put on Percocet for the pain.  Patient was seen by GI after his surgery and reports he was told that he had a left bowel impaction and was put on Miralax twice daily and a stool softener. He has had one BM since his surgery, the BM was 5 days ago, patient describes it as a "jet black", "tarry" loose stool that took place over several hours in the morning, but states that it was a very small amount. He has passed gas twice since his surgery, most recently last night. He has been nauseous and having indigestion since the surgery. He started using suppositories 3-4 days ago, but has still not had a BM. Patient notes some left lower abdominal tenderness that comes and goes. He c/o night sweats over the past 10 days. Has also had occasional dizziness and headaches.  The amount he is urinating has increased.  Patient is being seen by a home health nurse who listened to his abdomen today and could not appreciate bowel sounds on the left and told him to go to the ED. He is eating and drinking normally. He denies having problems with BMs in the past. Patient has h/o umbilical hernia repair years ago.  He denies CP, SOB, fevers, vomiting.  Reports left knee is healing well following the surgery.  No discharge from wound.    Patient is a 51 y.o. male presenting with constipation. The history is provided by the patient.  Constipation  Associated symptoms include abdominal pain, nausea and headaches. Pertinent negatives include no fever  and no vomiting.    Past Medical History  Diagnosis Date  . Stroke     "light" stroke  . Hypothyroidism   . Sleep apnea     CPAP  . Headache   . Arthritis   . Complication of anesthesia     has woken up during surgery before     Past Surgical History  Procedure Date  . Hand surgery     R & L   . Total knee arthroplasty 2012    Right  . Back surgery     fusion L5/S1  . Hernia repair     umbilical  . Nose surgery     fracture repair  . Multiple tooth extractions   . Total knee arthroplasty 04/07/2012    Procedure: TOTAL KNEE ARTHROPLASTY;  Surgeon: Loreta Ave, MD;  Location: [facility] OR;  Service: Orthopedics;  Laterality: Left;  left total knee arthroplasty    History reviewed. No pertinent family history.  History  Substance Use Topics  . Smoking status: Current Everyday Smoker -- 0.5 packs/day  . Smokeless tobacco: Not on file  . Alcohol Use: No      Review of Systems  Constitutional: Positive for diaphoresis. Negative for fever and appetite change.  Respiratory: Negative for shortness of breath.   Gastrointestinal: Positive for nausea, abdominal pain and constipation. Negative for vomiting.  Genitourinary: Negative for dysuria and decreased urine volume.  Neurological: Positive for dizziness and headaches. Negative for weakness.    Allergies  Sulfa antibiotics  Home Medications  Current Outpatient Rx  Name Route Sig Dispense Refill  . ATORVASTATIN CALCIUM 10 MG PO TABS Oral Take 10 mg by mouth daily.    Marland Kitchen ENOXAPARIN SODIUM 30 MG/0.3ML [place] SOLN Subcutaneous Inject 0.3 mLs (30 mg total) into the skin every 12 (twelve) hours. 0.3 mL 0    STOP WHEN COUMADIN IS THERAPEUTIC WITH INR 2-3.  Marland Kitchen LEVOTHYROXINE SODIUM 175 MCG PO TABS Oral Take 175 mcg by mouth daily.    Marland Kitchen METHOCARBAMOL 500 MG PO TABS Oral Take 500 mg by mouth every 6 (six) hours as needed. For spasms    . OXYCODONE-ACETAMINOPHEN 10-325 MG PO TABS Oral Take 1 tablet by mouth every 4 (four) hours as  needed. For pain    . WARFARIN SODIUM 5 MG PO TABS Oral Take 7.5-10 mg by mouth daily. Takes 1.5 tablets on Thursday Takes 2 tablets on Friday Alternates every other day as to what the amount is.    Marland Kitchen ZOLPIDEM TARTRATE 10 MG PO TABS Oral Take 10 mg by mouth at bedtime as needed. For sleep      BP 174/111  Pulse 91  Temp 98.2 F (36.8 C) (Oral)  Resp 16  SpO2 93%  Physical Exam  Nursing note and vitals reviewed. Constitutional: He appears well-developed and well-nourished. No distress.  HENT:  Head: Normocephalic and atraumatic.  Neck: Neck supple.  Cardiovascular: Normal rate and regular rhythm.   Pulmonary/Chest: Effort normal and breath sounds normal. No respiratory distress. He has no wheezes. He has no rales.  Abdominal: Soft. Bowel sounds are normal. He exhibits no distension and no mass. There is no tenderness. There is no rebound and no guarding.  Genitourinary: Rectal exam shows internal hemorrhoid and tenderness.       No stool impaction  Musculoskeletal:       Recent surgery on left knee, vertical incision intact, no discharge.  Leg is diffusely edematous including knee and lower leg.  Very mild erythema and small ecchymoses over shin.  Pt able to bend knee nearly to 90 degrees.  Distal pulses intact.    Neurological: He is alert. He exhibits normal muscle tone.  Skin: He is not diaphoretic.    ED Course  Procedures (including critical care time)  Labs Reviewed  CBC WITH DIFFERENTIAL - Abnormal; Notable for the following:    WBC 11.1 (*)     RBC 4.20 (*)     Hemoglobin 12.9 (*)     HCT 37.4 (*)     Platelets 482 (*)     Monocytes Absolute 1.1 (*)     All other components within normal limits  COMPREHENSIVE METABOLIC PANEL - Abnormal; Notable for the following:    Sodium 132 (*)     Chloride 94 (*)     Glucose, Bld 113 (*)     Calcium 10.6 (*)     Albumin 3.2 (*)     All other components within normal limits  URINALYSIS, ROUTINE W REFLEX MICROSCOPIC -  Abnormal; Notable for the following:    Hgb urine dipstick MODERATE (*)     All other components within normal limits  PROTIME-INR - Abnormal; Notable for the following:    Prothrombin Time 25.4 (*)     INR 2.27 (*)     All other components within normal limits  OCCULT BLOOD, POC DEVICE  URINE MICROSCOPIC-ADD ON   Ct Abdomen Pelvis W Contrast  04/16/2012  *RADIOLOGY REPORT*  Clinical Data: Question small bowel obstruction, constipation, bloating  CT ABDOMEN  AND PELVIS WITH CONTRAST  Technique:  Multidetector CT imaging of the abdomen and pelvis was performed following the standard protocol during bolus administration of intravenous contrast.  Contrast: 80mL OMNIPAQUE IOHEXOL 300 MG/ML  SOLN  Comparison: CT 03/04/2011  Findings: Lung bases are clear.  No pericardial fluid.  No focal hepatic lesion.  Gallbladder, pancreas, spleen, adrenal glands, and kidneys are normal.  The stomach, small bowel, and colon are normal.  There is a moderate volume of stool throughout the colon without obstruction lesion.  Abdominal aorta normal caliber.  No retroperitoneal or periportal lymphadenopathy.  There is simple cyst in the left kidney.  No free fluid the pelvis.  The bladder and prostate gland are normal. Borderline enlarged left external iliac lymph node measuring 10 mm which is slightly increased from 9 mm on prior.  11 mm lymph node (image 80) increased from 7 mm on prior.  No aggressive osseous lesions.  IMPRESSION:  1.  No evidence of bowel obstruction. 2.  No acute findings in the abdomen or  pelvis. 3.  Small left external iliac lymph nodes are increased slightly in volume compared to prior and are now borderline pathologic in size criteria.   Original Report Authenticated By: Genevive Bi, M.D.    Dg Abd Acute W/chest  04/16/2012  *RADIOLOGY REPORT*  Clinical Data: Status post knee surgery 04/07/2012.  Constipation.  ACUTE ABDOMEN SERIES (ABDOMEN 2 VIEW & CHEST 1 VIEW)  Comparison: Single view of the  abdomen 04/08/2012 and CT abdomen and pelvis 03/04/2011.  Plain film chest 03/31/2012.  Findings: Single view of the chest demonstrates clear lungs and normal heart size.  There is peribronchial thickening.  No pneumothorax or pleural fluid.  Two views of the abdomen show no free intraperitoneal air.  There is no evidence of bowel obstruction.  Moderately large stool burden is noted.  IMPRESSION: No acute finding.  Moderately large stool burden.   Original Report Authenticated By: Bernadene Bell. Maricela Curet, M.D.     Discussed patient with Dr Silverio Lay.  3:34 PM Discussed results with Dr Silverio Lay.  Plan is for enema and d/c home.   Discussed all results with patient.    4:12 PM Pt is concerned that he has been developing erythema over his anterior shin of his left leg.  Pt does have some light erythema, it is warm but it is nontender.  Pt with diffuse edema involving knee and left leg - expected given knee replacement 8/28.  Though patient does report daily night sweats since the surgery, he has had no fevers and his WBC count is only mildly elevated at 11.1.  While this may be very early cellulitis, I do not believe this is a DVT as patient is nontender, no calf tenderness, and INR is therapeutic.  Will call [facility] as pt just had surgery 8/28.    1. Constipation   2. Rectal bleeding   3. Cellulitis       MDM  Pt with total knee replacement 8/28, has not had normal bowel movement since.  Did have one episode of small amount of melena 5 days ago.  No stool impaction on exam.  Abdomen is nondistended and nontender, though patient states he is not passing much flatus, is nauseated and belching frequently.  Xray shows large stool burden.  CT shows enlarged lymph nodes, no SBO.  Pt also with concern for erythema over left shin.  Pt is afebrile, nontoxic, surgical incision is intact without discharge.  Doubt joint infection.  With this erythema so soon after surgery, starting while in ED for other  reasons, concern for early cellulitis.  I have paged on call doctor for Dr Eulah Pont, spoke with Dr Madelon Lips.  Plan is for d/c home with Keflex, close follow up with Dr Thurston Hole.  Discussed all results with patient.  Pt given return precautions.  Pt verbalizes understanding and agrees with plan.           [place], [place] 04/16/12 [phone]

## 2012-04-17 NOTE — ED Provider Notes (Signed)
Medical screening examination/treatment/procedure(s) were performed by non-physician practitioner and as supervising physician I was immediately available for consultation/collaboration.   Richardean Canal, MD 04/17/12 0700

## 2012-04-27 NOTE — Discharge Summary (Signed)
  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  Failed conservative treatment.    SIGNIFICANT FINDINGS:  DJD  OPERATION:  Left total knee replacement  FINAL DIAGNOSIS:   same  SECONDARY DIAGNOSIS: none  CONSULTANTS:  GI for constipation  DISCHARGE CONDITION:  STABLE  DISCHARGED TO:  HOME

## 2012-09-01 ENCOUNTER — Emergency Department (HOSPITAL_COMMUNITY): Payer: Medicaid Other

## 2012-09-01 ENCOUNTER — Inpatient Hospital Stay (HOSPITAL_COMMUNITY)
Admission: AD | Admit: 2012-09-01 | Discharge: 2012-09-16 | DRG: 885 | Disposition: A | Discharge status: Home / Self Care | Payer: Medicaid Other | Source: Intra-hospital | Attending: Psychiatry | Admitting: Psychiatry

## 2012-09-01 ENCOUNTER — Emergency Department (HOSPITAL_COMMUNITY)
Admission: EM | Admit: 2012-09-01 | Discharge: 2012-09-01 | Disposition: A | Discharge status: Other Acute Inpatient Hospital | Payer: Medicaid Other | Source: Home / Self Care

## 2012-09-01 ENCOUNTER — Encounter (HOSPITAL_COMMUNITY): Payer: Self-pay

## 2012-09-01 ENCOUNTER — Encounter (HOSPITAL_COMMUNITY): Payer: Self-pay | Admitting: Emergency Medicine

## 2012-09-01 DIAGNOSIS — F29 Unspecified psychosis not due to a substance or known physiological condition: Secondary | ICD-10-CM | POA: Insufficient documentation

## 2012-09-01 DIAGNOSIS — Z79899 Other long term (current) drug therapy: Secondary | ICD-10-CM

## 2012-09-01 DIAGNOSIS — R45851 Suicidal ideations: Secondary | ICD-10-CM

## 2012-09-01 DIAGNOSIS — Z8701 Personal history of pneumonia (recurrent): Secondary | ICD-10-CM | POA: Insufficient documentation

## 2012-09-01 DIAGNOSIS — F329 Major depressive disorder, single episode, unspecified: Secondary | ICD-10-CM | POA: Diagnosis present

## 2012-09-01 DIAGNOSIS — F17200 Nicotine dependence, unspecified, uncomplicated: Secondary | ICD-10-CM | POA: Insufficient documentation

## 2012-09-01 DIAGNOSIS — Z8673 Personal history of transient ischemic attack (TIA), and cerebral infarction without residual deficits: Secondary | ICD-10-CM

## 2012-09-01 DIAGNOSIS — E039 Hypothyroidism, unspecified: Secondary | ICD-10-CM | POA: Insufficient documentation

## 2012-09-01 DIAGNOSIS — F411 Generalized anxiety disorder: Secondary | ICD-10-CM | POA: Diagnosis present

## 2012-09-01 DIAGNOSIS — R05 Cough: Secondary | ICD-10-CM | POA: Insufficient documentation

## 2012-09-01 DIAGNOSIS — R0602 Shortness of breath: Secondary | ICD-10-CM | POA: Insufficient documentation

## 2012-09-01 DIAGNOSIS — Z8739 Personal history of other diseases of the musculoskeletal system and connective tissue: Secondary | ICD-10-CM | POA: Insufficient documentation

## 2012-09-01 DIAGNOSIS — F339 Major depressive disorder, recurrent, unspecified: Secondary | ICD-10-CM

## 2012-09-01 DIAGNOSIS — R079 Chest pain, unspecified: Secondary | ICD-10-CM | POA: Insufficient documentation

## 2012-09-01 DIAGNOSIS — Z8669 Personal history of other diseases of the nervous system and sense organs: Secondary | ICD-10-CM | POA: Insufficient documentation

## 2012-09-01 DIAGNOSIS — R062 Wheezing: Secondary | ICD-10-CM | POA: Insufficient documentation

## 2012-09-01 DIAGNOSIS — IMO0002 Reserved for concepts with insufficient information to code with codable children: Secondary | ICD-10-CM | POA: Insufficient documentation

## 2012-09-01 DIAGNOSIS — F332 Major depressive disorder, recurrent severe without psychotic features: Principal | ICD-10-CM | POA: Diagnosis present

## 2012-09-01 DIAGNOSIS — R091 Pleurisy: Secondary | ICD-10-CM | POA: Insufficient documentation

## 2012-09-01 DIAGNOSIS — F32A Depression, unspecified: Secondary | ICD-10-CM | POA: Diagnosis present

## 2012-09-01 DIAGNOSIS — I10 Essential (primary) hypertension: Secondary | ICD-10-CM | POA: Diagnosis present

## 2012-09-01 DIAGNOSIS — F3189 Other bipolar disorder: Secondary | ICD-10-CM | POA: Insufficient documentation

## 2012-09-01 DIAGNOSIS — R059 Cough, unspecified: Secondary | ICD-10-CM | POA: Insufficient documentation

## 2012-09-01 DIAGNOSIS — Z8709 Personal history of other diseases of the respiratory system: Secondary | ICD-10-CM | POA: Insufficient documentation

## 2012-09-01 DIAGNOSIS — F419 Anxiety disorder, unspecified: Secondary | ICD-10-CM | POA: Diagnosis present

## 2012-09-01 HISTORY — DX: Essential (primary) hypertension: I10

## 2012-09-01 HISTORY — DX: Bipolar disorder, unspecified: F31.9

## 2012-09-01 LAB — POCT I-STAT TROPONIN I
Troponin i, poc: 0 ng/mL (ref 0.00–0.08)
Troponin i, poc: 0.01 ng/mL (ref 0.00–0.08)

## 2012-09-01 LAB — CBC
HCT: 46.4 % (ref 39.0–52.0)
MCHC: 34.9 g/dL (ref 30.0–36.0)
MCV: 85.5 fL (ref 78.0–100.0)
Platelets: 262 10*3/uL (ref 150–400)
RDW: 15.3 % (ref 11.5–15.5)
WBC: 12.5 10*3/uL — ABNORMAL HIGH (ref 4.0–10.5)

## 2012-09-01 LAB — D-DIMER, QUANTITATIVE: D-Dimer, Quant: 0.87 ug/mL-FEU — ABNORMAL HIGH (ref 0.00–0.48)

## 2012-09-01 LAB — RAPID URINE DRUG SCREEN, HOSP PERFORMED
Barbiturates: NOT DETECTED
Benzodiazepines: NOT DETECTED

## 2012-09-01 LAB — POCT I-STAT, CHEM 8
BUN: 16 mg/dL (ref 6–23)
Calcium, Ion: 1.11 mmol/L — ABNORMAL LOW (ref 1.12–1.23)
Chloride: 104 mEq/L (ref 96–112)
HCT: 50 % (ref 39.0–52.0)
Potassium: 4.2 mEq/L (ref 3.5–5.1)
Sodium: 137 mEq/L (ref 135–145)

## 2012-09-01 LAB — ETHANOL: Alcohol, Ethyl (B): <11 mg/dL (ref 0–11)

## 2012-09-01 MED ORDER — GABAPENTIN 400 MG PO CAPS
400.0000 mg | ORAL_CAPSULE | Freq: Four times a day (QID) | ORAL | Status: DC
  Administered 2012-09-01 – 2012-09-16 (×59): 400 mg via ORAL
  Filled 2012-09-01 (×65): qty 1

## 2012-09-01 MED ORDER — TRAZODONE HCL 100 MG PO TABS
200.0000 mg | ORAL_TABLET | Freq: Every day | ORAL | Status: DC
  Administered 2012-09-01 – 2012-09-04 (×4): 200 mg via ORAL
  Filled 2012-09-01 (×7): qty 2

## 2012-09-01 MED ORDER — LEVOTHYROXINE SODIUM 175 MCG PO TABS
175.0000 ug | ORAL_TABLET | Freq: Every day | ORAL | Status: DC
  Administered 2012-09-02 – 2012-09-16 (×15): 175 ug via ORAL
  Filled 2012-09-01 (×16): qty 1

## 2012-09-01 MED ORDER — LORAZEPAM 2 MG/ML IJ SOLN
2.0000 mg | Freq: Once | INTRAMUSCULAR | Status: AC
  Administered 2012-09-01: 2 mg via INTRAVENOUS
  Filled 2012-09-01: qty 1

## 2012-09-01 MED ORDER — ALUM & MAG HYDROXIDE-SIMETH 200-200-20 MG/5ML PO SUSP: 30.0000 mL | ORAL | Status: DC | PRN

## 2012-09-01 MED ORDER — IOHEXOL 350 MG/ML SOLN
100.0000 mL | Freq: Once | INTRAVENOUS | Status: AC | PRN
  Administered 2012-09-01: 100 mL via INTRAVENOUS

## 2012-09-01 MED ORDER — NICOTINE 21 MG/24HR TD PT24
21.0000 mg | MEDICATED_PATCH | Freq: Every day | TRANSDERMAL | Status: DC
  Filled 2012-09-01: qty 1

## 2012-09-01 MED ORDER — ATORVASTATIN CALCIUM 10 MG PO TABS
10.0000 mg | ORAL_TABLET | Freq: Every day | ORAL | Status: DC
  Administered 2012-09-01: 10 mg via ORAL
  Filled 2012-09-01: qty 1

## 2012-09-01 MED ORDER — LORAZEPAM 2 MG/ML IJ SOLN
1.0000 mg | Freq: Once | INTRAMUSCULAR | Status: AC
  Administered 2012-09-01: 1 mg via INTRAVENOUS
  Filled 2012-09-01: qty 1

## 2012-09-01 MED ORDER — ACETAMINOPHEN 325 MG PO TABS: 650.0000 mg | ORAL_TABLET | Freq: Four times a day (QID) | ORAL | Status: DC | PRN

## 2012-09-01 MED ORDER — GABAPENTIN 400 MG PO CAPS
400.0000 mg | ORAL_CAPSULE | Freq: Four times a day (QID) | ORAL | Status: DC
  Administered 2012-09-01 (×3): 400 mg via ORAL
  Filled 2012-09-01 (×4): qty 1

## 2012-09-01 MED ORDER — ZOLPIDEM TARTRATE 5 MG PO TABS: 5.0000 mg | ORAL_TABLET | Freq: Every evening | ORAL | Status: DC | PRN

## 2012-09-01 MED ORDER — IBUPROFEN 200 MG PO TABS: 600.0000 mg | ORAL_TABLET | Freq: Three times a day (TID) | ORAL | Status: DC | PRN

## 2012-09-01 MED ORDER — LISINOPRIL 20 MG PO TABS
20.0000 mg | ORAL_TABLET | Freq: Two times a day (BID) | ORAL | Status: DC
  Administered 2012-09-01: 20 mg via ORAL
  Filled 2012-09-01: qty 1

## 2012-09-01 MED ORDER — LISINOPRIL 20 MG PO TABS
20.0000 mg | ORAL_TABLET | Freq: Two times a day (BID) | ORAL | Status: DC
  Administered 2012-09-01 – 2012-09-16 (×27): 20 mg via ORAL
  Filled 2012-09-01 (×37): qty 1

## 2012-09-01 MED ORDER — ALBUTEROL SULFATE HFA 108 (90 BASE) MCG/ACT IN AERS: 2.0000 | INHALATION_SPRAY | Freq: Four times a day (QID) | RESPIRATORY_TRACT | Status: DC | PRN

## 2012-09-01 MED ORDER — TRAZODONE HCL 50 MG PO TABS: 200.0000 mg | ORAL_TABLET | Freq: Every day | ORAL | Status: DC

## 2012-09-01 MED ORDER — ONDANSETRON HCL 8 MG PO TABS: 4.0000 mg | ORAL_TABLET | Freq: Three times a day (TID) | ORAL | Status: DC | PRN

## 2012-09-01 MED ORDER — MAGNESIUM HYDROXIDE 400 MG/5ML PO SUSP: 30.0000 mL | Freq: Every day | ORAL | Status: DC | PRN

## 2012-09-01 MED ORDER — ACETAMINOPHEN 325 MG PO TABS: 650.0000 mg | ORAL_TABLET | ORAL | Status: DC | PRN

## 2012-09-01 MED ORDER — LORAZEPAM 1 MG PO TABS
1.0000 mg | ORAL_TABLET | Freq: Three times a day (TID) | ORAL | Status: DC | PRN
  Administered 2012-09-01 (×2): 1 mg via ORAL
  Filled 2012-09-01 (×2): qty 1

## 2012-09-01 MED ORDER — NICOTINE 21 MG/24HR TD PT24
21.0000 mg | MEDICATED_PATCH | Freq: Every day | TRANSDERMAL | Status: DC
  Administered 2012-09-02 – 2012-09-16 (×15): 21 mg via TRANSDERMAL
  Filled 2012-09-01 (×18): qty 1

## 2012-09-01 MED ORDER — LEVOTHYROXINE SODIUM 175 MCG PO TABS
175.0000 ug | ORAL_TABLET | Freq: Every day | ORAL | Status: DC
  Administered 2012-09-01: 175 ug via ORAL
  Filled 2012-09-01 (×2): qty 1

## 2012-09-01 MED ORDER — ATORVASTATIN CALCIUM 10 MG PO TABS
10.0000 mg | ORAL_TABLET | Freq: Every day | ORAL | Status: DC
  Administered 2012-09-02 – 2012-09-15 (×14): 10 mg via ORAL
  Filled 2012-09-01 (×16): qty 1

## 2012-09-01 NOTE — ED Notes (Signed)
Breakfast ordered for patient  

## 2012-09-01 NOTE — ED Notes (Signed)
Pt agreed to Giving wife information, Wife has last 4 of CSN # as code.

## 2012-09-01 NOTE — Progress Notes (Signed)
Patient ID: Rodney Leach, male   DOB: [birth year], 52 y.o.   MRN: [id number]  Patient is a Voluntary admission for Anxiety and passive thoughts of suicide with a plan to cut his wrists. Pt denies hx of attempts. Pt does verbally contract for safety at this time. Pt states he has been off of his bipolar medications for about eight months and his anxiety has been steadily increasing for three months. Pt states that he recently lost church two members who were friends in the past two weeks which really upset him. Pt lives with his wife and daughter who he says are both a great support for him. Patient very pleasant and cooperative during admission but does endorse depression with a dull, flat affect. Q 15 minute safety checks initiated per protocol; no distress noted at this time.
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
## 2012-09-01 NOTE — ED Notes (Signed)
Pt belongings secured under Nursing desk, an open lockers are unavailable.

## 2012-09-01 NOTE — Tx Team (Signed)
Initial Interdisciplinary Treatment Plan  PATIENT STRENGTHS: (choose at least two) Ability for insight Average or above average intelligence Capable of independent living Communication skills General fund of knowledge Motivation for treatment/growth Physical Health Religious Affiliation Supportive family/friends  PATIENT STRESSORS: Loss of Two friends at church* Medication change or noncompliance   PROBLEM LIST: Problem List/Patient Goals Date to be addressed Date deferred Reason deferred Estimated date of resolution  Depression 09/01/12     Suicide Risk 09/01/12                                                DISCHARGE CRITERIA:  Improved stabilization in mood, thinking, and/or behavior Motivation to continue treatment in a less acute level of care Need for constant or close observation no longer present Verbal commitment to aftercare and medication compliance  PRELIMINARY DISCHARGE PLAN: Outpatient therapy Return to previous living arrangement  PATIENT/FAMIILY INVOLVEMENT: This treatment plan has been presented to and reviewed with the patient, Rodney Leach, and/or family member, .  The patient and family have been given the opportunity to ask questions and make suggestions.  Renaee Munda 09/01/2012, 8:05 PM

## 2012-09-01 NOTE — ED Notes (Signed)
Per EMS pt recently dx with bronchitis and pneumonia last week and on antibiotics. Pt has a cpap machine at home that is broken. Pt has a burning sensation in his chest and is very anxious.

## 2012-09-01 NOTE — ED Provider Notes (Addendum)
History     CSN: [id number]  Arrival date & time 09/01/12  0018   First MD Initiated Contact with Patient 09/01/12 0019      Chief Complaint  Patient presents with  . Shortness of Breath  . Anxiety  . Pleurisy    (Consider location/radiation/quality/duration/timing/severity/associated sxs/prior treatment) Patient is a 52 y.o. male presenting with shortness of breath and anxiety. The history is provided by the patient.  Shortness of Breath  The current episode started more than 2 weeks ago. The problem occurs rarely. The problem has been gradually worsening. The problem is moderate. Nothing relieves the symptoms. Nothing aggravates the symptoms. Associated symptoms include chest pain, shortness of breath and wheezing. The cough has no precipitants.  Anxiety This is a new problem. The current episode started more than 1 week ago. The problem has not changed since onset.Associated symptoms include chest pain and shortness of breath. Nothing aggravates the symptoms.    Past Medical History  Diagnosis Date  . Stroke     "light" stroke  . Hypothyroidism   . Sleep apnea     CPAP  . Headache   . Arthritis   . Complication of anesthesia     has woken up during surgery before     Past Surgical History  Procedure Date  . Hand surgery     R & L   . Total knee arthroplasty 2012    Right  . Back surgery     fusion L5/S1  . Hernia repair     umbilical  . Nose surgery     fracture repair  . Multiple tooth extractions   . Total knee arthroplasty 04/07/2012    Procedure: TOTAL KNEE ARTHROPLASTY;  Surgeon: Loreta Ave, MD;  Location: [facility] OR;  Service: Orthopedics;  Laterality: Left;  left total knee arthroplasty    No family history on file.  History  Substance Use Topics  . Smoking status: Current Every Day Smoker -- 0.5 packs/day  . Smokeless tobacco: Not on file  . Alcohol Use: No      Review of Systems  Respiratory: Positive for shortness of breath and wheezing.     Cardiovascular: Positive for chest pain.  Psychiatric/Behavioral: Positive for suicidal ideas.  All other systems reviewed and are negative.    Allergies  Sulfa antibiotics  Home Medications   Current Outpatient Rx  Name  Route  Sig  Dispense  Refill  . ATORVASTATIN CALCIUM 10 MG PO TABS   Oral   Take 10 mg by mouth daily.         Marland Kitchen ENOXAPARIN SODIUM 30 MG/0.3ML [place] SOLN   Subcutaneous   Inject 0.3 mLs (30 mg total) into the skin every 12 (twelve) hours.   0.3 mL   0     STOP WHEN COUMADIN IS THERAPEUTIC WITH INR 2-3.   Marland Kitchen LEVOTHYROXINE SODIUM 175 MCG PO TABS   Oral   Take 175 mcg by mouth daily.         Marland Kitchen METHOCARBAMOL 500 MG PO TABS   Oral   Take 500 mg by mouth every 6 (six) hours as needed. For spasms         . OXYCODONE-ACETAMINOPHEN 10-325 MG PO TABS   Oral   Take 1 tablet by mouth every 4 (four) hours as needed. For pain         . WARFARIN SODIUM 5 MG PO TABS   Oral   Take 7.5-10 mg by mouth daily. Takes 1.5  tablets on Thursday Takes 2 tablets on Friday Alternates every other day as to what the amount is.         Marland Kitchen ZOLPIDEM TARTRATE 10 MG PO TABS   Oral   Take 10 mg by mouth at bedtime as needed. For sleep           SpO2 97%  Physical Exam  Constitutional: He is oriented to person, place, and time. He appears well-developed and well-nourished.  HENT:  Head: Normocephalic and atraumatic.  Eyes: Conjunctivae normal are normal. Pupils are equal, round, and reactive to light.  Neck: Normal range of motion. Neck supple.  Cardiovascular: Normal rate, regular rhythm, normal heart sounds and intact distal pulses.   Pulmonary/Chest: Effort normal and breath sounds normal.  Abdominal: Soft. Bowel sounds are normal.  Neurological: He is alert and oriented to person, place, and time.  Skin: Skin is warm and dry.  Psychiatric: He has a normal mood and affect. His behavior is normal. Judgment normal. He expresses suicidal ideation.    ED Course   Procedures (including critical care time)   Labs Reviewed  CBC  URINE RAPID DRUG SCREEN (HOSP PERFORMED)  ETHANOL   No results found.   No diagnosis found.   Date: 09/01/2012  Rate: 100  Rhythm: sinus tachycardia  QRS Axis: left  Intervals: normal  ST/T Wave abnormalities: nonspecific ST changes  Conduction Disutrbances:none  Narrative Interpretation:   Old EKG Reviewed: none available   MDM  + bronchitis,  Suicidal ideation, will reassess  Improved.  Pt medically clear for bhh referral     Deiondra Denley Lytle Michaels, MD 09/01/12 0033  Sheniece Ruggles Lytle Michaels, MD 09/01/12 0230  Rosanne Ashing, MD 09/01/12 [phone]

## 2012-09-01 NOTE — Progress Notes (Signed)
Patient ID: Rodney Leach, male   DOB: 1961/03/28, 52 y.o.   MRN: [id number] Report given to Morrie Sheldon, [place]

## 2012-09-01 NOTE — ED Notes (Signed)
Pts belonging to locker 9

## 2012-09-01 NOTE — ED Notes (Signed)
Pt moved to room 3, door open, patient being watched from nurses station.

## 2012-09-01 NOTE — ED Notes (Signed)
Pt not diaphoretic but states that he is still anxious. Pt a bit restless in bed. Provider made aware

## 2012-09-01 NOTE — ED Notes (Signed)
X-ray at bedside

## 2012-09-01 NOTE — ED Notes (Signed)
ACT at bedside 

## 2012-09-01 NOTE — Progress Notes (Signed)
Adult Psychoeducational Group Note  Date:  09/01/2012 Time:  8:00PM  Group Topic/Focus:  Wrap-Up Group:   The focus of this group is to help patients review their daily goal of treatment and discuss progress on daily workbooks.  Participation Level:  Active  Participation Quality:  Appropriate  Affect:  Appropriate  Cognitive:  Alert and Oriented  Insight: Appropriate  Engagement in Group:  Developing/Improving  Modes of Intervention:  Clarification, Discussion, Exploration, Rapport Building and Support  Additional Comments:  Pt stated that he is here to get his medications situated. Pt stated that his goal tomorrow is to be able to talk to his youngest daughter. Pt stated that his supports are his wife and his children.   Beadie Matsunaga, Randal Buba 09/01/2012, 9:59 PM

## 2012-09-01 NOTE — BH Assessment (Signed)
BHH Assessment Progress Note      Pt has been accepted to [facility], to bed 504.02 by Shelda Jakes to Dr. Daleen Bo.  All support paperwork completed and faxed to [facility].  Dr. Clarene Duke and nursing staff notified and agreeable with transfer.  Pt remains able to contract for safety.

## 2012-09-01 NOTE — Progress Notes (Signed)
D: Patient in dayroom on approach.  Patient appears sad and depressed.  Patient states he has been depressed.  Patient appears tearful when speaking with Clinical research associate. Patient visible on the unit.  Patient states she is having passive SI but verbally contracts for safety.  A: Staff to monitor Q 15 mins for safety.  Encouragement and support offered.  Scheduled medications administered per orders R: Patient remains safe on the unit.  Patient attended group tonight.  Patient taking administered mediations.

## 2012-09-02 DIAGNOSIS — F3160 Bipolar disorder, current episode mixed, unspecified: Secondary | ICD-10-CM

## 2012-09-02 LAB — URINALYSIS, ROUTINE W REFLEX MICROSCOPIC
Leukocytes, UA: NEGATIVE
Nitrite: NEGATIVE
Specific Gravity, Urine: 1.02 (ref 1.005–1.030)
pH: 6.5 (ref 5.0–8.0)

## 2012-09-02 LAB — CBC
HCT: 47.6 % (ref 39.0–52.0)
MCHC: 33.2 g/dL (ref 30.0–36.0)
Platelets: 270 10*3/uL (ref 150–400)
RDW: 15.4 % (ref 11.5–15.5)

## 2012-09-02 LAB — HEPATIC FUNCTION PANEL
Albumin: 3.2 g/dL — ABNORMAL LOW (ref 3.5–5.2)
Alkaline Phosphatase: 102 U/L (ref 39–117)
Total Protein: 6.8 g/dL (ref 6.0–8.3)

## 2012-09-02 LAB — TSH: TSH: 3.348 u[IU]/mL (ref 0.350–4.500)

## 2012-09-02 LAB — URINE MICROSCOPIC-ADD ON

## 2012-09-02 LAB — LIPID PANEL
Total CHOL/HDL Ratio: 3 RATIO
VLDL: 20 mg/dL (ref 0–40)

## 2012-09-02 LAB — HEMOGLOBIN A1C: Mean Plasma Glucose: 146 mg/dL — ABNORMAL HIGH (ref <117)

## 2012-09-02 MED ORDER — FLUOXETINE HCL 10 MG PO CAPS
10.0000 mg | ORAL_CAPSULE | Freq: Every day | ORAL | Status: DC
  Administered 2012-09-02 – 2012-09-03 (×2): 10 mg via ORAL
  Filled 2012-09-02 (×3): qty 1

## 2012-09-02 NOTE — Progress Notes (Signed)
Adult Psychoeducational Group Note  Date:  09/02/2012 Time:  11:39 AM  Group Topic/Focus:  Overcoming Stress:   The focus of this group is to define stress and help patients assess their triggers.  Participation Level:  Active  Participation Quality:  Appropriate, Attentive, Drowsy and Sharing  Affect:  Appropriate  Cognitive:  Appropriate and Oriented  Insight: Appropriate  Engagement in Group:  Developing/Improving  Modes of Intervention:  Activity, Discussion, Education and Support  Additional Comments:  Patient was engaged during group, sharing as appropriate. Patient states "I just feel like I need to write down all my thoughts right now but I just can't."   Noah Charon 09/02/2012, 11:39 AM

## 2012-09-02 NOTE — BHH Suicide Risk Assessment (Signed)
Suicide Risk Assessment  Admission Assessment     Nursing information obtained from:  Patient Demographic factors:  Male;Caucasian Current Mental Status:  Suicidal ideation indicated by patient Loss Factors:  Loss of significant relationship Historical Factors:    Risk Reduction Factors:  Sense of responsibility to family;Religious beliefs about death;Living with another person, especially a relative;Positive therapeutic relationship  CLINICAL FACTORS:   Depressed mood, death of friends from church.  COGNITIVE FEATURES THAT CONTRIBUTE TO RISK:  Thought constriction (tunnel vision)    SUICIDE RISK:   Moderate:  Frequent suicidal ideation with limited intensity, and duration, some specificity in terms of plans, no associated intent, good self-control, limited dysphoria/symptomatology, some risk factors present, and identifiable protective factors, including available and accessible social support.  PLAN OF CARE: Initiate medications for depression. Plan for discharge when stable.  I certify that inpatient services furnished can reasonably be expected to improve the patient's condition.  Rodney Leach 09/02/2012, 10:08 AM
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
## 2012-09-02 NOTE — Progress Notes (Signed)
  D) Patient pleasant and cooperative upon my assessment. Patient states "I just want to get better and find out why I always start to feel like this." Patient completed Patient Self Inventory, reports slept "poor," and  appetite is "good." Patient verbalizes "I need my CPAP machine from home but it is broken right now." Assured patient we would attempt to secure a loaner CPAP machine for his use during this hospital admission. Patient verbalizes understanding.  Patient rates depression as  9 /10, patient rates hopeless feelings as  10/10. Patient endorses SI, contracts verbally for safety with RN. Patient denies HI, denies A/V hallucinations.   A) Patient offered support and encouragement, patient encouraged to discuss feelings/concerns with staff. Patient verbalized understanding. Patient monitored Q15 minutes for safety. Patient met with MD  to discuss today's goals and plan of care.  R) Patient visible in milieu, attending groups in day room and meals in dining room. Patient appropriate with staff and peers. Patient taking medications as ordered. Will continue to monitor.

## 2012-09-02 NOTE — Progress Notes (Signed)
Adult Psychoeducational Group Note  Date:  09/02/2012 Time:  11:18 AM  Group Topic/Focus:  Therapeutic activity  Participation Level:  Minimal  Participation Quality:  Appropriate  Affect:  Blunted  Cognitive:  Appropriate  Insight: Appropriate  Engagement in Group:  Engaged  Modes of Intervention:  Support  Additional Comments:  Pt participated and processed in group.  Marquis Lunch, Sharell Hilmer 09/02/2012, 11:18 AM

## 2012-09-02 NOTE — Clinical Social Work Note (Signed)
[facility] LCSW Group Therapy  Living a Balanced Life 1:15 - 3: 30          09/02/2012  5:03 PM    Type of Therapy:  Group Therapy  Participation Level:  Appropriate  Participation Quality:   Did not Attend Group.  Rodney Leach 09/02/2012  5:03 PM

## 2012-09-02 NOTE — Clinical Social Work Note (Signed)
[facility] LCSW Aftercare Discharge Planning Group Note  09/02/2012 8:45 AM   Participation Quality: Appropriate and Attentive   Affect: Appropriate, Flat and Depressed   Cognitive: Alert and Appropriate   Insight: Developing/Improving   Engagement in Group: Developing/Improving   Modes of Intervention: Clarification, Discussion, Education, Exploration, Problem-solving, Rapport Building, Socialization and Support   Summary of Progress/Problems: Pt attended discharge planning group and actively participated in group. CSW provided pt with today's workbook.  Pt presents with flat affect and depressed mood.  Pt rates depression and anxiety at a 10 today.  Pt endorses SI and contracts for safety on the unit.  Pt denies HI.  Pt states that he is very depressed, anxious, hopeless and helpless.  Pt states that he doesn't know what will help him feel better.  Pt states that he lives in [place] and will return home.  Pt states that he doesn't have a psychiatrist or therapy.  No further needs voiced by pt at this time.    Rodney Leach, LCSWA  09/02/2012 9:20 AM

## 2012-09-03 DIAGNOSIS — F341 Dysthymic disorder: Secondary | ICD-10-CM

## 2012-09-03 DIAGNOSIS — F329 Major depressive disorder, single episode, unspecified: Secondary | ICD-10-CM | POA: Diagnosis present

## 2012-09-03 DIAGNOSIS — F419 Anxiety disorder, unspecified: Secondary | ICD-10-CM

## 2012-09-03 DIAGNOSIS — F32A Depression, unspecified: Secondary | ICD-10-CM | POA: Diagnosis present

## 2012-09-03 HISTORY — DX: Anxiety disorder, unspecified: F41.9

## 2012-09-03 HISTORY — DX: Depression, unspecified: F32.A

## 2012-09-03 MED ORDER — FLUOXETINE HCL 20 MG PO CAPS
20.0000 mg | ORAL_CAPSULE | Freq: Every day | ORAL | Status: DC
  Administered 2012-09-04 – 2012-09-10 (×7): 20 mg via ORAL
  Filled 2012-09-03 (×9): qty 1

## 2012-09-03 MED ORDER — HYDROCHLOROTHIAZIDE 25 MG PO TABS
25.0000 mg | ORAL_TABLET | Freq: Every day | ORAL | Status: DC
  Administered 2012-09-03 – 2012-09-16 (×14): 25 mg via ORAL
  Filled 2012-09-03 (×15): qty 1

## 2012-09-03 MED ORDER — HYDROXYZINE HCL 25 MG PO TABS
25.0000 mg | ORAL_TABLET | ORAL | Status: DC | PRN
  Administered 2012-09-03: 25 mg via ORAL

## 2012-09-03 MED ORDER — ASPIRIN 81 MG PO CHEW
81.0000 mg | CHEWABLE_TABLET | Freq: Every day | ORAL | Status: DC
  Administered 2012-09-03 – 2012-09-16 (×14): 81 mg via ORAL
  Filled 2012-09-03 (×15): qty 1

## 2012-09-03 MED ORDER — HYDROXYZINE HCL 25 MG PO TABS
25.0000 mg | ORAL_TABLET | ORAL | Status: DC
  Administered 2012-09-03 – 2012-09-16 (×60): 25 mg via ORAL
  Filled 2012-09-03 (×83): qty 1

## 2012-09-03 NOTE — Progress Notes (Signed)
[facility] LCSW Aftercare Discharge Planning Group Note        Feelings Around Relapse        1:15-2:30 PM    09/03/2012 3:28 PM  Participation Quality:  Appropriate  Affect:  Appropriate, Blunted, Depressed and Flat  Cognitive:  Alert and Appropriate  Insight:  Developing/Improving  Engagement in Group:  Developing/Improving  Modes of Intervention:  Discussion, Exploration, Problem-solving and Support  Summary of Progress/Problems:  Patient shared relapse for him means not listening when wife pointed out he was spiraling down.  Patient shared he has to live a very structured life in order to stay healthy mentally and physically.  [name] 09/03/2012, 3:28 PM

## 2012-09-03 NOTE — Progress Notes (Signed)
Patient ID: Rodney Leach, male   DOB: 1961-01-04, 52 y.o.   MRN: [id number]  D: Patient pleasant on approach today. Reports that he is still depressed today and has had some passive SI on and off. Reports starting on a new anxiety medicine today and that has helped him some today.  A: Staff will monitor on q 15 minute checks, follow treatment plans, and give meds as ordered. R: Cooperative on unit and attending groups

## 2012-09-03 NOTE — Progress Notes (Signed)
Adult Psychoeducational Group Note  Date:  09/03/2012 Time:  2:57 PM  Group Topic/Focus:  Early Warning Signs:   The focus of this group is to help patients identify signs or symptoms they exhibit before slipping into an unhealthy state or crisis.  Participation Level:  None  Participation Quality:  Attentive  Affect:  Flat  Cognitive:  Appropriate  Insight: None  Engagement in Group:  None  Modes of Intervention:  Discussion and Rapport Building  Additional Comments: Pt observed group and did not share feelings and thoughts with group. Pt reported he wanted to listen rather than talk in group.  Maeola Sarah 09/03/2012, 2:57 PM

## 2012-09-03 NOTE — Progress Notes (Signed)
[facility] LCSW Aftercare Discharge Planning Group Note  09/03/2012 12:40 PM  Participation Quality:  Appropriate and Attentive  Affect:  Appropriate, Blunted, Depressed and Flat  Cognitive:  Alert and Appropriate  Insight:  Developing/Improving  Engagement in Group:  Defensive  Modes of Intervention:  Discussion, Exploration, Problem-solving, Rapport Building and Support  Summary of Progress/Problems:  Patient continues to endorse SI but contracts for safety.  He is rating all symptoms at ten. He also endorses racing thoughts.  Patient shared he really wants to get better.  Patient advised he is seen outpatient by [facility] in [place].  Workbook for groups provided.  [name] 09/03/2012, 12:40 PM

## 2012-09-03 NOTE — Progress Notes (Signed)
  D) Patient pleasant and cooperative upon my assessment. Patient verbalizes "I feel okay but still about the same." Patient completed Patient Self Inventory, reports slept "better than last night," and  appetite is "good." Patient rates depression as  ? /10, patient rates hopeless feelings as 10 /10. Patient encouraged to speak with MD re anxiety medication. Patient endorses passive SI, contracts verbally for safety with RN. Patient denies HI, denies A/V hallucinations.   A) Patient offered support and encouragement, patient encouraged to discuss feelings/concerns with staff. Patient verbalized understanding. Patient monitored Q15 minutes for safety. Patient met with MD  to discuss today's goals and plan of care.  R) Patient visible in milieu, attending groups in day room and meals in dining room. Patient appropriate with staff and peers.   Patient taking medications as ordered. Patient has a plan for after discharge to "not take care of everyone else." Will continue to monitor.

## 2012-09-03 NOTE — Tx Team (Signed)
Interdisciplinary Treatment Plan Update (Adult)  Date:  09/03/2012  Time Reviewed:  12:54 PM   Progress in Treatment: Attending groups:   Yes   Participating in groups:  Yes Taking medication as prescribed:  Yes Tolerating medication:  Yes Family/Significant othe contact made: Contact to be made with family Patient understands diagnosis:  Yes Discussing patient identified problems/goals with staff: Yes Medical problems stabilized or resolved: Yes Denies suicidal/homicidal ideation:No but contracts for safety Issues/concerns per patient self-inventory:  Other:   New problem(s) identified:  Reason for Continuation of Hospitalization: Anxiety Depression Medication stabilization Suicidal ideation  Interventions implemented related to continuation of hospitalization:  Medication Management; safety checks q 15 mins  Additional comments:  Estimated length of stay:  2-3 days  Discharge Plan:  Home with outpatient follow up  New goal(s):  Review of initial/current patient goals per problem list:    1.  Goal(s): Eliminate SI/other thoughts of self harm   Met:  No  Target date: d/c  As evidenced by: Patient will no longer endorse SI/HI or other thoughts of self harm.    2.  Goal (s):Reduce depression/anxiety (rating symptoms at ten today)  Met:  No  Target date: d/c  As evidenced by: Patient will rate symptoms at four or below    3.  Goal(s):.stabilize on meds   Met:  No  Target date: d/c  As evidenced by: Patient will report being stabilized on medications - less symptomatic    4.  Goal(s): Refer for outpatient follow up   Met:  No  Target date: d/c  As evidenced by: Follow up appointment scheduled    Attendees: Patient:   09/03/2012 12:54 PM  Physican:  Patrick North, MD 09/03/2012 12:54 PM  Nursing:    Harold Barban, RN 09/03/2012 12:54 PM   Nursing:   Berneice Heinrich, RN 09/03/2012 12:54 PM   Clinical Social Worker:  Juline Patch, LCSW 09/03/2012  12:54 PM   Other:    09/03/2012 12:54 PM   Other:        09/03/2012 12:54 PM Other:        09/03/2012 12:54 PM
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
## 2012-09-03 NOTE — Progress Notes (Signed)
[facility] MD Progress Note  09/03/2012 1:03 PM OTHER ATIENZA  MRN:  [id number] Subjective:  10/10 depression and anxiety with suicidal ideations Diagnosis:   Axis I: Anxiety Disorder NOS and Depressive Disorder NOS Axis II: Deferred Axis III:  Past Medical History  Diagnosis Date  . Stroke     "light" stroke  . Hypothyroidism   . Sleep apnea     CPAP  . Headache   . Arthritis   . Complication of anesthesia     has woken up during surgery before   . Hypertension   . Bipolar affective    Axis IV: occupational problems, other psychosocial or environmental problems, problems related to social environment and problems with primary support group Axis V: 41-50 serious symptoms  ADL's:  Intact  Sleep: Fair  Appetite:  Good  Suicidal Ideation:  Plan:  None Intent:  None Means:  None Homicidal Ideation:  Denies  Psychiatric Specialty Exam: Review of Systems  Constitutional: Negative.   HENT: Negative.   Eyes: Negative.   Respiratory: Negative.   Cardiovascular: Negative.   Gastrointestinal: Negative.   Genitourinary: Negative.   Musculoskeletal: Negative.   Skin: Negative.   Neurological: Negative.   Endo/Heme/Allergies: Negative.   Psychiatric/Behavioral: Positive for depression. The patient is nervous/anxious.     Blood pressure 143/89, pulse 82, temperature 98.2 F (36.8 C), temperature source Oral, resp. rate 18, height 6\' 1"  (1.854 m), weight 146.965 kg (324 lb).Body mass index is 42.75 kg/(m^2).  General Appearance: Casual  Eye Contact::  Fair  Speech:  Normal Rate  Volume:  Decreased  Mood:  Anxious and Depressed  Affect:  Congruent  Thought Process:  Disorganized  Orientation:  Full (Time, Place, and Person)  Thought Content:  WDL  Suicidal Thoughts:  Yes.  without intent/plan  Homicidal Thoughts:  No  Memory:  Immediate;   Fair Recent;   Fair Remote;   Fair  Judgement:  Fair  Insight:  Fair  Psychomotor Activity:  Decreased  Concentration:  Fair    Recall:  Fair  Akathisia:  No  Handed:  Right  AIMS (if indicated):     Assets:  Communication Skills Desire for Improvement Housing Social Support  Sleep:  Number of Hours: 6.5    Current Medications: Current Facility-Administered Medications  Medication Dose Route Frequency Provider Last Rate Last Dose  . acetaminophen (TYLENOL) tablet 650 mg  650 mg Oral Q6H PRN Kerry Hough, PA      . albuterol (PROVENTIL HFA;VENTOLIN HFA) 108 (90 BASE) MCG/ACT inhaler 2 puff  2 puff Inhalation Q6H PRN Kerry Hough, PA      . alum & mag hydroxide-simeth (MAALOX/MYLANTA) 200-200-20 MG/5ML suspension 30 mL  30 mL Oral Q4H PRN Kerry Hough, PA      . aspirin chewable tablet 81 mg  81 mg Oral Daily Nanine Means, NP      . atorvastatin (LIPITOR) tablet 10 mg  10 mg Oral Daily Kerry Hough, PA   10 mg at 09/02/12 1706  . FLUoxetine (PROZAC) capsule 20 mg  20 mg Oral Daily Nanine Means, NP      . gabapentin (NEURONTIN) capsule 400 mg  400 mg Oral QID Kerry Hough, PA   400 mg at 09/03/12 1208  . hydrochlorothiazide (HYDRODIURIL) tablet 25 mg  25 mg Oral Daily Nanine Means, NP      . hydrOXYzine (ATARAX/VISTARIL) tablet 25 mg  25 mg Oral Q4H PRN Nanine Means, NP      .  levothyroxine (SYNTHROID, LEVOTHROID) tablet 175 mcg  175 mcg Oral QAC breakfast Kerry Hough, PA   175 mcg at 09/03/12 9604  . lisinopril (PRINIVIL,ZESTRIL) tablet 20 mg  20 mg Oral BID Kerry Hough, PA   20 mg at 09/03/12 5409  . magnesium hydroxide (MILK OF MAGNESIA) suspension 30 mL  30 mL Oral Daily PRN Kerry Hough, PA      . nicotine (NICODERM CQ - dosed in mg/24 hours) patch 21 mg  21 mg Transdermal Q0600 Kerry Hough, PA   21 mg at 09/03/12 8119  . traZODone (DESYREL) tablet 200 mg  200 mg Oral QHS Kerry Hough, PA   200 mg at 09/02/12 2218    Lab Results:  Results for orders placed during the hospital encounter of 09/01/12 (from the past 48 hour(s))  CBC     Status: Abnormal   Collection Time    09/02/12  6:10 AM      Component Value Range Comment   WBC 12.1 (*) 4.0 - 10.5 K/uL    RBC 5.44  4.22 - 5.81 MIL/uL    Hemoglobin 15.8  13.0 - 17.0 g/dL    HCT 14.7  82.9 - 56.2 %    MCV 87.5  78.0 - 100.0 fL    MCH 29.0  26.0 - 34.0 pg    MCHC 33.2  30.0 - 36.0 g/dL    RDW 13.0  86.5 - 78.4 %    Platelets 270  150 - 400 K/uL   HEMOGLOBIN A1C     Status: Abnormal   Collection Time   09/02/12  6:10 AM      Component Value Range Comment   Hemoglobin A1C 6.7 (*) <5.7 %    Mean Plasma Glucose 146 (*) <117 mg/dL   LIPID PANEL     Status: Normal   Collection Time   09/02/12  6:10 AM      Component Value Range Comment   Cholesterol 164  0 - 200 mg/dL    Triglycerides 99  <696 mg/dL    HDL 54  >29 mg/dL    Total CHOL/HDL Ratio 3.0      VLDL 20  0 - 40 mg/dL    LDL Cholesterol 90  0 - 99 mg/dL   HEPATIC FUNCTION PANEL     Status: Abnormal   Collection Time   09/02/12  6:10 AM      Component Value Range Comment   Total Protein 6.8  6.0 - 8.3 g/dL    Albumin 3.2 (*) 3.5 - 5.2 g/dL    AST 18  0 - 37 U/L    ALT 24  0 - 53 U/L    Alkaline Phosphatase 102  39 - 117 U/L    Total Bilirubin 0.6  0.3 - 1.2 mg/dL    Bilirubin, Direct 0.2  0.0 - 0.3 mg/dL    Indirect Bilirubin 0.4  0.3 - 0.9 mg/dL   TSH     Status: Normal   Collection Time   09/02/12  6:10 AM      Component Value Range Comment   TSH 3.348  0.350 - 4.500 uIU/mL   URINALYSIS, ROUTINE W REFLEX MICROSCOPIC     Status: Abnormal   Collection Time   09/02/12  6:24 AM      Component Value Range Comment   Color, Urine YELLOW  YELLOW    APPearance CLOUDY (*) CLEAR    Specific Gravity, Urine 1.020  1.005 - 1.030  pH 6.5  5.0 - 8.0    Glucose, UA NEGATIVE  NEGATIVE mg/dL    Hgb urine dipstick TRACE (*) NEGATIVE    Bilirubin Urine NEGATIVE  NEGATIVE    Ketones, ur NEGATIVE  NEGATIVE mg/dL    Protein, ur NEGATIVE  NEGATIVE mg/dL    Urobilinogen, UA 1.0  0.0 - 1.0 mg/dL    Nitrite NEGATIVE  NEGATIVE    Leukocytes, UA NEGATIVE   NEGATIVE   URINE MICROSCOPIC-ADD ON     Status: Abnormal   Collection Time   09/02/12  6:24 AM      Component Value Range Comment   Squamous Epithelial / LPF RARE  RARE    RBC / HPF 0-2  <3 RBC/hpf    Bacteria, UA FEW (*) RARE     Physical Findings: AIMS: Facial and Oral Movements Muscles of Facial Expression: None, normal Lips and Perioral Area: None, normal Jaw: None, normal Tongue: None, normal,Extremity Movements Upper (arms, wrists, hands, fingers): None, normal Lower (legs, knees, ankles, toes): None, normal, Trunk Movements Neck, shoulders, hips: None, normal, Overall Severity Severity of abnormal movements (highest score from questions above): None, normal Incapacitation due to abnormal movements: None, normal Patient's awareness of abnormal movements (rate only patient's report): No Awareness, Dental Status Current problems with teeth and/or dentures?: No Does patient usually wear dentures?: No  CIWA:  CIWA-Ar Total: 3  COWS:  COWS Total Score: 1   Treatment Plan Summary: Daily contact with patient to assess and evaluate symptoms and progress in treatment Medication management  Plan:  Review of chart, vital signs, medications, and notes. 1-Admit for crisis management and stabilization.  Estimated length of stay 5-7 days past his current stay of 2 2-Individual and group therapy encouraged 3-Medication management for depression and anxiety to reduce current symptoms to base line and improve the patient's overall level of functioning:  Medications reviewed with the patient and he stated no untoward effects, Prozac increased, HCTZ added for his blood pressure, vistaril ordered for his anxiety 4-Coping skills for depression and anxiety developing-- 5-Continue crisis stabilization and management 6-Address health issues--monitoring blood pressures and HCTZ added for stability 7-Treatment plan in progress to prevent relapse of depression and anxiety 8-Psychosocial education  regarding relapse prevention and self-care 8-Health care follow up as needed for blood pressure and pain issues 9-Call for consult with hospitalist for additional specialty patient services as needed.  Medical Decision Making Problem Points:  Review of last therapy session (1) and Review of psycho-social stressors (1) Data Points:  Review of new medications or change in dosage (2)  I certify that inpatient services furnished can reasonably be expected to improve the patient's condition.   Nanine Means, PMH-NP 09/03/2012, 1:03 PM

## 2012-09-03 NOTE — Progress Notes (Signed)
Patient ID: Rodney Leach, male   DOB: 30-Nov-1960, 52 y.o.   MRN: [id number]  D: Patient has been pleasant on approach tonight. Reports he is still feeling depressed and reports it is probably due to his mania from his bipolar. Does still have some passive SI but contracts. Cooperative on the unit. A: Staff will monitor on q 15 minute checks, follow treatment plan, and give meds as ordered. R: Patient obtained meds and undersigned set his CPAP machine up for him tonight. Participated in group.

## 2012-09-04 MED ORDER — MIRTAZAPINE 15 MG PO TBDP
15.0000 mg | ORAL_TABLET | Freq: Every evening | ORAL | Status: DC | PRN
  Administered 2012-09-04 (×2): 15 mg via ORAL
  Filled 2012-09-04 (×8): qty 1

## 2012-09-04 NOTE — Progress Notes (Signed)
Writer spoke with patient and he reports that his day has been ok. Patient c/o not sleeping well at all and hopes that his medications will work for him tonight. Patient informed of medications scheduled at hs and visteril due at 0200 and he reports that if he is asleep, do not wake him.  Patient reports constant si and verbally contracts for safety, he does not know why these thoughts won't go away and is hopeful his medications will help.  Patient offered support and encouragement, safety maintained on unit, will continue to monitor.

## 2012-09-04 NOTE — Clinical Social Work Psychosocial (Signed)
[facility] Group Notes:  (Clinical Social Work)  09/04/2012   3:00-4:00PM  Summary of Progress/Problems:   The main focus of today's process group was for the patient to identify ways in which they have in the past sabotaged their own recovery and to discuss their motivation to change, as well as confidence that they can change. Cognitive Behavioral Therapy concepts about the interconnectedness of thoughts/feelings/actions was introduced.  The patient expressed that he sabotaged himself by stopping his normal sleep cycle, that he felt he was needed to do other things, and he thought "well, this is what the family wants anyway."  His motivation to change this is 2 out of 10, and his confidence 3 out of 10.  He stated that he has been off his medications about 9 months, and that as he gets back on his meds, this will likely change.  Type of Therapy:  Group Therapy - Process  Participation Level:  Active  Participation Quality:  Attentive and Sharing  Affect:  Blunted and Depressed  Cognitive:  Appropriate and Oriented  Insight:  Developing/Improving  Engagement in Therapy:  Engaged  Modes of Intervention:  Clarification, Education, Limit-setting, Problem-solving, Socialization, Support and Processing, Exploration, Discussion   Ambrose Mantle, LCSW 09/04/2012, 5:53 PM

## 2012-09-04 NOTE — Progress Notes (Signed)
Patient ID: Rodney Leach, male   DOB: August 27, 1960, 52 y.o.   MRN: [id number] [facility] MD Progress Note  09/04/2012 5:06 PM Rodney Leach  MRN:  [id number] Subjective:  10/10 depression and anxiety with suicidal ideations and then earlier this afternoon he felt hopeful. Struggles to get top sleep and stay asleep. Also still has racing thoughts although they have decreased some.  Diagnosis:   Axis I: Anxiety Disorder NOS and Depressive Disorder NOS Axis II: Deferred Axis III:  Past Medical History  Diagnosis Date  . Stroke     "light" stroke  . Hypothyroidism   . Sleep apnea     CPAP  . Headache   . Arthritis   . Complication of anesthesia     has woken up during surgery before   . Hypertension   . Bipolar affective    Axis IV: occupational problems, other psychosocial or environmental problems, problems related to social environment and problems with primary support group Axis V: 41-50 serious symptoms  ADL's:  Intact  Sleep: Fair  Appetite:  Good  Suicidal Ideation:  Plan:  None Intent:  None Means:  None Homicidal Ideation:  Denies  Psychiatric Specialty Exam: Review of Systems  Constitutional: Negative.   HENT: Negative.   Eyes: Negative.   Respiratory: Negative.   Cardiovascular: Negative.   Gastrointestinal: Negative.   Genitourinary: Negative.   Musculoskeletal: Negative.   Skin: Negative.   Neurological: Negative.   Endo/Heme/Allergies: Negative.   Psychiatric/Behavioral: Positive for depression. The patient is nervous/anxious.     Blood pressure 122/81, pulse 74, temperature 98.1 F (36.7 C), temperature source Oral, resp. rate 16, height 6\' 1"  (1.854 m), weight 146.965 kg (324 lb).Body mass index is 42.75 kg/(m^2).  General Appearance: Casual  Eye Contact::  Fair  Speech:  Normal Rate  Volume:  Decreased  Mood:  Anxious and Depressed  Affect:  Congruent  Thought Process:  Disorganized  Orientation:  Full (Time, Place, and Person)  Thought Content:   WDL  Suicidal Thoughts:  Yes.  without intent/plan  Homicidal Thoughts:  No  Memory:  Immediate;   Fair Recent;   Fair Remote;   Fair  Judgement:  Fair  Insight:  Fair  Psychomotor Activity:  Decreased  Concentration:  Fair  Recall:  Fair  Akathisia:  No  Handed:  Right  AIMS (if indicated):     Assets:  Communication Skills Desire for Improvement Housing Social Support  Sleep:  Number of Hours: 6.75    Current Medications: Current Facility-Administered Medications  Medication Dose Route Frequency Provider Last Rate Last Dose  . acetaminophen (TYLENOL) tablet 650 mg  650 mg Oral Q6H PRN Kerry Hough, PA      . albuterol (PROVENTIL HFA;VENTOLIN HFA) 108 (90 BASE) MCG/ACT inhaler 2 puff  2 puff Inhalation Q6H PRN Kerry Hough, PA      . alum & mag hydroxide-simeth (MAALOX/MYLANTA) 200-200-20 MG/5ML suspension 30 mL  30 mL Oral Q4H PRN Kerry Hough, PA      . aspirin chewable tablet 81 mg  81 mg Oral Daily Nanine Means, NP   81 mg at 09/04/12 0824  . atorvastatin (LIPITOR) tablet 10 mg  10 mg Oral Daily Kerry Hough, PA   10 mg at 09/03/12 1732  . FLUoxetine (PROZAC) capsule 20 mg  20 mg Oral Daily Nanine Means, NP   20 mg at 09/04/12 0824  . gabapentin (NEURONTIN) capsule 400 mg  400 mg Oral QID Kerry Hough, PA  400 mg at 09/04/12 1246  . hydrochlorothiazide (HYDRODIURIL) tablet 25 mg  25 mg Oral Daily Nanine Means, NP   25 mg at 09/04/12 0824  . hydrOXYzine (ATARAX/VISTARIL) tablet 25 mg  25 mg Oral Q4H Nanine Means, NP   25 mg at 09/04/12 1441  . levothyroxine (SYNTHROID, LEVOTHROID) tablet 175 mcg  175 mcg Oral QAC breakfast Kerry Hough, PA   175 mcg at 09/04/12 0617  . lisinopril (PRINIVIL,ZESTRIL) tablet 20 mg  20 mg Oral BID Kerry Hough, PA   20 mg at 09/04/12 1610  . magnesium hydroxide (MILK OF MAGNESIA) suspension 30 mL  30 mL Oral Daily PRN Kerry Hough, PA      . nicotine (NICODERM CQ - dosed in mg/24 hours) patch 21 mg  21 mg Transdermal Q0600  Kerry Hough, PA   21 mg at 09/04/12 0617  . traZODone (DESYREL) tablet 200 mg  200 mg Oral QHS Kerry Hough, PA   200 mg at 09/03/12 2055    Lab Results:  No results found for this or any previous visit (from the past 48 hour(s)).  Physical Findings: AIMS: Facial and Oral Movements Muscles of Facial Expression: None, normal Lips and Perioral Area: None, normal Jaw: None, normal Tongue: None, normal,Extremity Movements Upper (arms, wrists, hands, fingers): None, normal Lower (legs, knees, ankles, toes): None, normal, Trunk Movements Neck, shoulders, hips: None, normal, Overall Severity Severity of abnormal movements (highest score from questions above): None, normal Incapacitation due to abnormal movements: None, normal Patient's awareness of abnormal movements (rate only patient's report): No Awareness, Dental Status Current problems with teeth and/or dentures?: No Does patient usually wear dentures?: No  CIWA:  CIWA-Ar Total: 3  COWS:  COWS Total Score: 1   Treatment Plan Summary: Daily contact with patient to assess and evaluate symptoms and progress in treatment Medication management  Plan:  Review of chart, vital signs, medications, and notes. 1-Admit for crisis management and stabilization.  Estimated length of stay 5-7 days past his current stay of 3 2-Individual and group therapy encouraged 3-Medication management for depression and anxiety to reduce current symptoms to base line and improve the patient's overall level of functioning:  Medications reviewed with the patient and he stated no untoward effects, Prozac increased, HCTZ added for his blood pressure, vistaril ordered for his anxiety 4-Coping skills for depression and anxiety developing-- 5-Continue crisis stabilization and management 6-Address health issues--monitoring blood pressures and HCTZ added for stability 7-Treatment plan in progress to prevent relapse of depression and anxiety 8-Psychosocial  education regarding relapse prevention and self-care 8-Health care follow up as needed for blood pressure and pain issues 9-Call for consult with hospitalist for additional specialty patient services as needed.  Medical Decision Making Problem Points:  Review of last therapy session (1) and Review of psycho-social stressors (1) Data Points:  Review of new medications or change in dosage (2) Add some Remeron at hs  I certify that inpatient services furnished can reasonably be expected to improve the patient's condition.   Nyja Westbrook,MICKIE D.RPA-C CAQ-Psych  09/04/2012, 5:06 PM

## 2012-09-04 NOTE — Progress Notes (Signed)
Adult Psychoeducational Group Note  Date:  09/04/2012 Time:  2000  Group Topic/Focus:  Wrap-Up Group:   The focus of this group is to help patients review their daily goal of treatment and discuss progress on daily workbooks.  Participation Level:  Active  Participation Quality:  Appropriate  Affect:  Appropriate  Cognitive:  Appropriate  Insight: Appropriate  Engagement in Group:  Engaged  Modes of Intervention:  Discussion  Additional Comments:    Briselda Naval A 09/04/2012, 4:39 AM

## 2012-09-04 NOTE — Progress Notes (Signed)
Adult Psychoeducational Group Note  Date:  09/04/2012 Time:  0900  Group Topic/Focus:  Coping With Mental Health Crisis:   The purpose of this group is to help patients identify strategies for coping with mental health crisis.  Group discusses possible causes of crisis and ways to manage them effectively.  Participation Level:  Active  Participation Quality:  Appropriate, Attentive and Supportive  Affect:  Depressed and Flat  Cognitive:  Alert and Appropriate  Insight: Appropriate  Engagement in Group:  Engaged and Supportive  Modes of Intervention:  Activity, Discussion, Education, Exploration and Support  Additional Comments:    Arturo Morton 09/04/2012, 10:16 AM

## 2012-09-04 NOTE — Progress Notes (Signed)
D: Appears flat, sad and depressed. Calm and cooperative with assessment. No acute distress. States he continues to feel suicidal and depressed. Does contract for safety. Rates his depression and hopelessness at a a "10" (severe). When asked about his medications he was able to name indications for his meds, but could not remember Names, dosages or frequency. 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 Denies SI/HI/AVH and contracts for safety. Offers no questions or concerns.  A: Safety has been maintained with Q15 min observation. Support and encouragement provided. Medications and POC for the shift reviewed and understanding verbalized. Encouraged pt to work on remembering his medication list in case of an emergency.  R: Pt is calm and cooperative. He reports feeling depressed and suicidal, but he does contract for safety. Will continue Q15 minute observation, continue to reinforce medication knowledge and continue current POC.

## 2012-09-04 NOTE — Progress Notes (Signed)
Adult Psychoeducational Group Note  Date:  09/04/2012 Time:  1315  Group Topic/Focus:  Emotional Education:   The focus of this group is to discuss what feelings/emotions are, and how they are experienced.  Participation Level:  Active  Participation Quality:  Appropriate  Affect:  Anxious and Defensive  Cognitive:  Alert and Appropriate  Insight: Limited  Engagement in Group:  Defensive  Modes of Intervention:  Activity, Discussion, Education, Exploration, Role-play and Support  Additional Comments:    Arturo Morton 09/04/2012, 3:09 PM

## 2012-09-05 MED ORDER — TRAZODONE HCL 150 MG PO TABS
300.0000 mg | ORAL_TABLET | Freq: Every day | ORAL | Status: DC
  Administered 2012-09-05 – 2012-09-07 (×3): 300 mg via ORAL
  Filled 2012-09-05: qty 2
  Filled 2012-09-05: qty 6
  Filled 2012-09-05 (×4): qty 2

## 2012-09-05 MED ORDER — MIRTAZAPINE 30 MG PO TBDP
30.0000 mg | ORAL_TABLET | Freq: Every evening | ORAL | Status: DC | PRN
  Administered 2012-09-05 – 2012-09-13 (×15): 30 mg via ORAL
  Filled 2012-09-05 (×23): qty 1

## 2012-09-05 MED ORDER — QUETIAPINE FUMARATE ER 50 MG PO TB24
100.0000 mg | ORAL_TABLET | Freq: Every day | ORAL | Status: DC
  Administered 2012-09-05: 100 mg via ORAL
  Filled 2012-09-05 (×3): qty 2

## 2012-09-05 NOTE — Progress Notes (Signed)
[facility] Group Notes:  (Nursing/MHT/Case Management/Adjunct)  Date:  09/05/2012  Time:  1:40 AM  Type of Therapy:  Psychoeducational Skills  Participation Level:  Minimal  Participation Quality:  Attentive  Affect:  Depressed  Cognitive:  Appropriate  Insight:  Good  Engagement in Group:  Limited  Modes of Intervention:  Education  Summary of Progress/Problems:The patient stated in group that he didn't have a very good day for a number of reasons. First of all, the patient admitted to having suicidal thoughts. Secondly, he rated his depression as a 10 out of 10.  Next, he stated that he was having difficulty sleeping at night and had an issue with his C-pap machine. He was brighter when stating that he had a good family visit this evening. His goal for tomorrow is to get through the day.   Terrianna Holsclaw S 09/05/2012, 1:40 AM

## 2012-09-05 NOTE — Progress Notes (Signed)
D:  Rodney Leach reports that he had difficulty sleeping.  He states that he has "constant" SI at this time, but he does contract for safety.  He rates depression at 8/10 and hopelessness at 8/10.He slept most of the morning, but did get up to attend morning group. A: Medications given as ordered.  Safety checks q 15 minutes.  Emotional support provided. R:  Safety maintained on unit.

## 2012-09-05 NOTE — Progress Notes (Signed)
Patient ID: Rodney Leach, male   DOB: 19-Feb-1961, 52 y.o.   MRN: [id number] [facility] MD Progress Note  09/05/2012 3:58 PM Rodney Leach  MRN:  [id number] Subjective:  10/10 depression and anxiety with suicidal ideations and then earlier this afternoon he felt hopeful. Struggles to get top sleep and stay asleep. Also still has racing thoughts although they have decreased some.  Diagnosis:   Axis I: Anxiety Disorder NOS and Depressive Disorder NOS  Bipolar I Disorder, racing thoughts  Axis II: Deferred Axis III:  Past Medical History  Diagnosis Date  . Stroke     "light" stroke  . Hypothyroidism   . Sleep apnea     CPAP  . Headache   . Arthritis   . Complication of anesthesia     has woken up during surgery before   . Hypertension   . Bipolar affective    Axis IV: occupational problems, other psychosocial or environmental problems, problems related to social environment and problems with primary support group Axis V: 41-50 serious symptoms  ADL's:  Intact  Sleep: Fair; has sleep apnea and cannot sleep well  Appetite:  Good  Suicidal Ideation:  Plan:  None Intent:  None Means:  None Homicidal Ideation:  Denies  Psychiatric Specialty Exam: Review of Systems  Constitutional: Negative.   HENT: Negative.   Eyes: Negative.   Respiratory: Negative.   Cardiovascular: Negative.   Gastrointestinal: Negative.   Genitourinary: Negative.   Musculoskeletal: Negative.   Skin: Negative.   Neurological: Negative.   Endo/Heme/Allergies: Negative.   Psychiatric/Behavioral: Positive for depression. The patient is nervous/anxious.     Blood pressure 125/84, pulse 77, temperature 97.7 F (36.5 C), temperature source Oral, resp. rate 16, height 6\' 1"  (1.854 m), weight 146.965 kg (324 lb).Body mass index is 42.75 kg/(m^2).  General Appearance: Casual  Eye Contact::  Fair  Speech:  Normal Rate  Volume:  Normal  Mood:  Anxious and Depressed  Affect:  Congruent  Thought Process:   Disorganized  Orientation:  Full (Time, Place, and Person)  Thought Content:  WDL  Suicidal Thoughts:  Yes.  without intent/plan  Homicidal Thoughts:  No  Memory:  Immediate;   Fair Recent;   Fair Remote;   Fair  Judgement:  Fair  Insight:  Fair  Psychomotor Activity:  Decreased  Concentration:  Fair  Recall:  Fair  Akathisia:  No  Handed:  Right  AIMS (if indicated):     Assets:  Communication Skills Desire for Improvement Housing Social Support  Sleep: 3 hours   Current Medications: Current Facility-Administered Medications  Medication Dose Route Frequency Provider Last Rate Last Dose  . acetaminophen (TYLENOL) tablet 650 mg  650 mg Oral Q6H PRN Kerry Hough, PA      . albuterol (PROVENTIL HFA;VENTOLIN HFA) 108 (90 BASE) MCG/ACT inhaler 2 puff  2 puff Inhalation Q6H PRN Kerry Hough, PA      . alum & mag hydroxide-simeth (MAALOX/MYLANTA) 200-200-20 MG/5ML suspension 30 mL  30 mL Oral Q4H PRN Kerry Hough, PA      . aspirin chewable tablet 81 mg  81 mg Oral Daily Nanine Means, NP   81 mg at 09/05/12 0801  . atorvastatin (LIPITOR) tablet 10 mg  10 mg Oral Daily Kerry Hough, PA   10 mg at 09/04/12 1830  . FLUoxetine (PROZAC) capsule 20 mg  20 mg Oral Daily Nanine Means, NP   20 mg at 09/05/12 0801  . gabapentin (NEURONTIN) capsule 400  mg  400 mg Oral QID Kerry Hough, PA   400 mg at 09/05/12 1248  . hydrochlorothiazide (HYDRODIURIL) tablet 25 mg  25 mg Oral Daily Nanine Means, NP   25 mg at 09/05/12 0801  . hydrOXYzine (ATARAX/VISTARIL) tablet 25 mg  25 mg Oral Q4H Nanine Means, NP   25 mg at 09/05/12 1248  . levothyroxine (SYNTHROID, LEVOTHROID) tablet 175 mcg  175 mcg Oral QAC breakfast Kerry Hough, PA   175 mcg at 09/05/12 0644  . lisinopril (PRINIVIL,ZESTRIL) tablet 20 mg  20 mg Oral BID Kerry Hough, PA   20 mg at 09/05/12 0801  . magnesium hydroxide (MILK OF MAGNESIA) suspension 30 mL  30 mL Oral Daily PRN Kerry Hough, PA      . mirtazapine  (REMERON SOL-TAB) disintegrating tablet 15 mg  15 mg Oral QHS,MR X 1 Fredrik Cove, PA-C   15 mg at 09/04/12 2245  . nicotine (NICODERM CQ - dosed in mg/24 hours) patch 21 mg  21 mg Transdermal Q0600 Kerry Hough, PA   21 mg at 09/05/12 0645  . traZODone (DESYREL) tablet 200 mg  200 mg Oral QHS Kerry Hough, PA   200 mg at 09/04/12 2141    Lab Results:  No results found for this or any previous visit (from the past 48 hour(s)).  Physical Findings: AIMS: Facial and Oral Movements Muscles of Facial Expression: None, normal Lips and Perioral Area: None, normal Jaw: None, normal Tongue: None, normal,Extremity Movements Upper (arms, wrists, hands, fingers): None, normal Lower (legs, knees, ankles, toes): None, normal, Trunk Movements Neck, shoulders, hips: None, normal, Overall Severity Severity of abnormal movements (highest score from questions above): None, normal Incapacitation due to abnormal movements: None, normal Patient's awareness of abnormal movements (rate only patient's report): No Awareness, Dental Status Current problems with teeth and/or dentures?: No Does patient usually wear dentures?: No  CIWA:  CIWA-Ar Total: 3  COWS:  COWS Total Score: 1   Treatment Plan Summary: Daily contact with patient to assess and evaluate symptoms and progress in treatment Medication management  Plan:  Review of chart, vital signs, medications, and notes. 1-Admit for crisis management and stabilization.  Estimated length of stay 5-7 days past his current stay of 4 2-Individual and group therapy encouraged 3-Medication management for depression and anxiety to reduce current symptoms to base line and improve the patient's overall level of functioning:  Medications reviewed with the patient and he stated no untoward effects, Prozac increased, HCTZ added for his blood pressure, vistaril ordered for his anxiety 4-Coping skills for depression and anxiety developing-- 5-Continue crisis  stabilization and management 6-Address health issues--monitoring blood pressures and HCTZ added for stability 7-Treatment plan in progress to prevent relapse of depression and anxiety 8-Psychosocial education regarding relapse prevention and self-care 8-Health care follow up as needed for blood pressure and pain issues 9-Call for consult with hospitalist for additional specialty patient services as needed.  Medical Decision Making Problem Points:  Review of last therapy session (1) and Review of psycho-social stressors (1) Data Points:  Review of new medications or change in dosage (2) Seroquel XR 50 mg 2 tabs for racing thoughts, not to combine with sleep meds:  incr of Remeron to 30 mg + trazodone 300 mg at HS to improve duration of sleep. I certify that inpatient services furnished can reasonably be expected to improve the patient's condition.   Mickeal Skinner MD 09/05/2012, 3:58 PM

## 2012-09-05 NOTE — Progress Notes (Signed)
Writer spoke with patient and inquired about how his day has been. Patient reports his doctor added seroquel to his medications and feels that he is having to take a lot of medications in order to sleep. Patient reports that when he tries to rest his mind starts racing. Patient reports that he has constant pain in his lower back and left knee and was offered tylenol and ice/heat packs and he refused then all saying that his pain is tolerable and he lives with it daily. Patient is pleasant but sad when he reports having suicidal thoughts off and on and he verbally contracts with Clinical research associate. Patient offered support and encouragement, safety maintained on unit, will continue to monitor.

## 2012-09-05 NOTE — Progress Notes (Signed)
[facility] Group Notes:  (Nursing/MHT/Case Management/Adjunct)  Date:  09/05/2012  Time:  2000  Type of Therapy:  Psychoeducational Skills  Participation Level:  Active  Participation Quality:  Attentive  Affect:  Depressed  Cognitive:  Appropriate  Insight:  Improving  Engagement in Group:  Improving  Modes of Intervention:  Education  Summary of Progress/Problems: When asked about his day, he described it the following way: "it was long and confusing". He seemed a bit optimistic when he mentioned that some new medication had been prescribed. His goal for tomorrow is to "take one step in the right direction".   Rodney Leach S 09/05/2012, 10:34 PM

## 2012-09-05 NOTE — Progress Notes (Signed)
Adult Psychoeducational Group Note  Date: 09/05/2012  Time: 1315  Group Topic/Focus: Support Systems  Participation Level: Active  Participation Quality: Appropriate and Sharing  Affect: Appropriate  Cognitive: Alert  Insight: Appropriate  Engagement in Group: Supportive  Modes of Intervention: Activity, Discussion, Education, Exploration, Role-play and Support   

## 2012-09-05 NOTE — Clinical Social Work Psychosocial (Signed)
[facility] Group Notes:  (Clinical Social Work)  09/05/2012   3-4pm  Summary of Progress/Problems:  The main focus of today's process group was to listen to a variety of genres of music and to identify that different types of music provoke different responses.  The patient then was able to identify personally what was soothing for them, as well as energizing.  Examples were given of how to use this knowledge in sleep habits, with depression, and with other symptoms.  The patient expressed some positive feelings over some of the music, but mostly it did not make him feel anything.  Type of Therapy:  Music Therapy with processing done  Participation Level:  Minimal  Participation Quality:  Attentive  Affect:  Blunted and Depressed  Cognitive:  Oriented  Insight:  Engaged  Engagement in Therapy:  Engaged  Modes of Intervention:   Socialization, Teacher, English as a foreign language, Exploration, Education, Rapport Building   [employer], LCSW 09/05/2012, 5:09 PM

## 2012-09-05 NOTE — Progress Notes (Signed)
Adult Psychoeducational Group Note  Date:  09/05/2012 Time:  10:40 AM  Group Topic/Focus:  Spirituality:   The focus of this group is to discuss how one's spirituality can aide in recovery.  Participation Level:  Active  Participation Quality:  Appropriate  Affect:  Appropriate  Cognitive:  Appropriate  Insight:   Engagement in Group:     Modes of Intervention:    Additional Comments:   Cresenciano Lick 09/05/2012, 10:40 AM

## 2012-09-06 MED ORDER — QUETIAPINE FUMARATE ER 200 MG PO TB24
200.0000 mg | ORAL_TABLET | Freq: Every day | ORAL | Status: DC
  Administered 2012-09-06 – 2012-09-08 (×3): 200 mg via ORAL
  Filled 2012-09-06 (×5): qty 1

## 2012-09-06 NOTE — Progress Notes (Signed)
[facility] MD Progress Note  09/06/2012 1:33 PM Rodney Leach  MRN:  [id number] Subjective:  Patient reporting depressed mood, suicidal thoughts with plan to overdose. Having racing thoughts, difficulty sleeping.  Diagnosis:   Axis I: Bipolar disorder Axis II: Deferred Axis III:  Past Medical History  Diagnosis Date  . Stroke     "light" stroke  . Hypothyroidism   . Sleep apnea     CPAP  . Headache   . Arthritis   . Complication of anesthesia     has woken up during surgery before   . Hypertension   . Bipolar affective    Axis IV: other psychosocial or environmental problems Axis V: 51-60 moderate symptoms  ADL's:  Intact  Sleep: Fair  Appetite:  Fair  Psychiatric Specialty Exam: Review of Systems  Constitutional: Negative.   HENT: Negative.   Eyes: Negative.   Respiratory: Negative.   Cardiovascular: Negative.   Gastrointestinal: Negative.   Genitourinary: Negative.   Musculoskeletal: Negative.   Skin: Negative.   Neurological: Negative.   Endo/Heme/Allergies: Negative.   Psychiatric/Behavioral: Positive for depression.    Blood pressure 125/79, pulse 79, temperature 97.7 F (36.5 C), temperature source Oral, resp. rate 18, height 6\' 1"  (1.854 m), weight 146.965 kg (324 lb).Body mass index is 42.75 kg/(m^2).  General Appearance: Casual  Eye Contact::  Fair  Speech:  Slow  Volume:  Decreased  Mood:  Depressed  Affect:  Blunt  Thought Process:  Circumstantial  Orientation:  Full (Time, Place, and Person)  Thought Content:  WDL  Suicidal Thoughts:  Yes.  with intent/plan  Homicidal Thoughts:  No  Memory:  Immediate;   Fair Recent;   Fair Remote;   Fair  Judgement:  Fair  Insight:  Fair  Psychomotor Activity:  Decreased  Concentration:  Fair  Recall:  Fair  Akathisia:  No  Handed:  Right  AIMS (if indicated):     Assets:  Communication Skills Desire for Improvement Housing Social Support  Sleep:  Number of Hours: 6    Current Medications: Current  Facility-Administered Medications  Medication Dose Route Frequency Provider Last Rate Last Dose  . acetaminophen (TYLENOL) tablet 650 mg  650 mg Oral Q6H PRN Kerry Hough, PA      . albuterol (PROVENTIL HFA;VENTOLIN HFA) 108 (90 BASE) MCG/ACT inhaler 2 puff  2 puff Inhalation Q6H PRN Kerry Hough, PA      . alum & mag hydroxide-simeth (MAALOX/MYLANTA) 200-200-20 MG/5ML suspension 30 mL  30 mL Oral Q4H PRN Kerry Hough, PA      . aspirin chewable tablet 81 mg  81 mg Oral Daily Nanine Means, NP   81 mg at 09/06/12 0830  . atorvastatin (LIPITOR) tablet 10 mg  10 mg Oral Daily Kerry Hough, PA   10 mg at 09/05/12 1815  . FLUoxetine (PROZAC) capsule 20 mg  20 mg Oral Daily Nanine Means, NP   20 mg at 09/06/12 0831  . gabapentin (NEURONTIN) capsule 400 mg  400 mg Oral QID Kerry Hough, PA   400 mg at 09/06/12 1146  . hydrochlorothiazide (HYDRODIURIL) tablet 25 mg  25 mg Oral Daily Nanine Means, NP   25 mg at 09/06/12 0831  . hydrOXYzine (ATARAX/VISTARIL) tablet 25 mg  25 mg Oral Q4H Nanine Means, NP   25 mg at 09/06/12 0933  . levothyroxine (SYNTHROID, LEVOTHROID) tablet 175 mcg  175 mcg Oral QAC breakfast Kerry Hough, PA   175 mcg at 09/06/12 0646  .  lisinopril (PRINIVIL,ZESTRIL) tablet 20 mg  20 mg Oral BID Kerry Hough, PA   20 mg at 09/06/12 1610  . magnesium hydroxide (MILK OF MAGNESIA) suspension 30 mL  30 mL Oral Daily PRN Kerry Hough, PA      . mirtazapine (REMERON SOL-TAB) disintegrating tablet 30 mg  30 mg Oral QHS,MR X 1 Mickeal Skinner, MD   30 mg at 09/05/12 2209  . nicotine (NICODERM CQ - dosed in mg/24 hours) patch 21 mg  21 mg Transdermal Q0600 Kerry Hough, PA   21 mg at 09/06/12 0647  . QUEtiapine (SEROQUEL XR) 24 hr tablet 100 mg  100 mg Oral QPC supper Mickeal Skinner, MD   100 mg at 09/05/12 1815  . traZODone (DESYREL) tablet 300 mg  300 mg Oral QHS Mickeal Skinner, MD   300 mg at 09/05/12 2113    Lab Results: No results found for this or any previous  visit (from the past 48 hour(s)).  Physical Findings: AIMS: Facial and Oral Movements Muscles of Facial Expression: None, normal Lips and Perioral Area: None, normal Jaw: None, normal Tongue: None, normal,Extremity Movements Upper (arms, wrists, hands, fingers): None, normal Lower (legs, knees, ankles, toes): None, normal, Trunk Movements Neck, shoulders, hips: None, normal, Overall Severity Severity of abnormal movements (highest score from questions above): None, normal Incapacitation due to abnormal movements: None, normal Patient's awareness of abnormal movements (rate only patient's report): No Awareness, Dental Status Current problems with teeth and/or dentures?: No Does patient usually wear dentures?: No  CIWA:  CIWA-Ar Total: 3  COWS:  COWS Total Score: 1   Treatment Plan Summary: Daily contact with patient to assess and evaluate symptoms and progress in treatment Medication management  Plan: Adjust medications to address mood. Continue current plan of care. Encouraged to attend groups.  Medical Decision Making Problem Points:  Established problem, stable/improving (1), Review of last therapy session (1) and Review of psycho-social stressors (1) Data Points:  Review of medication regiment & side effects (2) Review of new medications or change in dosage (2)  I certify that inpatient services furnished can reasonably be expected to improve the patient's condition.   Minami Arriaga 09/06/2012, 1:33 PM

## 2012-09-06 NOTE — Clinical Social Work Note (Signed)
[facility] LCSW Aftercare Discharge Planning Group Note       8:30-9:30 AM  1/27/20143:49 PM  Participation Quality:  Appropriate  Affect:  Appropriate, Depressed, Flat  Cognitive:  Appropriate, Alert  Insight:  Engaged  Engagement in Group:  Engaged  Modes of Intervention:  Education, Exploration, Problem-solving and Support  Summary of Progress/Problems:  Patient continues to endorse SI but contracts for safety.  He stated he is yet adjusting to medication changes.  He rates all symptoms at eight.  Patient shared he plans to keep appointments and stay on medications when discharged.  [name] 09/06/2012 3:49 PM

## 2012-09-06 NOTE — Progress Notes (Signed)
[facility] LCSW Group Therapy  09/06/2012 3:51 PM  Type of Therapy:  Group Therapy  Participation Level:  Active  Participation Quality:  Appropriate  Affect:  Appropriate, Blunted, Depressed and Flat  Cognitive:  Alert and Appropriate  Insight:  Engaged  Engagement in Therapy:  Engaged  Modes of Intervention:  Confrontation, Discussion, Rapport Building and Support  Summary of Progress/Problems:  Patient shared the obstacle he needs to overcome is himself.  He stated he needs to learn to put himself first and not feel guilty.  He also stated he needs to stay on medications and try to get a prayer through.  [name] 09/06/2012, 3:51 PM

## 2012-09-06 NOTE — Progress Notes (Signed)
Adult Psychoeducational Group Note  Date:  09/06/2012 Time:  8:00PM  Group Topic/Focus:  Diagnosis Education:   The focus of this group is to discuss the major disorders that patients maybe diagnosed with.  Group discusses the importance of knowing what one's diagnosis is so that one can understand treatment and better advocate for oneself. Managing Feelings:   The focus of this group is to identify what feelings patients have difficulty handling and develop a plan to handle them in a healthier way upon discharge.  Participation Level:  Active  Participation Quality:  Appropriate  Affect:  Flat  Cognitive:  Alert and Oriented  Insight: Appropriate  Engagement in Group:  Developing/Improving  Modes of Intervention:  Clarification, Exploration, Problem-solving and Support  Additional Comments:  Pt rated his day as a 3. Pt stated that two coping strategies that he had to use today were to accept that the older he gets the worse his diease becomes and to be his own advocate. Pt stated that today he had to turn his anger inwardly instead of acting on it. Pt stated that two coping strategies that he was able to use was to remain calm, polite and to take deep breaths.  Dannell Raczkowski, Randal Buba 09/06/2012, 10:12 PM

## 2012-09-06 NOTE — Progress Notes (Signed)
Adult Psychoeducational Group Note  Date:  09/06/2012 Time:  1100 Group Topic/Focus:  Self Care:   The focus of this group is to help patients understand the importance of self-care in order to improve or restore emotional, physical, spiritual, interpersonal, and financial health.  Participation Level:  Active  Participation Quality:  Appropriate and Attentive  Affect:  Appropriate  Cognitive:  Appropriate  Insight: Appropriate and Good  Engagement in Group:  Engaged  Modes of Intervention:  Education  Additional Comments:  Staff explained to the patient that this group is designed to assist in identifying activities that they will incorporate into their daily living with the intention of improving or restoring emotional, physical, spiritual, interpersonal and financial health. The patients were asked to identify one area or skill where they are taking care of themselves. Patients will identify 2-3 activities of self- care that they will use in their daily living after discharge. Patients were encouraged to utilize the skills and techniques taught and apply it in their daily routine.    Ardelle Park O 09/06/2012, 7:21 PM

## 2012-09-06 NOTE — Progress Notes (Signed)
Reviewed

## 2012-09-06 NOTE — Progress Notes (Signed)
D:   Patient's self inventory sheet, patient sleeps fair, has good appetite, low energy level, improving attention span.  Rated depression and hopelessness #8.   Denied withdrawals.  SI, contracts for safety.   Denied withdrawals.  Has experienced pain, headaches in past 24 hours.  Pain goal today #7, worst pain #8.  After discharge, "will stop and think just stop" .  Needs help with discharge plans.  Unsure of discharge plans.  No problems buying medications after discharge.   A:  Medications administered per MD order.  Support and encouragement given throughout day.  Support and safety checks completed as ordered. R:  Following treatment plans.  Denied HI.   Denied A/V hallucinations.  SI, contracts for safety.  Patient remains safe and receptive on medications.

## 2012-09-06 NOTE — Tx Team (Signed)
Interdisciplinary Treatment Plan Update (Adult)  Date:  09/06/2012  Time Reviewed:  9:52 AM   Progress in Treatment: Attending groups:   Yes   Participating in groups:  Yes Taking medication as prescribed:  Yes Tolerating medication:  Yes Family/Significant othe contact made: Contact to be made with family Patient understands diagnosis:  Yes Discussing patient identified problems/goals with staff: Yes Medical problems stabilized or resolved: Yes Denies suicidal/homicidal ideation:No but contracts for safety Issues/concerns per patient self-inventory:  Other:   New problem(s) identified:  Reason for Continuation of Hospitalization: Anxiety Depression Medication stabilization Suicidal ideation  Interventions implemented related to continuation of hospitalization:  Medication Management; safety checks q 15 mins  Additional comments:  Estimated length of stay:  2-3 days  Discharge Plan:  Home with outpatient follow up  New goal(s):  Review of initial/current patient goals per problem list:    1.  Goal(s): Eliminate SI/other thoughts of self harm (Patient will no longer endorse SI/other thought self harm)    Met:  No  Target date: d/c  As evidenced by: Patient continues to endorse SI today but able to contact for safety)    2.  Goal (s):Reduce depression/anxiety (Patient will rate symptoms at four or below)  Met:  No  Target date: d/c  As evidenced by: Patient rates symptoms at eight today)    3.  Goal(s):.stabilize on meds (Patient will report being stable on medications - less symptomatic)   Met:  No  Target date: d/c  As evidenced by: Patient will report being stabilized on medications - less symptomatic    4.  Goal(s): Refer for outpatient follow up (Follow up to be scheduled)   Met:  No  Target date: d/c  As evidenced by: Follow up appointment is not scheduled    Attendees: Patient:   09/06/2012 9:52 AM  Physican: Patrick North, MD  09/06/2012 9:52 AM  Nursing:    Neill Loft, RN 09/06/2012 9:52 AM   Nursing:    Quintella Reichert, RN 09/06/2012 9:52 AM   Clinical Social Worker:  Juline Patch, LCSW 09/06/2012 9:52 AM   Other:   Arley Phenix, PHM-NP 09/06/2012 9:52 AM   Other:        09/06/2012 9:52 AM Other:        09/06/2012 9:52 AM

## 2012-09-07 NOTE — Progress Notes (Signed)
Adult Psychoeducational Group Note  Date:  09/07/2012 Time:  3:10 PM  Group Topic/Focus:  Recovery Goals:   The focus of this group is to identify appropriate goals for recovery and establish a plan to achieve them.  Participation Level:  Active  Participation Quality:  Appropriate, Attentive, Sharing and Supportive  Affect:  Appropriate  Cognitive:  Appropriate  Insight: Appropriate  Engagement in Group:  Engaged  Modes of Intervention:  Discussion, Education and Support  Additional Comments:  [name] attended and participated in recovery goals group. Patient stated the definition of recovery. Patient explained a situation of a crisis and how to cope with the situation and how to prevent and improve if the situation occurred. Patient also completed the workbook form on personal recovery goals on what two changes patient would like to change in the stage of recovery and how the patient can make those changes.    Rodney Leach 09/07/2012, 3:10 PM

## 2012-09-07 NOTE — Progress Notes (Signed)
D: Patient denies HI and A/V hallucinations and admits to thoughts of SI; patient reports sleep to be fair but its been hard for him to sleep because of the racing thoughts; reports appetite to be good ; reports energy level is low ; reports ability to pay attention is improving; rates depression as -7/10; rates hopelessness 7/10; rates anxiety as 10/10; patient has complaints of anxiousness but he complains of racing thoughts and the physician is aware and patient has scheduled anxiety medication  A: Monitored q 15 minutes; patient encouraged to attend groups; patient educated about medications; patient given medications per physician orders; patient encouraged to express feelings and/or concerns  R: Patient is calm on the unit but has complained of racing thoughts and that some of the activities he tries helps alittle but not much; patient's interaction with staff and peers is appropriate; patient was able to set goal to talk with staff 1:1 when having feelings of SI; patient is taking medications as prescribed and tolerating medications; patient is attending all groups

## 2012-09-07 NOTE — Progress Notes (Signed)
[facility] MD Progress Note  09/07/2012 10:20 AM Rodney Leach  MRN:  [id number] Subjective:  Patient reports he is tolerating the increased dose of seroquel well, mood slightly improved. Continues to have trouble sleeping.  Diagnosis:   Axis I: Bipolar, mixed Axis II: Deferred Axis III:  Past Medical History  Diagnosis Date  . Stroke     "light" stroke  . Hypothyroidism   . Sleep apnea     CPAP  . Headache   . Arthritis   . Complication of anesthesia     has woken up during surgery before   . Hypertension   . Bipolar affective    Axis IV: other psychosocial or environmental problems Axis V: 51-60 moderate symptoms  ADL's:  Intact  Sleep: Poor  Appetite:  Fair   Psychiatric Specialty Exam: Review of Systems  Constitutional: Negative.   HENT: Negative.   Eyes: Negative.   Respiratory: Negative.   Cardiovascular: Negative.   Gastrointestinal: Negative.   Genitourinary: Negative.   Musculoskeletal: Negative.   Skin: Negative.   Neurological: Negative.   Endo/Heme/Allergies: Negative.   Psychiatric/Behavioral: Positive for depression. The patient is nervous/anxious.     Blood pressure 128/79, pulse 77, temperature 97.5 F (36.4 C), temperature source Oral, resp. rate 16, height 6\' 1"  (1.854 m), weight 146.965 kg (324 lb).Body mass index is 42.75 kg/(m^2).  General Appearance: Casual  Eye Contact::  Fair  Speech:  Slow  Volume:  Decreased  Mood:  Depressed and Dysphoric  Affect:  Constricted  Thought Process:  Coherent  Orientation:  Full (Time, Place, and Person)  Thought Content:  WDL  Suicidal Thoughts:  Yes.  without intent/plan  Homicidal Thoughts:  No  Memory:  Immediate;   Fair Recent;   Fair Remote;   Fair  Judgement:  Fair  Insight:  Fair  Psychomotor Activity:  Decreased  Concentration:  Fair  Recall:  Fair  Akathisia:  No  Handed:  Right  AIMS (if indicated):     Assets:  Communication Skills Desire for Improvement Housing Social Support    Sleep:  Number of Hours: 6.5    Current Medications: Current Facility-Administered Medications  Medication Dose Route Frequency Provider Last Rate Last Dose  . acetaminophen (TYLENOL) tablet 650 mg  650 mg Oral Q6H PRN Kerry Hough, PA      . albuterol (PROVENTIL HFA;VENTOLIN HFA) 108 (90 BASE) MCG/ACT inhaler 2 puff  2 puff Inhalation Q6H PRN Kerry Hough, PA      . alum & mag hydroxide-simeth (MAALOX/MYLANTA) 200-200-20 MG/5ML suspension 30 mL  30 mL Oral Q4H PRN Kerry Hough, PA      . aspirin chewable tablet 81 mg  81 mg Oral Daily Nanine Means, NP   81 mg at 09/07/12 0750  . atorvastatin (LIPITOR) tablet 10 mg  10 mg Oral Daily Kerry Hough, PA   10 mg at 09/06/12 1728  . FLUoxetine (PROZAC) capsule 20 mg  20 mg Oral Daily Nanine Means, NP   20 mg at 09/07/12 0750  . gabapentin (NEURONTIN) capsule 400 mg  400 mg Oral QID Kerry Hough, PA   400 mg at 09/07/12 0750  . hydrochlorothiazide (HYDRODIURIL) tablet 25 mg  25 mg Oral Daily Nanine Means, NP   25 mg at 09/07/12 0750  . hydrOXYzine (ATARAX/VISTARIL) tablet 25 mg  25 mg Oral Q4H Nanine Means, NP   25 mg at 09/07/12 0930  . levothyroxine (SYNTHROID, LEVOTHROID) tablet 175 mcg  175 mcg Oral QAC  breakfast Kerry Hough, PA   175 mcg at 09/07/12 1610  . lisinopril (PRINIVIL,ZESTRIL) tablet 20 mg  20 mg Oral BID Kerry Hough, PA   20 mg at 09/07/12 9604  . magnesium hydroxide (MILK OF MAGNESIA) suspension 30 mL  30 mL Oral Daily PRN Kerry Hough, PA      . mirtazapine (REMERON SOL-TAB) disintegrating tablet 30 mg  30 mg Oral QHS,MR X 1 Mickeal Skinner, MD   30 mg at 09/06/12 2157  . nicotine (NICODERM CQ - dosed in mg/24 hours) patch 21 mg  21 mg Transdermal Q0600 Kerry Hough, PA   21 mg at 09/07/12 5409  . QUEtiapine (SEROQUEL XR) 24 hr tablet 200 mg  200 mg Oral QPC supper Siennah Barrasso, MD   200 mg at 09/06/12 1730  . traZODone (DESYREL) tablet 300 mg  300 mg Oral QHS Mickeal Skinner, MD   300 mg at 09/06/12 2157     Lab Results: No results found for this or any previous visit (from the past 48 hour(s)).  Physical Findings: AIMS: Facial and Oral Movements Muscles of Facial Expression: None, normal Lips and Perioral Area: None, normal Jaw: None, normal Tongue: None, normal,Extremity Movements Upper (arms, wrists, hands, fingers): None, normal Lower (legs, knees, ankles, toes): None, normal, Trunk Movements Neck, shoulders, hips: None, normal, Overall Severity Severity of abnormal movements (highest score from questions above): None, normal Incapacitation due to abnormal movements: None, normal Patient's awareness of abnormal movements (rate only patient's report): No Awareness, Dental Status Current problems with teeth and/or dentures?: No Does patient usually wear dentures?: No  CIWA:  CIWA-Ar Total: 0  COWS:  COWS Total Score: 0   Treatment Plan Summary: Daily contact with patient to assess and evaluate symptoms and progress in treatment Medication management  Plan: Continue current plan of care. Continue to monitor mood symptoms.  Medical Decision Making Problem Points:  Established problem, stable/improving (1), Review of last therapy session (1) and Review of psycho-social stressors (1) Data Points:  Review of medication regiment & side effects (2)  I certify that inpatient services furnished can reasonably be expected to improve the patient's condition.   Rodney Leach 09/07/2012, 10:20 AM

## 2012-09-07 NOTE — Progress Notes (Signed)
[facility] LCSW Group Therapy  Feelings Around Diagnosis 1:15 - 2:30 PM  09/07/2012 2:43 PM  Type of Therapy:  Group Therapy  Participation Level:  Active  Participation Quality:  Appropriate  Affect:  Appropriate, Blunted, Depressed and Flat  Cognitive:  Alert and Appropriate  Insight:  Engaged  Engagement in Therapy:  Engaged  Modes of Intervention:  Confrontation, Discussion, Rapport Building and Support  Summary of Progress/Problems:  Patient when he was diagnosed with Bipolar Disorder and Alcohol Dependence, he lost it.  He stated it was not who he want to be or be like that.  He stated he has ignored it for years but now he is forced to deal with his problems and he can not allow himself to focus on what other thinks about him.  [name] 09/07/2012, 2:43 PM

## 2012-09-07 NOTE — Progress Notes (Signed)
Patient ID: Rodney Leach, male   DOB: Mar 12, 1961, 52 y.o.   MRN: [id number] D: pt. Reports "racing thoughts, suicidal thought"  Pt. Reports depression at "8" of 10.  "If I could just get some sleep" A: Writer introduced self to client and provided support by listening.  A: Staff will continue safety checks q7min . Writer to review meds for changes.  Meds administered as ordered. Writer encouraged groups. R: Pt. Is safe on the unit. Pt. Attended group and participated.

## 2012-09-07 NOTE — Progress Notes (Signed)
Adult Psychoeducational Group Note  Date:  09/07/2012 Time: 2000 Group Topic/Focus:  Wrap-up  Participation Level:  Active  Participation Quality:  Appropriate  Affect:  Blunted and Flat  Cognitive:  Alert and Appropriate  Insight: Good  Engagement in Group:  Engaged  Modes of Intervention:  Support  Additional Comments:  Pt stated that he is continuing his goal of "working on his depression".   Humberto Seals Monique 09/07/2012, 11:27 PM

## 2012-09-07 NOTE — Clinical Social Work Note (Signed)
[facility] LCSW Aftercare Discharge Planning Group Note       8:30-9:30 AM  1/28/201411:30 AM  Participation Quality:  Appropriate  Affect:  Appropriate, Depressed, Flat  Cognitive:  Appropriate, Alert  Insight:  Engaged  Engagement in Group:  Engaged  Modes of Intervention:  Education, Exploration, Problem-solving and Support  Summary of Progress/Problems:  Patient continues to endorse SI but contracts for safety. rates all symptoms at eight. Patient provided with workbook for groups.  [name] 09/07/2012 11:30 AM

## 2012-09-07 NOTE — Progress Notes (Signed)
Grief and loss group Group discussed their losses and coping in various areas of their life. The themes of the loss of self and parental relationships in their life was discussed.   Patient discussed the multiple losses in his life including the loss of career opportunities, and the relationship with his father and children. Patient discussed deterrents and supports to healing.  Sherol Dade Counseling Intern Haroldine Laws

## 2012-09-08 MED ORDER — DIPHENHYDRAMINE HCL 25 MG PO CAPS
50.0000 mg | ORAL_CAPSULE | Freq: Four times a day (QID) | ORAL | Status: DC | PRN
  Administered 2012-09-08 – 2012-09-14 (×5): 50 mg via ORAL
  Filled 2012-09-08: qty 2

## 2012-09-08 NOTE — Progress Notes (Signed)
[facility] INPATIENT:  Family/Significant Other Suicide Prevention Education  Suicide Prevention Education:  Contact Attempts: Arboriculturist, Wife, , (name of family member/significant other) has been identified by the patient as the family member/significant other with whom the patient will be residing, and identified as the person(s) who will aid the patient in the event of a mental health crisis.  With written consent from the patient, two attempts were made to provide suicide prevention education, prior to and/or following the patient's discharge.  We were unsuccessful in providing suicide prevention education.  A suicide education pamphlet was given to the patient to share with family/significant other.  Date and time of first attempt:09/08/12 at 12:54 PM Date and time of second attempt:  [name] 09/08/2012, 12:53 PM

## 2012-09-08 NOTE — Progress Notes (Signed)
Adult Psychoeducational Group Note  Date:  09/08/2012 Time:  11:59 AM  Group Topic/Focus:  Crisis Planning:   The purpose of this group is to help patients create a crisis plan for use upon discharge or in the future, as needed.  Participation Level:  Active  Participation Quality:  Sharing  Affect:  Appropriate  Cognitive:  Oriented  Insight: Improving. Pt states thoughts seem to be more clear and organized.  Engagement in Group:  Engaged  Modes of Intervention:  Discussion and Support  Additional Comments:  Pt is concentrated on self care and how to continue it after discharge.   Kristalyn Bergstresser T 09/08/2012, 11:59 AM

## 2012-09-08 NOTE — Progress Notes (Signed)
[facility] LCSW Group Therapy       Emotional Regulation 1:15 - 2:30 PM            09/08/2012 3:35 PM  Type of Therapy:  Group Therapy  Participation Level:  Active  Participation Quality:  Appropriate  Affect:  Appropriate, Blunted, Depressed and Flat  Cognitive:  Alert and Appropriate  Insight:  Engaged  Engagement in Therapy:  Engaged  Modes of Intervention:  Confrontation, Discussion, Rapport Building and Support  Summary of Progress/Problems:  Patient talked about how he has held feelings in for years.  He stated he plans to take the filter off his mouth and let family know what he thinks.  Writer role played with patient and asked him to demonstrate how he would correct his children. Patient listened but only stated he would not talk until he felt respected by family member.  Patient was encouraged to to respond out of anger but to wait until some of his anger had settled before discussing concerns with family.  [name] 09/08/2012, 3:35 PM

## 2012-09-08 NOTE — Progress Notes (Signed)
Patient ID: Rodney Leach, male   DOB: 10-11-1960, 52 y.o.   MRN: [id number] D: Pt. Reports "racing thoughts, but not as consistent as they use to be". Pt. Still reports passive SI thoughts but contracts for safety.  Pt. Reports not sleeping well, spoke to doctor today will try Benadryl tonight. A: Pt. Will be monitored q59min for safety. Pt. Encouraged to attend group. Writer will review MAR, educate client and administer meds as ordered. R: Pt. Is safe on the unit. Pt. Attended group and participated.

## 2012-09-08 NOTE — Progress Notes (Signed)
D: Patient denies /HI and A/V hallucinations and reports on and off thoughts of SI; patient reports sleep is fair; reports appetite to be good ; reports energy level is low ; reports ability to pay attention is improving; rates depression as 7/10; rates hopelessness 7/10; rates anxiety as 9/10;   A: Monitored q 15 minutes; patient encouraged to attend groups; patient educated about medications; patient given medications per physician orders; patient encouraged to express feelings and/or concerns  R: Patient is flat but complaints of anxiety has decreased ; patient's interaction with staff and peers is appropriate; patient was able to set goal to talk with staff 1:1 when having feelings of SI; patient is taking medications as prescribed and tolerating medications; patient is attending all groups

## 2012-09-08 NOTE — Progress Notes (Signed)
Adult Psychoeducational Group Note  Date:  09/08/2012 Time:  9:18 PM  Group Topic/Focus:  Wrap-Up Group:   The focus of this group is to help patients review their daily goal of treatment and discuss progress on daily workbooks.  Participation Level:  Active  Participation Quality:  Appropriate  Affect:  Blunted  Cognitive:  Alert  Insight: Appropriate  Engagement in Group:  Engaged  Modes of Intervention:  Education  Additional Comments:  Pt stated that he has had a "day". Stated that he had to deal with some issues outside of the hospital and it took everything in him not to blow up and keep his composure. He also stated that he didn't think that Dr. Daleen Bo was being attentive to his needs.  Kaleen Odea R 09/08/2012, 9:18 PM

## 2012-09-08 NOTE — Clinical Social Work Note (Signed)
[facility] LCSW Aftercare Discharge Planning Group Note       8:30-9:30 AM  1/29/201412:51 PM  Participation Quality:  Appropriate  Affect:  Appropriate, Depressed, Flat  Cognitive:  Appropriate, Alert  Insight:  Engaged  Engagement in Group:  Engaged  Modes of Intervention:  Education, Exploration, Problem-solving and Support  Summary of Progress/Problems:  Patient continues to endorse off/on SI but contracts for safety. He shared that thoughts are less frequent but currently has plan with the thoughts.  He rates anxiety at ten and all other symptoms at seven.  [name] 09/08/2012 12:51 PM

## 2012-09-08 NOTE — Progress Notes (Signed)
Patient ID: Rodney Leach, male   DOB: May 07, 1961, 52 y.o.   MRN: [id number]  D: Patient pleasant and cooperative but has blunted affect endorsing depression. A: Monitor Q 15 minutes for safety, encourage staff/peer interaction and group participation. Administer medications as ordered by MD. R: Patient remains pleasant and cooperative with staff.

## 2012-09-08 NOTE — Progress Notes (Signed)
[facility] MD Progress Note  09/08/2012 12:55 PM Rodney Leach  MRN:  [id number] Subjective:  7/10 depression, 9/10 anxiety, "can't sleep still, mind keeps racing" Diagnosis:   Axis I: Anxiety Disorder NOS and Depressive Disorder NOS Axis II: Deferred Axis III:  Past Medical History  Diagnosis Date  . Stroke     "light" stroke  . Hypothyroidism   . Sleep apnea     CPAP  . Headache   . Arthritis   . Complication of anesthesia     has woken up during surgery before   . Hypertension   . Bipolar affective    Axis IV: economic problems, occupational problems, other psychosocial or environmental problems, problems related to social environment and problems with primary support group Axis V: 41-50 serious symptoms  ADL's:  Intact  Sleep: Poor  Appetite:  Good  Suicidal Ideation:  Plan:  None Intent:  None Means:  None Homicidal Ideation:  None  Psychiatric Specialty Exam: Review of Systems  Constitutional: Negative.   Eyes: Negative.   Respiratory: Negative.   Cardiovascular: Negative.   Gastrointestinal: Negative.   Genitourinary: Negative.   Musculoskeletal: Positive for back pain.       Knee pain  Skin: Negative.   Neurological: Negative.   Endo/Heme/Allergies: Negative.   Psychiatric/Behavioral: Positive for depression. The patient is nervous/anxious and has insomnia.     Blood pressure 120/78, pulse 75, temperature 98.7 F (37.1 C), temperature source Oral, resp. rate 16, height 6\' 1"  (1.854 m), weight 146.965 kg (324 lb).Body mass index is 42.75 kg/(m^2).  General Appearance: Casual  Eye Contact::  Fair  Speech:  Normal Rate  Volume:  Normal  Mood:  Anxious and Depressed  Affect:  Depressed  Thought Process:  Coherent  Orientation:  Full (Time, Place, and Person)  Thought Content:  WDL  Suicidal Thoughts:  Yes.  without intent/plan  Homicidal Thoughts:  No  Memory:  Immediate;   Fair Recent;   Fair Remote;   Fair  Judgement:  Fair  Insight:  Fair    Psychomotor Activity:  Normal  Concentration:  Fair  Recall:  Fair  Akathisia:  No  Handed:  Right  AIMS (if indicated):     Assets:  Communication Skills Intimacy Social Support  Sleep:  Number of Hours: 6.5    Current Medications: Current Facility-Administered Medications  Medication Dose Route Frequency Provider Last Rate Last Dose  . acetaminophen (TYLENOL) tablet 650 mg  650 mg Oral Q6H PRN Kerry Hough, PA      . albuterol (PROVENTIL HFA;VENTOLIN HFA) 108 (90 BASE) MCG/ACT inhaler 2 puff  2 puff Inhalation Q6H PRN Kerry Hough, PA      . alum & mag hydroxide-simeth (MAALOX/MYLANTA) 200-200-20 MG/5ML suspension 30 mL  30 mL Oral Q4H PRN Kerry Hough, PA      . aspirin chewable tablet 81 mg  81 mg Oral Daily Nanine Means, NP   81 mg at 09/08/12 0802  . atorvastatin (LIPITOR) tablet 10 mg  10 mg Oral Daily Kerry Hough, PA   10 mg at 09/07/12 1720  . diphenhydrAMINE (BENADRYL) capsule 50 mg  50 mg Oral Q6H PRN Nanine Means, NP      . FLUoxetine (PROZAC) capsule 20 mg  20 mg Oral Daily Nanine Means, NP   20 mg at 09/08/12 0802  . gabapentin (NEURONTIN) capsule 400 mg  400 mg Oral QID Kerry Hough, PA   400 mg at 09/08/12 1158  . hydrochlorothiazide (HYDRODIURIL)  tablet 25 mg  25 mg Oral Daily Nanine Means, NP   25 mg at 09/08/12 0803  . hydrOXYzine (ATARAX/VISTARIL) tablet 25 mg  25 mg Oral Q4H Nanine Means, NP   25 mg at 09/08/12 1027  . levothyroxine (SYNTHROID, LEVOTHROID) tablet 175 mcg  175 mcg Oral QAC breakfast Kerry Hough, PA   175 mcg at 09/08/12 1610  . lisinopril (PRINIVIL,ZESTRIL) tablet 20 mg  20 mg Oral BID Kerry Hough, PA   20 mg at 09/08/12 0802  . magnesium hydroxide (MILK OF MAGNESIA) suspension 30 mL  30 mL Oral Daily PRN Kerry Hough, PA      . mirtazapine (REMERON SOL-TAB) disintegrating tablet 30 mg  30 mg Oral QHS,MR X 1 Mickeal Skinner, MD   30 mg at 09/07/12 2157  . nicotine (NICODERM CQ - dosed in mg/24 hours) patch 21 mg  21 mg  Transdermal Q0600 Kerry Hough, PA   21 mg at 09/08/12 0608  . QUEtiapine (SEROQUEL XR) 24 hr tablet 200 mg  200 mg Oral QPC supper Himabindu Ravi, MD   200 mg at 09/07/12 1720    Lab Results: No results found for this or any previous visit (from the past 48 hour(s)).  Physical Findings: AIMS: Facial and Oral Movements Muscles of Facial Expression: None, normal Lips and Perioral Area: None, normal Jaw: None, normal Tongue: None, normal,Extremity Movements Upper (arms, wrists, hands, fingers): None, normal Lower (legs, knees, ankles, toes): None, normal, Trunk Movements Neck, shoulders, hips: None, normal, Overall Severity Severity of abnormal movements (highest score from questions above): None, normal Incapacitation due to abnormal movements: None, normal Patient's awareness of abnormal movements (rate only patient's report): No Awareness, Dental Status Current problems with teeth and/or dentures?: No Does patient usually wear dentures?: No  CIWA:  CIWA-Ar Total: 0  COWS:  COWS Total Score: 0   Treatment Plan Summary: Daily contact with patient to assess and evaluate symptoms and progress in treatment Medication management  Plan:  Review of chart, vital signs, medications, and notes. 1-Admit for crisis management and stabilization.  Estimated length of stay 1-3 days past his current stay of 7 2-Individual and group therapy encouraged 3-Medication management for depression and anxiety to reduce current symptoms to base line and improve the patient's overall level of functioning:  Medications reviewed with the patient and he stated no untoward effects, discontinued trazodone and started benadryl per MD 4-Coping skills for depression and anxiety developing-- 5-Continue crisis stabilization and management 6-Address health issues--monitoring blood pressures, stable 7-Treatment plan in progress to prevent relapse of depression and anxiety 8-Psychosocial education regarding relapse  prevention and self-care  Medical Decision Making Problem Points:  Established problem, stable/improving (1) and Review of psycho-social stressors (1) Data Points:  Review of new medications or change in dosage (2)  I certify that inpatient services furnished can reasonably be expected to improve the patient's condition.   Nanine Means, PMH-NP 09/08/2012, 12:55 PM

## 2012-09-09 MED ORDER — QUETIAPINE FUMARATE ER 300 MG PO TB24
300.0000 mg | ORAL_TABLET | Freq: Every day | ORAL | Status: DC
  Administered 2012-09-09 – 2012-09-10 (×2): 300 mg via ORAL
  Filled 2012-09-09 (×4): qty 1

## 2012-09-09 NOTE — Progress Notes (Signed)
Adult Psychoeducational Group Note  Date:  09/09/2012 Time:  6:58 PM  Group Topic/Focus:  Therapeutic Activity  Participation Level:  Active  Participation Quality:  Appropriate and Attentive  Affect:  Appropriate  Cognitive:  Appropriate  Insight: Appropriate  Engagement in Group:  Engaged  Modes of Intervention:  Activity and Socialization  Additional Comments:  Rodney Leach participated and attended the therapeutic activity. In the group patient was appropriate  and shared. Patient was asked to write down one thing he liked about himself and one thing he enjoyed doing, this activity focused on self esteem. Patient put his qualities into a basket with other peers and had to guess which qualities belonged to the peer in the group setting. After sharing the information about self esteem, patient did an activity on pictionary where a person would come up to the white board and pick a coping skills from the box and draw what the coping skill is while peers in group setting guess what the coping skill is.   Rodney Leach 09/09/2012, 6:58 PM

## 2012-09-09 NOTE — Progress Notes (Signed)
Patient ID: Rodney Leach, male   DOB: 05-05-1961, 52 y.o.   MRN: [id number] D: Pt. Visits with wife this evening, smile occasionally. Pt. Having passive SI, but no plans. A: Staff will monitor q48min for safety. Writer encouraged group. R: Pt. Is safe on the unit. Pt. Attended karaoke.

## 2012-09-09 NOTE — Progress Notes (Signed)
Adult Psychoeducational Group Note  Date:  09/09/2012 Time:  2000  Group Topic/Focus:  Karaoke  Participation Level:  Minimal  Participation Quality:  Attentive and Supportive  Affect:  Blunted and Flat  Cognitive:  Alert and Appropriate  Insight: Good  Engagement in Group:  Limited  Modes of Intervention:  Support  Additional Comments:    Humberto Seals Monique 09/09/2012, 10:42 PM

## 2012-09-09 NOTE — Progress Notes (Signed)
  D) Patient pleasant and cooperative upon my assessment. Patient verbalizes "I am so tired and I feel like I am not getting any sleep." Patient states "I was feeling a lot better yesterday than I do today." Patient completed Patient Self Inventory, reports slept "poor," and  appetite is "good." Patient rates depression as   7/10, patient rates hopeless feelings as  7/10. Patient endorses passive SI "off and on," contracts verbally for safety with RN. Patient denies HI, denies A/V hallucinations.   A) Patient offered support and encouragement, patient encouraged to discuss feelings/concerns with staff. Patient verbalized understanding. Patient monitored Q15 minutes for safety. Patient met with MD  to discuss today's goals and plan of care.  R) Patient visible in milieu, attending groups in day room and meals in dining room. Patient appropriate with staff and peers.   Patient taking medications as ordered. Patient has a plan to "have structure" at home after discharge.  Will continue to monitor.

## 2012-09-09 NOTE — Progress Notes (Signed)
Adult Psychoeducational Group Note  Date:  09/09/2012 Time:  12:01 PM  Group Topic/Focus:  Rediscovering Joy:   The focus of this group is to explore various ways to relieve stress in a positive manner.  Participation Level:  Active  Participation Quality:  Appropriate, Attentive, Sharing and Supportive  Affect:  Appropriate  Cognitive:  Alert, Appropriate and Oriented  Insight: Appropriate and Good  Engagement in Group:  Engaged and Supportive  Modes of Intervention:  Discussion, Education and Support  Additional Comments:  Patient sharing and supportive, appropriate during group discussion.   Noah Charon 09/09/2012, 12:01 PM

## 2012-09-09 NOTE — Progress Notes (Signed)
[facility] MD Progress Note  09/09/2012 10:03 AM Rodney Leach  MRN:  [id number] Subjective:  Patient complaining of increased racing thoughts, states it started getting worse since yesterday evening. Having trouble sleeping. Having suicidal thoughts.  Diagnosis:   Axis I: Bipolar, mixed Axis II: Deferred Axis III:  Past Medical History  Diagnosis Date  . Stroke     "light" stroke  . Hypothyroidism   . Sleep apnea     CPAP  . Headache   . Arthritis   . Complication of anesthesia     has woken up during surgery before   . Hypertension   . Bipolar affective    Axis IV: other psychosocial or environmental problems Axis V: 41-50 serious symptoms  ADL's:  Intact  Sleep: Poor  Appetite:  Fair   Psychiatric Specialty Exam: Review of Systems  Constitutional: Negative.   HENT: Negative.   Eyes: Negative.   Respiratory: Negative.   Cardiovascular: Negative.   Gastrointestinal: Negative.   Genitourinary: Negative.   Musculoskeletal: Negative.   Skin: Negative.   Neurological: Negative.   Endo/Heme/Allergies: Negative.   Psychiatric/Behavioral: Positive for depression and suicidal ideas. The patient is nervous/anxious and has insomnia.        Complaining of racing thoughts    Blood pressure 124/80, pulse 83, temperature 97.6 F (36.4 C), temperature source Oral, resp. rate 18, height 6\' 1"  (1.854 m), weight 146.965 kg (324 lb).Body mass index is 42.75 kg/(m^2).  General Appearance: Casual  Eye Contact::  Fair  Speech:  Clear and Coherent  Volume:  Decreased  Mood:  Anxious, Depressed and Dysphoric  Affect:  Constricted and Depressed  Thought Process:  Coherent  Orientation:  Full (Time, Place, and Person)  Thought Content:  WDL  Suicidal Thoughts:  Yes.  without intent/plan  Homicidal Thoughts:  No  Memory:  Immediate;   Fair Recent;   Fair Remote;   Fair  Judgement:  Fair  Insight:  Fair  Psychomotor Activity:  Normal  Concentration:  Fair  Recall:  Fair   Akathisia:  No  Handed:  Right  AIMS (if indicated):     Assets:  Communication Skills Desire for Improvement Housing Social Support  Sleep:  Number of Hours: 6.5    Current Medications: Current Facility-Administered Medications  Medication Dose Route Frequency Provider Last Rate Last Dose  . acetaminophen (TYLENOL) tablet 650 mg  650 mg Oral Q6H PRN Kerry Hough, PA      . albuterol (PROVENTIL HFA;VENTOLIN HFA) 108 (90 BASE) MCG/ACT inhaler 2 puff  2 puff Inhalation Q6H PRN Kerry Hough, PA      . alum & mag hydroxide-simeth (MAALOX/MYLANTA) 200-200-20 MG/5ML suspension 30 mL  30 mL Oral Q4H PRN Kerry Hough, PA      . aspirin chewable tablet 81 mg  81 mg Oral Daily Nanine Means, NP   81 mg at 09/09/12 0745  . atorvastatin (LIPITOR) tablet 10 mg  10 mg Oral Daily Kerry Hough, PA   10 mg at 09/08/12 1714  . diphenhydrAMINE (BENADRYL) capsule 50 mg  50 mg Oral Q6H PRN Nanine Means, NP   50 mg at 09/08/12 2137  . FLUoxetine (PROZAC) capsule 20 mg  20 mg Oral Daily Nanine Means, NP   20 mg at 09/09/12 0745  . gabapentin (NEURONTIN) capsule 400 mg  400 mg Oral QID Kerry Hough, PA   400 mg at 09/09/12 0746  . hydrochlorothiazide (HYDRODIURIL) tablet 25 mg  25 mg Oral Daily Catha Nottingham  Lord, NP   25 mg at 09/09/12 0745  . hydrOXYzine (ATARAX/VISTARIL) tablet 25 mg  25 mg Oral Q4H Nanine Means, NP   25 mg at 09/09/12 0954  . levothyroxine (SYNTHROID, LEVOTHROID) tablet 175 mcg  175 mcg Oral QAC breakfast Kerry Hough, PA   175 mcg at 09/09/12 1610  . lisinopril (PRINIVIL,ZESTRIL) tablet 20 mg  20 mg Oral BID Kerry Hough, PA   20 mg at 09/09/12 0745  . magnesium hydroxide (MILK OF MAGNESIA) suspension 30 mL  30 mL Oral Daily PRN Kerry Hough, PA      . mirtazapine (REMERON SOL-TAB) disintegrating tablet 30 mg  30 mg Oral QHS,MR X 1 Mickeal Skinner, MD   30 mg at 09/08/12 2225  . nicotine (NICODERM CQ - dosed in mg/24 hours) patch 21 mg  21 mg Transdermal Q0600 Kerry Hough, PA   21 mg at 09/09/12 9604  . QUEtiapine (SEROQUEL XR) 24 hr tablet 300 mg  300 mg Oral QPC supper Qaadir Kent, MD        Lab Results: No results found for this or any previous visit (from the past 48 hour(s)).  Physical Findings: AIMS: Facial and Oral Movements Muscles of Facial Expression: None, normal Lips and Perioral Area: None, normal Jaw: None, normal Tongue: None, normal,Extremity Movements Upper (arms, wrists, hands, fingers): None, normal Lower (legs, knees, ankles, toes): None, normal, Trunk Movements Neck, shoulders, hips: None, normal, Overall Severity Severity of abnormal movements (highest score from questions above): None, normal Incapacitation due to abnormal movements: None, normal Patient's awareness of abnormal movements (rate only patient's report): No Awareness, Dental Status Current problems with teeth and/or dentures?: No Does patient usually wear dentures?: No  CIWA:  CIWA-Ar Total: 0  COWS:  COWS Total Score: 0   Treatment Plan Summary: Daily contact with patient to assess and evaluate symptoms and progress in treatment Medication management  Plan: Increase Seroquel to 300mg  po qd. Discussed the interactions between Prozac and Seroquel and how prozac can cause racing thoughts as well. Continue to monitor mood symptoms.  Medical Decision Making Problem Points:  Established problem, worsening (2), Review of last therapy session (1) and Review of psycho-social stressors (1) Data Points:  Review of medication regiment & side effects (2) Review of new medications or change in dosage (2)  I certify that inpatient services furnished can reasonably be expected to improve the patient's condition.   [name] 09/09/2012, 10:03 AM

## 2012-09-09 NOTE — Progress Notes (Signed)
[facility] LCSW Group Therapy     [facility]  1:15 - 2:30 PM            09/09/2012 3:36 PM  Type of Therapy:  Group Therapy  Participation Level: Limited  Participation Quality:  Limited  Affect:  Appropriate, Blunted, Depressed and Flat  Cognitive:  Alert and Appropriate  Insight:  Engaged  Engagement in Therapy:  Engaged  Modes of Intervention:  Confrontation, Discussion, Rapport Building and Support  Summary of Progress/Problems:  Patient listened attentively to speaker from [facility] [employer] [facility] but made no comments on the presentation [name] 09/09/2012, 3:36 PM

## 2012-09-09 NOTE — Progress Notes (Signed)
[facility] LCSW Aftercare Discharge Planning Group Note  09/09/2012 11:18 AM  Participation Quality:  Appropriate and Attentive  Affect:  Angry, Blunted, Depressed, Flat and Irritable  Cognitive:  Alert and Appropriate  Insight:  Engaged  Engagement in Group:  Engaged  Modes of Intervention:  Education, Exploration, Problem-solving, Rapport Building and Support  Summary of Progress/Problems:  Patient continues to endorse SI but reports less frequent occurrences.  He is able to contract for safety.  Patient reports anxiety at ten and rates other symptoms at seven. He states is has racing and scrambled thoughts.  He also endorses being irritable and agitated today.  [name] 09/09/2012, 11:18 AM

## 2012-09-10 DIAGNOSIS — F339 Major depressive disorder, recurrent, unspecified: Secondary | ICD-10-CM

## 2012-09-10 NOTE — Progress Notes (Signed)
[facility] MD Progress Note  09/10/2012 3:36 PM Rodney Leach  MRN:  [id number] Subjective:  Complains of increased depression/anxiety/and restlessness with agitation--Prozac discontinued per MD Diagnosis:   Axis I: Anxiety Disorder NOS and Depressive Disorder NOS Axis II: Deferred Axis III:  Past Medical History  Diagnosis Date  . Stroke     "light" stroke  . Hypothyroidism   . Sleep apnea     CPAP  . Headache   . Arthritis   . Complication of anesthesia     has woken up during surgery before   . Hypertension   . Bipolar affective    Axis IV: economic problems, occupational problems, other psychosocial or environmental problems, problems related to social environment and problems with primary support group Axis V: 41-50 serious symptoms  ADL's:  Intact  Sleep: Fair  Appetite:  Fair  Suicidal Ideation:  Plan:  None Intent:  None Means:  None Homicidal Ideation:  Denies  Psychiatric Specialty Exam: Review of Systems  Constitutional: Negative.   HENT: Negative.   Eyes: Negative.   Respiratory: Negative.   Cardiovascular: Negative.   Gastrointestinal: Negative.   Genitourinary: Negative.   Musculoskeletal: Negative.   Skin: Negative.   Neurological: Negative.   Endo/Heme/Allergies: Negative.   Psychiatric/Behavioral: Positive for depression and suicidal ideas. The patient is nervous/anxious.     Blood pressure 131/85, pulse 76, temperature 97.7 F (36.5 C), temperature source Oral, resp. rate 16, height 6\' 1"  (1.854 m), weight 146.965 kg (324 lb).Body mass index is 42.75 kg/(m^2).  General Appearance: Casual  Eye Contact::  Fair  Speech:  Normal Rate  Volume:  Normal  Mood:  Anxious and Depressed  Affect:  Depressed  Thought Process:  Coherent  Orientation:  Full (Time, Place, and Person)  Thought Content:  WDL  Suicidal Thoughts:  Yes with no intent  Homicidal Thoughts:  No  Memory:  Immediate;   Fair Recent;   Fair Remote;   Fair  Judgement:  Fair   Insight:  Fair  Psychomotor Activity:  Normal  Concentration:  Fair  Recall:  Fair  Akathisia:  No  Handed:  Right  AIMS (if indicated):     Assets:  Communication Skills Desire for Improvement Resilience  Sleep:  Number of Hours: 6.25    Current Medications: Current Facility-Administered Medications  Medication Dose Route Frequency Provider Last Rate Last Dose  . acetaminophen (TYLENOL) tablet 650 mg  650 mg Oral Q6H PRN Kerry Hough, PA      . albuterol (PROVENTIL HFA;VENTOLIN HFA) 108 (90 BASE) MCG/ACT inhaler 2 puff  2 puff Inhalation Q6H PRN Kerry Hough, PA      . alum & mag hydroxide-simeth (MAALOX/MYLANTA) 200-200-20 MG/5ML suspension 30 mL  30 mL Oral Q4H PRN Kerry Hough, PA      . aspirin chewable tablet 81 mg  81 mg Oral Daily Nanine Means, NP   81 mg at 09/10/12 0732  . atorvastatin (LIPITOR) tablet 10 mg  10 mg Oral Daily Kerry Hough, PA   10 mg at 09/09/12 1708  . diphenhydrAMINE (BENADRYL) capsule 50 mg  50 mg Oral Q6H PRN Nanine Means, NP   50 mg at 09/09/12 2154  . gabapentin (NEURONTIN) capsule 400 mg  400 mg Oral QID Kerry Hough, PA   400 mg at 09/10/12 1100  . hydrochlorothiazide (HYDRODIURIL) tablet 25 mg  25 mg Oral Daily Nanine Means, NP   25 mg at 09/10/12 0733  . hydrOXYzine (ATARAX/VISTARIL) tablet 25 mg  25 mg Oral Q4H Nanine Means, NP   25 mg at 09/10/12 1439  . levothyroxine (SYNTHROID, LEVOTHROID) tablet 175 mcg  175 mcg Oral QAC breakfast Kerry Hough, PA   175 mcg at 09/10/12 1610  . lisinopril (PRINIVIL,ZESTRIL) tablet 20 mg  20 mg Oral BID Kerry Hough, PA   20 mg at 09/10/12 9604  . magnesium hydroxide (MILK OF MAGNESIA) suspension 30 mL  30 mL Oral Daily PRN Kerry Hough, PA      . mirtazapine (REMERON SOL-TAB) disintegrating tablet 30 mg  30 mg Oral QHS,MR X 1 Mickeal Skinner, MD   30 mg at 09/09/12 2153  . nicotine (NICODERM CQ - dosed in mg/24 hours) patch 21 mg  21 mg Transdermal Q0600 Kerry Hough, PA   21 mg at  09/10/12 5409  . QUEtiapine (SEROQUEL XR) 24 hr tablet 300 mg  300 mg Oral QPC supper Himabindu Ravi, MD   300 mg at 09/09/12 1708    Lab Results: No results found for this or any previous visit (from the past 48 hour(s)).  Physical Findings: AIMS: Facial and Oral Movements Muscles of Facial Expression: None, normal Lips and Perioral Area: None, normal Jaw: None, normal Tongue: None, normal,Extremity Movements Upper (arms, wrists, hands, fingers): None, normal Lower (legs, knees, ankles, toes): None, normal, Trunk Movements Neck, shoulders, hips: None, normal, Overall Severity Severity of abnormal movements (highest score from questions above): None, normal Incapacitation due to abnormal movements: None, normal Patient's awareness of abnormal movements (rate only patient's report): No Awareness, Dental Status Current problems with teeth and/or dentures?: No Does patient usually wear dentures?: No  CIWA:  CIWA-Ar Total: 0  COWS:  COWS Total Score: 0   Treatment Plan Summary: Daily contact with patient to assess and evaluate symptoms and progress in treatment Medication management  Plan:  Review of chart, vital signs, medications, and notes. 1-Admit for crisis management and stabilization.  Estimated length of stay 1-3 days past his current stay of 9 2-Individual and group therapy encouraged 3-Medication management for depression and anxiety to reduce current symptoms to base line and improve the patient's overall level of functioning:  Prozac discontinued per MD based on his increased agitation, will continue to monitor 4-Coping skills for depression and anxiety developing-- 5-Continue crisis stabilization and management 6-Address health issues--monitoring blood pressures, stable 7-Treatment plan in progress to prevent relapse of depression and anxiety 8-Psychosocial education regarding relapse prevention and self-care  Medical Decision Making Problem Points:  Established  problem, stable/improving (1) and Review of psycho-social stressors (1) Data Points:  Review of new medications or change in dosage (2) Review or order of Psychological tests (1)  I certify that inpatient services furnished can reasonably be expected to improve the patient's condition.   Nanine Means, PMH-NP 09/10/2012, 3:36 PM

## 2012-09-10 NOTE — Progress Notes (Signed)
[facility] Group Notes:  (Nursing/MHT/Case Management/Adjunct)  Date:  09/10/2012  Time:  11:25 AM  Type of Therapy:  Therapuetic Activity   Participation Level:  Active  Participation Quality:  Appropriate and Attentive  Affect:  Appropriate  Cognitive:  Appropriate  Insight:  Appropriate  Engagement in Group:  Engaged  Modes of Intervention:  Activity  Summary of Progress/Problems: Pt. Participated in group activity of Human Bingo in which peers socialize and get to know each other. Afterwards Pt. Participated in group discussion.    Ruta Hinds [facility] 09/10/2012, 11:25 AM

## 2012-09-10 NOTE — Progress Notes (Signed)
  D) Patient pleasant and cooperative upon my assessment. Patient completed Patient Self Inventory, reports slept "fair," and  appetite is "good." Patient rates depression as   9/10, patient rates hopeless feelings as  9/10. Patient states "I slept great last night, better than I have in a long time." Patient endorses "off and on" SI, contracts verbally for safety with RN. Patient denies HI, denies A/V hallucinations.   A) Patient offered support and encouragement, patient encouraged to discuss feelings/concerns with staff. Patient verbalized understanding. Patient monitored Q15 minutes for safety. Patient met with MD  to discuss today's goals and plan of care.  R) Patient visible in milieu, attending groups in day room and meals in dining room. Patient appropriate with staff and peers.   Patient insightful, states I need to "learn to ask for help." Patient taking medications as ordered. Will continue to monitor.

## 2012-09-10 NOTE — Tx Team (Signed)
Interdisciplinary Treatment Plan Update (Adult)  Date:  09/10/2012  Time Reviewed:  9:55 AM   Progress in Treatment: Attending groups:   Yes   Participating in groups:  Yes Taking medication as prescribed:  Yes Tolerating medication:  Yes Family/Significant othe contact made: Contact made with family Patient understands diagnosis:  Yes Discussing patient identified problems/goals with staff: Yes Medical problems stabilized or resolved: Yes Denies suicidal/homicidal ideation: Patient continues to endorse SI but contracts for safety Issues/concerns per patient self-inventory:  Other:   New problem(s) identified:  Reason for Continuation of Hospitalization: Anxiety Depression Medication stabilization Suicidal Ideation  Interventions implemented related to continuation of hospitalization:  Medication Management; safety checks q 15 mins  Additional comments:  Estimated length of stay:  2-3 days  Discharge Plan:  Home with outpatient follow up  New goal(s):  Review of initial/current patient goals per problem list:    1.  Goal(s): Eliminate SI/other thoughts of self harm (Patient will no longer endorse SI/other thought self harm)    Met:  Yes  Target date: d/c  As evidenced by: Patient contacts to endorse SI but contracts for safety    2.  Goal (s):Reduce depression/anxiety (Patient will rate symptoms at four or below)  Met: Yes  Target date: d/c  As evidenced by: Patient rates symptoms at eight today    3.  Goal(s):.stabilize on meds (patient will report being stabilized on meds)    Met:  Yes  Target date: d/c  As evidenced by: Patient continues to endorse symptoms   4.  Goal(s): Refer for outpatient follow up (follow up appointment will be scheduled)   Met:  Yes  Target date: d/c  As evidenced by: Follow up appointment is not scheduled     Attendees: Patient:   09/10/2012 9:55 AM  Physican:  Patrick North, MD 09/10/2012 9:55 AM  Nursing:   Nestor Ramp, RN 09/10/2012 9:55 AM   Nursing:   Berneice Heinrich, RN 09/10/2012 9:55 AM   Clinical Social Worker:  Juline Patch, LCSW 09/10/2012 9:55 AM   Other: Tera Helper, PHM-NP 09/10/2012 9:55 AM   Other:   09/10/2012 9:55 AM Other:        09/10/2012 9:55 AM

## 2012-09-10 NOTE — Progress Notes (Signed)
[facility] LCSW Group Therapy      Feelings Around Relapse 1:15 - 2:30 PM            09/10/2012 3:19 PM  Type of Therapy:  Group Therapy  Participation Level: Limited  Participation Quality:  Limited  Affect:  Appropriate, Blunted, Depressed and Flat  Cognitive:  Alert and Appropriate  Insight:  Engaged  Engagement in Therapy:  Engaged  Modes of Intervention:  Confrontation, Discussion, Rapport Building and Support  Summary of Progress/Problems: Patient shared that in order for him not to relapse he needs to structure his day, be mindful of his illness, and live one day at a time.    [name] 09/10/2012, 3:19 PM

## 2012-09-10 NOTE — Progress Notes (Signed)
[facility] INPATIENT:  Family/Significant Other Suicide Prevention Education  Suicide Prevention Education: Late Entry for 09/08/12 Education Completed; [employer], Wife, [phone]) has been identified by the patient as the family member/significant other with whom the patient will be residing, and identified as the person(s) who will aid the patient in the event of a mental health crisis (suicidal ideations/suicide attempt).  With written consent from the patient, the family member/significant other has been provided the following suicide prevention education, prior to the and/or following the discharge of the patient.  The suicide prevention education provided includes the following:  Suicide risk factors  Suicide prevention and interventions  National Suicide Hotline telephone number  [facility] assessment telephone number  [facility] Emergency Assistance 911  [facility] and/or Residential Mobile Crisis Unit telephone number  Request made of family/significant other to:  Remove weapons (e.g., guns, rifles, knives), all items previously/currently identified as safety concern.  Wife advised there are no guns in the home.  Remove drugs/medications (over-the-counter, prescriptions, illicit drugs), all items previously/currently identified as a safety concern.  The family member/significant other verbalizes understanding of the suicide prevention education information provided.  The family member/significant other agrees to remove the items of safety concern listed above.  Rodney Leach 09/10/2012, 12:51 PM

## 2012-09-10 NOTE — Progress Notes (Signed)
Reviewed.  Patient seen.

## 2012-09-10 NOTE — Progress Notes (Signed)
Writer spoke with patient 1:1 and inquired about how his day has been and patient reports that his day has been horrible. Patient c/o feeling that his doctor is not listening to his concerns about his medications not working and doesn't spend enough time with him to know whats going on with him, he reports feeling very anxious all day. Patient was offered his visteril early d/t him feeling anxious. Patient continues to report si constant but verbally contracts for safety. Patient deneis hi/a/v hallucinations. Patient offered support and encouragement, safety maintained on unit, will continue to monitor.

## 2012-09-10 NOTE — Progress Notes (Signed)
[facility] LCSW Aftercare Discharge Planning Group Note  09/10/2012 12:49 PM  Participation Quality:  Appropriate and Attentive  Affect:  Angry, Blunted, Depressed, Flat and Irritable  Cognitive:  Alert and Appropriate  Insight:  Engaged  Engagement in Group:  Engaged  Modes of Intervention:  Education, Exploration, Problem-solving, Rapport Building and Support  Summary of Progress/Problems:  Patient continues to endorse SI but more frequent thoughts of SI with plan.  Patient able to contract for safety.  Patient continues to endorse racing thoughts.  Patient shared he does not believe he is close to discharging home.  [name] 09/10/2012, 12:49 PM

## 2012-09-11 MED ORDER — BUSPIRONE HCL 15 MG PO TABS
7.5000 mg | ORAL_TABLET | Freq: Three times a day (TID) | ORAL | Status: DC
  Administered 2012-09-11 – 2012-09-16 (×16): 7.5 mg via ORAL
  Filled 2012-09-11 (×18): qty 1

## 2012-09-11 MED ORDER — DIVALPROEX SODIUM ER 500 MG PO TB24
500.0000 mg | ORAL_TABLET | Freq: Once | ORAL | Status: AC
  Administered 2012-09-11: 500 mg via ORAL
  Filled 2012-09-11: qty 1

## 2012-09-11 MED ORDER — DIVALPROEX SODIUM ER 500 MG PO TB24
1000.0000 mg | ORAL_TABLET | Freq: Every day | ORAL | Status: DC
  Administered 2012-09-11 – 2012-09-12 (×2): 1000 mg via ORAL
  Filled 2012-09-11 (×3): qty 2

## 2012-09-11 MED ORDER — QUETIAPINE FUMARATE ER 400 MG PO TB24
400.0000 mg | ORAL_TABLET | Freq: Every day | ORAL | Status: DC
  Administered 2012-09-11 – 2012-09-14 (×4): 400 mg via ORAL
  Filled 2012-09-11 (×7): qty 1

## 2012-09-11 NOTE — Progress Notes (Signed)
[facility] Group Notes:  (Nursing/MHT/Case Management/Adjunct)  Date:  09/11/2012  Time:  1:58 AM  Type of Therapy:  Psychoeducational Skills  Participation Level:  Minimal  Participation Quality:  Inattentive  Affect:  Depressed  Cognitive:  Appropriate  Insight:  Improving  Engagement in Group:  Lacking  Modes of Intervention:  Education  Summary of Progress/Problems: The patient verbalized that he had a rough day. He did state however, that he was able to lay down in his room and was able to think "good thoughts". His goal for tomorrow is to speak with his doctor about prescribing new medication for him.   Daniesha Driver S 09/11/2012, 1:58 AM

## 2012-09-11 NOTE — Progress Notes (Signed)
Adult Psychoeducational Group Note  Date:  09/11/2012 Time:  0945  Group Topic/Focus:  Coping With Mental Health Crisis:   The purpose of this group is to help patients identify strategies for coping with mental health crisis.  Group discusses possible causes of crisis and ways to manage them effectively. Self inventory review  Participation Level:  Active  Participation Quality:  Redirectable, Resistant and Sharing  Affect:  Anxious, Blunted, Defensive and Irritable  Cognitive:  Appropriate  Insight: Improving  Engagement in Group:  Defensive, Developing/Improving and Resistant  Modes of Intervention:  Clarification, Discussion, Education, Problem-solving and Support  Additional Comments:  Pt is participating appropriately, but makes excuses to avoid responsibility. Pt primary complaint is lack of sleep.   Rodney Leach [name] 09/11/2012, 4:04 PM

## 2012-09-11 NOTE — Progress Notes (Signed)
Patient ID: Rodney Leach, male   DOB: 1961/07/18, 52 y.o.   MRN: [id number] [facility] MD Progress Note  09/11/2012 4:12 PM Rodney Leach  MRN:  [id number] Subjective:  Describes cycle that starts September and ends in January usually. Racing thoughts and flashbacks to wife miscarrying and baby hitting its head on the floor. Has cycled like this for 15 years but the miscarriage was 2  Years ago. Says Thursday his SI returned and he woke up from sleep last night with suicidal thoughts and plans.  Can't take Lithium but is agreeable to starting Depakote. Will also start BuSpar for anxiety.  Diagnosis:   Axis I: Anxiety Disorder NOS and Depressive Disorder NOS Axis II: Deferred Axis III:  Past Medical History  Diagnosis Date  . Stroke     "light" stroke  . Hypothyroidism   . Sleep apnea     CPAP  . Headache   . Arthritis   . Complication of anesthesia     has woken up during surgery before   . Hypertension   . Bipolar affective    Axis IV: economic problems, occupational problems, other psychosocial or environmental problems, problems related to social environment and problems with primary support group Axis V: 41-50 serious symptoms  ADL's:  Intact  Sleep: Fair  Appetite:  Fair  Suicidal Ideation:  Plan:  None Intent:  None Means:  None Homicidal Ideation:  Denies  Psychiatric Specialty Exam: Review of Systems  Constitutional: Negative.   HENT: Negative.   Eyes: Negative.   Respiratory: Negative.   Cardiovascular: Negative.   Gastrointestinal: Negative.   Genitourinary: Negative.   Musculoskeletal: Negative.   Skin: Negative.   Neurological: Negative.   Endo/Heme/Allergies: Negative.   Psychiatric/Behavioral: Positive for depression and suicidal ideas. The patient is nervous/anxious.     Blood pressure 103/68, pulse 83, temperature 98.3 F (36.8 C), temperature source Oral, resp. rate 16, height 6\' 1"  (1.854 m), weight 146.965 kg (324 lb).Body mass index is 42.75  kg/(m^2).  General Appearance: Casual  Eye Contact::  Fair  Speech:  Normal Rate  Volume:  Normal  Mood:  Anxious and Depressed  Affect:  Depressed  Thought Process:  Coherent  Orientation:  Full (Time, Place, and Person)  Thought Content:  WDL  Suicidal Thoughts:  Yes with no intent  Homicidal Thoughts:  No  Memory:  Immediate;   Fair Recent;   Fair Remote;   Fair  Judgement:  Fair  Insight:  Fair  Psychomotor Activity:  Normal  Concentration:  Fair  Recall:  Fair  Akathisia:  No  Handed:  Right  AIMS (if indicated):     Assets:  Communication Skills Desire for Improvement Resilience  Sleep:  Number of Hours: 6.5    Current Medications: Current Facility-Administered Medications  Medication Dose Route Frequency Provider Last Rate Last Dose  . acetaminophen (TYLENOL) tablet 650 mg  650 mg Oral Q6H PRN Kerry Hough, PA      . albuterol (PROVENTIL HFA;VENTOLIN HFA) 108 (90 BASE) MCG/ACT inhaler 2 puff  2 puff Inhalation Q6H PRN Kerry Hough, PA      . alum & mag hydroxide-simeth (MAALOX/MYLANTA) 200-200-20 MG/5ML suspension 30 mL  30 mL Oral Q4H PRN Kerry Hough, PA      . aspirin chewable tablet 81 mg  81 mg Oral Daily Nanine Means, NP   81 mg at 09/11/12 7829  . atorvastatin (LIPITOR) tablet 10 mg  10 mg Oral Daily Kerry Hough, PA  10 mg at 09/10/12 1806  . busPIRone (BUSPAR) tablet 7.5 mg  7.5 mg Oral TID Fredrik Cove, PA-C   7.5 mg at 09/11/12 1400  . diphenhydrAMINE (BENADRYL) capsule 50 mg  50 mg Oral Q6H PRN Nanine Means, NP   50 mg at 09/10/12 2157  . divalproex (DEPAKOTE ER) 24 hr tablet 1,000 mg  1,000 mg Oral QHS Fredrik Cove, PA-C      . gabapentin (NEURONTIN) capsule 400 mg  400 mg Oral QID Kerry Hough, PA   400 mg at 09/11/12 1109  . hydrochlorothiazide (HYDRODIURIL) tablet 25 mg  25 mg Oral Daily Nanine Means, NP   25 mg at 09/11/12 1610  . hydrOXYzine (ATARAX/VISTARIL) tablet 25 mg  25 mg Oral Q4H Nanine Means, NP   25 mg at 09/11/12  1435  . levothyroxine (SYNTHROID, LEVOTHROID) tablet 175 mcg  175 mcg Oral QAC breakfast Kerry Hough, PA   175 mcg at 09/11/12 9604  . lisinopril (PRINIVIL,ZESTRIL) tablet 20 mg  20 mg Oral BID Kerry Hough, PA   20 mg at 09/11/12 5409  . magnesium hydroxide (MILK OF MAGNESIA) suspension 30 mL  30 mL Oral Daily PRN Kerry Hough, PA      . mirtazapine (REMERON SOL-TAB) disintegrating tablet 30 mg  30 mg Oral QHS,MR X 1 Mickeal Skinner, MD   30 mg at 09/10/12 2331  . nicotine (NICODERM CQ - dosed in mg/24 hours) patch 21 mg  21 mg Transdermal Q0600 Kerry Hough, PA   21 mg at 09/11/12 8119  . QUEtiapine (SEROQUEL XR) 24 hr tablet 300 mg  300 mg Oral QPC supper Himabindu Ravi, MD   300 mg at 09/10/12 1807    Lab Results: No results found for this or any previous visit (from the past 48 hour(s)).  Physical Findings: AIMS: Facial and Oral Movements Muscles of Facial Expression: None, normal Lips and Perioral Area: None, normal Jaw: None, normal Tongue: None, normal,Extremity Movements Upper (arms, wrists, hands, fingers): None, normal Lower (legs, knees, ankles, toes): None, normal, Trunk Movements Neck, shoulders, hips: None, normal, Overall Severity Severity of abnormal movements (highest score from questions above): None, normal Incapacitation due to abnormal movements: None, normal Patient's awareness of abnormal movements (rate only patient's report): No Awareness, Dental Status Current problems with teeth and/or dentures?: No Does patient usually wear dentures?: No  CIWA:  CIWA-Ar Total: 0  COWS:  COWS Total Score: 0   Treatment Plan Summary: Daily contact with patient to assess and evaluate symptoms and progress in treatment Medication management  Plan:  Review of chart, vital signs, medications, and notes. 1-Admit for crisis management and stabilization.  Estimated length of stay 1-3 days past his current stay of 10 2-Individual and group therapy  encouraged 3-Medication management for depression and anxiety to reduce current symptoms to base line and improve the patient's overall level of functioning:  Prozac discontinued per MD based on his increased agitation, will continue to monitor 4-Coping skills for depression and anxiety developing-- 5-Continue crisis stabilization and management 6-Address health issues--monitoring blood pressures, stable 7-Treatment plan in progress to prevent relapse of depression and anxiety 8-Psychosocial education regarding relapse prevention and self-care  Medical Decision Making Problem Points:  Established problem, stable/improving (1) and Review of psycho-social stressors (1) Data Points:  Review of new medications or change in dosage (2) Review or order of Psychological tests (1)  Reviewed note and discussed with the provider and agree with the above plan. I certify  that inpatient services furnished can reasonably be expected to improve the patient's condition.   ADAMS,MICKIE D.RPA-C CAQ-Psych  09/11/2012, 4:12 PM

## 2012-09-11 NOTE — Clinical Social Work Psychosocial (Signed)
[facility] Group Notes:  (Clinical Social Work)  09/11/2012     3-4pm  Summary of Progress/Problems:   The main focus of today's process group was for the patient to identify ways in which they have in the past sabotaged their own recovery. Cognitive Behavioral Therapy concepts about the interconnectedness of thoughts/feelings/actions were introduced, and there was practice time for the techniques of Thought Stopping and Thought Replacement. The patient expressed that these techniques can be helpful to him to prevent deterioration as he first starts to cycle downward.  He remains glad that he noticed the downward spiral this time, and that he sought help before it got as bad as it has in the past.  He was able to identify the difference in his thinking when well versus his thinking when sick.  He also talked about how much harder it is to recuperate this time, as he is getting older.  Type of Therapy:  Group Therapy - Process   Participation Level:  Active  Participation Quality:  Attentive and Sharing  Affect:  Blunted and Depressed  Cognitive:  Oriented  Insight:  Engaged  Engagement in Therapy:  Engaged  Modes of Intervention:  Education, Teacher, English as a foreign language, Exploration, Discussion, CBT   Ambrose Mantle, LCSW 09/11/2012, 4:28 PM

## 2012-09-11 NOTE — Progress Notes (Signed)
D: Pt in bed resting with eyes closed. Respirations even and unlabored. Pt appears to be in no signs of distress at this time. CPAP in use for pt.  A: Q73min checks remains for this pt. R: Pt remains safe at this time.

## 2012-09-12 DIAGNOSIS — F329 Major depressive disorder, single episode, unspecified: Secondary | ICD-10-CM

## 2012-09-12 DIAGNOSIS — F411 Generalized anxiety disorder: Secondary | ICD-10-CM

## 2012-09-12 NOTE — Progress Notes (Signed)
Patient ID: Rodney Leach, male   DOB: Oct 06, 1960, 52 y.o.   MRN: [id number] D: Pt is awake and active on the unit this AM. Pt does endorse suicidal ideations but he is able to contract for safety. Pt is participating in the milieu and is cooperative with staff. Pt rates their depression at and hopelessness at . Pt mood is depressed and his affect is anxious. Pt states that he did sleep much better than on previous nights and that his medications helped him tremendously. He still c/o racing thoughts but says that he feels much better.  A: Writer utilized therapeutic communication, encouraged pt to discuss feelings with staff and administered medication per MD orders. Writer also encouraged pt to attend groups. Pt misses his friends that he recently lost and is struggling to find the good ion things or to be optimistic. Writer encouraged positive thinking and problem solving based on new coping skills.   R: Pt is attending groups and tolerating medications well. Writer will continue to monitor. 15 minute checks are ongoing for safety.

## 2012-09-12 NOTE — Progress Notes (Addendum)
Writer spoke with patient at medication window. Patient received his scheduled 2000 medication and reports he is pleased that his medications have been adjusted and some added and is hopeful that they will work. Patient has been up in the dayroom interacting appropriately with select peers. Patient reports passive si and verbally contracts with Press photographer. Patient denies hi/a/v hallucinations. Support and encouragement offered and safety maintained on unit, will continue to monitor.

## 2012-09-12 NOTE — Progress Notes (Signed)
Adult Psychoeducational Group Note  Date:  09/12/2012 Time:  0900  Group Topic/Focus:  Self Inventory Review   Participation Level:  Did Not Attend pt was asleep in bed. Pt has been sleep deprived for several days.   Rodney Leach [name] 09/12/2012, 9:45 AM

## 2012-09-12 NOTE — Progress Notes (Signed)
Patient ID: Rodney Leach, male   DOB: [birth year], 52 y.o.   MRN: [id number] [facility] MD Progress Note  09/12/2012 9:57 AM [facility]  MRN:  [id number] Subjective:  Seen in room in bed. Finally got to sleep around 2 am. Not sure if he has noticed an effect from the Depakote yet. Didn't awaken with suicidal thoughts/plan last night. No med changes/adjustments today.  Diagnosis:   Axis I: Anxiety Disorder NOS and Depressive Disorder NOS Axis II: Deferred Axis III:  Past Medical History  Diagnosis Date  . Stroke     "light" stroke  . Hypothyroidism   . Sleep apnea     CPAP  . Headache   . Arthritis   . Complication of anesthesia     has woken up during surgery before   . Hypertension   . Bipolar affective    Axis IV: economic problems, occupational problems, other psychosocial or environmental problems, problems related to social environment and problems with primary support group Axis V: 41-50 serious symptoms  ADL's:  Intact  Sleep: Fair  Appetite:  Fair  Suicidal Ideation:  Plan:  None Intent:  None Means:  None Homicidal Ideation:  Denies  Psychiatric Specialty Exam: Review of Systems  Constitutional: Negative.   HENT: Negative.   Eyes: Negative.   Respiratory: Negative.   Cardiovascular: Negative.   Gastrointestinal: Negative.   Genitourinary: Negative.   Musculoskeletal: Negative.   Skin: Negative.   Neurological: Negative.   Endo/Heme/Allergies: Negative.   Psychiatric/Behavioral: Positive for depression and suicidal ideas. The patient is nervous/anxious.     Blood pressure 92/58, pulse 86, temperature 98 F (36.7 C), temperature source Oral, resp. rate 16, height 6\' 1"  (1.854 m), weight 146.965 kg (324 lb).Body mass index is 42.75 kg/(m^2).  General Appearance: Casual  Eye Contact::  Fair  Speech:  Normal Rate  Volume:  Normal  Mood:  Anxious and Depressed  Affect:  Depressed  Thought Process:  Coherent  Orientation:  Full (Time, Place, and  Person)  Thought Content:  WDL  Suicidal Thoughts:  Yes with no intent  Homicidal Thoughts:  No  Memory:  Immediate;   Fair Recent;   Fair Remote;   Fair  Judgement:  Fair  Insight:  Fair  Psychomotor Activity:  Normal  Concentration:  Fair  Recall:  Fair  Akathisia:  No  Handed:  Right  AIMS (if indicated):     Assets:  Communication Skills Desire for Improvement Resilience  Sleep:  Number of Hours: 6.5    Current Medications: Current Facility-Administered Medications  Medication Dose Route Frequency Provider Last Rate Last Dose  . acetaminophen (TYLENOL) tablet 650 mg  650 mg Oral Q6H PRN Kerry Hough, PA      . albuterol (PROVENTIL HFA;VENTOLIN HFA) 108 (90 BASE) MCG/ACT inhaler 2 puff  2 puff Inhalation Q6H PRN Kerry Hough, PA      . alum & mag hydroxide-simeth (MAALOX/MYLANTA) 200-200-20 MG/5ML suspension 30 mL  30 mL Oral Q4H PRN Kerry Hough, PA      . aspirin chewable tablet 81 mg  81 mg Oral Daily Nanine Means, NP   81 mg at 09/12/12 0811  . atorvastatin (LIPITOR) tablet 10 mg  10 mg Oral Daily Kerry Hough, PA   10 mg at 09/11/12 1725  . busPIRone (BUSPAR) tablet 7.5 mg  7.5 mg Oral TID Fredrik Cove, PA-C   7.5 mg at 09/12/12 0813  . diphenhydrAMINE (BENADRYL) capsule 50 mg  50 mg  Oral Q6H PRN Nanine Means, NP   50 mg at 09/10/12 2157  . divalproex (DEPAKOTE ER) 24 hr tablet 1,000 mg  1,000 mg Oral QHS Fredrik Cove, PA-C   1,000 mg at 09/11/12 2138  . gabapentin (NEURONTIN) capsule 400 mg  400 mg Oral QID Kerry Hough, PA   400 mg at 09/12/12 0865  . hydrochlorothiazide (HYDRODIURIL) tablet 25 mg  25 mg Oral Daily Nanine Means, NP   25 mg at 09/12/12 0811  . hydrOXYzine (ATARAX/VISTARIL) tablet 25 mg  25 mg Oral Q4H Nanine Means, NP   25 mg at 09/12/12 7846  . levothyroxine (SYNTHROID, LEVOTHROID) tablet 175 mcg  175 mcg Oral QAC breakfast Kerry Hough, PA   175 mcg at 09/12/12 9629  . lisinopril (PRINIVIL,ZESTRIL) tablet 20 mg  20 mg Oral BID  Kerry Hough, PA   20 mg at 09/11/12 1725  . magnesium hydroxide (MILK OF MAGNESIA) suspension 30 mL  30 mL Oral Daily PRN Kerry Hough, PA      . mirtazapine (REMERON SOL-TAB) disintegrating tablet 30 mg  30 mg Oral QHS,MR X 1 Mickeal Skinner, MD   30 mg at 09/11/12 2236  . nicotine (NICODERM CQ - dosed in mg/24 hours) patch 21 mg  21 mg Transdermal Q0600 Kerry Hough, PA   21 mg at 09/12/12 5284  . QUEtiapine (SEROQUEL XR) 24 hr tablet 400 mg  400 mg Oral Q2000 Fredrik Cove, PA-C   400 mg at 09/11/12 2011    Lab Results: No results found for this or any previous visit (from the past 48 hour(s)).  Physical Findings: AIMS: Facial and Oral Movements Muscles of Facial Expression: None, normal Lips and Perioral Area: None, normal Jaw: None, normal Tongue: None, normal,Extremity Movements Upper (arms, wrists, hands, fingers): None, normal Lower (legs, knees, ankles, toes): None, normal, Trunk Movements Neck, shoulders, hips: None, normal, Overall Severity Severity of abnormal movements (highest score from questions above): None, normal Incapacitation due to abnormal movements: None, normal Patient's awareness of abnormal movements (rate only patient's report): No Awareness, Dental Status Current problems with teeth and/or dentures?: No Does patient usually wear dentures?: No  CIWA:  CIWA-Ar Total: 0  COWS:  COWS Total Score: 0   Treatment Plan Summary: Daily contact with patient to assess and evaluate symptoms and progress in treatment Medication management  Plan:  Review of chart, vital signs, medications, and notes. 1-Admit for crisis management and stabilization.  Estimated length of stay 1-3 days past his current stay of 11 2-Individual and group therapy encouraged 3-Medication management for depression and anxiety to reduce current symptoms to base line and improve the patient's overall level of functioning:  Prozac discontinued per MD based on his increased agitation,  will continue to monitor 4-Coping skills for depression and anxiety developing-- 5-Continue crisis stabilization and management 6-Address health issues--monitoring blood pressures, stable 7-Treatment plan in progress to prevent relapse of depression and anxiety 8-Psychosocial education regarding relapse prevention and self-care  Medical Decision Making Problem Points:  Established problem, stable/improving (1) and Review of psycho-social stressors (1) Data Points:  Review of new medications or change in dosage (2) Review or order of Psychological tests (1)  Reviewed note and discussed with the provider and agree with the above plan. I certify that inpatient services furnished can reasonably be expected to improve the patient's condition.   ADAMS,MICKIE D.RPA-C CAQ-Psych  09/12/2012, 9:57 AM

## 2012-09-12 NOTE — Progress Notes (Signed)
[facility] Group Notes:  (Nursing/MHT/Case Management/Adjunct)  Date:  09/12/2012  Time:  1:40 AM  Type of Therapy:  Psychoeducational Skills  Participation Level:  Active  Participation Quality:  Appropriate  Affect:  Appropriate  Cognitive:  Appropriate  Insight:  Good  Engagement in Group:  Engaged  Modes of Intervention:  Education  Summary of Progress/Problems: The patient stated in group this evening that he was pleased that his medications have been changed and that he is optimistic about their outcome. He did however state that he would like to take something for his anxiety and that the Vistaril has not been effective. In addition, he verbalized that he had a disagreement with a nurse.   Rodney Leach S 09/12/2012, 1:40 AM

## 2012-09-12 NOTE — Progress Notes (Signed)
Adult Psychoeducational Group Note  Date:  09/12/2012 Time:  1015  Group Topic/Focus:  Healthy Support Networks  Participation Level:  Did Not Attend. Pt is asleep in bed. Pt has been sleep deprived since admission.   Rodney Leach 09/12/2012, 11:21 AM

## 2012-09-12 NOTE — Clinical Social Work Psychosocial (Signed)
[facility] Group Notes:  (Clinical Social Work)  09/12/2012   3:00-4:00PM  Summary of Progress/Problems:   Summary of Progress/Problems:   The main focus of today's process group was to define "support" and describe what healthy supports are.  We then discussed how and why to increase patient supports, using motivational interviewing.  An emphasis was placed on using counselor, doctor, therapy groups, self-help groups and problem-specific support groups to expand supports.   The patient expressed thorough understanding of need for supports, and need to remind himself that he needs to receive as well as give support.  At one point he became tearful in response to a story by a younger patient, and said that it reminded him of a time in his search for sobriety when he called his sponsor who had 18 years sober, only to find that person drunk.  He then went to the sponsor's house to help him, only to end up drunk himself.  Type of Therapy:  Process Group  Participation Level:  Active  Participation Quality:  Attentive and Sharing  Affect:  Blunted and Depressed  Cognitive:  Appropriate  Insight:  Engaged  Engagement in Therapy:  Engaged  Modes of Intervention:  Education,  Teacher, English as a foreign language, Exploration, Discussion   Ambrose Mantle, LCSW 09/12/2012, 4:34 PM

## 2012-09-13 MED ORDER — DIVALPROEX SODIUM ER 500 MG PO TB24
500.0000 mg | ORAL_TABLET | Freq: Every day | ORAL | Status: DC
  Administered 2012-09-13 – 2012-09-15 (×3): 500 mg via ORAL
  Filled 2012-09-13 (×4): qty 1

## 2012-09-13 NOTE — Progress Notes (Signed)
[facility] MD Progress Note  09/13/2012 8:58 AM Rodney Leach  MRN:  [id number] Subjective:  8/10 depression, 5/10 anxiety, complains of side effects of Depakote--"crazy dreams" and "groggy" Diagnosis:   Axis I: Anxiety Disorder NOS and Bipolar, Depressed Axis II: Deferred Axis III:  Past Medical History  Diagnosis Date  . Stroke     "light" stroke  . Hypothyroidism   . Sleep apnea     CPAP  . Headache   . Arthritis   . Complication of anesthesia     has woken up during surgery before   . Hypertension   . Bipolar affective    Axis IV: economic problems, occupational problems, other psychosocial or environmental problems and problems related to social environment Axis V: 41-50 serious symptoms  ADL's:  Intact  Sleep: Good  Appetite:  Fair  Suicidal Ideation:  Plan:  Cut wrist Intent:  None Means:  None Homicidal Ideation:  Denies  Psychiatric Specialty Exam: Review of Systems  Constitutional: Negative.   HENT: Negative.   Eyes: Negative.   Respiratory: Negative.   Cardiovascular: Negative.   Gastrointestinal: Negative.   Genitourinary: Negative.   Musculoskeletal: Negative.   Skin: Negative.   Neurological: Negative.   Endo/Heme/Allergies: Negative.   Psychiatric/Behavioral: Positive for depression and suicidal ideas. The patient is nervous/anxious.     Blood pressure 103/71, pulse 91, temperature 97.5 F (36.4 C), temperature source Oral, resp. rate 18, height 6\' 1"  (1.854 m), weight 146.965 kg (324 lb).Body mass index is 42.75 kg/(m^2).  General Appearance: Casual  Eye Contact::  Fair  Speech:  Normal Rate  Volume:  Normal  Mood:  Anxious and Depressed  Affect:  Congruent  Thought Process:  Coherent  Orientation:  Full (Time, Place, and Person)  Thought Content:  WDL  Suicidal Thoughts:  Passive, occasional with a plan to cut his wrist  Homicidal Thoughts:  No  Memory:  Immediate;   Fair Recent;   Fair Remote;   Fair  Judgement:  Fair  Insight:  Fair   Psychomotor Activity:  Decreased  Concentration:  Fair  Recall:  Fair  Akathisia:  No  Handed:  Right  AIMS (if indicated):     Assets:  Communication Skills Desire for Improvement Social Support  Sleep:  Number of Hours: 6.25    Current Medications: Current Facility-Administered Medications  Medication Dose Route Frequency Provider Last Rate Last Dose  . acetaminophen (TYLENOL) tablet 650 mg  650 mg Oral Q6H PRN Kerry Hough, PA      . albuterol (PROVENTIL HFA;VENTOLIN HFA) 108 (90 BASE) MCG/ACT inhaler 2 puff  2 puff Inhalation Q6H PRN Kerry Hough, PA      . alum & mag hydroxide-simeth (MAALOX/MYLANTA) 200-200-20 MG/5ML suspension 30 mL  30 mL Oral Q4H PRN Kerry Hough, PA      . aspirin chewable tablet 81 mg  81 mg Oral Daily Nanine Means, NP   81 mg at 09/13/12 0824  . atorvastatin (LIPITOR) tablet 10 mg  10 mg Oral Daily Kerry Hough, PA   10 mg at 09/12/12 1714  . busPIRone (BUSPAR) tablet 7.5 mg  7.5 mg Oral TID Fredrik Cove, PA-C   7.5 mg at 09/13/12 1478  . diphenhydrAMINE (BENADRYL) capsule 50 mg  50 mg Oral Q6H PRN Nanine Means, NP   50 mg at 09/12/12 2128  . divalproex (DEPAKOTE ER) 24 hr tablet 1,000 mg  1,000 mg Oral QHS Fredrik Cove, PA-C   1,000 mg at 09/12/12  2128  . gabapentin (NEURONTIN) capsule 400 mg  400 mg Oral QID Kerry Hough, PA   400 mg at 09/13/12 0825  . hydrochlorothiazide (HYDRODIURIL) tablet 25 mg  25 mg Oral Daily Nanine Means, NP   25 mg at 09/13/12 0826  . hydrOXYzine (ATARAX/VISTARIL) tablet 25 mg  25 mg Oral Q4H Nanine Means, NP   25 mg at 09/13/12 9811  . levothyroxine (SYNTHROID, LEVOTHROID) tablet 175 mcg  175 mcg Oral QAC breakfast Kerry Hough, PA   175 mcg at 09/13/12 9147  . lisinopril (PRINIVIL,ZESTRIL) tablet 20 mg  20 mg Oral BID Kerry Hough, PA   20 mg at 09/13/12 8295  . magnesium hydroxide (MILK OF MAGNESIA) suspension 30 mL  30 mL Oral Daily PRN Kerry Hough, PA      . mirtazapine (REMERON SOL-TAB)  disintegrating tablet 30 mg  30 mg Oral QHS,MR X 1 Mickeal Skinner, MD   30 mg at 09/12/12 2226  . nicotine (NICODERM CQ - dosed in mg/24 hours) patch 21 mg  21 mg Transdermal Q0600 Kerry Hough, PA   21 mg at 09/13/12 6213  . QUEtiapine (SEROQUEL XR) 24 hr tablet 400 mg  400 mg Oral Q2000 Fredrik Cove, PA-C   400 mg at 09/12/12 1951    Lab Results: No results found for this or any previous visit (from the past 48 hour(s)).  Physical Findings: AIMS: Facial and Oral Movements Muscles of Facial Expression: None, normal Lips and Perioral Area: None, normal Jaw: None, normal Tongue: None, normal,Extremity Movements Upper (arms, wrists, hands, fingers): None, normal Lower (legs, knees, ankles, toes): None, normal, Trunk Movements Neck, shoulders, hips: None, normal, Overall Severity Severity of abnormal movements (highest score from questions above): None, normal Incapacitation due to abnormal movements: None, normal Patient's awareness of abnormal movements (rate only patient's report): No Awareness, Dental Status Current problems with teeth and/or dentures?: No Does patient usually wear dentures?: No  CIWA:  CIWA-Ar Total: 0  COWS:  COWS Total Score: 0   Treatment Plan Summary: Daily contact with patient to assess and evaluate symptoms and progress in treatment Medication management  Plan:  Review of chart, vital signs, medications, and notes. 1-Individual and group therapy encouraged 2-Medication management for depression, mood stability, and anxiety:  Medications reviewed with the patient and he complained of having "crazy dreams" and feeling "groggy" since he started Depakote, will review with the MD, educated him regarding the "groggy" would decrease with time.  He admitted to brief suicidal thoughts off and on, he described it like a flash of himself cutting his wrist--no intent.  Anthonie did report that his anxiety was much improved with the Buspar. 3-Coping skills for  depression, anxiety, and mood stability 4-Continue crisis stabilization and management 5-Address health issues--stable 6-Treatment plan in progress to prevent relapse of depression, mood stability, and anxiety  Medical Decision Making Problem Points:  Established problem, stable/improving (1) and Review of psycho-social stressors (1) Data Points:  Review of medication regiment & side effects (2) Review of new medications or change in dosage (2)  I certify that inpatient services furnished can reasonably be expected to improve the patient's condition.   Nanine Means, PMH-NP 09/13/2012, 8:58 AM

## 2012-09-13 NOTE — Progress Notes (Signed)
D: Patient in dayroom on approach.  Patient had just finished card game with peers.  Patient states his day was terrible.  Patient states his medication needed adjusting.  Patient states he felt he was taking too much Depakote.  Patient states he felt like he was going to jump out of his skin.  Patient state she is having SI with no plan at the moment.  Patient verbally contracts for safety.  Patient denies HI and denies AVH.  Patient states that since he is not taking the large amount of Depakote patient states his suicidal thoughts with a plan have ceased.  A: Staff to monitor Q 15 mins for safety.  Encouragement and support offered.  Scheduled medications administered per orders. R: Patient remains safe on the unit.  Patient attended group tonight.  Patient taking administered medications.  Patient calm and cooperative.

## 2012-09-13 NOTE — Progress Notes (Signed)
[facility] LCSW Aftercare Discharge Planning Group Note  09/13/2012 1:17 PM  Participation Quality:  Appropriate and Attentive  Affect:  Appropriate, Blunted, Depressed and Flat  Cognitive:  Alert and Appropriate  Insight:  Engaged  Engagement in Group:  Engaged  Modes of Intervention:  Education, Exploration, Problem-solving, Rapport Building and Support  Summary of Progress/Problems:  Patient continues to endorse SI but contracts for safety.  He shared new medications were started on Saturday but not working at this time.  He stated he is exhausted and has crazy dreams.  [name] 09/13/2012, 1:17 PM

## 2012-09-13 NOTE — Progress Notes (Addendum)
D:  Patient's self inventory sheet, patient has poor sleep, good appetite, low energy level, improving attention span.  Rated depression and hopelessness #8.  Denied withdrawals.  SI off/on, contracts for safety.  Has felt pain, headache, low back pain (surgery 2 years ago).  Zero pain goal, worst pain #7.   Wants to take depakote earlier in day.  Feels disoriented in morning and believes Depakote is cause.  Believes depakote also causes "crazy dreams".  Denied HI.   Denied A/V hallucinations.  A:  Medications administered per MD order.  Support and encouragement given throughout day.  Support and safety checks completed as ordered. R:  Following treatment plan.  Denied HI.  Denied A/V hallucinations.  SI off/on, contracts for safety.  Patient remains safe and receptive on unit. Patient's wife called this morning asking about patient's status.  Patient called wife back after group.  Patient stated he has called his wife twice today, stated she is concerned about him.

## 2012-09-13 NOTE — Tx Team (Signed)
Interdisciplinary Treatment Plan Update (Adult)  Date:  09/13/2012  Time Reviewed:  10:00 AM   Progress in Treatment: Attending groups:   Yes   Participating in groups:  Yes Taking medication as prescribed:  Yes Tolerating medication:  Yes Family/Significant othe contact made: Contact made with family Patient understands diagnosis:  Yes Discussing patient identified problems/goals with staff: Yes Medical problems stabilized or resolved: Yes Denies suicidal/homicidal ideation: No, but contracts for safety Issues/concerns per patient self-inventory:  Other:   New problem(s) identified:  Reason for Continuation of Hospitalization: Anxiety Depression Medication stabilization Suicidal ideation  Interventions implemented related to continuation of hospitalization:  Medication Management; safety checks q 15 mins  Additional comments:  Estimated length of stay:  2-3 days  Discharge Plan:  Home with outpatient follow up  New goal(s):  Review of initial/current patient goals per problem list:    1.  Goal(s): Eliminate SI/other thoughts of self harm (Patient will no longer endorse SI/HI or thoughts of self harm)   Met:  No  Target date: d/c  As evidenced by: Patient continues to endorse SI but contracts for safety    2.  Goal (s):Reduce depression/anxiety (Patient will rate symptoms at four or below)  Met: No  Target date: d/c  As evidenced by: Patient rates symptoms at eight today    3.  Goal(s):.stabilize on meds (Patient will report being stabilized on medications)   Met:  No  Target date: d/c  As evidenced by: Patient continues to endorse symptom    4.  Goal(s): Refer for outpatient follow up (Follow up appointment will be scheduled)   Met:  No  Target date: d/c  As evidenced by: Follow up appointment to be scheduled    Attendees: Patient:   09/13/2012 10:00 AM  Physican:  Patrick North, MD 09/13/2012 10:00 AM  Nursing:  Neill Loft, RN  09/13/2012 10:00 AM   Nursing:   Berneice Heinrich, RN 09/13/2012 10:00 AM   Clinical Social Worker:  Juline Patch, LCSW 09/13/2012 10:00 AM   Other: Tera Helper, PHM-NP 09/13/2012 10:00 AM   Other:   09/13/2012 10:00 AM Other:        09/13/2012 10:00 AM

## 2012-09-13 NOTE — Progress Notes (Signed)
Patient observed in the dayroom watching tv when writer asked to speak with him at the medication window. Patient received his scheduled 2000 medication and he reports that he has slept quite a bit today and missed a few groups. Patient feels that the medication is helping with his racing thoughts but he reports he still has si but not as intense. Patient voiced no complaints and is offered support and encouragement. Patient denies hi/a/v hallucinations. Safety maintained on unit, will continue to monitor.

## 2012-09-13 NOTE — Progress Notes (Signed)
Reviewed

## 2012-09-13 NOTE — Progress Notes (Signed)
Reviewed. Patient seen, medication adjustments discussed with NP.

## 2012-09-13 NOTE — Progress Notes (Signed)
[facility] LCSW Group Therapy  .overcoming   09/13/2012 4:10 PM  Type of Therapy:  Group Therapy  Participation Level:  Did Not Attend  [name] 09/13/2012, 4:10 PM

## 2012-09-14 MED ORDER — MIRTAZAPINE 15 MG PO TBDP
15.0000 mg | ORAL_TABLET | Freq: Every evening | ORAL | Status: DC | PRN
  Administered 2012-09-14 (×2): 15 mg via ORAL
  Filled 2012-09-14 (×6): qty 1

## 2012-09-14 NOTE — Progress Notes (Signed)
[facility] LCSW Group Therapy      Feelings About Diagnosis 1:15 - 2:30          09/14/2012 3:35 PM  Type of Therapy:  Group Therapy  Participation Level:  Active  Participation Quality:  Appropriate and Attentive  Affect:  Appropriate and Depressed  Cognitive:  Alert and Appropriate  Insight:  Engaged  Engagement in Therapy:  Engaged  Modes of Intervention:  Discussion, Exploration, Problem-solving, Rapport Building and Support  Summary of Progress/Problems: Patient shared he has difficult wrapping his head around having a diagnosis of Bipolar.  He shared he has to focus on identifying triggers to his illness and that includes people he does not need to be around.  [name] 09/14/2012, 3:35 PM

## 2012-09-14 NOTE — Progress Notes (Signed)
[facility] MD Progress Note  09/14/2012 10:11 AM Rodney Leach  MRN:  [id number] Subjective:  Patient complaining of increased grogginess this morning. He reports mood is improving. Continues to have vague suicidal thoughts. He was started on Remeron at 30mg  which patient was unaware of. Diagnosis:   Axis I: Bipolar, mixed Axis II: Deferred Axis III:  Past Medical History  Diagnosis Date  . Stroke     "light" stroke  . Hypothyroidism   . Sleep apnea     CPAP  . Headache   . Arthritis   . Complication of anesthesia     has woken up during surgery before   . Hypertension   . Bipolar affective    Axis IV: occupational problems and other psychosocial or environmental problems Axis V: 41-50 serious symptoms  ADL's:  Intact  Sleep: Fair  Appetite:  Fair    Psychiatric Specialty Exam: Review of Systems  Constitutional: Negative.   HENT: Negative.   Eyes: Negative.   Respiratory: Negative.   Cardiovascular: Negative.   Gastrointestinal: Negative.   Genitourinary: Negative.   Musculoskeletal: Negative.   Skin: Negative.   Neurological: Negative.   Endo/Heme/Allergies: Negative.   Psychiatric/Behavioral: Positive for depression. The patient is nervous/anxious and has insomnia.     Blood pressure 111/78, pulse 88, temperature 97.5 F (36.4 C), temperature source Oral, resp. rate 20, height 6\' 1"  (1.854 m), weight 146.965 kg (324 lb).Body mass index is 42.75 kg/(m^2).  General Appearance: Disheveled  Eye Solicitor::  Fair  Speech:  Slow  Volume:  Normal  Mood:  Anxious and Dysphoric  Affect:  Constricted and Depressed  Thought Process:  Coherent  Orientation:  Full (Time, Place, and Person)  Thought Content:  WDL  Suicidal Thoughts:  Yes.  without intent/plan  Homicidal Thoughts:  No  Memory:  Immediate;   Fair Recent;   Fair Remote;   Fair  Judgement:  Fair  Insight:  Fair  Psychomotor Activity:  decreased  Concentration:  Fair  Recall:  Fair  Akathisia:  No   Handed:  Right  AIMS (if indicated):     Assets:  Communication Skills Desire for Improvement Housing Social Support  Sleep:  Number of Hours: 6.25    Current Medications: Current Facility-Administered Medications  Medication Dose Route Frequency Provider Last Rate Last Dose  . acetaminophen (TYLENOL) tablet 650 mg  650 mg Oral Q6H PRN Kerry Hough, PA      . albuterol (PROVENTIL HFA;VENTOLIN HFA) 108 (90 BASE) MCG/ACT inhaler 2 puff  2 puff Inhalation Q6H PRN Kerry Hough, PA      . alum & mag hydroxide-simeth (MAALOX/MYLANTA) 200-200-20 MG/5ML suspension 30 mL  30 mL Oral Q4H PRN Kerry Hough, PA      . aspirin chewable tablet 81 mg  81 mg Oral Daily Nanine Means, NP   81 mg at 09/14/12 0801  . atorvastatin (LIPITOR) tablet 10 mg  10 mg Oral Daily Kerry Hough, PA   10 mg at 09/13/12 1724  . busPIRone (BUSPAR) tablet 7.5 mg  7.5 mg Oral TID Fredrik Cove, PA-C   7.5 mg at 09/14/12 0801  . diphenhydrAMINE (BENADRYL) capsule 50 mg  50 mg Oral Q6H PRN Nanine Means, NP   50 mg at 09/12/12 2128  . divalproex (DEPAKOTE ER) 24 hr tablet 500 mg  500 mg Oral QHS Nanine Means, NP   500 mg at 09/13/12 2133  . gabapentin (NEURONTIN) capsule 400 mg  400 mg Oral QID Karleen Hampshire  E Simon, PA   400 mg at 09/14/12 0802  . hydrochlorothiazide (HYDRODIURIL) tablet 25 mg  25 mg Oral Daily Nanine Means, NP   25 mg at 09/14/12 0801  . hydrOXYzine (ATARAX/VISTARIL) tablet 25 mg  25 mg Oral Q4H Nanine Means, NP   25 mg at 09/14/12 0654  . levothyroxine (SYNTHROID, LEVOTHROID) tablet 175 mcg  175 mcg Oral QAC breakfast Kerry Hough, PA   175 mcg at 09/14/12 1610  . lisinopril (PRINIVIL,ZESTRIL) tablet 20 mg  20 mg Oral BID Kerry Hough, PA   20 mg at 09/14/12 0802  . magnesium hydroxide (MILK OF MAGNESIA) suspension 30 mL  30 mL Oral Daily PRN Kerry Hough, PA      . mirtazapine (REMERON SOL-TAB) disintegrating tablet 30 mg  30 mg Oral QHS,MR X 1 Mickeal Skinner, MD   30 mg at 09/13/12 2221  .  nicotine (NICODERM CQ - dosed in mg/24 hours) patch 21 mg  21 mg Transdermal Q0600 Kerry Hough, PA   21 mg at 09/14/12 0655  . QUEtiapine (SEROQUEL XR) 24 hr tablet 400 mg  400 mg Oral Q2000 Fredrik Cove, PA-C   400 mg at 09/13/12 2007    Lab Results: No results found for this or any previous visit (from the past 48 hour(s)).  Physical Findings: AIMS: Facial and Oral Movements Muscles of Facial Expression: None, normal Lips and Perioral Area: None, normal Jaw: None, normal Tongue: None, normal,Extremity Movements Upper (arms, wrists, hands, fingers): None, normal Lower (legs, knees, ankles, toes): None, normal, Trunk Movements Neck, shoulders, hips: None, normal, Overall Severity Severity of abnormal movements (highest score from questions above): None, normal Incapacitation due to abnormal movements: None, normal Patient's awareness of abnormal movements (rate only patient's report): No Awareness, Dental Status Current problems with teeth and/or dentures?: No Does patient usually wear dentures?: No  CIWA:  CIWA-Ar Total: 1  COWS:  COWS Total Score: 1   Treatment Plan Summary: Daily contact with patient to assess and evaluate symptoms and progress in treatment Medication management  Plan: Decrease dose of Remeron. Start planning for discharge.  Medical Decision Making Problem Points:  Established problem, stable/improving (1), Review of last therapy session (1) and Review of psycho-social stressors (1) Data Points:  Review of medication regiment & side effects (2) Review of new medications or change in dosage (2)  I certify that inpatient services furnished can reasonably be expected to improve the patient's condition.   Rodney Leach 09/14/2012, 10:11 AM

## 2012-09-14 NOTE — Progress Notes (Signed)
[facility] LCSW Aftercare Discharge Planning Group Note  09/14/2012 11:29 AM  Participation Quality:  Appropriate and Attentive  Affect:  Appropriate, Blunted, Depressed and Flat  Cognitive:  Alert and Appropriate  Insight:  Engaged  Engagement in Group:  Engaged  Modes of Intervention:  Education, Exploration, Problem-solving, Rapport Building and Support  Summary of Progress/Problems:   Patient reports being a little better today and resting well last night.  He continues to endorse SI but contracts for safety.  He rates symptoms at seven today.  Patient shared he sleep remains choppy.  [name] 09/14/2012, 11:29 AM

## 2012-09-14 NOTE — Progress Notes (Signed)
D - Patient up and active on the unit, interacting with peers appropriately. Patient continues to complain of feeling tired, appears more alert this afternoon. Patient has attended group today, participating appropriately. Patient rated is depression "7" and his hopelessness "7." Patient continues to have passive SI, but verbally contracts for safety. Patient denies HI auditory or visual.  A - Patient offered encouragement and support through therapeutic conversation. Encouraged patient to speak with staff about any concerns or questions. Medications given as ordered.  R - Patient safety maintained with Q 15 minute checks. Patient remains safe on the unit.

## 2012-09-14 NOTE — Progress Notes (Signed)
Psychoeducational Group Note  Date:  09/14/2012 Time:1100  Group Topic/Focus:  Recovery Goals:   The focus of this group is to identify appropriate goals for recovery and establish a plan to achieve them.  Participation Level: Did Not Attend  Participation Quality:  Not Applicable  Affect:  Not Applicable  Cognitive:  Not Applicable  Insight:  Not Applicable  Engagement in Group: Not Applicable  Additional Comments:  Patient did not attend group, patient stated his medication was making him drowsy and stated the doctor was to make changes for him.  Karleen Hampshire Brittini 09/14/2012, 6:21 PM

## 2012-09-14 NOTE — Progress Notes (Signed)
Patient ID: Rodney Leach, male   DOB: [birth year], 52 y.o.   MRN: [id number] D- Patient reports fair sleep but inability to face up in am.  He fees groggy and talked with MD about adjusting med at HS.  His energy level is low and his ability to pay attention is improving.  He has been having suicidal thoughts off and on.  A- Asked patient to contract for saftey and he agrees to tell staff if he fees unsafe.  Patient's wife Cherie called to see how he is doing.  She expressed concern that patient's mother was allowed to visit yesterday and "she is his main trigger."  Wife concerned and says she and patient have talked about how contact with mother  is not healthy for patient.

## 2012-09-15 DIAGNOSIS — F319 Bipolar disorder, unspecified: Secondary | ICD-10-CM

## 2012-09-15 MED ORDER — ZOLPIDEM TARTRATE 5 MG PO TABS
5.0000 mg | ORAL_TABLET | Freq: Every evening | ORAL | Status: DC | PRN
  Administered 2012-09-15: 5 mg via ORAL
  Filled 2012-09-15: qty 1

## 2012-09-15 MED ORDER — QUETIAPINE FUMARATE ER 300 MG PO TB24
300.0000 mg | ORAL_TABLET | Freq: Every day | ORAL | Status: DC
  Administered 2012-09-15: 300 mg via ORAL
  Filled 2012-09-15 (×2): qty 1

## 2012-09-15 NOTE — Progress Notes (Signed)
Report received from C. Liles RN. Writer entered patients room and observed patient lying in bed asleep with c-pap machine on. Safety  Maintained with 15 min checks, will continue to monitor.

## 2012-09-15 NOTE — Progress Notes (Signed)
[facility] LCSW Group Therapy        Emotional Regulation 1:15 - 2:30 PM        09/15/2012 3:01 PM  Type of Therapy:  Group Therapy  Participation Level:  Active  Participation Quality:  Appropriate and Attentive  Affect:  Appropriate and Depressed  Cognitive:  Alert and Appropriate  Insight:  Engaged  Engagement in Therapy:  Engaged  Modes of Intervention:  Discussion, Exploration, Problem-solving, Rapport Building and Support  Summary of Progress/Problems: Patient shared he needs to learn to advocate for himself.  He shared he continues to be frustrated for not speaking up for his needs during treatment team.  Patient encouraged to learn to be more assertive as a means of controlling his emotions.   Patient was reminded that treatment team is about him letting others know his needs.  [name] 09/15/2012, 3:01 PM

## 2012-09-15 NOTE — Tx Team (Signed)
Interdisciplinary Treatment Plan Update (Adult)  Date:  09/15/2012  Time Reviewed:  11:00 AM   Progress in Treatment: Attending groups:   Yes   Participating in groups:  Yes Taking medication as prescribed:  Yes Tolerating medication:  Yes Family/Significant othe contact made: Contact made with family Patient understands diagnosis:  Yes Discussing patient identified problems/goals with staff: Yes Medical problems stabilized or resolved: Yes Denies suicidal/homicidal ideation:No, but contracts for safety Issues/concerns per patient self-inventory:  Other:   New problem(s) identified:  Reason for Continuation of Hospitalization: Anxiety Depression Medication stabilization Suicidal ideation  Interventions implemented related to continuation of hospitalization:  Medication Management; safety checks q 15 mins  Additional comments:  Estimated length of stay:  2-3 days  Discharge Plan:  Home with outpatient follow up  New goal(s):  Review of initial/current patient goals per problem list:    1.  Goal(s): Eliminate SI/other thoughts of self harm (Patient will no longer endorse SI/HI or thoughts of self harm)   Met:  No  Target date: d/c  As evidenced by: Patient endorses SI but no plan - contracts for safety    2.  Goal (s):Reduce depression/anxiety (Paitent will rate symptoms at four or below)  Met: No  Target date: d/c  As evidenced by: Patient rates symptoms at seven and nine    3.  Goal(s):.stabilize on meds (Patient will report being stabilized on medications - less symptomatic)   Met:  No  Target date: d/c  As evidenced by: Patient continues to report mind races all the time    4.  Goal(s): Refer for outpatient follow up (Follow up appointment will be scheduled)   Met:  No  Target date: d/c  As evidenced by: Follow up appointment not scheduled    Attendees: Patient:  Rodney Leach 09/15/2012 11:00 AM  Physican:  Patrick North, MD  09/15/2012 11:00 AM  Nursing:    Chinita Greenland, RN 09/15/2012 11:00 AM   Nursing:     Harold Barban, RN 09/15/2012 11:00 AM   Clinical Social Worker:  Juline Patch, LCSW 09/15/2012 11:00 AM   Other: Tera Helper, PHM-NP 09/15/2012 11:00 AM   Other:   09/15/2012 11:00 AM Other:        09/15/2012 11:00 AM

## 2012-09-15 NOTE — Progress Notes (Signed)
Psychoeducational Group Note  Date:  09/15/2012 Time: 1100  Group Topic/Focus:  Personal Choices and Values:   The focus of this group is to help patients assess and explore the importance of values in their lives, how their values affect their decisions, how they express their values and what opposes their expression.  Participation Level: Did Not Attend  Participation Quality:  Not Applicable  Affect:  Not Applicable  Cognitive:  Not Applicable  Insight:  Not Applicable  Engagement in Group: Not Applicable  Additional Comments: Rodney Leach did not attend group, patient remained in bed. Patient has stated the medication has made him tiresome.  Karleen Hampshire Brittini 09/15/2012, 3:24 PM

## 2012-09-15 NOTE — Progress Notes (Signed)
D: Rodney Leach reports that he slept poorly, only going to sleep after 1AM.  His sleep was restless and he had uneasy dreams.  This morning he was frustrated was not sleeping and stayed in bed late.  He states that he has off and on SI, but does contract for safety.  He feels that he would be better if he was sleeping better.  He is rating depression at 7/10 and hopelessness 7/10.   A:  Emotional support given, encouraged patient to get up and stay busy on the unit to increase chance of sleep tonight.  Medications given as ordered.  Safety checks q 15 minutes. R:  Safety maintained on unit.

## 2012-09-15 NOTE — Progress Notes (Signed)
[facility] Group Notes:  (Nursing/MHT/Case Management/Adjunct)  Type of Therapy:  Psychoeducational Skills  Participation Level:  Did Not Attend  Summary of Progress/Problems: Brodey c/o being drowsy and did not attend psychoeducational group on labels.    Wandra Scot 09/15/2012 12:51 PM

## 2012-09-15 NOTE — Progress Notes (Signed)
[facility] MD Progress Note  09/15/2012 1:47 PM Rodney Leach  MRN:  [id number] Subjective:  Patient upset at not sleeping well, states he wakes up hourly. He would like to try a different medicine. Continues to have depressed mood.  Diagnosis:   Axis I: Bipolar disorder Axis II: Deferred Axis III:  Past Medical History  Diagnosis Date  . Stroke     "light" stroke  . Hypothyroidism   . Sleep apnea     CPAP  . Headache   . Arthritis   . Complication of anesthesia     has woken up during surgery before   . Hypertension   . Bipolar affective    Axis IV: other psychosocial or environmental problems Axis V: 51-60 moderate symptoms  ADL's:  Intact  Sleep: Poor  Appetite:  Fair   Psychiatric Specialty Exam: Review of Systems  Constitutional: Positive for malaise/fatigue.  HENT: Negative.   Eyes: Negative.   Respiratory: Negative.   Cardiovascular: Negative.   Gastrointestinal: Negative.   Genitourinary: Negative.   Musculoskeletal: Negative.   Skin: Negative.   Neurological: Negative.   Endo/Heme/Allergies: Negative.   Psychiatric/Behavioral: Positive for depression and suicidal ideas. The patient is nervous/anxious and has insomnia.     Blood pressure 123/74, pulse 86, temperature 97.6 F (36.4 C), temperature source Oral, resp. rate 20, height 6\' 1"  (1.854 m), weight 146.965 kg (324 lb).Body mass index is 42.75 kg/(m^2).  General Appearance: Disheveled  Eye Solicitor::  Fair  Speech:  Slow  Volume:  Increased  Mood:  Anxious, Dysphoric and Irritable  Affect:  Constricted and Depressed  Thought Process:  Coherent  Orientation:  Full (Time, Place, and Person)  Thought Content:  WDL  Suicidal Thoughts:  yes  Homicidal Thoughts:  No  Memory:  Immediate;   Fair Recent;   Fair Remote;   Fair  Judgement:  Fair  Insight:  Present  Psychomotor Activity:  Normal  Concentration:  Fair  Recall:  Fair  Akathisia:  No  Handed:  Right  AIMS (if indicated):     Assets:   Communication Skills Desire for Improvement Housing Social Support  Sleep:  Number of Hours: 6.75    Current Medications: Current Facility-Administered Medications  Medication Dose Route Frequency Provider Last Rate Last Dose  . acetaminophen (TYLENOL) tablet 650 mg  650 mg Oral Q6H PRN Kerry Hough, PA      . albuterol (PROVENTIL HFA;VENTOLIN HFA) 108 (90 BASE) MCG/ACT inhaler 2 puff  2 puff Inhalation Q6H PRN Kerry Hough, PA      . alum & mag hydroxide-simeth (MAALOX/MYLANTA) 200-200-20 MG/5ML suspension 30 mL  30 mL Oral Q4H PRN Kerry Hough, PA      . aspirin chewable tablet 81 mg  81 mg Oral Daily Nanine Means, NP   81 mg at 09/15/12 0831  . atorvastatin (LIPITOR) tablet 10 mg  10 mg Oral Daily Kerry Hough, PA   10 mg at 09/14/12 1809  . busPIRone (BUSPAR) tablet 7.5 mg  7.5 mg Oral TID Fredrik Cove, PA-C   7.5 mg at 09/15/12 1204  . diphenhydrAMINE (BENADRYL) capsule 50 mg  50 mg Oral Q6H PRN Nanine Means, NP   50 mg at 09/14/12 2130  . divalproex (DEPAKOTE ER) 24 hr tablet 500 mg  500 mg Oral QHS Nanine Means, NP   500 mg at 09/14/12 2130  . gabapentin (NEURONTIN) capsule 400 mg  400 mg Oral QID Kerry Hough, PA   400 mg  at 09/15/12 1204  . hydrochlorothiazide (HYDRODIURIL) tablet 25 mg  25 mg Oral Daily Nanine Means, NP   25 mg at 09/15/12 4098  . hydrOXYzine (ATARAX/VISTARIL) tablet 25 mg  25 mg Oral Q4H Nanine Means, NP   25 mg at 09/15/12 1311  . levothyroxine (SYNTHROID, LEVOTHROID) tablet 175 mcg  175 mcg Oral QAC breakfast Kerry Hough, PA   175 mcg at 09/15/12 [phone]  . lisinopril (PRINIVIL,ZESTRIL) tablet 20 mg  20 mg Oral BID Kerry Hough, PA   20 mg at 09/15/12 0831  . magnesium hydroxide (MILK OF MAGNESIA) suspension 30 mL  30 mL Oral Daily PRN Kerry Hough, PA      . mirtazapine (REMERON SOL-TAB) disintegrating tablet 15 mg  15 mg Oral QHS,MR X 1 Jamylah Marinaccio, MD   15 mg at 09/14/12 2212  . nicotine (NICODERM CQ - dosed in mg/24 hours) patch  21 mg  21 mg Transdermal Q0600 Kerry Hough, PA   21 mg at 09/15/12 4782  . QUEtiapine (SEROQUEL XR) 24 hr tablet 300 mg  300 mg Oral Q2000 Ryn Peine, MD        Lab Results: No results found for this or any previous visit (from the past 48 hour(s)).  Physical Findings: AIMS: Facial and Oral Movements Muscles of Facial Expression: None, normal Lips and Perioral Area: None, normal Jaw: None, normal Tongue: None, normal,Extremity Movements Upper (arms, wrists, hands, fingers): None, normal Lower (legs, knees, ankles, toes): None, normal, Trunk Movements Neck, shoulders, hips: None, normal, Overall Severity Severity of abnormal movements (highest score from questions above): None, normal Incapacitation due to abnormal movements: None, normal Patient's awareness of abnormal movements (rate only patient's report): No Awareness, Dental Status Current problems with teeth and/or dentures?: No Does patient usually wear dentures?: No  CIWA:  CIWA-Ar Total: 1  COWS:  COWS Total Score: 1   Treatment Plan Summary: Daily contact with patient to assess and evaluate symptoms and progress in treatment Medication management  Plan: Decrease seroquel to 300mg  po qhs. Discontinue Mirtazapine. Start ambien 5mg  po qhs. Plan for discharge tomorrow.  Medical Decision Making Problem Points:  Established problem, stable/improving (1), Review of last therapy session (1) and Review of psycho-social stressors (1) Data Points:  Review of medication regiment & side effects (2) Review of new medications or change in dosage (2)  I certify that inpatient services furnished can reasonably be expected to improve the patient's condition.   Diasia Henken 09/15/2012, 1:47 PM

## 2012-09-15 NOTE — Progress Notes (Signed)
[facility] LCSW Aftercare Discharge Planning Group Note  09/15/2012 11:04 AM  Participation Quality:  Appropriate and Attentive  Affect:  Appropriate, Blunted, Depressed and Flat  Cognitive:  Alert and Appropriate  Insight:  Engaged  Engagement in Group:  Engaged  Modes of Intervention:  Education, Exploration, Problem-solving, Rapport Building and Support  Summary of Progress/Problems:   Patient continues to endorse SI but able to contract for safety.  He rates anxiety at nine and all other symptoms at seven.  He continues to voice frustration due to not being able to sleep.  [name] 09/15/2012, 11:04 AM

## 2012-09-16 MED ORDER — QUETIAPINE FUMARATE ER 300 MG PO TB24: 300.0000 mg | ORAL_TABLET | Freq: Every day | ORAL | Status: DC

## 2012-09-16 MED ORDER — BUSPIRONE HCL 7.5 MG PO TABS: 7.5000 mg | ORAL_TABLET | Freq: Three times a day (TID) | ORAL | Status: DC

## 2012-09-16 MED ORDER — ASPIRIN 81 MG PO CHEW: 81.0000 mg | CHEWABLE_TABLET | Freq: Every day | ORAL | Status: DC

## 2012-09-16 MED ORDER — GABAPENTIN 400 MG PO CAPS: 400.0000 mg | ORAL_CAPSULE | Freq: Four times a day (QID) | ORAL | Status: DC

## 2012-09-16 MED ORDER — ATORVASTATIN CALCIUM 10 MG PO TABS: 10.0000 mg | ORAL_TABLET | Freq: Every day | ORAL | Status: DC

## 2012-09-16 MED ORDER — HYDROXYZINE HCL 25 MG PO TABS: 25.0000 mg | ORAL_TABLET | ORAL | Status: DC

## 2012-09-16 MED ORDER — ALBUTEROL SULFATE HFA 108 (90 BASE) MCG/ACT IN AERS: 2.0000 | INHALATION_SPRAY | Freq: Four times a day (QID) | RESPIRATORY_TRACT | Status: DC | PRN

## 2012-09-16 MED ORDER — DIVALPROEX SODIUM ER 500 MG PO TB24: 500.0000 mg | ORAL_TABLET | Freq: Every day | ORAL | Status: DC

## 2012-09-16 MED ORDER — HYDROCHLOROTHIAZIDE 25 MG PO TABS: 25.0000 mg | ORAL_TABLET | Freq: Every day | ORAL | Status: DC

## 2012-09-16 MED ORDER — LISINOPRIL 20 MG PO TABS: 20.0000 mg | ORAL_TABLET | Freq: Two times a day (BID) | ORAL | Status: DC

## 2012-09-16 MED ORDER — LEVOTHYROXINE SODIUM 175 MCG PO TABS: 175.0000 ug | ORAL_TABLET | Freq: Every day | ORAL | Status: DC

## 2012-09-16 MED ORDER — ZOLPIDEM TARTRATE 5 MG PO TABS: 5.0000 mg | ORAL_TABLET | Freq: Every evening | ORAL | Status: DC | PRN

## 2012-09-16 NOTE — Progress Notes (Signed)
[facility] LCSW Aftercare Discharge Planning Group Note  09/16/2012 12:15 PM  Participation Quality:  Appropriate and Attentive  Affect:  Appropriate  Cognitive:  Alert and Appropriate  Insight:  Engaged  Engagement in Group:  Engaged  Modes of Intervention:  Exploration, Problem-solving and Support  Summary of Progress/Problems:  Patient advised of being ready to discharge home today.  He endorsed SI but plan or intent to act of thoughts.  He rated depression at seven and anxiety at six.  Patient to follow up with [facility].  [name] 09/16/2012, 12:15 PM

## 2012-09-16 NOTE — [facility] Suicide Risk Assessment (Signed)
Suicide Risk Assessment  Discharge Assessment     Demographic Factors:  Male and Caucasian  Mental Status Per Nursing Assessment::   On Admission:  Suicidal ideation indicated by patient  Current Mental Status by Physician: Alert and oriented to 4. Denies AH/VH/SI/HI.  Loss Factors: Loss of significant relationship  Historical Factors: Impulsivity  Risk Reduction Factors:   Living with another person, especially a relative, Positive social support and Positive coping skills or problem solving skills  Continued Clinical Symptoms:  Bipolar Disorder:   Mixed State, IMPROVED  Cognitive Features That Contribute To Risk:  Cognitively intact    Suicide Risk:  Minimal: No identifiable suicidal ideation.  Patients presenting with no risk factors but with morbid ruminations; may be classified as minimal risk based on the severity of the depressive symptoms  Discharge Diagnoses:   AXIS I:  Bipolar, mixed AXIS II:  Deferred AXIS III:   Past Medical History  Diagnosis Date  . Stroke     "light" stroke  . Hypothyroidism   . Sleep apnea     CPAP  . Headache   . Arthritis   . Complication of anesthesia     has woken up during surgery before   . Hypertension   . Bipolar affective    AXIS IV:  other psychosocial or environmental problems AXIS V:  61-70 mild symptoms  Plan Of Care/Follow-up recommendations:  Activity:  As tolerated Diet:  low fat, low salt diet Follow up with outpatient appointments.  Is patient on multiple antipsychotic therapies at discharge:  No   Has Patient had three or more failed trials of antipsychotic monotherapy by history:  No  Recommended Plan for Multiple Antipsychotic Therapies: NA Navarre Diana 09/16/2012, 10:06 AM

## 2012-09-16 NOTE — Progress Notes (Signed)
[facility] Group Notes:  (Nursing/MHT/Case Management/Adjunct)  Date:  09/16/2012  Time:  1:53 AM  Type of Therapy:  wrap up group  Participation Level:  Active  Participation Quality:  Appropriate and Sharing  Affect:  Depressed and Irritable  Cognitive:  Appropriate  Insight:  Good  Engagement in Group:  Engaged  Modes of Intervention:  Discussion and Support  Summary of Progress/Problems: When asked what type of day pt was having he stated, "I was having a good day until I saw the Dr".  Writer asked how the Dr caused him to have a bad day and he replied, "The Dr causes me to become more frustrated and my day was going well until I met with her".  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    Tomi Bamberger Coursey 09/16/2012, 1:53 AM

## 2012-09-16 NOTE — Progress Notes (Signed)
Patient ID: Rodney Leach, male   DOB: Jun 21, 1961, 52 y.o.   MRN: [id number] D:  Patient discharged to home.  All belongings retrieved from room.  No belongings in the lockers in the search room.  Patient has been up and in the milieu most of the day.  Attending groups and interacting with peers on the unit.  Rates depression and hopelessness at a 7 today and anxiety at a 5.  States he still has off and on fleeting thoughts of suicide, but states he will not act on thoughts.  A:  Reviewed all discharge instructions, medications, and follow up care.  Allowed patient time to ask questions.  Escorted patient to the front lobby where his wife was waiting to pick him up.   R:  Patient verbalized understanding of all.  States he feels ready to leave the hospital.  Had no further questions.

## 2012-09-16 NOTE — Progress Notes (Signed)
Patient ID: Rodney Leach, male   DOB: 1960/10/17, 52 y.o.   MRN: [id number] D: Pt. Seen in day room watching TV and playing cards. "a little aggravated now, want to go home." A: Writer encouraged client to speak to doctor about discharge plans. Staff will monitor q54min for safety. Staff encouraged group. R: pt. Is safe on the unit. Pt. Went to group.

## 2012-09-16 NOTE — Progress Notes (Signed)
[name] Adult Case Management Discharge Plan :  Will you be returning to the same living situation after discharge: Yes,  Patient returning home with wife. At discharge, do you have transportation home?:Yes,  Wife will transport patient home. Do you have the ability to pay for your medications:Yes,  Patient has Medicaid.  Release of information consent forms completed and in the chart;  Patient's signature needed at discharge.  Patient to Follow up at: Follow-up Information    Follow up with [facility]. On 09/20/2012. (You are scheduled with [facility] on Monday, September 20, 2012 at 11AM)    Contact information:   [address]  [place], [place]     [phone]         Patient denies SI/HI:  No.  Patient endorses SI but no intent to act on thoughts.  Safety Planning and Suicide Prevention discussed:  Yes,  Reviewed during after care groups.  [name] 09/16/2012, 12:13 PM

## 2012-09-17 NOTE — Discharge Summary (Signed)
Physician Discharge Summary Note  Patient:  Rodney Leach is an 52 y.o., male MRN:  [id number] DOB:  [birth year] Patient phone:  [phone] (home)  Patient address:   [address] [place] [address],   Date of Admission:  09/01/2012 Date of Discharge: 09/16/2012  Reason for Admission:  Depression with suicidal thoughts  Discharge Diagnoses: Active Problems:  Depression  Anxiety  Review of Systems  Constitutional: Negative.   HENT: Negative.   Eyes: Negative.   Respiratory: Negative.   Cardiovascular: Negative.   Gastrointestinal: Negative.   Genitourinary: Negative.   Musculoskeletal: Negative.   Skin: Negative.   Neurological: Negative.   Endo/Heme/Allergies: Negative.   Psychiatric/Behavioral: Positive for depression. The patient is nervous/anxious.    Axis Diagnosis:   AXIS I:  Anxiety Disorder NOS and Depressive Disorder NOS AXIS II:  Deferred AXIS III:   Past Medical History  Diagnosis Date  . Stroke     "light" stroke  . Hypothyroidism   . Sleep apnea     CPAP  . Headache   . Arthritis   . Complication of anesthesia     has woken up during surgery before   . Hypertension   . Bipolar affective    AXIS IV:  other psychosocial or environmental problems, problems related to social environment and problems with primary support group AXIS V:  61-70 mild symptoms  Level of Care:  OP  Hospital Course:  Reviewed chart, vital signs, medications, and notes. 1-Admitted for crisis management and stabilization, completed 2-Individual and group therapy attended with participation 3-Medication managed for depression and anxiety to reduce current symptoms to base line and improve the patient's overall level of functioning:  Medications reviewed with the patient and he stated no untoward effects 4-Coping skills for depression and anxiety developed and utilized 5-Addressed health issues--vital signs stable 6-Treatment plan in place to prevent relapse of  depression and anxiety 7-Psychosocial education regarding relapse prevention and self-care 8-Patient denied suicidal/homicidal ideations and auditory/visual hallucinations, follow-up appointments encouraged to attend, outside support groups encouraged and information given, Rx given at discharge, his wife will come and take the patient home and he will follow-up with [facility] on Monday to continue his care, wife and family supportive, patient's sleep improved, suicidal thoughts subsided with only "flashes" but no intent and no attempt in the past, depression and anxiety improved, stable for discharge  Consults:  None  Significant Diagnostic Studies:  labs: Completed and reviewed, stable  Discharge Vitals:   Blood pressure 112/77, pulse 73, temperature 97.5 F (36.4 C), temperature source Oral, resp. rate 20, height 6\' 1"  (1.854 m), weight 146.965 kg (324 lb). Body mass index is 42.75 kg/(m^2). Lab Results:   No results found for this or any previous visit (from the past 72 hour(s)).  Physical Findings: AIMS: Facial and Oral Movements Muscles of Facial Expression: None, normal Lips and Perioral Area: None, normal Jaw: None, normal Tongue: None, normal,Extremity Movements Upper (arms, wrists, hands, fingers): None, normal Lower (legs, knees, ankles, toes): None, normal, Trunk Movements Neck, shoulders, hips: None, normal, Overall Severity Severity of abnormal movements (highest score from questions above): None, normal Incapacitation due to abnormal movements: None, normal Patient's awareness of abnormal movements (rate only patient's report): No Awareness, Dental Status Current problems with teeth and/or dentures?: No Does patient usually wear dentures?: No  CIWA:  CIWA-Ar Total: 1  COWS:  COWS Total Score: 1   Psychiatric Specialty Exam: See Psychiatric Specialty Exam and Suicide Risk Assessment completed by Attending Physician  prior to discharge.  Discharge destination:  Home  Is  patient on multiple antipsychotic therapies at discharge:  No   Has Patient had three or more failed trials of antipsychotic monotherapy by history:  No Recommended Plan for Multiple Antipsychotic Therapies:  N/A  Discharge Orders    Future Orders Please Complete By Expires   Diet - low sodium heart healthy      Activity as tolerated - No restrictions          Medication List     As of 09/17/2012  8:52 AM    STOP taking these medications         levofloxacin 750 MG tablet   Commonly known as: LEVAQUIN      predniSONE 20 MG tablet   Commonly known as: DELTASONE      traZODone 100 MG tablet   Commonly known as: DESYREL      TAKE these medications      Indication    albuterol 108 (90 BASE) MCG/ACT inhaler   Commonly known as: PROVENTIL HFA;VENTOLIN HFA   Inhale 2 puffs into the lungs every 6 (six) hours as needed for wheezing or shortness of breath. For breathing    Indication: Chronic Obstructive Lung Disease      aspirin 81 MG chewable tablet   Chew 1 tablet (81 mg total) by mouth daily.    Indication: prevent thrombosis      atorvastatin 10 MG tablet   Commonly known as: LIPITOR   Take 1 tablet (10 mg total) by mouth daily.    Indication: hyperlipidemia      busPIRone 7.5 MG tablet   Commonly known as: BUSPAR   Take 1 tablet (7.5 mg total) by mouth 3 (three) times daily.    Indication: Anxiety Disorder      divalproex 500 MG 24 hr tablet   Commonly known as: DEPAKOTE ER   Take 1 tablet (500 mg total) by mouth at bedtime.    Indication: mood stability      gabapentin 400 MG capsule   Commonly known as: NEURONTIN   Take 1 capsule (400 mg total) by mouth 4 (four) times daily.    Indication: Agitation, Neuropathic Pain      hydrochlorothiazide 25 MG tablet   Commonly known as: HYDRODIURIL   Take 1 tablet (25 mg total) by mouth daily.    Indication: High Blood Pressure      hydrOXYzine 25 MG tablet   Commonly known as: ATARAX/VISTARIL   Take 1 tablet (25 mg  total) by mouth every 4 (four) hours.    Indication: Anxiety Neurosis      levothyroxine 175 MCG tablet   Commonly known as: SYNTHROID, LEVOTHROID   Take 1 tablet (175 mcg total) by mouth daily.    Indication: Underactive Thyroid      lisinopril 20 MG tablet   Commonly known as: PRINIVIL,ZESTRIL   Take 1 tablet (20 mg total) by mouth 2 (two) times daily.    Indication: High Blood Pressure      QUEtiapine 300 MG 24 hr tablet   Commonly known as: SEROQUEL XR   Take 1 tablet (300 mg total) by mouth daily at 8 pm.    Indication: Major Depressive Disorder      zolpidem 5 MG tablet   Commonly known as: AMBIEN   Take 1 tablet (5 mg total) by mouth at bedtime as needed for sleep.    Indication: Trouble Sleeping  Follow-up Information    Follow up with [facility]. On 09/20/2012. (You are scheduled with [facility] on Monday, September 20, 2012 at 11AM)    Contact information:   [address]  [place], [place]     [phone]         Follow-up recommendations:  Activity as tolerated, low sodium heart healthy diet  Comments:  The patient will continue his care at [facility] on Monday and stay home with his family until then, wife very supportive  Total Discharge Time:  Greater than 30 minutes  Signed: Nanine Means, PMH-NP 09/17/2012, 8:52 AM

## 2012-09-17 NOTE — Discharge Summary (Signed)
Reviewed

## 2012-09-21 NOTE — Progress Notes (Signed)
Patient Discharge Instructions:  After Visit Summary (AVS):   Faxed to:  09/21/12 Discharge Summary Note:   Faxed to:  09/21/12 Psychiatric Admission Assessment Note:   Faxed to:  09/21/12 Suicide Risk Assessment - Discharge Assessment:   Faxed to:  09/21/12 Faxed/Sent to the Next Level Care provider:  09/21/12 Faxed to [facility] @ [phone]  Jerelene Redden, 09/21/2012, 4:12 PM

## 2012-10-04 ENCOUNTER — Encounter (HOSPITAL_COMMUNITY): Payer: Self-pay

## 2012-10-04 ENCOUNTER — Emergency Department (HOSPITAL_COMMUNITY)
Admission: EM | Admit: 2012-10-04 | Discharge: 2012-10-06 | Disposition: A | Discharge status: Other Acute Inpatient Hospital | Payer: Medicaid Other | Attending: Emergency Medicine | Admitting: Emergency Medicine

## 2012-10-04 DIAGNOSIS — Z8739 Personal history of other diseases of the musculoskeletal system and connective tissue: Secondary | ICD-10-CM | POA: Insufficient documentation

## 2012-10-04 DIAGNOSIS — F329 Major depressive disorder, single episode, unspecified: Secondary | ICD-10-CM

## 2012-10-04 DIAGNOSIS — G4730 Sleep apnea, unspecified: Secondary | ICD-10-CM | POA: Insufficient documentation

## 2012-10-04 DIAGNOSIS — Z7982 Long term (current) use of aspirin: Secondary | ICD-10-CM | POA: Insufficient documentation

## 2012-10-04 DIAGNOSIS — Z79899 Other long term (current) drug therapy: Secondary | ICD-10-CM | POA: Insufficient documentation

## 2012-10-04 DIAGNOSIS — F319 Bipolar disorder, unspecified: Secondary | ICD-10-CM | POA: Insufficient documentation

## 2012-10-04 DIAGNOSIS — F17200 Nicotine dependence, unspecified, uncomplicated: Secondary | ICD-10-CM | POA: Insufficient documentation

## 2012-10-04 DIAGNOSIS — R45851 Suicidal ideations: Secondary | ICD-10-CM | POA: Insufficient documentation

## 2012-10-04 DIAGNOSIS — F32A Depression, unspecified: Secondary | ICD-10-CM

## 2012-10-04 DIAGNOSIS — Z8673 Personal history of transient ischemic attack (TIA), and cerebral infarction without residual deficits: Secondary | ICD-10-CM | POA: Insufficient documentation

## 2012-10-04 DIAGNOSIS — I10 Essential (primary) hypertension: Secondary | ICD-10-CM | POA: Insufficient documentation

## 2012-10-04 DIAGNOSIS — Z9981 Dependence on supplemental oxygen: Secondary | ICD-10-CM | POA: Insufficient documentation

## 2012-10-04 DIAGNOSIS — E039 Hypothyroidism, unspecified: Secondary | ICD-10-CM | POA: Insufficient documentation

## 2012-10-04 LAB — COMPREHENSIVE METABOLIC PANEL
ALT: 29 U/L (ref 0–53)
Alkaline Phosphatase: 120 U/L — ABNORMAL HIGH (ref 39–117)
CO2: 24 mEq/L (ref 19–32)
Calcium: 8.7 mg/dL (ref 8.4–10.5)
GFR calc Af Amer: >90 mL/min (ref >90)
GFR calc non Af Amer: >90 mL/min (ref >90)
Glucose, Bld: 80 mg/dL (ref 70–99)
Sodium: 131 mEq/L — ABNORMAL LOW (ref 135–145)
Total Bilirubin: 0.4 mg/dL (ref 0.3–1.2)

## 2012-10-04 LAB — CBC WITH DIFFERENTIAL/PLATELET
Eosinophils Relative: 1 % (ref 0–5)
HCT: 47.1 % (ref 39.0–52.0)
Lymphocytes Relative: 21 % (ref 12–46)
Lymphs Abs: 2 10*3/uL (ref 0.7–4.0)
MCV: 85.5 fL (ref 78.0–100.0)
Platelets: 252 10*3/uL (ref 150–400)
RBC: 5.51 MIL/uL (ref 4.22–5.81)
WBC: 9.7 10*3/uL (ref 4.0–10.5)

## 2012-10-04 LAB — URINALYSIS, ROUTINE W REFLEX MICROSCOPIC
Bilirubin Urine: NEGATIVE
Ketones, ur: NEGATIVE mg/dL
Protein, ur: NEGATIVE mg/dL
Urobilinogen, UA: 0.2 mg/dL (ref 0.0–1.0)

## 2012-10-04 LAB — RAPID URINE DRUG SCREEN, HOSP PERFORMED
Barbiturates: NOT DETECTED
Tetrahydrocannabinol: NOT DETECTED

## 2012-10-04 MED ORDER — DOXEPIN HCL 50 MG PO CAPS
50.0000 mg | ORAL_CAPSULE | Freq: Every day | ORAL | Status: DC
  Administered 2012-10-04 – 2012-10-05 (×2): 50 mg via ORAL
  Filled 2012-10-04 (×4): qty 1

## 2012-10-04 MED ORDER — ALBUTEROL SULFATE HFA 108 (90 BASE) MCG/ACT IN AERS: 2.0000 | INHALATION_SPRAY | Freq: Four times a day (QID) | RESPIRATORY_TRACT | Status: DC | PRN

## 2012-10-04 MED ORDER — LEVOTHYROXINE SODIUM 175 MCG PO TABS
175.0000 ug | ORAL_TABLET | Freq: Every day | ORAL | Status: DC
  Administered 2012-10-05 – 2012-10-06 (×2): 175 ug via ORAL
  Filled 2012-10-04 (×3): qty 1

## 2012-10-04 MED ORDER — QUETIAPINE FUMARATE ER 300 MG PO TB24
300.0000 mg | ORAL_TABLET | Freq: Every day | ORAL | Status: DC
  Administered 2012-10-04 – 2012-10-05 (×2): 300 mg via ORAL
  Filled 2012-10-04 (×3): qty 1

## 2012-10-04 MED ORDER — ASPIRIN 81 MG PO CHEW
81.0000 mg | CHEWABLE_TABLET | Freq: Every day | ORAL | Status: DC
  Administered 2012-10-04 – 2012-10-05 (×2): 81 mg via ORAL
  Filled 2012-10-04: qty 1

## 2012-10-04 MED ORDER — ACETAMINOPHEN 325 MG PO TABS: 650.0000 mg | ORAL_TABLET | ORAL | Status: DC | PRN

## 2012-10-04 MED ORDER — IBUPROFEN 200 MG PO TABS: 600.0000 mg | ORAL_TABLET | Freq: Three times a day (TID) | ORAL | Status: DC | PRN

## 2012-10-04 MED ORDER — LORAZEPAM 1 MG PO TABS
1.0000 mg | ORAL_TABLET | Freq: Three times a day (TID) | ORAL | Status: DC | PRN
  Administered 2012-10-04 – 2012-10-05 (×3): 1 mg via ORAL
  Filled 2012-10-04 (×3): qty 1

## 2012-10-04 MED ORDER — HYDROCHLOROTHIAZIDE 25 MG PO TABS
25.0000 mg | ORAL_TABLET | Freq: Every day | ORAL | Status: DC
  Administered 2012-10-04 – 2012-10-05 (×2): 25 mg via ORAL
  Filled 2012-10-04 (×3): qty 1

## 2012-10-04 MED ORDER — LORAZEPAM 1 MG PO TABS
1.0000 mg | ORAL_TABLET | Freq: Once | ORAL | Status: AC
  Administered 2012-10-04: 1 mg via ORAL
  Filled 2012-10-04: qty 1

## 2012-10-04 MED ORDER — DIVALPROEX SODIUM ER 500 MG PO TB24
500.0000 mg | ORAL_TABLET | Freq: Every day | ORAL | Status: DC
  Filled 2012-10-04: qty 1

## 2012-10-04 MED ORDER — BUSPIRONE HCL 15 MG PO TABS
7.5000 mg | ORAL_TABLET | Freq: Three times a day (TID) | ORAL | Status: DC
  Administered 2012-10-04: 7.5 mg via ORAL
  Filled 2012-10-04 (×3): qty 1

## 2012-10-04 MED ORDER — ATORVASTATIN CALCIUM 10 MG PO TABS
10.0000 mg | ORAL_TABLET | Freq: Every day | ORAL | Status: DC
  Administered 2012-10-04 – 2012-10-05 (×2): 10 mg via ORAL
  Filled 2012-10-04 (×3): qty 1

## 2012-10-04 MED ORDER — HYDROXYZINE HCL 25 MG PO TABS
25.0000 mg | ORAL_TABLET | ORAL | Status: DC
  Administered 2012-10-04: 25 mg via ORAL
  Filled 2012-10-04: qty 1

## 2012-10-04 MED ORDER — ZOLPIDEM TARTRATE 5 MG PO TABS
5.0000 mg | ORAL_TABLET | Freq: Every evening | ORAL | Status: DC | PRN
  Administered 2012-10-04 – 2012-10-05 (×2): 5 mg via ORAL
  Filled 2012-10-04 (×2): qty 1

## 2012-10-04 MED ORDER — LISINOPRIL 20 MG PO TABS
20.0000 mg | ORAL_TABLET | Freq: Two times a day (BID) | ORAL | Status: DC
  Administered 2012-10-04 – 2012-10-05 (×4): 20 mg via ORAL
  Filled 2012-10-04 (×7): qty 1

## 2012-10-04 MED ORDER — GABAPENTIN 400 MG PO CAPS
400.0000 mg | ORAL_CAPSULE | Freq: Four times a day (QID) | ORAL | Status: DC
  Administered 2012-10-04 – 2012-10-05 (×8): 400 mg via ORAL
  Filled 2012-10-04 (×12): qty 1

## 2012-10-04 NOTE — Progress Notes (Addendum)
Pt referred to [facility], declined due to recent admission and needing higher level of care.   Pt referred to [facility], no appropriate beds at this time.   Pt referred to [facility] pending review.  Pt referred to [facility] pending review.  Pt referred to [facility] pending review.  Catha Gosselin, LCSWA  [phone] .10/04/2012 9:50pm

## 2012-10-04 NOTE — ED Provider Notes (Addendum)
History     CSN: [id number]  Arrival date & time 10/04/12  0830   First MD Initiated Contact with Patient 10/04/12 [phone]      Chief Complaint  Patient presents with  . Medical Clearance    (Consider location/radiation/quality/duration/timing/severity/associated sxs/prior treatment) The history is provided by the patient.  Rodney Leach is a 52 y.o. male history bipolar, hypothyroidism, sleep apnea on home CPAP here with suicidal ideations. He was recently admitted for suicidal ideation and discharge from behavior health on February 6. He said the medicines has not helped him in the last week he has not been sleeping well. He also has some suicidal ideations and thought about cutting his wrists and jumping in front of a car. He does not feel safe at home and is constant thoughts of suicide. No suicidal attempt in the past. Has been trying to self medicate with alcohol. Last drink was prior to arrival.    Past Medical History  Diagnosis Date  . Stroke     "light" stroke  . Hypothyroidism   . Sleep apnea     CPAP  . Headache   . Arthritis   . Complication of anesthesia     has woken up during surgery before   . Hypertension   . Bipolar affective     Past Surgical History  Procedure Laterality Date  . Hand surgery      R & L   . Total knee arthroplasty  2012    Right  . Back surgery      fusion L5/S1  . Hernia repair      umbilical  . Nose surgery      fracture repair  . Multiple tooth extractions    . Total knee arthroplasty  04/07/2012    Procedure: TOTAL KNEE ARTHROPLASTY;  Surgeon: Loreta Ave, MD;  Location: [facility] OR;  Service: Orthopedics;  Laterality: Left;  left total knee arthroplasty    History reviewed. No pertinent family history.  History  Substance Use Topics  . Smoking status: Current Every Day Smoker -- 2.00 packs/day    Types: Cigarettes  . Smokeless tobacco: Never Used  . Alcohol Use: Yes     Comment: occasionally      Review of Systems   Psychiatric/Behavioral: Positive for suicidal ideas.  All other systems reviewed and are negative.    Allergies  Sulfa antibiotics  Home Medications   Current Outpatient Rx  Name  Route  Sig  Dispense  Refill  . albuterol (PROVENTIL HFA;VENTOLIN HFA) 108 (90 BASE) MCG/ACT inhaler   Inhalation   Inhale 2 puffs into the lungs every 6 (six) hours as needed for wheezing or shortness of breath. For breathing   1 Inhaler   0   . aspirin 81 MG chewable tablet   Oral   Chew 1 tablet (81 mg total) by mouth daily.   30 tablet   0   . atorvastatin (LIPITOR) 10 MG tablet   Oral   Take 1 tablet (10 mg total) by mouth daily.   30 tablet   0   . busPIRone (BUSPAR) 7.5 MG tablet   Oral   Take 1 tablet (7.5 mg total) by mouth 3 (three) times daily.   90 tablet   0   . divalproex (DEPAKOTE ER) 500 MG 24 hr tablet   Oral   Take 1 tablet (500 mg total) by mouth at bedtime.   30 tablet   0   . gabapentin (NEURONTIN) 400  MG capsule   Oral   Take 1 capsule (400 mg total) by mouth 4 (four) times daily.   120 capsule   0   . hydrochlorothiazide (HYDRODIURIL) 25 MG tablet   Oral   Take 1 tablet (25 mg total) by mouth daily.   30 tablet   0   . hydrOXYzine (ATARAX/VISTARIL) 25 MG tablet   Oral   Take 1 tablet (25 mg total) by mouth every 4 (four) hours.   30 tablet   0   . levothyroxine (SYNTHROID, LEVOTHROID) 175 MCG tablet   Oral   Take 1 tablet (175 mcg total) by mouth daily.   30 tablet   0   . lisinopril (PRINIVIL,ZESTRIL) 20 MG tablet   Oral   Take 1 tablet (20 mg total) by mouth 2 (two) times daily.   60 tablet   0   . QUEtiapine (SEROQUEL XR) 300 MG 24 hr tablet   Oral   Take 1 tablet (300 mg total) by mouth daily at 8 pm.   30 tablet   0   . zolpidem (AMBIEN) 5 MG tablet   Oral   Take 1 tablet (5 mg total) by mouth at bedtime as needed for sleep.   30 tablet   0     BP 149/75  Pulse 92  Temp(Src) 97.9 F (36.6 C) (Oral)  Resp 20  SpO2  96%  Physical Exam  Nursing note and vitals reviewed. Constitutional: He is oriented to person, place, and time.  Depressed, NAD. Not clinically intoxicated   HENT:  Head: Normocephalic.  Mouth/Throat: Oropharynx is clear and moist.  Eyes: Conjunctivae are normal. Pupils are equal, round, and reactive to light.  Neck: Normal range of motion. Neck supple.  Cardiovascular: Normal rate, regular rhythm and normal heart sounds.   Pulmonary/Chest: Effort normal and breath sounds normal. No respiratory distress. He has no wheezes. He has no rales.  Abdominal: Soft. Bowel sounds are normal. He exhibits no distension. There is no tenderness. There is no rebound.  Musculoskeletal: Normal range of motion.  Neurological: He is alert and oriented to person, place, and time.  Skin: Skin is warm and dry.  Psychiatric:  Depressed, + suicidal     ED Course  Procedures (including critical care time)  Labs Reviewed  CBC WITH DIFFERENTIAL - Abnormal; Notable for the following:    RDW 15.7 (*)    All other components within normal limits  COMPREHENSIVE METABOLIC PANEL - Abnormal; Notable for the following:    Sodium 131 (*)    Chloride 94 (*)    Alkaline Phosphatase 120 (*)    All other components within normal limits  ETHANOL - Abnormal; Notable for the following:    Alcohol, Ethyl (B) 91 (*)    All other components within normal limits  URINALYSIS, ROUTINE W REFLEX MICROSCOPIC - Abnormal; Notable for the following:    Hgb urine dipstick TRACE (*)    All other components within normal limits  URINE RAPID DRUG SCREEN (HOSP PERFORMED)  URINE MICROSCOPIC-ADD ON  VALPROIC ACID LEVEL   No results found.   No diagnosis found.    MDM  Rodney Leach is a 52 y.o. male here with suicidal ideations. Will get psych clearance labs. Will likely need admission.   10:44 AM Labs unremarkable. ETOH 91. Telepsych called, will f/u recs.   11:43 AM Telepsych saw patient. Recommend inpatient psych  admission. Appreciate med rec.      Richardean Canal, MD 10/04/12  1045  Richardean Canal, MD 10/04/12 1144

## 2012-10-04 NOTE — ED Notes (Signed)
Patient c/o feeling suicidal and states that he was at [facility] on 08/31/12 and states that he was given new doses of meds and the medications are not working. Patient states that he has been drinking and had his last drink this AM in which he drank 4 beers. Patient states that he feels suicidal and has a plan to cut his wrist or run his vehicle into something. Patient denies HI.

## 2012-10-04 NOTE — ED Notes (Signed)
Pt having auditory hallucinations; hearing a voice telling him to "end it" that he did not deserve to live; pt is very aware of illness and need for care

## 2012-10-04 NOTE — BHH Counselor (Signed)
Pt referred to [facility]; pending review. *Declined by Celso Amy, PA and Verne Spurr, PA due to recent discharge from [facility]. Pt was admitted 1/22-2/6. Recommended higher level of care.   Pt pending alternative placement.
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
## 2012-10-04 NOTE — ED Notes (Signed)
Telepsych complete

## 2012-10-05 MED ORDER — NICOTINE 21 MG/24HR TD PT24
21.0000 mg | MEDICATED_PATCH | Freq: Every day | TRANSDERMAL | Status: DC
  Administered 2012-10-05: 21 mg via TRANSDERMAL
  Filled 2012-10-05: qty 1

## 2012-10-05 NOTE — Progress Notes (Addendum)
Pt has yet to be served IVC papers. CSW called evening magistrate office who directed CSW to call [employer] to follow up regarding day Restaurant manager, fast food. CSW contact [employer] who are Engineer, building services to serve paperwork.   Catha Gosselin, LCSWA  [phone] .10/05/2012 18:00   Pt ivc paperwork was served however not in appropriate pt chart. CSW faxed served ivc paperwork to [place]. CSW left message with Sgt. Paschal for [employer] at [phone]. [employer] had also communicated with the sherrif's department for transportation. CSW updated Earlene Plater, and patient transfer is pending transportation to be arranged. Once csw hears of patient confirmed transformation, csw will inform EDP for discharge.  RN informed patient of delay in transfer to [place] due to pending transportation. Per discussion with Marylu Lund at [facility], patient has authorization to come to [place] until 910 am on 10/06/2012 otherwise patient will have to be reviewed again.    Doree Albee  [phone] 10/05/2012 1759pm   Per RN, sherrif's department unable to transport patient tonight due to previous schedule transports. Per Sgt. Paschal, patient is on the list for first thing in the morning at 7am. . This wrtiter updated Thornton Papas for plan tomorrow. CSW to call and confirm in the am and update [place]. Please contact csw if any questions or concerns.  Doree Albee  [phone] 10/05/2012 .10/05/2012 1923pm    Per Earlene Plater, patient room assignment is 434-A accepting  [place] MD, Pt rn to call in report at [phone].   Marland KitchenCatha Gosselin, Theresia Majors  [phone] .10/05/2012 22:10pm

## 2012-10-05 NOTE — Progress Notes (Signed)
CSW reviewed d/c plans with pt - acceptance to [facility] in [place].  Pt will be transported via [employer]. Explained that a sheriff would come by a little later on to serve the IVC paperwork.  Pt is agreeable to plan of d/c.  Pt requested to contact wife.  Sitter assisted with this request.  CSW will f/u with Earlene Plater once the IVC papers have been served.  Vickii Penna, LCSWA [phone]  Clinical Social Work

## 2012-10-05 NOTE — ED Provider Notes (Signed)
Patient is sleeping this morning. Has been having hallucinations. Telemetry psych recommended inpatient treatment. His been declined at behavioral health. Alternative placement to be found by ACT  Juliet Rude. Rubin Payor, MD 10/05/12 [phone]

## 2012-10-05 NOTE — Progress Notes (Addendum)
11:35am- CSW documented receiving magistrate in IVC log at [employer] station.   11:33am - CSW called magistrate's office to confirm receipt of IVC paperwork.  Receipt confirmed per [place].  11:05am- Pt pending acceptance of [facility] (IVC paperwork needed).  CSW has completed IVC paperwork for EDP, [place].  CSW made appropriate copies and faxed to magistrate.  CSW will f/u re: receipt.  CSW received a call from [facility] who was asking if placement is still needed for pt.  CSW confirmed placement is still needed.  Pt currently under review at [facility].  Vickii Penna, LCSWA [phone]  Clinical Social Work

## 2012-10-06 NOTE — Progress Notes (Addendum)
8:00am- CSW received a call from Sgt. Paschal who stated that transport would be here in 30 minutes.  CSW called [facility] at [phone] and had these plans approved by Amy in the Adult Admissions unit.  Sgt. Paschal requested pt be ready to walk out the door once he got here.  CSW called RN and advised of this request.  CSW also relayed report # (listed above).  Amy at [facility] requested the 1st exam be up to date (as far as time stamp), per RN the 1st exam has not exceeded 24 hours.   7:36am- CSW called Sgt. Paschal (transportation) at [phone] re: transportation.  Received vm.  7:30am - CSW called Sgt. Paschal (transportation) at [phone] re: transportation.  Received vm.  7:20am CSW called Sgt. Paschal (transportation) at [phone] re: transportation for pt.  CSW received vm and left message for return call.  CSW will continue to try to contact Sgt. Paschal to arrange p/u.  Vickii Penna, LCSWA [phone]  Clinical Social Work

## 2012-10-06 NOTE — Progress Notes (Signed)
CSW confirmed for RN that the paperwork that Earlene Plater needs is his IVC paperwork.  RN to fax to [phone].  CSW remains available for assistance.  Vickii Penna, LCSWA [phone]  Clinical Social Work

## 2012-10-06 NOTE — ED Notes (Signed)
Per Rodney Leach pt should be ready to go when sheriff arrives. Pt allowed to wear normal clothing out.

## 2012-10-06 NOTE — ED Notes (Addendum)
Call out to [phone], message left to confirm pickup this morning. Awaiting call back. Voice mail of Sgt. Paschal.  Pt on the service of Dr. Althea Grimmer, rm [id number].  Going to [employer]

## 2012-10-06 NOTE — ED Provider Notes (Signed)
Pt stable awaiting placement  Rodney Leach L Brytney Somes, MD 10/06/12 0819 

## 2012-10-06 NOTE — ED Notes (Signed)
Report called to Amy at [facility].

## 2013-01-14 ENCOUNTER — Emergency Department (HOSPITAL_COMMUNITY): Payer: Medicaid Other

## 2013-01-14 ENCOUNTER — Emergency Department (HOSPITAL_COMMUNITY)
Admission: EM | Admit: 2013-01-14 | Discharge: 2013-01-15 | Disposition: A | Discharge status: Home / Self Care | Payer: Medicaid Other | Attending: Emergency Medicine | Admitting: Emergency Medicine

## 2013-01-14 ENCOUNTER — Encounter (HOSPITAL_COMMUNITY): Payer: Self-pay

## 2013-01-14 DIAGNOSIS — G4730 Sleep apnea, unspecified: Secondary | ICD-10-CM | POA: Insufficient documentation

## 2013-01-14 DIAGNOSIS — R4781 Slurred speech: Secondary | ICD-10-CM

## 2013-01-14 DIAGNOSIS — Z8659 Personal history of other mental and behavioral disorders: Secondary | ICD-10-CM | POA: Insufficient documentation

## 2013-01-14 DIAGNOSIS — Z8739 Personal history of other diseases of the musculoskeletal system and connective tissue: Secondary | ICD-10-CM | POA: Insufficient documentation

## 2013-01-14 DIAGNOSIS — R5381 Other malaise: Secondary | ICD-10-CM | POA: Insufficient documentation

## 2013-01-14 DIAGNOSIS — Z79899 Other long term (current) drug therapy: Secondary | ICD-10-CM | POA: Insufficient documentation

## 2013-01-14 DIAGNOSIS — E039 Hypothyroidism, unspecified: Secondary | ICD-10-CM | POA: Insufficient documentation

## 2013-01-14 DIAGNOSIS — Z7982 Long term (current) use of aspirin: Secondary | ICD-10-CM | POA: Insufficient documentation

## 2013-01-14 DIAGNOSIS — F319 Bipolar disorder, unspecified: Secondary | ICD-10-CM | POA: Insufficient documentation

## 2013-01-14 DIAGNOSIS — R4789 Other speech disturbances: Secondary | ICD-10-CM

## 2013-01-14 DIAGNOSIS — R739 Hyperglycemia, unspecified: Secondary | ICD-10-CM

## 2013-01-14 DIAGNOSIS — F17200 Nicotine dependence, unspecified, uncomplicated: Secondary | ICD-10-CM | POA: Insufficient documentation

## 2013-01-14 DIAGNOSIS — I10 Essential (primary) hypertension: Secondary | ICD-10-CM | POA: Insufficient documentation

## 2013-01-14 DIAGNOSIS — E1169 Type 2 diabetes mellitus with other specified complication: Secondary | ICD-10-CM | POA: Insufficient documentation

## 2013-01-14 DIAGNOSIS — R4182 Altered mental status, unspecified: Secondary | ICD-10-CM

## 2013-01-14 HISTORY — DX: Slurred speech: R47.81

## 2013-01-14 HISTORY — DX: Type 2 diabetes mellitus without complications: E11.9

## 2013-01-14 HISTORY — DX: Post-traumatic stress disorder, unspecified: F43.10

## 2013-01-14 LAB — POCT I-STAT 3, ART BLOOD GAS (G3+)
Bicarbonate: 24.8 mEq/L — ABNORMAL HIGH (ref 20.0–24.0)
O2 Saturation: 91 %
TCO2: 26 mmol/L (ref 0–100)
pCO2 arterial: 41.6 mmHg (ref 35.0–45.0)
pO2, Arterial: 63 mmHg — ABNORMAL LOW (ref 80.0–100.0)

## 2013-01-14 LAB — CBC
HCT: 38.6 % — ABNORMAL LOW (ref 39.0–52.0)
MCV: 76.7 fL — ABNORMAL LOW (ref 78.0–100.0)
Platelets: 199 10*3/uL (ref 150–400)
RBC: 5.03 MIL/uL (ref 4.22–5.81)
WBC: 11.2 10*3/uL — ABNORMAL HIGH (ref 4.0–10.5)

## 2013-01-14 LAB — DIFFERENTIAL
Eosinophils Relative: 3 % (ref 0–5)
Lymphocytes Relative: 24 % (ref 12–46)
Lymphs Abs: 2.7 10*3/uL (ref 0.7–4.0)
Monocytes Absolute: 1.1 10*3/uL — ABNORMAL HIGH (ref 0.1–1.0)

## 2013-01-14 LAB — COMPREHENSIVE METABOLIC PANEL
ALT: 19 U/L (ref 0–53)
CO2: 23 mEq/L (ref 19–32)
Calcium: 8.9 mg/dL (ref 8.4–10.5)
GFR calc Af Amer: >90 mL/min (ref >90)
GFR calc non Af Amer: 83 mL/min — ABNORMAL LOW (ref >90)
Glucose, Bld: 497 mg/dL — ABNORMAL HIGH (ref 70–99)
Sodium: 123 mEq/L — ABNORMAL LOW (ref 135–145)
Total Bilirubin: 0.5 mg/dL (ref 0.3–1.2)

## 2013-01-14 LAB — GLUCOSE, CAPILLARY: Glucose-Capillary: 491 mg/dL — ABNORMAL HIGH (ref 70–99)

## 2013-01-14 MED ORDER — SODIUM CHLORIDE 0.9 % IV BOLUS (SEPSIS)
1000.0000 mL | Freq: Once | INTRAVENOUS | Status: AC
  Administered 2013-01-14: 1000 mL via INTRAVENOUS

## 2013-01-14 NOTE — ED Provider Notes (Signed)
History     CSN: [id number]  Arrival date & time 01/14/13  2119   First MD Initiated Contact with Patient 01/14/13 2125      Chief Complaint  Patient presents with  . Code Stroke    (Consider location/radiation/quality/duration/timing/severity/associated sxs/prior treatment) Patient is a 52 y.o. male presenting with altered mental status.  Altered Mental Status Presenting symptoms: confusion and disorientation   Severity:  Moderate Most recent episode:  Today Episode history:  Single Duration:  12 hours Timing:  Constant Progression:  Worsening Chronicity:  Recurrent Context comment:  Dm, hyperglycemia Associated symptoms: slurred speech and weakness (generalized)   Associated symptoms: no abdominal pain, normal movement, no agitation, no difficulty breathing, no fever, no headaches, no light-headedness, no nausea, no palpitations, no rash and no vomiting     Past Medical History  Diagnosis Date  . Stroke     "light" stroke  . Hypothyroidism   . Sleep apnea     CPAP  . Headache(784.0)   . Arthritis   . Complication of anesthesia     has woken up during surgery before   . Hypertension   . Bipolar affective   . Diabetes mellitus without complication   . PTSD (post-traumatic stress disorder)     Past Surgical History  Procedure Laterality Date  . Hand surgery      R & L   . Total knee arthroplasty  2012    Right  . Back surgery      fusion L5/S1  . Hernia repair      umbilical  . Nose surgery      fracture repair  . Multiple tooth extractions    . Total knee arthroplasty  04/07/2012    Procedure: TOTAL KNEE ARTHROPLASTY;  Surgeon: Loreta Ave, MD;  Location: [facility] OR;  Service: Orthopedics;  Laterality: Left;  left total knee arthroplasty    No family history on file.  History  Substance Use Topics  . Smoking status: Current Every Day Smoker -- 2.00 packs/day    Types: Cigarettes  . Smokeless tobacco: Never Used  . Alcohol Use: Yes     Comment:  occasionally      Review of Systems  Constitutional: Negative for fever and chills.  HENT: Negative for congestion, sore throat and rhinorrhea.   Eyes: Negative for photophobia and visual disturbance.  Respiratory: Negative for cough and shortness of breath.   Cardiovascular: Negative for chest pain, palpitations and leg swelling.  Gastrointestinal: Negative for nausea, vomiting, abdominal pain, diarrhea and constipation.  Endocrine: Negative for polydipsia and polyuria.  Genitourinary: Negative for dysuria and hematuria.  Musculoskeletal: Negative for back pain and arthralgias.  Skin: Negative for color change and rash.  Neurological: Positive for weakness (generalized). Negative for dizziness, syncope, light-headedness and headaches.  Hematological: Negative for adenopathy. Does not bruise/bleed easily.  Psychiatric/Behavioral: Positive for confusion and altered mental status. Negative for agitation.  All other systems reviewed and are negative.    Allergies  Sulfa antibiotics  Home Medications   Current Outpatient Rx  Name  Route  Sig  Dispense  Refill  . albuterol (PROVENTIL HFA;VENTOLIN HFA) 108 (90 BASE) MCG/ACT inhaler   Inhalation   Inhale 2 puffs into the lungs every 6 (six) hours as needed for wheezing or shortness of breath. For breathing   1 Inhaler   0   . aspirin EC 81 MG tablet   Oral   Take 81 mg by mouth daily.         Marland Kitchen  atorvastatin (LIPITOR) 20 MG tablet   Oral   Take 20 mg by mouth daily.         Marland Kitchen buPROPion (WELLBUTRIN) 100 MG tablet   Oral   Take 100 mg by mouth every morning.         . busPIRone (BUSPAR) 10 MG tablet   Oral   Take 20 mg by mouth 4 (four) times daily.         . DULoxetine (CYMBALTA) 60 MG capsule   Oral   Take 120 mg by mouth daily.         Marland Kitchen gabapentin (NEURONTIN) 400 MG capsule   Oral   Take 1 capsule (400 mg total) by mouth 4 (four) times daily.   120 capsule   0   . hydrOXYzine (VISTARIL) 50 MG  capsule   Oral   Take 100 mg by mouth 4 (four) times daily.         Marland Kitchen levothyroxine (SYNTHROID, LEVOTHROID) 175 MCG tablet   Oral   Take 1 tablet (175 mcg total) by mouth daily.   30 tablet   0   . lisinopril-hydrochlorothiazide (PRINZIDE,ZESTORETIC) 20-12.5 MG per tablet   Oral   Take 1 tablet by mouth daily.         . QUEtiapine (SEROQUEL XR) 300 MG 24 hr tablet   Oral   Take 1 tablet (300 mg total) by mouth daily at 8 pm.   30 tablet   0   . traZODone (DESYREL) 100 MG tablet   Oral   Take 200 mg by mouth at bedtime.         Marland Kitchen zolpidem (AMBIEN) 5 MG tablet   Oral   Take 1 tablet (5 mg total) by mouth at bedtime as needed for sleep.   30 tablet   0     BP 115/69  Pulse 80  Temp(Src) 98.4 F (36.9 C) (Oral)  Resp 22  Ht 6\' 5"  (1.956 m)  Wt 331 lb 3 oz (150.226 kg)  BMI 39.27 kg/m2  SpO2 95%  Physical Exam  Vitals reviewed. Constitutional: He is oriented to person, place, and time. He appears well-developed and well-nourished.  HENT:  Head: Normocephalic and atraumatic.  Eyes: Conjunctivae and EOM are normal.  Neck: Normal range of motion. Neck supple.  Cardiovascular: Normal rate, regular rhythm and normal heart sounds.   Pulmonary/Chest: Effort normal and breath sounds normal. No respiratory distress.  Abdominal: He exhibits no distension. There is no tenderness. There is no rebound and no guarding.  Musculoskeletal: Normal range of motion.  Neurological: He is alert and oriented to person, place, and time. GCS eye subscore is 4. GCS verbal subscore is 5. GCS motor subscore is 6.  Skin: Skin is warm and dry.    ED Course  Procedures (including critical care time)  Labs Reviewed  APTT - Abnormal; Notable for the following:    aPTT 23 (*)    All other components within normal limits  CBC - Abnormal; Notable for the following:    WBC 11.2 (*)    HCT 38.6 (*)    MCV 76.7 (*)    All other components within normal limits  DIFFERENTIAL - Abnormal;  Notable for the following:    Monocytes Absolute 1.1 (*)    All other components within normal limits  COMPREHENSIVE METABOLIC PANEL - Abnormal; Notable for the following:    Sodium 123 (*)    Chloride 92 (*)    Glucose, Bld 497 (*)  Albumin 3.4 (*)    Alkaline Phosphatase 126 (*)    GFR calc non Af Amer 83 (*)    All other components within normal limits  URINALYSIS, ROUTINE W REFLEX MICROSCOPIC - Abnormal; Notable for the following:    Specific Gravity, Urine <1.005 (*)    Glucose, UA >1000 (*)    All other components within normal limits  GLUCOSE, CAPILLARY - Abnormal; Notable for the following:    Glucose-Capillary 491 (*)    All other components within normal limits  GLUCOSE, CAPILLARY - Abnormal; Notable for the following:    Glucose-Capillary 421 (*)    All other components within normal limits  POCT I-STAT 3, BLOOD GAS (G3+) - Abnormal; Notable for the following:    pO2, Arterial 63.0 (*)    Bicarbonate 24.8 (*)    All other components within normal limits  PROTIME-INR  TROPONIN I  URINE MICROSCOPIC-ADD ON  POCT I-STAT TROPONIN I   Ct Head (brain) Wo Contrast  01/14/2013   *RADIOLOGY REPORT*  Clinical Data: 52 year old male with generalized weakness - code stroke.  CT HEAD WITHOUT CONTRAST  Technique:  Contiguous axial images were obtained from the base of the skull through the vertex without contrast.  Comparison: 12/06/2004 CT and 11/13/2011 MRI.  Findings: Mild chronic white matter disease and remote lacunar infarct in the right basal ganglia again identified. No acute intracranial abnormalities are identified, including mass lesion or mass effect, hydrocephalus, extra-axial fluid collection, midline shift, hemorrhage, or acute infarction.  The visualized bony calvarium is unremarkable.  IMPRESSION: No evidence of acute intracranial abnormality.  Critical Value/emergent results were called by telephone at the time of interpretation on 01/14/2013 at 9:38 p.m. to Dr. Lynelle Doctor,  who verbally acknowledged these results.   Original Report Authenticated By: Harmon Pier, M.D.   Dg Chest Portable 1 View  01/14/2013   *RADIOLOGY REPORT*  Clinical Data: Code stroke.  History of diabetes.  Bilateral arm and leg numbness.  PORTABLE CHEST - 1 VIEW  Comparison: Chest radiograph and CTA of the chest performed 09/01/2012  Findings: The lungs are mildly hypoexpanded.  Vascular congestion is noted, with chronically increased interstitial markings.  No definite pulmonary edema is seen.  No pleural effusion or pneumothorax is identified.  The heart is borderline normal in size; the mediastinal contour is within normal limits.  No acute osseous abnormalities are seen.  IMPRESSION: Lungs mildly hypoexpanded; vascular congestion noted, without definite pulmonary edema.   Original Report Authenticated By: Tonia Ghent, M.D.     1. Slurred speech   2. Altered mental status   3. Hyperglycemia       MDM  52 y.o. male  with pertinent PMH of DM, bipolar affective do presents with altered mental status beginning this morning and described as confusion and malaise by family.  Pt taken to PCP where he was found to have glucose >500, given insulin, and symptoms began to relieve, however due to concern for slurred speech and AMS, called here for code stroke.  On arrival physical exam without focal deficits, neurology evaluated pt and did not feel his symptoms to be due to CVA, rather due to hyperglycemia, without acute findings on head CT.  Pt alert and oriented with normal exam.  Labs without signs of DKA.  Spoke with pt about possible admission, however he refused.  Feel that this would be in his best interest given clinical scenario and level of hyperglycemia for fluid and monitoring, informed pt of such, however he continued to  refuse.  He voiced understanding of risks, is alert and oriented x3, and agreed to fu. DC AMA .    Labs and imaging as above reviewed by myself and attending,Dr. Lynelle Doctor, with  whom case was discussed.   1. Slurred speech   2. Altered mental status   3. Hyperglycemia             Noel Gerold, MD 01/15/13 [phone]

## 2013-01-14 NOTE — ED Notes (Signed)
Family at bedside. Wife reports that he has been having some changes in recently in medications for bipolar d/o and PTSD.

## 2013-01-14 NOTE — ED Notes (Addendum)
Patient from home as a CODE STROKE via [facility]. Wife noticed that the patient was confused and altered around 2015. According to spouse, patient has been having some generalized weakness for the past week. Was seen by PCP today. CBG in office was "high." Hx DM. Started on Novolog today. Blood sugars continued to be high. Per EMS, blood sugar 509. BP 130/84 HR 86 RR 18 SPO2 98% via [place]. Per EMS, patient has generalized weakness, unsteady gait. Answer questions appropriately. Slow to follow commands. No slurred speech, no unilateral weakness.

## 2013-01-14 NOTE — Consult Note (Signed)
Referring Physician: Lynelle Doctor    Chief Complaint: Weakness  HPI: Rodney Leach is an 52 y.o. male who has a history of DM that has been diet controlled.  Was today noted to have markedly elevated blood sugars and was started on insulin.  Has also been on medications for PTSD and BPD.  Has been having those adjusted and with that has noted lethargy in the evenings.  Has also been noted to be weak and have increased thirstiness for the past week.  Today became acutely confused and speech was slurred.  Physician was called.  Blood sugars were elevated.  Patient did not improve and EMS was called.  Patient was brought in as a code stroke.  Initial NIHSS of 1  Date last known well: Date: 01/14/2013 Time last known well: Time: 20:15 tPA Given: No: Not felt to be a stroke  Past Medical History  Diagnosis Date  . Stroke     "light" stroke  . Hypothyroidism   . Sleep apnea     CPAP  . Headache(784.0)   . Arthritis   . Complication of anesthesia     has woken up during surgery before   . Hypertension   . Bipolar affective   . Diabetes mellitus without complication   . PTSD (post-traumatic stress disorder)     Past Surgical History  Procedure Laterality Date  . Hand surgery      R & L   . Total knee arthroplasty  2012    Right  . Back surgery      fusion L5/S1  . Hernia repair      umbilical  . Nose surgery      fracture repair  . Multiple tooth extractions    . Total knee arthroplasty  04/07/2012    Procedure: TOTAL KNEE ARTHROPLASTY;  Surgeon: Loreta Ave, MD;  Location: [facility] OR;  Service: Orthopedics;  Laterality: Left;  left total knee arthroplasty    Family history:  Mother with BPD.  Father passed away from an ICH.  Has a brother with esophageal cancer.  Social History:  reports that he has been smoking Cigarettes.  He has been smoking about 2.00 packs per day. He has never used smokeless tobacco. He reports that  drinks alcohol. He reports that he does not use illicit  drugs.  Allergies:  Allergies  Allergen Reactions  . Sulfa Antibiotics Rash    Medications: I have reviewed the patient's current medications. Prior to Admission:  Current outpatient prescriptions: albuterol (PROVENTIL HFA;VENTOLIN HFA) 108 (90 BASE) MCG/ACT inhaler, Inhale 2 puffs into the lungs every 6 (six) hours as needed for wheezing or shortness of breath. For breathing, Disp: 1 Inhaler, Rfl: 0;   aspirin 81 MG chewable tablet, Chew 1 tablet (81 mg total) by mouth daily., Disp: 30 tablet, Rfl: 0;  atorvastatin (LIPITOR) 10 MG tablet, Take 1 tablet (10 mg total) by mouth daily., Disp: 30 tablet, Rfl: 0 busPIRone (BUSPAR) 7.5 MG tablet, Take 1 tablet (7.5 mg total) by mouth 3 (three) times daily., Disp: 90 tablet, Rfl: 0;   divalproex (DEPAKOTE ER) 500 MG 24 hr tablet, Take 1 tablet (500 mg total) by mouth at bedtime., Disp: 30 tablet, Rfl: 0;  gabapentin (NEURONTIN) 400 MG capsule, Take 1 capsule (400 mg total) by mouth 4 (four) times daily., Disp: 120 capsule, Rfl: 0 hydrochlorothiazide (HYDRODIURIL) 25 MG tablet, Take 1 tablet (25 mg total) by mouth daily., Disp: 30 tablet, Rfl: 0;   hydrOXYzine (ATARAX/VISTARIL) 25 MG tablet,  Take 1 tablet (25 mg total) by mouth every 4 (four) hours., Disp: 30 tablet, Rfl: 0;   levothyroxine (SYNTHROID, LEVOTHROID) 175 MCG tablet, Take 1 tablet (175 mcg total) by mouth daily., Disp: 30 tablet, Rfl: 0 lisinopril (PRINIVIL,ZESTRIL) 20 MG tablet, Take 1 tablet (20 mg total) by mouth 2 (two) times daily., Disp: 60 tablet, Rfl: 0;   QUEtiapine (SEROQUEL XR) 300 MG 24 hr tablet, Take 1 tablet (300 mg total) by mouth daily at 8 pm., Disp: 30 tablet, Rfl: 0;   zolpidem (AMBIEN) 5 MG tablet, Take 1 tablet (5 mg total) by mouth at bedtime as needed for sleep., Disp: 30 tablet, Rfl: 0  ROS: History obtained from wife  General ROS:  fatigue Psychological ROS: negative for - behavioral disorder, hallucinations, memory difficulties, mood swings or suicidal  ideation Ophthalmic ROS: negative for - blurry vision, double vision, eye pain or loss of vision ENT ROS: negative for - epistaxis, nasal discharge, oral lesions, sore throat, tinnitus or vertigo Allergy and Immunology ROS: negative for - hives or itchy/watery eyes Hematological and Lymphatic ROS: negative for - bleeding problems, bruising or swollen lymph nodes Endocrine ROS:  polydipsia Respiratory ROS: negative for - cough, hemoptysis, shortness of breath or wheezing Cardiovascular ROS: negative for - chest pain, dyspnea on exertion, edema or irregular heartbeat Gastrointestinal ROS: negative for - abdominal pain, diarrhea, hematemesis, nausea/vomiting or stool incontinence Genito-Urinary ROS: negative for - dysuria, hematuria, incontinence or urinary frequency/urgency Musculoskeletal ROS: generalized weakness Neurological ROS: as noted in HPI Dermatological ROS: negative for rash and skin lesion changes  Physical Examination: Blood pressure 115/69, pulse 80, temperature 98.4 F (36.9 C), temperature source Oral, resp. rate 22, height 6\' 5"  (1.956 m), weight 150.226 kg (331 lb 3 oz), SpO2 95.00%.  Neurologic Examination: Mental Status: Alert, oriented, thought content appropriate.  Speech fluent without evidence of aphasia.  Able to follow 3 step commands without difficulty. Cranial Nerves: II: Discs flat bilaterally; Visual fields grossly normal, pupils equal, round, reactive to light and accommodation III,IV, VI: ptosis not present, extra-ocular motions intact bilaterally with nystagmus on upward and left lateral gaze V,VII: smile symmetric, facial light touch sensation normal bilaterally VIII: hearing normal bilaterally IX,X: gag reflex present XI: bilateral shoulder shrug XII: midline tongue extension Motor: Right : Upper extremity   5/5    Left:     Upper extremity   5/5  Lower extremity   5/5     Lower extremity   5/5 Tone and bulk:normal tone throughout; no atrophy  noted Sensory: Pinprick and light touch intact throughout, bilaterally Deep Tendon Reflexes: 2+ in the upper extremities, 1+ at the knees and absent at the ankles.   Plantars: Right: mute   Left: mute Cerebellar: normal finger-to-nose and normal heel-to-shin test Gait: not able to test CV: pulses palpable throughout   Laboratory Studies:  Basic Metabolic Panel: No results found for this basename: NA, K, CL, CO2, GLUCOSE, BUN, CREATININE, CALCIUM, MG, PHOS,  in the last 168 hours  Liver Function Tests: No results found for this basename: AST, ALT, ALKPHOS, BILITOT, PROT, ALBUMIN,  in the last 168 hours No results found for this basename: LIPASE, AMYLASE,  in the last 168 hours No results found for this basename: AMMONIA,  in the last 168 hours  CBC:  Recent Labs Lab 01/14/13 2122  WBC 11.2*  NEUTROABS 7.1  HGB 13.5  HCT 38.6*  MCV 76.7*  PLT 199    Cardiac Enzymes: No results found for this basename: CKTOTAL,  CKMB, CKMBINDEX, TROPONINI,  in the last 168 hours  BNP: No components found with this basename: POCBNP,   CBG:  Recent Labs Lab 01/14/13 2141  GLUCAP 491*    Microbiology: Results for orders placed during the hospital encounter of 03/31/12  SURGICAL PCR SCREEN     Status: None   Collection Time    03/31/12 10:27 AM      Result Value Range Status   MRSA, PCR NEGATIVE  NEGATIVE Final   Staphylococcus aureus NEGATIVE  NEGATIVE Final   Comment:            The Xpert SA Assay (FDA     approved for NASAL specimens     in patients over 5 years of age),     is one component of     a comprehensive surveillance     program.  Test performance has     been validated by [employer] for patients greater     than or equal to 39 year old.     It is not intended     to diagnose infection nor to     guide or monitor treatment.    Coagulation Studies:  Recent Labs  01/14/13 2122  LABPROT 13.1  INR 1.00    Urinalysis: No results found for this  basename: COLORURINE, APPERANCEUR, LABSPEC, PHURINE, GLUCOSEU, HGBUR, BILIRUBINUR, KETONESUR, PROTEINUR, UROBILINOGEN, NITRITE, LEUKOCYTESUR,  in the last 168 hours  Lipid Panel:    Component Value Date/Time   CHOL 164 09/02/2012 0610   TRIG 99 09/02/2012 0610   HDL 54 09/02/2012 0610   CHOLHDL 3.0 09/02/2012 0610   VLDL 20 09/02/2012 0610   LDLCALC 90 09/02/2012 0610    HgbA1C:  Lab Results  Component Value Date   HGBA1C 6.7* 09/02/2012    Urine Drug Screen:     Component Value Date/Time   LABOPIA NONE DETECTED 10/04/2012 0904   COCAINSCRNUR NONE DETECTED 10/04/2012 0904   LABBENZ NONE DETECTED 10/04/2012 0904   AMPHETMU NONE DETECTED 10/04/2012 0904   THCU NONE DETECTED 10/04/2012 0904   LABBARB NONE DETECTED 10/04/2012 0904    Alcohol Level: No results found for this basename: ETH,  in the last 168 hours   Imaging: Ct Head (brain) Wo Contrast  01/14/2013   *RADIOLOGY REPORT*  Clinical Data: 52 year old male with generalized weakness - code stroke.  CT HEAD WITHOUT CONTRAST  Technique:  Contiguous axial images were obtained from the base of the skull through the vertex without contrast.  Comparison: 12/06/2004 CT and 11/13/2011 MRI.  Findings: Mild chronic white matter disease and remote lacunar infarct in the right basal ganglia again identified. No acute intracranial abnormalities are identified, including mass lesion or mass effect, hydrocephalus, extra-axial fluid collection, midline shift, hemorrhage, or acute infarction.  The visualized bony calvarium is unremarkable.  IMPRESSION: No evidence of acute intracranial abnormality.  Critical Value/emergent results were called by telephone at the time of interpretation on 01/14/2013 at 9:38 p.m. to Dr. Lynelle Doctor, who verbally acknowledged these results.   Original Report Authenticated By: Harmon Pier, M.D.    Assessment: 52 y.o. male presenting with generalized weakness and slurred speech.  Blood sugar near 500 on presentation.  Patient also with  a sodium of 123 and mildly elevated wbc count.  There is no focality on examination.  Head CT has been reviewed and shows no acute abnormalities.  Do not suspect an acute infarct at this time.  Symptoms likely metabolic in origin.  Patient  on an aspirin a day.    Stroke Risk Factors - diabetes mellitus and hypertension  Plan: 1. HgbA1c, fasting lipid panel 2. Prophylactic therapy-Continue ASA 3. Blood sugar control. 4. Continue to address metabolic issues.   5.  No further neurologic intervention recommended at this time.    Case discussed with Dr. Cristal Generous, MD [facility] [phone] 01/14/2013, 10:00 PM

## 2013-01-15 LAB — URINALYSIS, ROUTINE W REFLEX MICROSCOPIC
Bilirubin Urine: NEGATIVE
Hgb urine dipstick: NEGATIVE
Ketones, ur: NEGATIVE mg/dL
Protein, ur: NEGATIVE mg/dL
Urobilinogen, UA: 0.2 mg/dL (ref 0.0–1.0)

## 2013-01-15 LAB — URINE MICROSCOPIC-ADD ON

## 2013-01-15 MED ORDER — INSULIN REGULAR BOLUS VIA INFUSION
0.0000 [IU] | Freq: Three times a day (TID) | INTRAVENOUS | Status: DC
  Filled 2013-01-15: qty 10

## 2013-01-15 MED ORDER — SODIUM CHLORIDE 0.9 % IV SOLN: INTRAVENOUS | Status: DC

## 2013-01-15 MED ORDER — SODIUM CHLORIDE 0.9 % IV SOLN
INTRAVENOUS | Status: DC
  Filled 2013-01-15: qty 1

## 2013-01-15 MED ORDER — DEXTROSE-NACL 5-0.45 % IV SOLN: INTRAVENOUS | Status: DC

## 2013-01-15 MED ORDER — DEXTROSE 50 % IV SOLN: 25.0000 mL | INTRAVENOUS | Status: DC | PRN

## 2013-01-15 NOTE — ED Provider Notes (Signed)
I saw and evaluated the patient, reviewed the resident's note and I agree with the findings and plan.   Celene Kras, MD 01/15/13 [phone]

## 2013-01-15 NOTE — Code Documentation (Signed)
52 yo wm brought [place] via [place] [facility] for sudden onset of confusion.  Code stroke called 2108, pt arrival 2119, LKN 2015, EDP exam 2120, stroke team arrival 2110, neurologist arrival 2133, pt arrival in ct 2122, phlebotomist arrival 2111. NIH 1 for dysarthria. Code stroke cancelled 2156

## 2013-01-15 NOTE — ED Provider Notes (Signed)
Pt presented to ed as code stroke due to altered mental status, confusion.  Found to be very hyperglycemic.  Code stroke discontinued.   Physical Exam  BP 116/71  Pulse 75  Temp(Src) 98.4 F (36.9 C) (Oral)  Resp 17  Ht 6\' 5"  (1.956 m)  Wt 331 lb 3 oz (150.226 kg)  BMI 39.27 kg/m2  SpO2 95% APhysical Exam alert no distress, mucous membranes moist.  No focal neuro deficits ED Course  Procedures See resident note MDM Admission was recommended for blood sugar control and further tia.stroke workup.  Pt was not a code stroke candidate.  Pt declined and left ama      Celene Kras, MD 01/15/13 [phone]

## 2014-02-21 ENCOUNTER — Other Ambulatory Visit: Payer: Self-pay | Admitting: Physical Medicine and Rehabilitation

## 2014-02-21 DIAGNOSIS — M79605 Pain in left leg: Secondary | ICD-10-CM

## 2014-03-01 ENCOUNTER — Other Ambulatory Visit: Payer: Self-pay

## 2014-03-06 ENCOUNTER — Emergency Department (HOSPITAL_COMMUNITY)
Admission: EM | Admit: 2014-03-06 | Discharge: 2014-03-06 | Disposition: A | Discharge status: Home / Self Care | Payer: BC Managed Care – PPO | Attending: Emergency Medicine | Admitting: Emergency Medicine

## 2014-03-06 ENCOUNTER — Encounter (HOSPITAL_COMMUNITY): Payer: Self-pay | Admitting: Emergency Medicine

## 2014-03-06 ENCOUNTER — Emergency Department (HOSPITAL_COMMUNITY): Payer: BC Managed Care – PPO

## 2014-03-06 DIAGNOSIS — E119 Type 2 diabetes mellitus without complications: Secondary | ICD-10-CM | POA: Insufficient documentation

## 2014-03-06 DIAGNOSIS — W19XXXA Unspecified fall, initial encounter: Secondary | ICD-10-CM

## 2014-03-06 DIAGNOSIS — S99919A Unspecified injury of unspecified ankle, initial encounter: Secondary | ICD-10-CM | POA: Insufficient documentation

## 2014-03-06 DIAGNOSIS — S99929A Unspecified injury of unspecified foot, initial encounter: Secondary | ICD-10-CM

## 2014-03-06 DIAGNOSIS — Z9981 Dependence on supplemental oxygen: Secondary | ICD-10-CM | POA: Insufficient documentation

## 2014-03-06 DIAGNOSIS — S298XXA Other specified injuries of thorax, initial encounter: Secondary | ICD-10-CM | POA: Insufficient documentation

## 2014-03-06 DIAGNOSIS — M545 Low back pain: Secondary | ICD-10-CM

## 2014-03-06 DIAGNOSIS — Z8673 Personal history of transient ischemic attack (TIA), and cerebral infarction without residual deficits: Secondary | ICD-10-CM | POA: Insufficient documentation

## 2014-03-06 DIAGNOSIS — W1809XA Striking against other object with subsequent fall, initial encounter: Secondary | ICD-10-CM | POA: Insufficient documentation

## 2014-03-06 DIAGNOSIS — S0990XA Unspecified injury of head, initial encounter: Secondary | ICD-10-CM | POA: Insufficient documentation

## 2014-03-06 DIAGNOSIS — I10 Essential (primary) hypertension: Secondary | ICD-10-CM | POA: Insufficient documentation

## 2014-03-06 DIAGNOSIS — G4730 Sleep apnea, unspecified: Secondary | ICD-10-CM | POA: Insufficient documentation

## 2014-03-06 DIAGNOSIS — F17200 Nicotine dependence, unspecified, uncomplicated: Secondary | ICD-10-CM | POA: Insufficient documentation

## 2014-03-06 DIAGNOSIS — S8990XA Unspecified injury of unspecified lower leg, initial encounter: Secondary | ICD-10-CM | POA: Insufficient documentation

## 2014-03-06 DIAGNOSIS — F319 Bipolar disorder, unspecified: Secondary | ICD-10-CM | POA: Insufficient documentation

## 2014-03-06 DIAGNOSIS — Z79899 Other long term (current) drug therapy: Secondary | ICD-10-CM | POA: Insufficient documentation

## 2014-03-06 DIAGNOSIS — IMO0002 Reserved for concepts with insufficient information to code with codable children: Secondary | ICD-10-CM | POA: Insufficient documentation

## 2014-03-06 DIAGNOSIS — M129 Arthropathy, unspecified: Secondary | ICD-10-CM | POA: Insufficient documentation

## 2014-03-06 DIAGNOSIS — Z7982 Long term (current) use of aspirin: Secondary | ICD-10-CM | POA: Insufficient documentation

## 2014-03-06 DIAGNOSIS — Y929 Unspecified place or not applicable: Secondary | ICD-10-CM | POA: Insufficient documentation

## 2014-03-06 DIAGNOSIS — E039 Hypothyroidism, unspecified: Secondary | ICD-10-CM | POA: Insufficient documentation

## 2014-03-06 DIAGNOSIS — Y9389 Activity, other specified: Secondary | ICD-10-CM | POA: Insufficient documentation

## 2014-03-06 DIAGNOSIS — R0789 Other chest pain: Secondary | ICD-10-CM

## 2014-03-06 MED ORDER — HYDROCODONE-ACETAMINOPHEN 5-325 MG PO TABS: 1.0000 | ORAL_TABLET | ORAL | Status: DC | PRN

## 2014-03-06 MED ORDER — HYDROCODONE-ACETAMINOPHEN 5-325 MG PO TABS
2.0000 | ORAL_TABLET | Freq: Once | ORAL | Status: AC
  Administered 2014-03-06: 2 via ORAL
  Filled 2014-03-06: qty 2

## 2014-03-06 MED ORDER — KETOROLAC TROMETHAMINE 60 MG/2ML IM SOLN
60.0000 mg | Freq: Once | INTRAMUSCULAR | Status: AC
  Administered 2014-03-06: 60 mg via INTRAMUSCULAR
  Filled 2014-03-06: qty 2

## 2014-03-06 NOTE — ED Provider Notes (Signed)
CSN: [id number]     Arrival date & time 03/06/14  0813 History   First MD Initiated Contact with Patient 03/06/14 [phone]     Chief Complaint  Patient presents with  . Fall  . Chest Pain  . Tailbone Pain  . Knee Pain     (Consider location/radiation/quality/duration/timing/severity/associated sxs/prior Treatment) The history is provided by the patient.    Patient presents with left rib and tailbone pain after fall 4 days ago.  Pt states he has had two fall recently.  1.5 weeks ago he was confused about his medications and was taking extera seroquel and a new medication thiothixene, and was also drinking ETOH to attempt to control his racing thoughts.  At that time he hit his head on the ladder of his [employer] and fell.  Denies LOC.  He has since met with his psychiatrist and is only taking medications as directed, no longer drinking ETOH.  4 days ago he tripped on his shoelace and fall, hitting his left ribs on a counter and then landing directly on his tailbone.  He has since had tingling in his bilateral thighs.  Denies LOC.  Denies hitting head.  Pain in his left ribs is constant, moderate to severe, worse with coughing, sneezing, better with rest.  Pain in tailbone is constant, worse with sitting down, getting up from a seated position, coughing/sneezing.  Denies fevers, lightheadedness/dizziness, chest pain, cough, abdominal pain.  Notes chronic SOB from being overweight and a smoker, this is unchanged from his baseline.  Notes that he was previously on Vicodin 10-325 for his low back pain (s/p lumbar surgery 6 years ago) and this worked well for his pain.   Past Medical History  Diagnosis Date  . Stroke     "light" stroke  . Hypothyroidism   . Sleep apnea     CPAP  . Headache(784.0)   . Arthritis   . Complication of anesthesia     has woken up during surgery before   . Hypertension   . Bipolar affective   . Diabetes mellitus without complication   . PTSD (post-traumatic stress disorder)     Past Surgical History  Procedure Laterality Date  . Hand surgery      R & L   . Total knee arthroplasty  2012    Right  . Back surgery      fusion L5/S1  . Hernia repair      umbilical  . Nose surgery      fracture repair  . Multiple tooth extractions    . Total knee arthroplasty  04/07/2012    Procedure: TOTAL KNEE ARTHROPLASTY;  Surgeon: Ninetta Lights, MD;  Location: [facility];  Service: Orthopedics;  Laterality: Left;  left total knee arthroplasty   No family history on file. History  Substance Use Topics  . Smoking status: Current Every Day Smoker -- 2.00 packs/day    Types: Cigarettes  . Smokeless tobacco: Never Used  . Alcohol Use: No     Comment: occasionally    Review of Systems  All other systems reviewed and are negative.     Allergies  Sulfa antibiotics  Home Medications   Prior to Admission medications   Medication Sig Start Date End Date Taking? Authorizing Provider  albuterol (PROVENTIL HFA;VENTOLIN HFA) 108 (90 BASE) MCG/ACT inhaler Inhale 2 puffs into the lungs every 6 (six) hours as needed for wheezing or shortness of breath. For breathing 09/16/12   Waylan Boga, NP  aspirin EC 81  MG tablet Take 81 mg by mouth daily.    Historical Provider, MD  atorvastatin (LIPITOR) 20 MG tablet Take 20 mg by mouth daily.    Historical Provider, MD  buPROPion (WELLBUTRIN) 100 MG tablet Take 100 mg by mouth every morning.    Historical Provider, MD  busPIRone (BUSPAR) 10 MG tablet Take 20 mg by mouth 4 (four) times daily.    Historical Provider, MD  DULoxetine (CYMBALTA) 60 MG capsule Take 120 mg by mouth daily.    Historical Provider, MD  gabapentin (NEURONTIN) 400 MG capsule Take 1 capsule (400 mg total) by mouth 4 (four) times daily. 09/16/12   Waylan Boga, NP  hydrOXYzine (VISTARIL) 50 MG capsule Take 100 mg by mouth 4 (four) times daily.    Historical Provider, MD  levothyroxine (SYNTHROID, LEVOTHROID) 175 MCG tablet Take 1 tablet (175 mcg total) by mouth daily.  09/16/12   Waylan Boga, NP  lisinopril-hydrochlorothiazide (PRINZIDE,ZESTORETIC) 20-12.5 MG per tablet Take 1 tablet by mouth daily.    Historical Provider, MD  QUEtiapine (SEROQUEL XR) 300 MG 24 hr tablet Take 1 tablet (300 mg total) by mouth daily at 8 pm. 09/16/12   Waylan Boga, NP  traZODone (DESYREL) 100 MG tablet Take 200 mg by mouth at bedtime.    Historical Provider, MD  zolpidem (AMBIEN) 5 MG tablet Take 1 tablet (5 mg total) by mouth at bedtime as needed for sleep. 09/16/12   Waylan Boga, NP   BP 103/74  Pulse 81  Temp(Src) 97.3 F (36.3 C) (Oral)  SpO2 100% Physical Exam  Nursing note and vitals reviewed. Constitutional: He appears well-developed and well-nourished. No distress.  HENT:  Head: Normocephalic and atraumatic.  Neck: Neck supple.  Cardiovascular: Normal rate and regular rhythm.   Pulmonary/Chest: Effort normal and breath sounds normal. No respiratory distress. He has no wheezes. He has no rales. He exhibits tenderness (left lower chest wall tenderness).  Abdominal: Soft. He exhibits no distension and no mass. There is generalized tenderness. There is no rebound and no guarding.  Musculoskeletal:       Back:  Neurological: He is alert. He has normal strength. No cranial nerve deficit or sensory deficit. He exhibits normal muscle tone. Coordination and gait normal. GCS eye subscore is 4. GCS verbal subscore is 5. GCS motor subscore is 6.  CN II-XII intact, EOMs intact, no pronator drift, grip strengths equal bilaterally; strength 5/5 in all extremities, sensation intact in all extremities; finger to nose, heel to shin, rapid alternating movements normal; gait is normal.     Skin: He is not diaphoretic.    ED Course  Procedures (including critical care time) Labs Review Labs Reviewed - No data to display  Imaging Review Dg Ribs Unilateral W/chest Left  03/06/2014   CLINICAL DATA:  [place] 3 days ago, injuring the left ribs. Prior history of stroke.  EXAM: LEFT RIBS  AND CHEST - 3+ VIEW  COMPARISON:  Portable chest x-ray 01/14/2013, 09/01/2012. Two-view chest x-ray 03/31/2012. CTA chest 09/01/2012.  FINDINGS: Site of maximum pain and tenderness marked with a small metallic BB. No left rib fracture identified. No intrinsic osseous abnormalities involving the left ribs.  Cardiomediastinal silhouette unremarkable, unchanged. Mildly prominent bronchovascular markings diffusely and mild central peribronchial thickening, unchanged. Minimal linear scarring at the left lung base, unchanged. Lungs otherwise clear. No pneumothorax. No visible pleural effusions.  IMPRESSION: 1. No left rib fracture identified. 2. Stable chronic bronchitis and/or asthma and minimal left basilar scarring. No acute cardiopulmonary disease.  Electronically Signed   By: Evangeline Dakin M.D.   On: 03/06/2014 09:56   Dg Lumbar Spine Complete  03/06/2014   CLINICAL DATA:  Status post fall, pain  EXAM: LUMBAR SPINE - COMPLETE 4+ VIEW  COMPARISON:  04/16/2012-CT abdomen/pelvis  FINDINGS: There are 5 nonrib bearing lumbar-type vertebral bodies. The vertebral body heights are maintained. The alignment is anatomic. There is no spondylolysis. There is no acute fracture or static listhesis. Posterior lumbar interbody fusion at L5-S1 without failure or complication. Mild endplate osteophytosis throughout the lumbar spine.  The SI joints are unremarkable.  IMPRESSION: No acute osseous injury of the lumbar spine.   Electronically Signed   By: Kathreen Devoid   On: 03/06/2014 09:50   Dg Sacrum/coccyx  03/06/2014   CLINICAL DATA:  Sacrococcygeal pain status post fall.  EXAM: SACRUM AND COCCYX - 2+ VIEW  COMPARISON:  Abdominal series of April 16, 2012  FINDINGS: The patient has undergone previous fusion at L5-S1. Where visualized the hardware is intact. The sacrum and SI joints exhibit no acute abnormalities. No acute coccygeal fracture is demonstrated  IMPRESSION: No acute bony abnormality of the sacrum is  demonstrated.   Electronically Signed   By: David  Martinique   On: 03/06/2014 09:53     EKG Interpretation None      MDM   Final diagnoses:  Fall, initial encounter  Chest wall pain  Midline low back pain, with sciatica presence unspecified    Afebrile, nontoxic patient with l;eft rib and lower back pain x 4 days after mechanical fall. No red flags.  Lungs CTAB.  Neurovascularly intact.  Pt did have an issue with psych medications and alcohol that may have been related to another fall 1-2 weeks ago - per patient he is now taking medications appropriately and is not drinking.  He did hit his head at the time but denies LOC.  No neurologic deficits.  Denies balance or gait difficulties or dizziness.  Vicodin given for pain.  Left ribs and CXR, lumbar/sacral/coccyx xrays negative.  D/C home with norco, PCP follow up.  Discussed result, findings, treatment, and follow up  with patient.  Pt given return precautions.  Pt verbalizes understanding and agrees with plan.        Clayton Bibles, PA-C 03/06/14 1200

## 2014-03-06 NOTE — ED Provider Notes (Signed)
Medical screening examination/treatment/procedure(s) were performed by non-physician practitioner and as supervising physician I was immediately available for consultation/collaboration.   EKG Interpretation None       Babette Relic, MD 03/06/14 2227

## 2014-03-06 NOTE — ED Notes (Signed)
Pt moved to POD A-2. Report given to Olivia Mackie, [place].

## 2014-03-06 NOTE — Discharge Instructions (Signed)
Read the information below.  Use the prescribed medication as directed.  Please discuss all new medications with your pharmacist.  Do not take additional tylenol while taking the prescribed pain medication to avoid overdose.  You may return to the Emergency Department at any time for worsening condition or any new symptoms that concern you.   If you develop fevers, loss of control of bowel or bladder, weakness or numbness in your legs, or are unable to walk, return to the ER for a recheck.   You have been diagnosed by your caregiver as having chest wall pain. SEEK IMMEDIATE MEDICAL ATTENTION IF: You develop a fever.  Your chest pains become severe or intolerable.  You develop new, unexplained symptoms (problems).  You develop shortness of breath, nausea, vomiting, sweating or feel light headed.  You develop a new cough or you cough up blood.   Back Pain, Adult Low back pain is very common. About 1 in 5 people have back pain.The cause of low back pain is rarely dangerous. The pain often gets better over time.About half of people with a sudden onset of back pain feel better in just 2 weeks. About 8 in 10 people feel better by 6 weeks.  CAUSES Some common causes of back pain include:  Strain of the muscles or ligaments supporting the spine.  Wear and tear (degeneration) of the spinal discs.  Arthritis.  Direct injury to the back. DIAGNOSIS Most of the time, the direct cause of low back pain is not known.However, back pain can be treated effectively even when the exact cause of the pain is unknown.Answering your caregiver's questions about your overall health and symptoms is one of the most accurate ways to make sure the cause of your pain is not dangerous. If your caregiver needs more information, he or she may order lab work or imaging tests (X-rays or MRIs).However, even if imaging tests show changes in your back, this usually does not require surgery. HOME CARE INSTRUCTIONS For many  people, back pain returns.Since low back pain is rarely dangerous, it is often a condition that people can learn to [facility] their own.   Remain active. It is stressful on the back to sit or stand in one place. Do not sit, drive, or stand in one place for more than 30 minutes at a time. Take short walks on level surfaces as soon as pain allows.Try to increase the length of time you walk each day.  Do not stay in bed.Resting more than 1 or 2 days can delay your recovery.  Do not avoid exercise or work.Your body is made to move.It is not dangerous to be active, even though your back may hurt.Your back will likely heal faster if you return to being active before your pain is gone.  Pay attention to your body when you bend and lift. Many people have less discomfortwhen lifting if they bend their knees, keep the load close to their bodies,and avoid twisting. Often, the most comfortable positions are those that put less stress on your recovering back.  Find a comfortable position to sleep. Use a firm mattress and lie on your side with your knees slightly bent. If you lie on your back, put a pillow under your knees.  Only take over-the-counter or prescription medicines as directed by your caregiver. Over-the-counter medicines to reduce pain and inflammation are often the most helpful.Your caregiver may prescribe muscle relaxant drugs.These medicines help dull your pain so you can more quickly return to your normal  activities and healthy exercise.  Put ice on the injured area.  Put ice in a plastic bag.  Place a towel between your skin and the bag.  Leave the ice on for 15-20 minutes, 03-04 times a day for the first 2 to 3 days. After that, ice and heat may be alternated to reduce pain and spasms.  Ask your caregiver about trying back exercises and gentle massage. This may be of some benefit.  Avoid feeling anxious or stressed.Stress increases muscle tension and can worsen back pain.It  is important to recognize when you are anxious or stressed and learn ways to manage it.Exercise is a great option. SEEK MEDICAL CARE IF:  You have pain that is not relieved with rest or medicine.  You have pain that does not improve in 1 week.  You have new symptoms.  You are generally not feeling well. SEEK IMMEDIATE MEDICAL CARE IF:   You have pain that radiates from your back into your legs.  You develop new bowel or bladder control problems.  You have unusual weakness or numbness in your arms or legs.  You develop nausea or vomiting.  You develop abdominal pain.  You feel faint. Document Released: 07/28/2005 Document Revised: 01/27/2012 Document Reviewed: 11/29/2013 [facility] Patient Information 2015 [place], [place]. This information is not intended to replace advice given to you by your health care provider. Make sure you discuss any questions you have with your health care provider.  Chest Wall Pain Chest wall pain is pain in or around the bones and muscles of your chest. It may take up to 6 weeks to get better. It may take longer if you must stay physically active in your work and activities.  CAUSES  Chest wall pain may happen on its own. However, it may be caused by:  A viral illness like the flu.  Injury.  Coughing.  Exercise.  Arthritis.  Fibromyalgia.  Shingles. HOME CARE INSTRUCTIONS   Avoid overtiring physical activity. Try not to strain or perform activities that cause pain. This includes any activities using your chest or your abdominal and side muscles, especially if heavy weights are used.  Put ice on the sore area.  Put ice in a plastic bag.  Place a towel between your skin and the bag.  Leave the ice on for 15-20 minutes per hour while awake for the first 2 days.  Only take over-the-counter or prescription medicines for pain, discomfort, or fever as directed by your caregiver. SEEK IMMEDIATE MEDICAL CARE IF:   Your pain increases, or you  are very uncomfortable.  You have a fever.  Your chest pain becomes worse.  You have new, unexplained symptoms.  You have nausea or vomiting.  You feel sweaty or lightheaded.  You have a cough with phlegm (sputum), or you cough up blood. MAKE SURE YOU:   Understand these instructions.  Will watch your condition.  Will get help right away if you are not doing well or get worse. Document Released: 07/28/2005 Document Revised: 10/20/2011 Document Reviewed: 03/24/2011 [facility] Patient Information 2015 [place], [place]. This information is not intended to replace advice given to you by your health care provider. Make sure you discuss any questions you have with your health care provider.

## 2014-03-06 NOTE — ED Notes (Signed)
Pt reports he has been falling frequently over the past 2 weeks. He thinks this may be related to his medicines. States he hit his head on a ladder 2 weeks ago then, a few minutes later, fell to the floor. States he fell 2 days ago, injuring his left rib area and sacrum. Pt is on multiple meds for bipolar disease. PCP is [facility] in [place]. Pt has scabbed abrasions on left knee.

## 2014-03-10 ENCOUNTER — Ambulatory Visit
Admission: RE | Admit: 2014-03-10 | Discharge: 2014-03-10 | Disposition: A | Discharge status: Home / Self Care | Payer: BC Managed Care – PPO | Source: Ambulatory Visit | Attending: Physical Medicine and Rehabilitation | Admitting: Physical Medicine and Rehabilitation

## 2014-03-10 DIAGNOSIS — M79605 Pain in left leg: Secondary | ICD-10-CM

## 2014-03-10 MED ORDER — GADOBENATE DIMEGLUMINE 529 MG/ML IV SOLN
20.0000 mL | Freq: Once | INTRAVENOUS | Status: AC | PRN
  Administered 2014-03-10: 20 mL via INTRAVENOUS

## 2014-05-24 ENCOUNTER — Other Ambulatory Visit: Payer: Self-pay | Admitting: Orthopedic Surgery

## 2014-05-24 DIAGNOSIS — M48061 Spinal stenosis, lumbar region without neurogenic claudication: Secondary | ICD-10-CM

## 2014-05-29 ENCOUNTER — Ambulatory Visit
Admission: RE | Admit: 2014-05-29 | Discharge: 2014-05-29 | Disposition: A | Discharge status: Home / Self Care | Payer: BC Managed Care – PPO | Source: Ambulatory Visit | Attending: Orthopedic Surgery | Admitting: Orthopedic Surgery

## 2014-05-29 DIAGNOSIS — M48061 Spinal stenosis, lumbar region without neurogenic claudication: Secondary | ICD-10-CM

## 2016-03-20 DIAGNOSIS — Z1389 Encounter for screening for other disorder: Secondary | ICD-10-CM | POA: Diagnosis not present

## 2016-03-20 DIAGNOSIS — M62838 Other muscle spasm: Secondary | ICD-10-CM | POA: Diagnosis not present

## 2016-03-20 DIAGNOSIS — E039 Hypothyroidism, unspecified: Secondary | ICD-10-CM | POA: Diagnosis not present

## 2016-03-20 DIAGNOSIS — E669 Obesity, unspecified: Secondary | ICD-10-CM | POA: Diagnosis not present

## 2016-03-20 DIAGNOSIS — E119 Type 2 diabetes mellitus without complications: Secondary | ICD-10-CM | POA: Diagnosis not present

## 2016-03-20 DIAGNOSIS — I10 Essential (primary) hypertension: Secondary | ICD-10-CM | POA: Diagnosis not present

## 2016-03-20 DIAGNOSIS — M5416 Radiculopathy, lumbar region: Secondary | ICD-10-CM | POA: Diagnosis not present

## 2016-03-20 DIAGNOSIS — Z125 Encounter for screening for malignant neoplasm of prostate: Secondary | ICD-10-CM | POA: Diagnosis not present

## 2016-03-20 DIAGNOSIS — Z79899 Other long term (current) drug therapy: Secondary | ICD-10-CM | POA: Diagnosis not present

## 2016-03-23 DIAGNOSIS — L03119 Cellulitis of unspecified part of limb: Secondary | ICD-10-CM | POA: Diagnosis not present

## 2016-03-23 DIAGNOSIS — G629 Polyneuropathy, unspecified: Secondary | ICD-10-CM | POA: Diagnosis not present

## 2016-03-23 DIAGNOSIS — G8929 Other chronic pain: Secondary | ICD-10-CM | POA: Diagnosis not present

## 2016-03-23 DIAGNOSIS — M549 Dorsalgia, unspecified: Secondary | ICD-10-CM | POA: Diagnosis not present

## 2016-03-23 DIAGNOSIS — F17210 Nicotine dependence, cigarettes, uncomplicated: Secondary | ICD-10-CM | POA: Diagnosis not present

## 2016-03-23 DIAGNOSIS — L03116 Cellulitis of left lower limb: Secondary | ICD-10-CM | POA: Diagnosis not present

## 2016-03-23 DIAGNOSIS — S81852A Open bite, left lower leg, initial encounter: Secondary | ICD-10-CM | POA: Diagnosis not present

## 2016-03-23 DIAGNOSIS — L03115 Cellulitis of right lower limb: Secondary | ICD-10-CM | POA: Diagnosis not present

## 2016-03-25 DIAGNOSIS — L089 Local infection of the skin and subcutaneous tissue, unspecified: Secondary | ICD-10-CM | POA: Diagnosis not present

## 2016-03-25 DIAGNOSIS — Z6838 Body mass index (BMI) 38.0-38.9, adult: Secondary | ICD-10-CM | POA: Diagnosis not present

## 2016-03-27 DIAGNOSIS — F319 Bipolar disorder, unspecified: Secondary | ICD-10-CM | POA: Diagnosis not present

## 2016-04-03 DIAGNOSIS — M5416 Radiculopathy, lumbar region: Secondary | ICD-10-CM | POA: Diagnosis not present

## 2016-04-03 DIAGNOSIS — Z720 Tobacco use: Secondary | ICD-10-CM | POA: Diagnosis not present

## 2016-04-03 DIAGNOSIS — Z6835 Body mass index (BMI) 35.0-35.9, adult: Secondary | ICD-10-CM | POA: Diagnosis not present

## 2016-04-03 DIAGNOSIS — L97902 Non-pressure chronic ulcer of unspecified part of unspecified lower leg with fat layer exposed: Secondary | ICD-10-CM | POA: Diagnosis not present

## 2016-04-04 DIAGNOSIS — L88 Pyoderma gangrenosum: Secondary | ICD-10-CM | POA: Diagnosis not present

## 2016-04-04 DIAGNOSIS — E119 Type 2 diabetes mellitus without complications: Secondary | ICD-10-CM | POA: Diagnosis not present

## 2016-04-04 DIAGNOSIS — S71102A Unspecified open wound, left thigh, initial encounter: Secondary | ICD-10-CM | POA: Diagnosis not present

## 2016-04-04 DIAGNOSIS — S71101A Unspecified open wound, right thigh, initial encounter: Secondary | ICD-10-CM | POA: Diagnosis not present

## 2016-04-04 DIAGNOSIS — L02415 Cutaneous abscess of right lower limb: Secondary | ICD-10-CM | POA: Diagnosis not present

## 2016-04-04 DIAGNOSIS — E1142 Type 2 diabetes mellitus with diabetic polyneuropathy: Secondary | ICD-10-CM | POA: Diagnosis not present

## 2016-04-04 DIAGNOSIS — L02416 Cutaneous abscess of left lower limb: Secondary | ICD-10-CM | POA: Diagnosis not present

## 2016-04-04 DIAGNOSIS — L089 Local infection of the skin and subcutaneous tissue, unspecified: Secondary | ICD-10-CM | POA: Diagnosis not present

## 2016-04-07 DIAGNOSIS — L97821 Non-pressure chronic ulcer of other part of left lower leg limited to breakdown of skin: Secondary | ICD-10-CM | POA: Diagnosis not present

## 2016-04-07 DIAGNOSIS — L88 Pyoderma gangrenosum: Secondary | ICD-10-CM | POA: Diagnosis not present

## 2016-04-09 DIAGNOSIS — L88 Pyoderma gangrenosum: Secondary | ICD-10-CM | POA: Diagnosis not present

## 2016-04-11 DIAGNOSIS — L88 Pyoderma gangrenosum: Secondary | ICD-10-CM | POA: Diagnosis not present

## 2016-04-15 DIAGNOSIS — Z79899 Other long term (current) drug therapy: Secondary | ICD-10-CM | POA: Diagnosis not present

## 2016-04-15 DIAGNOSIS — M4806 Spinal stenosis, lumbar region: Secondary | ICD-10-CM | POA: Diagnosis not present

## 2016-04-15 DIAGNOSIS — L88 Pyoderma gangrenosum: Secondary | ICD-10-CM | POA: Diagnosis not present

## 2016-04-15 DIAGNOSIS — G894 Chronic pain syndrome: Secondary | ICD-10-CM | POA: Diagnosis not present

## 2016-04-17 DIAGNOSIS — L88 Pyoderma gangrenosum: Secondary | ICD-10-CM | POA: Diagnosis not present

## 2016-04-21 DIAGNOSIS — L88 Pyoderma gangrenosum: Secondary | ICD-10-CM | POA: Diagnosis not present

## 2016-04-24 DIAGNOSIS — L88 Pyoderma gangrenosum: Secondary | ICD-10-CM | POA: Diagnosis not present

## 2016-04-25 DIAGNOSIS — F319 Bipolar disorder, unspecified: Secondary | ICD-10-CM | POA: Diagnosis not present

## 2016-04-28 DIAGNOSIS — E11622 Type 2 diabetes mellitus with other skin ulcer: Secondary | ICD-10-CM | POA: Diagnosis not present

## 2016-04-28 DIAGNOSIS — L88 Pyoderma gangrenosum: Secondary | ICD-10-CM | POA: Diagnosis not present

## 2016-05-02 DIAGNOSIS — L88 Pyoderma gangrenosum: Secondary | ICD-10-CM | POA: Diagnosis not present

## 2016-05-05 DIAGNOSIS — L97122 Non-pressure chronic ulcer of left thigh with fat layer exposed: Secondary | ICD-10-CM | POA: Diagnosis not present

## 2016-05-05 DIAGNOSIS — L97112 Non-pressure chronic ulcer of right thigh with fat layer exposed: Secondary | ICD-10-CM | POA: Diagnosis not present

## 2016-05-05 DIAGNOSIS — E119 Type 2 diabetes mellitus without complications: Secondary | ICD-10-CM | POA: Diagnosis not present

## 2016-05-05 DIAGNOSIS — L88 Pyoderma gangrenosum: Secondary | ICD-10-CM | POA: Diagnosis not present

## 2016-05-08 DIAGNOSIS — L88 Pyoderma gangrenosum: Secondary | ICD-10-CM | POA: Diagnosis not present

## 2016-05-08 DIAGNOSIS — E119 Type 2 diabetes mellitus without complications: Secondary | ICD-10-CM | POA: Diagnosis not present

## 2016-05-12 DIAGNOSIS — E119 Type 2 diabetes mellitus without complications: Secondary | ICD-10-CM | POA: Diagnosis not present

## 2016-05-12 DIAGNOSIS — L88 Pyoderma gangrenosum: Secondary | ICD-10-CM | POA: Diagnosis not present

## 2016-05-15 DIAGNOSIS — E119 Type 2 diabetes mellitus without complications: Secondary | ICD-10-CM | POA: Diagnosis not present

## 2016-05-15 DIAGNOSIS — L88 Pyoderma gangrenosum: Secondary | ICD-10-CM | POA: Diagnosis not present

## 2016-05-16 DIAGNOSIS — F319 Bipolar disorder, unspecified: Secondary | ICD-10-CM | POA: Diagnosis not present

## 2016-05-19 DIAGNOSIS — Z6837 Body mass index (BMI) 37.0-37.9, adult: Secondary | ICD-10-CM | POA: Diagnosis not present

## 2016-05-19 DIAGNOSIS — L97122 Non-pressure chronic ulcer of left thigh with fat layer exposed: Secondary | ICD-10-CM | POA: Diagnosis not present

## 2016-05-19 DIAGNOSIS — M79651 Pain in right thigh: Secondary | ICD-10-CM | POA: Diagnosis not present

## 2016-05-19 DIAGNOSIS — L88 Pyoderma gangrenosum: Secondary | ICD-10-CM | POA: Diagnosis not present

## 2016-05-19 DIAGNOSIS — L97112 Non-pressure chronic ulcer of right thigh with fat layer exposed: Secondary | ICD-10-CM | POA: Diagnosis not present

## 2016-05-19 DIAGNOSIS — E119 Type 2 diabetes mellitus without complications: Secondary | ICD-10-CM | POA: Diagnosis not present

## 2016-05-19 DIAGNOSIS — M5416 Radiculopathy, lumbar region: Secondary | ICD-10-CM | POA: Diagnosis not present

## 2016-05-20 DIAGNOSIS — M25551 Pain in right hip: Secondary | ICD-10-CM | POA: Diagnosis not present

## 2016-05-20 DIAGNOSIS — M79651 Pain in right thigh: Secondary | ICD-10-CM | POA: Diagnosis not present

## 2016-05-22 DIAGNOSIS — K640 First degree hemorrhoids: Secondary | ICD-10-CM | POA: Diagnosis not present

## 2016-05-22 DIAGNOSIS — D124 Benign neoplasm of descending colon: Secondary | ICD-10-CM | POA: Diagnosis not present

## 2016-05-22 DIAGNOSIS — L88 Pyoderma gangrenosum: Secondary | ICD-10-CM | POA: Diagnosis not present

## 2016-05-22 DIAGNOSIS — Z86010 Personal history of colonic polyps: Secondary | ICD-10-CM | POA: Diagnosis not present

## 2016-05-22 DIAGNOSIS — D126 Benign neoplasm of colon, unspecified: Secondary | ICD-10-CM | POA: Diagnosis not present

## 2016-05-22 DIAGNOSIS — K6389 Other specified diseases of intestine: Secondary | ICD-10-CM | POA: Diagnosis not present

## 2016-05-22 DIAGNOSIS — E119 Type 2 diabetes mellitus without complications: Secondary | ICD-10-CM | POA: Diagnosis not present

## 2016-05-26 DIAGNOSIS — M6281 Muscle weakness (generalized): Secondary | ICD-10-CM | POA: Diagnosis not present

## 2016-05-26 DIAGNOSIS — L88 Pyoderma gangrenosum: Secondary | ICD-10-CM | POA: Diagnosis not present

## 2016-05-26 DIAGNOSIS — L97112 Non-pressure chronic ulcer of right thigh with fat layer exposed: Secondary | ICD-10-CM | POA: Diagnosis not present

## 2016-05-26 DIAGNOSIS — M545 Low back pain: Secondary | ICD-10-CM | POA: Diagnosis not present

## 2016-05-26 DIAGNOSIS — L97122 Non-pressure chronic ulcer of left thigh with fat layer exposed: Secondary | ICD-10-CM | POA: Diagnosis not present

## 2016-05-26 DIAGNOSIS — E119 Type 2 diabetes mellitus without complications: Secondary | ICD-10-CM | POA: Diagnosis not present

## 2016-05-26 DIAGNOSIS — M79605 Pain in left leg: Secondary | ICD-10-CM | POA: Diagnosis not present

## 2016-05-26 DIAGNOSIS — M79604 Pain in right leg: Secondary | ICD-10-CM | POA: Diagnosis not present

## 2016-05-27 DIAGNOSIS — D126 Benign neoplasm of colon, unspecified: Secondary | ICD-10-CM | POA: Diagnosis not present

## 2016-05-28 DIAGNOSIS — M79604 Pain in right leg: Secondary | ICD-10-CM | POA: Diagnosis not present

## 2016-05-28 DIAGNOSIS — M545 Low back pain: Secondary | ICD-10-CM | POA: Diagnosis not present

## 2016-05-28 DIAGNOSIS — M6281 Muscle weakness (generalized): Secondary | ICD-10-CM | POA: Diagnosis not present

## 2016-05-28 DIAGNOSIS — M79605 Pain in left leg: Secondary | ICD-10-CM | POA: Diagnosis not present

## 2016-05-30 DIAGNOSIS — E119 Type 2 diabetes mellitus without complications: Secondary | ICD-10-CM | POA: Diagnosis not present

## 2016-05-30 DIAGNOSIS — L88 Pyoderma gangrenosum: Secondary | ICD-10-CM | POA: Diagnosis not present

## 2016-06-02 DIAGNOSIS — L88 Pyoderma gangrenosum: Secondary | ICD-10-CM | POA: Diagnosis not present

## 2016-06-02 DIAGNOSIS — E11622 Type 2 diabetes mellitus with other skin ulcer: Secondary | ICD-10-CM | POA: Diagnosis not present

## 2016-06-02 DIAGNOSIS — L97121 Non-pressure chronic ulcer of left thigh limited to breakdown of skin: Secondary | ICD-10-CM | POA: Diagnosis not present

## 2016-06-04 DIAGNOSIS — M79604 Pain in right leg: Secondary | ICD-10-CM | POA: Diagnosis not present

## 2016-06-04 DIAGNOSIS — M6281 Muscle weakness (generalized): Secondary | ICD-10-CM | POA: Diagnosis not present

## 2016-06-04 DIAGNOSIS — M79605 Pain in left leg: Secondary | ICD-10-CM | POA: Diagnosis not present

## 2016-06-04 DIAGNOSIS — M545 Low back pain: Secondary | ICD-10-CM | POA: Diagnosis not present

## 2016-06-06 DIAGNOSIS — M961 Postlaminectomy syndrome, not elsewhere classified: Secondary | ICD-10-CM | POA: Diagnosis not present

## 2016-06-06 DIAGNOSIS — M4726 Other spondylosis with radiculopathy, lumbar region: Secondary | ICD-10-CM | POA: Diagnosis not present

## 2016-06-06 DIAGNOSIS — M48062 Spinal stenosis, lumbar region with neurogenic claudication: Secondary | ICD-10-CM | POA: Diagnosis not present

## 2016-06-06 DIAGNOSIS — M5431 Sciatica, right side: Secondary | ICD-10-CM | POA: Diagnosis not present

## 2016-06-09 DIAGNOSIS — M5416 Radiculopathy, lumbar region: Secondary | ICD-10-CM | POA: Diagnosis not present

## 2016-06-09 DIAGNOSIS — L97122 Non-pressure chronic ulcer of left thigh with fat layer exposed: Secondary | ICD-10-CM | POA: Diagnosis not present

## 2016-06-09 DIAGNOSIS — E119 Type 2 diabetes mellitus without complications: Secondary | ICD-10-CM | POA: Diagnosis not present

## 2016-06-09 DIAGNOSIS — M79605 Pain in left leg: Secondary | ICD-10-CM | POA: Diagnosis not present

## 2016-06-09 DIAGNOSIS — M79604 Pain in right leg: Secondary | ICD-10-CM | POA: Diagnosis not present

## 2016-06-09 DIAGNOSIS — L88 Pyoderma gangrenosum: Secondary | ICD-10-CM | POA: Diagnosis not present

## 2016-06-09 DIAGNOSIS — M6281 Muscle weakness (generalized): Secondary | ICD-10-CM | POA: Diagnosis not present

## 2016-06-09 DIAGNOSIS — M545 Low back pain: Secondary | ICD-10-CM | POA: Diagnosis not present

## 2016-06-09 DIAGNOSIS — Z6838 Body mass index (BMI) 38.0-38.9, adult: Secondary | ICD-10-CM | POA: Diagnosis not present

## 2016-06-09 DIAGNOSIS — Z0271 Encounter for disability determination: Secondary | ICD-10-CM | POA: Diagnosis not present

## 2016-06-09 DIAGNOSIS — G894 Chronic pain syndrome: Secondary | ICD-10-CM | POA: Diagnosis not present

## 2016-06-12 DIAGNOSIS — L88 Pyoderma gangrenosum: Secondary | ICD-10-CM | POA: Diagnosis not present

## 2016-06-16 DIAGNOSIS — L88 Pyoderma gangrenosum: Secondary | ICD-10-CM | POA: Diagnosis not present

## 2016-06-16 DIAGNOSIS — L97122 Non-pressure chronic ulcer of left thigh with fat layer exposed: Secondary | ICD-10-CM | POA: Diagnosis not present

## 2016-06-17 DIAGNOSIS — K5900 Constipation, unspecified: Secondary | ICD-10-CM | POA: Diagnosis not present

## 2016-06-17 DIAGNOSIS — D126 Benign neoplasm of colon, unspecified: Secondary | ICD-10-CM | POA: Diagnosis not present

## 2016-06-19 DIAGNOSIS — L88 Pyoderma gangrenosum: Secondary | ICD-10-CM | POA: Diagnosis not present

## 2016-06-20 ENCOUNTER — Other Ambulatory Visit: Payer: Self-pay | Admitting: Orthopedic Surgery

## 2016-06-20 DIAGNOSIS — M4726 Other spondylosis with radiculopathy, lumbar region: Secondary | ICD-10-CM

## 2016-06-23 DIAGNOSIS — L88 Pyoderma gangrenosum: Secondary | ICD-10-CM | POA: Diagnosis not present

## 2016-06-23 DIAGNOSIS — L97122 Non-pressure chronic ulcer of left thigh with fat layer exposed: Secondary | ICD-10-CM | POA: Diagnosis not present

## 2016-06-23 DIAGNOSIS — E119 Type 2 diabetes mellitus without complications: Secondary | ICD-10-CM | POA: Diagnosis not present

## 2016-06-27 ENCOUNTER — Ambulatory Visit
Admission: RE | Admit: 2016-06-27 | Discharge: 2016-06-27 | Disposition: A | Discharge status: Home / Self Care | Payer: BLUE CROSS/BLUE SHIELD | Source: Ambulatory Visit | Attending: Orthopedic Surgery | Admitting: Orthopedic Surgery

## 2016-06-27 DIAGNOSIS — M48061 Spinal stenosis, lumbar region without neurogenic claudication: Secondary | ICD-10-CM | POA: Diagnosis not present

## 2016-06-27 DIAGNOSIS — E119 Type 2 diabetes mellitus without complications: Secondary | ICD-10-CM | POA: Diagnosis not present

## 2016-06-27 DIAGNOSIS — M4726 Other spondylosis with radiculopathy, lumbar region: Secondary | ICD-10-CM

## 2016-06-27 DIAGNOSIS — L88 Pyoderma gangrenosum: Secondary | ICD-10-CM | POA: Diagnosis not present

## 2016-06-30 DIAGNOSIS — L97122 Non-pressure chronic ulcer of left thigh with fat layer exposed: Secondary | ICD-10-CM | POA: Diagnosis not present

## 2016-06-30 DIAGNOSIS — L88 Pyoderma gangrenosum: Secondary | ICD-10-CM | POA: Diagnosis not present

## 2016-07-04 DIAGNOSIS — L88 Pyoderma gangrenosum: Secondary | ICD-10-CM | POA: Diagnosis not present

## 2016-07-04 DIAGNOSIS — E119 Type 2 diabetes mellitus without complications: Secondary | ICD-10-CM | POA: Diagnosis not present

## 2016-07-11 DIAGNOSIS — F4310 Post-traumatic stress disorder, unspecified: Secondary | ICD-10-CM | POA: Diagnosis not present

## 2016-07-14 DIAGNOSIS — E11622 Type 2 diabetes mellitus with other skin ulcer: Secondary | ICD-10-CM | POA: Diagnosis not present

## 2016-07-14 DIAGNOSIS — L88 Pyoderma gangrenosum: Secondary | ICD-10-CM | POA: Diagnosis not present

## 2016-07-14 DIAGNOSIS — E119 Type 2 diabetes mellitus without complications: Secondary | ICD-10-CM | POA: Diagnosis not present

## 2016-07-17 DIAGNOSIS — E119 Type 2 diabetes mellitus without complications: Secondary | ICD-10-CM | POA: Diagnosis not present

## 2016-07-17 DIAGNOSIS — E039 Hypothyroidism, unspecified: Secondary | ICD-10-CM | POA: Diagnosis not present

## 2016-07-17 DIAGNOSIS — I10 Essential (primary) hypertension: Secondary | ICD-10-CM | POA: Diagnosis not present

## 2016-07-17 DIAGNOSIS — E782 Mixed hyperlipidemia: Secondary | ICD-10-CM | POA: Diagnosis not present

## 2016-07-17 DIAGNOSIS — L88 Pyoderma gangrenosum: Secondary | ICD-10-CM | POA: Diagnosis not present

## 2016-07-17 DIAGNOSIS — M5416 Radiculopathy, lumbar region: Secondary | ICD-10-CM | POA: Diagnosis not present

## 2016-07-18 DIAGNOSIS — M5432 Sciatica, left side: Secondary | ICD-10-CM | POA: Diagnosis not present

## 2016-07-18 DIAGNOSIS — M961 Postlaminectomy syndrome, not elsewhere classified: Secondary | ICD-10-CM | POA: Diagnosis not present

## 2016-07-18 DIAGNOSIS — M48062 Spinal stenosis, lumbar region with neurogenic claudication: Secondary | ICD-10-CM | POA: Diagnosis not present

## 2016-07-18 DIAGNOSIS — M519 Unspecified thoracic, thoracolumbar and lumbosacral intervertebral disc disorder: Secondary | ICD-10-CM | POA: Diagnosis not present

## 2016-07-21 DIAGNOSIS — E119 Type 2 diabetes mellitus without complications: Secondary | ICD-10-CM | POA: Diagnosis not present

## 2016-07-21 DIAGNOSIS — Z09 Encounter for follow-up examination after completed treatment for conditions other than malignant neoplasm: Secondary | ICD-10-CM | POA: Diagnosis not present

## 2016-07-21 DIAGNOSIS — L88 Pyoderma gangrenosum: Secondary | ICD-10-CM | POA: Diagnosis not present

## 2016-07-21 DIAGNOSIS — Z872 Personal history of diseases of the skin and subcutaneous tissue: Secondary | ICD-10-CM | POA: Diagnosis not present

## 2016-07-31 DIAGNOSIS — Z6841 Body Mass Index (BMI) 40.0 and over, adult: Secondary | ICD-10-CM | POA: Diagnosis not present

## 2016-07-31 DIAGNOSIS — Z01818 Encounter for other preprocedural examination: Secondary | ICD-10-CM | POA: Diagnosis not present

## 2016-08-08 DIAGNOSIS — M48062 Spinal stenosis, lumbar region with neurogenic claudication: Secondary | ICD-10-CM | POA: Diagnosis not present

## 2016-08-08 DIAGNOSIS — M961 Postlaminectomy syndrome, not elsewhere classified: Secondary | ICD-10-CM | POA: Insufficient documentation

## 2016-08-08 DIAGNOSIS — M4800 Spinal stenosis, site unspecified: Secondary | ICD-10-CM | POA: Insufficient documentation

## 2016-08-08 DIAGNOSIS — M545 Low back pain: Secondary | ICD-10-CM | POA: Diagnosis not present

## 2016-08-08 DIAGNOSIS — Z22322 Carrier or suspected carrier of Methicillin resistant Staphylococcus aureus: Secondary | ICD-10-CM

## 2016-08-08 DIAGNOSIS — Z4689 Encounter for fitting and adjustment of other specified devices: Secondary | ICD-10-CM | POA: Diagnosis not present

## 2016-08-08 DIAGNOSIS — M5430 Sciatica, unspecified side: Secondary | ICD-10-CM | POA: Insufficient documentation

## 2016-08-08 DIAGNOSIS — M4726 Other spondylosis with radiculopathy, lumbar region: Secondary | ICD-10-CM | POA: Diagnosis not present

## 2016-08-08 HISTORY — DX: Sciatica, unspecified side: M54.30

## 2016-08-08 HISTORY — DX: Spinal stenosis, site unspecified: M48.00

## 2016-08-08 HISTORY — DX: Postlaminectomy syndrome, not elsewhere classified: M96.1

## 2016-08-08 HISTORY — DX: Carrier or suspected carrier of methicillin resistant Staphylococcus aureus: Z22.322

## 2016-08-13 DIAGNOSIS — F319 Bipolar disorder, unspecified: Secondary | ICD-10-CM | POA: Diagnosis not present

## 2016-08-13 DIAGNOSIS — E079 Disorder of thyroid, unspecified: Secondary | ICD-10-CM | POA: Diagnosis not present

## 2016-08-13 DIAGNOSIS — M5431 Sciatica, right side: Secondary | ICD-10-CM | POA: Diagnosis not present

## 2016-08-13 DIAGNOSIS — M48062 Spinal stenosis, lumbar region with neurogenic claudication: Secondary | ICD-10-CM | POA: Diagnosis not present

## 2016-08-13 DIAGNOSIS — M5430 Sciatica, unspecified side: Secondary | ICD-10-CM | POA: Diagnosis not present

## 2016-08-13 DIAGNOSIS — F419 Anxiety disorder, unspecified: Secondary | ICD-10-CM | POA: Diagnosis not present

## 2016-08-13 DIAGNOSIS — Z981 Arthrodesis status: Secondary | ICD-10-CM | POA: Diagnosis not present

## 2016-08-13 DIAGNOSIS — M4726 Other spondylosis with radiculopathy, lumbar region: Secondary | ICD-10-CM | POA: Diagnosis not present

## 2016-08-13 DIAGNOSIS — F17210 Nicotine dependence, cigarettes, uncomplicated: Secondary | ICD-10-CM | POA: Diagnosis not present

## 2016-08-13 DIAGNOSIS — Z6841 Body Mass Index (BMI) 40.0 and over, adult: Secondary | ICD-10-CM | POA: Diagnosis not present

## 2016-08-13 DIAGNOSIS — G4730 Sleep apnea, unspecified: Secondary | ICD-10-CM | POA: Diagnosis not present

## 2016-08-13 DIAGNOSIS — M4716 Other spondylosis with myelopathy, lumbar region: Secondary | ICD-10-CM | POA: Diagnosis not present

## 2016-08-13 DIAGNOSIS — M961 Postlaminectomy syndrome, not elsewhere classified: Secondary | ICD-10-CM | POA: Diagnosis not present

## 2016-08-13 DIAGNOSIS — E669 Obesity, unspecified: Secondary | ICD-10-CM | POA: Diagnosis not present

## 2016-08-13 DIAGNOSIS — M5432 Sciatica, left side: Secondary | ICD-10-CM | POA: Diagnosis not present

## 2016-08-13 DIAGNOSIS — E1141 Type 2 diabetes mellitus with diabetic mononeuropathy: Secondary | ICD-10-CM | POA: Diagnosis not present

## 2016-08-13 DIAGNOSIS — M5106 Intervertebral disc disorders with myelopathy, lumbar region: Secondary | ICD-10-CM | POA: Diagnosis not present

## 2016-08-13 DIAGNOSIS — G9520 Unspecified cord compression: Secondary | ICD-10-CM | POA: Diagnosis not present

## 2016-08-13 DIAGNOSIS — I10 Essential (primary) hypertension: Secondary | ICD-10-CM | POA: Diagnosis not present

## 2016-08-13 DIAGNOSIS — M545 Low back pain: Secondary | ICD-10-CM | POA: Diagnosis not present

## 2016-08-15 DIAGNOSIS — M961 Postlaminectomy syndrome, not elsewhere classified: Secondary | ICD-10-CM | POA: Diagnosis not present

## 2016-08-15 DIAGNOSIS — G9520 Unspecified cord compression: Secondary | ICD-10-CM | POA: Diagnosis not present

## 2016-08-15 DIAGNOSIS — Z981 Arthrodesis status: Secondary | ICD-10-CM | POA: Diagnosis not present

## 2016-08-16 DIAGNOSIS — Z791 Long term (current) use of non-steroidal anti-inflammatories (NSAID): Secondary | ICD-10-CM | POA: Diagnosis not present

## 2016-08-16 DIAGNOSIS — Z4789 Encounter for other orthopedic aftercare: Secondary | ICD-10-CM | POA: Diagnosis not present

## 2016-08-16 DIAGNOSIS — I10 Essential (primary) hypertension: Secondary | ICD-10-CM | POA: Diagnosis not present

## 2016-08-16 DIAGNOSIS — G894 Chronic pain syndrome: Secondary | ICD-10-CM | POA: Diagnosis not present

## 2016-08-16 DIAGNOSIS — M545 Low back pain: Secondary | ICD-10-CM | POA: Diagnosis not present

## 2016-08-22 DIAGNOSIS — G894 Chronic pain syndrome: Secondary | ICD-10-CM | POA: Diagnosis not present

## 2016-08-22 DIAGNOSIS — M545 Low back pain: Secondary | ICD-10-CM | POA: Diagnosis not present

## 2016-08-22 DIAGNOSIS — Z4789 Encounter for other orthopedic aftercare: Secondary | ICD-10-CM | POA: Diagnosis not present

## 2016-08-22 DIAGNOSIS — Z791 Long term (current) use of non-steroidal anti-inflammatories (NSAID): Secondary | ICD-10-CM | POA: Diagnosis not present

## 2016-08-22 DIAGNOSIS — I10 Essential (primary) hypertension: Secondary | ICD-10-CM | POA: Diagnosis not present

## 2016-09-03 DIAGNOSIS — I10 Essential (primary) hypertension: Secondary | ICD-10-CM | POA: Diagnosis not present

## 2016-09-03 DIAGNOSIS — Z4789 Encounter for other orthopedic aftercare: Secondary | ICD-10-CM | POA: Diagnosis not present

## 2016-09-03 DIAGNOSIS — G894 Chronic pain syndrome: Secondary | ICD-10-CM | POA: Diagnosis not present

## 2016-09-03 DIAGNOSIS — Z791 Long term (current) use of non-steroidal anti-inflammatories (NSAID): Secondary | ICD-10-CM | POA: Diagnosis not present

## 2016-09-03 DIAGNOSIS — M545 Low back pain: Secondary | ICD-10-CM | POA: Diagnosis not present

## 2016-09-04 DIAGNOSIS — I10 Essential (primary) hypertension: Secondary | ICD-10-CM | POA: Diagnosis not present

## 2016-09-04 DIAGNOSIS — M545 Low back pain: Secondary | ICD-10-CM | POA: Diagnosis not present

## 2016-09-04 DIAGNOSIS — Z4789 Encounter for other orthopedic aftercare: Secondary | ICD-10-CM | POA: Diagnosis not present

## 2016-09-05 ENCOUNTER — Other Ambulatory Visit: Payer: Self-pay | Admitting: Orthopedic Surgery

## 2016-09-05 DIAGNOSIS — M545 Low back pain: Secondary | ICD-10-CM | POA: Diagnosis not present

## 2016-09-05 DIAGNOSIS — M48062 Spinal stenosis, lumbar region with neurogenic claudication: Secondary | ICD-10-CM | POA: Diagnosis not present

## 2016-09-05 DIAGNOSIS — M4726 Other spondylosis with radiculopathy, lumbar region: Secondary | ICD-10-CM

## 2016-09-09 ENCOUNTER — Ambulatory Visit
Admission: RE | Admit: 2016-09-09 | Discharge: 2016-09-09 | Disposition: A | Discharge status: Home / Self Care | Payer: BLUE CROSS/BLUE SHIELD | Source: Ambulatory Visit | Attending: Orthopedic Surgery | Admitting: Orthopedic Surgery

## 2016-09-09 DIAGNOSIS — M4726 Other spondylosis with radiculopathy, lumbar region: Secondary | ICD-10-CM

## 2016-09-09 DIAGNOSIS — M5126 Other intervertebral disc displacement, lumbar region: Secondary | ICD-10-CM | POA: Diagnosis not present

## 2016-09-09 MED ORDER — IOPAMIDOL (ISOVUE-M 300) INJECTION 61%
10.0000 mL | Freq: Once | INTRAMUSCULAR | Status: AC | PRN
  Administered 2016-09-09: 10 mL via INTRATHECAL

## 2016-09-09 MED ORDER — HYDROMORPHONE HCL 2 MG/ML IJ SOLN
2.0000 mg | Freq: Once | INTRAMUSCULAR | Status: AC
  Administered 2016-09-09: 2 mg via INTRAMUSCULAR

## 2016-09-09 MED ORDER — HYDROXYZINE HCL 50 MG/ML IM SOLN
25.0000 mg | Freq: Once | INTRAMUSCULAR | Status: AC
  Administered 2016-09-09: 25 mg via INTRAMUSCULAR

## 2016-09-09 MED ORDER — DIAZEPAM 5 MG PO TABS
10.0000 mg | ORAL_TABLET | Freq: Once | ORAL | Status: AC
  Administered 2016-09-09: 10 mg via ORAL

## 2016-09-09 NOTE — Discharge Instructions (Addendum)
Myelogram Discharge Instructions  1. Go home and rest quietly for the next 24 hours.  It is important to lie flat for the next 24 hours.  Get up only to go to the restroom.  You may lie in the bed or on a couch on your back, your stomach, your left side or your right side.  You may have one pillow under your head.  You may have pillows between your knees while you are on your side or under your knees while you are on your back.  2. DO NOT drive today.  Recline the seat as far back as it will go, while still wearing your seat belt, on the way home.  3. You may get up to go to the bathroom as needed.  You may sit up for 10 minutes to eat.  You may resume your normal diet and medications unless otherwise indicated.  Drink plenty of extra fluids today and tomorrow.  4. The incidence of a spinal headache with nausea and/or vomiting is about 5% (one in 20 patients).  If you develop a headache, lie flat and drink plenty of fluids until the headache goes away.  Caffeinated beverages may be helpful.  If you develop severe nausea and vomiting or a headache that does not go away with flat bed rest, call [phone].  5. You may resume normal activities after your 24 hours of bed rest is over; however, do not exert yourself strongly or do any heavy lifting tomorrow.  6. Call your physician for a follow-up appointment.    You may resume Zoloft, Cymbalta, and Trazodone  on Wednesday, September 10, 2016 after 10:30a.m.

## 2016-09-09 NOTE — Progress Notes (Signed)
Patient states he has been off Cymbalta and Zoloft for at least the past two days.  jkl

## 2016-09-17 DIAGNOSIS — Y792 Prosthetic and other implants, materials and accessory orthopedic devices associated with adverse incidents: Secondary | ICD-10-CM | POA: Diagnosis not present

## 2016-09-17 DIAGNOSIS — T84226A Displacement of internal fixation device of vertebrae, initial encounter: Secondary | ICD-10-CM | POA: Diagnosis not present

## 2016-09-17 DIAGNOSIS — M4326 Fusion of spine, lumbar region: Secondary | ICD-10-CM | POA: Diagnosis not present

## 2016-09-17 DIAGNOSIS — M4726 Other spondylosis with radiculopathy, lumbar region: Secondary | ICD-10-CM | POA: Diagnosis not present

## 2016-09-17 DIAGNOSIS — M960 Pseudarthrosis after fusion or arthrodesis: Secondary | ICD-10-CM | POA: Diagnosis not present

## 2016-09-17 DIAGNOSIS — T84296A Other mechanical complication of internal fixation device of vertebrae, initial encounter: Secondary | ICD-10-CM | POA: Diagnosis not present

## 2016-09-17 DIAGNOSIS — M961 Postlaminectomy syndrome, not elsewhere classified: Secondary | ICD-10-CM | POA: Diagnosis not present

## 2016-09-19 DIAGNOSIS — M4326 Fusion of spine, lumbar region: Secondary | ICD-10-CM | POA: Diagnosis not present

## 2016-09-19 DIAGNOSIS — M4726 Other spondylosis with radiculopathy, lumbar region: Secondary | ICD-10-CM | POA: Diagnosis not present

## 2016-09-19 DIAGNOSIS — T84226A Displacement of internal fixation device of vertebrae, initial encounter: Secondary | ICD-10-CM | POA: Diagnosis not present

## 2016-09-20 DIAGNOSIS — Z4789 Encounter for other orthopedic aftercare: Secondary | ICD-10-CM | POA: Diagnosis not present

## 2016-09-20 DIAGNOSIS — I10 Essential (primary) hypertension: Secondary | ICD-10-CM | POA: Diagnosis not present

## 2016-09-20 DIAGNOSIS — M545 Low back pain: Secondary | ICD-10-CM | POA: Diagnosis not present

## 2016-09-20 DIAGNOSIS — G894 Chronic pain syndrome: Secondary | ICD-10-CM | POA: Diagnosis not present

## 2016-09-23 DIAGNOSIS — I10 Essential (primary) hypertension: Secondary | ICD-10-CM | POA: Diagnosis not present

## 2016-09-23 DIAGNOSIS — Z4789 Encounter for other orthopedic aftercare: Secondary | ICD-10-CM | POA: Diagnosis not present

## 2016-09-23 DIAGNOSIS — G894 Chronic pain syndrome: Secondary | ICD-10-CM | POA: Diagnosis not present

## 2016-09-23 DIAGNOSIS — M545 Low back pain: Secondary | ICD-10-CM | POA: Diagnosis not present

## 2016-09-29 DIAGNOSIS — M545 Low back pain: Secondary | ICD-10-CM | POA: Diagnosis not present

## 2016-09-29 DIAGNOSIS — I10 Essential (primary) hypertension: Secondary | ICD-10-CM | POA: Diagnosis not present

## 2016-09-29 DIAGNOSIS — G894 Chronic pain syndrome: Secondary | ICD-10-CM | POA: Diagnosis not present

## 2016-09-29 DIAGNOSIS — Z4789 Encounter for other orthopedic aftercare: Secondary | ICD-10-CM | POA: Diagnosis not present

## 2016-10-09 DIAGNOSIS — G4730 Sleep apnea, unspecified: Secondary | ICD-10-CM | POA: Diagnosis not present

## 2016-10-09 DIAGNOSIS — M79642 Pain in left hand: Secondary | ICD-10-CM | POA: Diagnosis not present

## 2016-10-09 DIAGNOSIS — Z6838 Body mass index (BMI) 38.0-38.9, adult: Secondary | ICD-10-CM | POA: Diagnosis not present

## 2016-10-14 DIAGNOSIS — M961 Postlaminectomy syndrome, not elsewhere classified: Secondary | ICD-10-CM | POA: Diagnosis not present

## 2016-10-14 DIAGNOSIS — M545 Low back pain: Secondary | ICD-10-CM | POA: Diagnosis not present

## 2016-10-17 DIAGNOSIS — F319 Bipolar disorder, unspecified: Secondary | ICD-10-CM | POA: Diagnosis not present

## 2016-10-27 DIAGNOSIS — J209 Acute bronchitis, unspecified: Secondary | ICD-10-CM | POA: Diagnosis not present

## 2016-10-27 DIAGNOSIS — J205 Acute bronchitis due to respiratory syncytial virus: Secondary | ICD-10-CM | POA: Diagnosis not present

## 2016-10-27 DIAGNOSIS — Z6838 Body mass index (BMI) 38.0-38.9, adult: Secondary | ICD-10-CM | POA: Diagnosis not present

## 2016-10-27 DIAGNOSIS — M79642 Pain in left hand: Secondary | ICD-10-CM | POA: Diagnosis not present

## 2016-10-29 DIAGNOSIS — F17210 Nicotine dependence, cigarettes, uncomplicated: Secondary | ICD-10-CM | POA: Diagnosis not present

## 2016-10-29 DIAGNOSIS — J4520 Mild intermittent asthma, uncomplicated: Secondary | ICD-10-CM | POA: Diagnosis not present

## 2016-10-29 DIAGNOSIS — G4733 Obstructive sleep apnea (adult) (pediatric): Secondary | ICD-10-CM | POA: Diagnosis not present

## 2016-10-29 DIAGNOSIS — R5383 Other fatigue: Secondary | ICD-10-CM | POA: Diagnosis not present

## 2016-10-30 DIAGNOSIS — R5383 Other fatigue: Secondary | ICD-10-CM | POA: Diagnosis not present

## 2016-11-04 DIAGNOSIS — H2513 Age-related nuclear cataract, bilateral: Secondary | ICD-10-CM | POA: Diagnosis not present

## 2016-11-04 DIAGNOSIS — H40033 Anatomical narrow angle, bilateral: Secondary | ICD-10-CM | POA: Diagnosis not present

## 2016-11-19 DIAGNOSIS — G4733 Obstructive sleep apnea (adult) (pediatric): Secondary | ICD-10-CM | POA: Diagnosis not present

## 2016-11-26 DIAGNOSIS — F17210 Nicotine dependence, cigarettes, uncomplicated: Secondary | ICD-10-CM | POA: Diagnosis not present

## 2016-11-26 DIAGNOSIS — G4733 Obstructive sleep apnea (adult) (pediatric): Secondary | ICD-10-CM | POA: Diagnosis not present

## 2016-11-26 DIAGNOSIS — J4530 Mild persistent asthma, uncomplicated: Secondary | ICD-10-CM | POA: Diagnosis not present

## 2016-11-26 DIAGNOSIS — R5383 Other fatigue: Secondary | ICD-10-CM | POA: Diagnosis not present

## 2016-11-27 DIAGNOSIS — R531 Weakness: Secondary | ICD-10-CM | POA: Diagnosis not present

## 2016-11-27 DIAGNOSIS — Z6838 Body mass index (BMI) 38.0-38.9, adult: Secondary | ICD-10-CM | POA: Diagnosis not present

## 2016-11-27 DIAGNOSIS — M5416 Radiculopathy, lumbar region: Secondary | ICD-10-CM | POA: Diagnosis not present

## 2016-11-27 DIAGNOSIS — R29898 Other symptoms and signs involving the musculoskeletal system: Secondary | ICD-10-CM | POA: Diagnosis not present

## 2016-12-11 DIAGNOSIS — M961 Postlaminectomy syndrome, not elsewhere classified: Secondary | ICD-10-CM | POA: Diagnosis not present

## 2016-12-20 DIAGNOSIS — S61211A Laceration without foreign body of left index finger without damage to nail, initial encounter: Secondary | ICD-10-CM | POA: Diagnosis not present

## 2016-12-20 DIAGNOSIS — E119 Type 2 diabetes mellitus without complications: Secondary | ICD-10-CM | POA: Diagnosis not present

## 2016-12-20 DIAGNOSIS — Z882 Allergy status to sulfonamides status: Secondary | ICD-10-CM | POA: Diagnosis not present

## 2016-12-20 DIAGNOSIS — W268XXA Contact with other sharp object(s), not elsewhere classified, initial encounter: Secondary | ICD-10-CM | POA: Diagnosis not present

## 2016-12-20 DIAGNOSIS — Z79899 Other long term (current) drug therapy: Secondary | ICD-10-CM | POA: Diagnosis not present

## 2016-12-20 DIAGNOSIS — M1990 Unspecified osteoarthritis, unspecified site: Secondary | ICD-10-CM | POA: Diagnosis not present

## 2016-12-20 DIAGNOSIS — Z23 Encounter for immunization: Secondary | ICD-10-CM | POA: Diagnosis not present

## 2016-12-20 DIAGNOSIS — F17210 Nicotine dependence, cigarettes, uncomplicated: Secondary | ICD-10-CM | POA: Diagnosis not present

## 2016-12-20 DIAGNOSIS — M79645 Pain in left finger(s): Secondary | ICD-10-CM | POA: Diagnosis not present

## 2016-12-20 DIAGNOSIS — I10 Essential (primary) hypertension: Secondary | ICD-10-CM | POA: Diagnosis not present

## 2016-12-20 DIAGNOSIS — Z7982 Long term (current) use of aspirin: Secondary | ICD-10-CM | POA: Diagnosis not present

## 2016-12-20 DIAGNOSIS — S61217A Laceration without foreign body of left little finger without damage to nail, initial encounter: Secondary | ICD-10-CM | POA: Diagnosis not present

## 2016-12-20 DIAGNOSIS — Z8673 Personal history of transient ischemic attack (TIA), and cerebral infarction without residual deficits: Secondary | ICD-10-CM | POA: Diagnosis not present

## 2016-12-20 DIAGNOSIS — G4730 Sleep apnea, unspecified: Secondary | ICD-10-CM | POA: Diagnosis not present

## 2016-12-20 DIAGNOSIS — S6992XA Unspecified injury of left wrist, hand and finger(s), initial encounter: Secondary | ICD-10-CM | POA: Diagnosis not present

## 2016-12-20 DIAGNOSIS — F319 Bipolar disorder, unspecified: Secondary | ICD-10-CM | POA: Diagnosis not present

## 2017-01-02 DIAGNOSIS — F329 Major depressive disorder, single episode, unspecified: Secondary | ICD-10-CM | POA: Diagnosis not present

## 2017-01-08 DIAGNOSIS — M961 Postlaminectomy syndrome, not elsewhere classified: Secondary | ICD-10-CM | POA: Diagnosis not present

## 2017-01-08 DIAGNOSIS — T84296D Other mechanical complication of internal fixation device of vertebrae, subsequent encounter: Secondary | ICD-10-CM | POA: Diagnosis not present

## 2017-01-08 DIAGNOSIS — M960 Pseudarthrosis after fusion or arthrodesis: Secondary | ICD-10-CM | POA: Diagnosis not present

## 2017-01-09 DIAGNOSIS — G4733 Obstructive sleep apnea (adult) (pediatric): Secondary | ICD-10-CM | POA: Diagnosis not present

## 2017-01-12 DIAGNOSIS — R269 Unspecified abnormalities of gait and mobility: Secondary | ICD-10-CM | POA: Diagnosis not present

## 2017-01-12 DIAGNOSIS — M5412 Radiculopathy, cervical region: Secondary | ICD-10-CM | POA: Diagnosis not present

## 2017-01-12 DIAGNOSIS — G629 Polyneuropathy, unspecified: Secondary | ICD-10-CM | POA: Diagnosis not present

## 2017-01-12 DIAGNOSIS — M6281 Muscle weakness (generalized): Secondary | ICD-10-CM | POA: Diagnosis not present

## 2017-01-13 DIAGNOSIS — M50221 Other cervical disc displacement at C4-C5 level: Secondary | ICD-10-CM | POA: Diagnosis not present

## 2017-01-13 DIAGNOSIS — M5412 Radiculopathy, cervical region: Secondary | ICD-10-CM | POA: Diagnosis not present

## 2017-01-13 DIAGNOSIS — R531 Weakness: Secondary | ICD-10-CM | POA: Diagnosis not present

## 2017-01-13 DIAGNOSIS — M50223 Other cervical disc displacement at C6-C7 level: Secondary | ICD-10-CM | POA: Diagnosis not present

## 2017-01-13 DIAGNOSIS — M50222 Other cervical disc displacement at C5-C6 level: Secondary | ICD-10-CM | POA: Diagnosis not present

## 2017-01-15 DIAGNOSIS — G5621 Lesion of ulnar nerve, right upper limb: Secondary | ICD-10-CM | POA: Diagnosis not present

## 2017-01-22 DIAGNOSIS — Z23 Encounter for immunization: Secondary | ICD-10-CM | POA: Diagnosis not present

## 2017-01-22 DIAGNOSIS — E039 Hypothyroidism, unspecified: Secondary | ICD-10-CM | POA: Diagnosis not present

## 2017-01-22 DIAGNOSIS — Z79899 Other long term (current) drug therapy: Secondary | ICD-10-CM | POA: Diagnosis not present

## 2017-01-22 DIAGNOSIS — E119 Type 2 diabetes mellitus without complications: Secondary | ICD-10-CM | POA: Diagnosis not present

## 2017-01-22 DIAGNOSIS — E782 Mixed hyperlipidemia: Secondary | ICD-10-CM | POA: Diagnosis not present

## 2017-01-22 DIAGNOSIS — I10 Essential (primary) hypertension: Secondary | ICD-10-CM | POA: Diagnosis not present

## 2017-01-30 DIAGNOSIS — G5603 Carpal tunnel syndrome, bilateral upper limbs: Secondary | ICD-10-CM | POA: Diagnosis not present

## 2017-01-30 DIAGNOSIS — G5621 Lesion of ulnar nerve, right upper limb: Secondary | ICD-10-CM | POA: Diagnosis not present

## 2017-01-30 DIAGNOSIS — M5416 Radiculopathy, lumbar region: Secondary | ICD-10-CM | POA: Diagnosis not present

## 2017-01-30 DIAGNOSIS — G629 Polyneuropathy, unspecified: Secondary | ICD-10-CM | POA: Diagnosis not present

## 2017-02-05 DIAGNOSIS — M50222 Other cervical disc displacement at C5-C6 level: Secondary | ICD-10-CM | POA: Diagnosis not present

## 2017-02-05 DIAGNOSIS — M961 Postlaminectomy syndrome, not elsewhere classified: Secondary | ICD-10-CM | POA: Diagnosis not present

## 2017-02-05 DIAGNOSIS — M4802 Spinal stenosis, cervical region: Secondary | ICD-10-CM | POA: Diagnosis not present

## 2017-02-05 DIAGNOSIS — M50223 Other cervical disc displacement at C6-C7 level: Secondary | ICD-10-CM | POA: Diagnosis not present

## 2017-02-08 DIAGNOSIS — G4733 Obstructive sleep apnea (adult) (pediatric): Secondary | ICD-10-CM | POA: Diagnosis not present

## 2017-02-10 DIAGNOSIS — J329 Chronic sinusitis, unspecified: Secondary | ICD-10-CM | POA: Diagnosis not present

## 2017-02-10 DIAGNOSIS — J019 Acute sinusitis, unspecified: Secondary | ICD-10-CM | POA: Diagnosis not present

## 2017-02-17 DIAGNOSIS — J4520 Mild intermittent asthma, uncomplicated: Secondary | ICD-10-CM | POA: Diagnosis not present

## 2017-02-17 DIAGNOSIS — R5383 Other fatigue: Secondary | ICD-10-CM | POA: Diagnosis not present

## 2017-02-17 DIAGNOSIS — F17210 Nicotine dependence, cigarettes, uncomplicated: Secondary | ICD-10-CM | POA: Diagnosis not present

## 2017-02-17 DIAGNOSIS — G4733 Obstructive sleep apnea (adult) (pediatric): Secondary | ICD-10-CM | POA: Diagnosis not present

## 2017-03-03 DIAGNOSIS — G5621 Lesion of ulnar nerve, right upper limb: Secondary | ICD-10-CM | POA: Diagnosis not present

## 2017-03-03 DIAGNOSIS — G5601 Carpal tunnel syndrome, right upper limb: Secondary | ICD-10-CM | POA: Diagnosis not present

## 2017-03-11 DIAGNOSIS — J0101 Acute recurrent maxillary sinusitis: Secondary | ICD-10-CM | POA: Diagnosis not present

## 2017-03-11 DIAGNOSIS — G4733 Obstructive sleep apnea (adult) (pediatric): Secondary | ICD-10-CM | POA: Diagnosis not present

## 2017-03-11 DIAGNOSIS — J019 Acute sinusitis, unspecified: Secondary | ICD-10-CM | POA: Diagnosis not present

## 2017-03-11 DIAGNOSIS — J329 Chronic sinusitis, unspecified: Secondary | ICD-10-CM | POA: Diagnosis not present

## 2017-03-19 DIAGNOSIS — J343 Hypertrophy of nasal turbinates: Secondary | ICD-10-CM | POA: Diagnosis not present

## 2017-03-19 DIAGNOSIS — J342 Deviated nasal septum: Secondary | ICD-10-CM | POA: Diagnosis not present

## 2017-03-19 DIAGNOSIS — J3489 Other specified disorders of nose and nasal sinuses: Secondary | ICD-10-CM | POA: Diagnosis not present

## 2017-03-19 DIAGNOSIS — R0981 Nasal congestion: Secondary | ICD-10-CM | POA: Diagnosis not present

## 2017-03-20 DIAGNOSIS — M4802 Spinal stenosis, cervical region: Secondary | ICD-10-CM | POA: Diagnosis not present

## 2017-03-20 DIAGNOSIS — M50223 Other cervical disc displacement at C6-C7 level: Secondary | ICD-10-CM | POA: Diagnosis not present

## 2017-03-20 DIAGNOSIS — M542 Cervicalgia: Secondary | ICD-10-CM | POA: Diagnosis not present

## 2017-03-20 DIAGNOSIS — M50222 Other cervical disc displacement at C5-C6 level: Secondary | ICD-10-CM | POA: Diagnosis not present

## 2017-03-24 DIAGNOSIS — Z01818 Encounter for other preprocedural examination: Secondary | ICD-10-CM | POA: Diagnosis not present

## 2017-03-24 DIAGNOSIS — M4802 Spinal stenosis, cervical region: Secondary | ICD-10-CM | POA: Diagnosis not present

## 2017-03-24 DIAGNOSIS — M5020 Other cervical disc displacement, unspecified cervical region: Secondary | ICD-10-CM

## 2017-03-24 HISTORY — DX: Other cervical disc displacement, unspecified cervical region: M50.20

## 2017-03-26 DIAGNOSIS — J208 Acute bronchitis due to other specified organisms: Secondary | ICD-10-CM | POA: Diagnosis not present

## 2017-03-26 DIAGNOSIS — Z6829 Body mass index (BMI) 29.0-29.9, adult: Secondary | ICD-10-CM | POA: Diagnosis not present

## 2017-03-26 DIAGNOSIS — J019 Acute sinusitis, unspecified: Secondary | ICD-10-CM | POA: Diagnosis not present

## 2017-04-06 DIAGNOSIS — Z8709 Personal history of other diseases of the respiratory system: Secondary | ICD-10-CM | POA: Diagnosis not present

## 2017-04-06 DIAGNOSIS — J342 Deviated nasal septum: Secondary | ICD-10-CM | POA: Diagnosis not present

## 2017-04-06 DIAGNOSIS — J3489 Other specified disorders of nose and nasal sinuses: Secondary | ICD-10-CM | POA: Diagnosis not present

## 2017-04-06 DIAGNOSIS — R0981 Nasal congestion: Secondary | ICD-10-CM | POA: Diagnosis not present

## 2017-04-21 DIAGNOSIS — M4802 Spinal stenosis, cervical region: Secondary | ICD-10-CM | POA: Diagnosis not present

## 2017-04-21 DIAGNOSIS — M50222 Other cervical disc displacement at C5-C6 level: Secondary | ICD-10-CM | POA: Diagnosis not present

## 2017-04-21 DIAGNOSIS — M50223 Other cervical disc displacement at C6-C7 level: Secondary | ICD-10-CM | POA: Diagnosis not present

## 2017-04-22 DIAGNOSIS — J342 Deviated nasal septum: Secondary | ICD-10-CM | POA: Diagnosis not present

## 2017-04-22 DIAGNOSIS — J343 Hypertrophy of nasal turbinates: Secondary | ICD-10-CM | POA: Diagnosis not present

## 2017-04-22 DIAGNOSIS — Z7982 Long term (current) use of aspirin: Secondary | ICD-10-CM | POA: Diagnosis not present

## 2017-04-22 DIAGNOSIS — E039 Hypothyroidism, unspecified: Secondary | ICD-10-CM | POA: Diagnosis not present

## 2017-04-22 DIAGNOSIS — J0121 Acute recurrent ethmoidal sinusitis: Secondary | ICD-10-CM | POA: Diagnosis not present

## 2017-04-22 DIAGNOSIS — J329 Chronic sinusitis, unspecified: Secondary | ICD-10-CM | POA: Diagnosis not present

## 2017-04-22 DIAGNOSIS — F17210 Nicotine dependence, cigarettes, uncomplicated: Secondary | ICD-10-CM | POA: Diagnosis not present

## 2017-04-22 DIAGNOSIS — J0101 Acute recurrent maxillary sinusitis: Secondary | ICD-10-CM | POA: Diagnosis not present

## 2017-04-22 DIAGNOSIS — Z79899 Other long term (current) drug therapy: Secondary | ICD-10-CM | POA: Diagnosis not present

## 2017-04-22 DIAGNOSIS — J0111 Acute recurrent frontal sinusitis: Secondary | ICD-10-CM | POA: Diagnosis not present

## 2017-04-22 DIAGNOSIS — J3489 Other specified disorders of nose and nasal sinuses: Secondary | ICD-10-CM | POA: Diagnosis not present

## 2017-04-22 DIAGNOSIS — R0981 Nasal congestion: Secondary | ICD-10-CM | POA: Diagnosis not present

## 2017-04-30 DIAGNOSIS — J329 Chronic sinusitis, unspecified: Secondary | ICD-10-CM | POA: Diagnosis not present

## 2017-05-08 DIAGNOSIS — J329 Chronic sinusitis, unspecified: Secondary | ICD-10-CM | POA: Diagnosis not present

## 2017-05-12 DIAGNOSIS — M542 Cervicalgia: Secondary | ICD-10-CM | POA: Diagnosis not present

## 2017-05-12 DIAGNOSIS — M50223 Other cervical disc displacement at C6-C7 level: Secondary | ICD-10-CM | POA: Diagnosis not present

## 2017-05-12 DIAGNOSIS — Z6837 Body mass index (BMI) 37.0-37.9, adult: Secondary | ICD-10-CM | POA: Diagnosis not present

## 2017-05-12 DIAGNOSIS — M4802 Spinal stenosis, cervical region: Secondary | ICD-10-CM | POA: Diagnosis not present

## 2017-05-12 DIAGNOSIS — M50222 Other cervical disc displacement at C5-C6 level: Secondary | ICD-10-CM | POA: Diagnosis not present

## 2017-05-14 DIAGNOSIS — J329 Chronic sinusitis, unspecified: Secondary | ICD-10-CM | POA: Diagnosis not present

## 2017-05-19 DIAGNOSIS — Z01812 Encounter for preprocedural laboratory examination: Secondary | ICD-10-CM | POA: Diagnosis not present

## 2017-05-20 DIAGNOSIS — G4733 Obstructive sleep apnea (adult) (pediatric): Secondary | ICD-10-CM | POA: Diagnosis not present

## 2017-05-25 DIAGNOSIS — I10 Essential (primary) hypertension: Secondary | ICD-10-CM | POA: Diagnosis not present

## 2017-05-25 DIAGNOSIS — M4722 Other spondylosis with radiculopathy, cervical region: Secondary | ICD-10-CM | POA: Diagnosis not present

## 2017-05-25 DIAGNOSIS — Z981 Arthrodesis status: Secondary | ICD-10-CM | POA: Diagnosis not present

## 2017-05-25 DIAGNOSIS — M4802 Spinal stenosis, cervical region: Secondary | ICD-10-CM | POA: Diagnosis not present

## 2017-05-25 DIAGNOSIS — Z8673 Personal history of transient ischemic attack (TIA), and cerebral infarction without residual deficits: Secondary | ICD-10-CM | POA: Diagnosis not present

## 2017-05-25 DIAGNOSIS — M4322 Fusion of spine, cervical region: Secondary | ICD-10-CM | POA: Diagnosis not present

## 2017-05-25 DIAGNOSIS — M50123 Cervical disc disorder at C6-C7 level with radiculopathy: Secondary | ICD-10-CM | POA: Diagnosis not present

## 2017-05-25 DIAGNOSIS — E039 Hypothyroidism, unspecified: Secondary | ICD-10-CM | POA: Diagnosis not present

## 2017-05-25 DIAGNOSIS — F17210 Nicotine dependence, cigarettes, uncomplicated: Secondary | ICD-10-CM | POA: Diagnosis not present

## 2017-05-25 DIAGNOSIS — G4733 Obstructive sleep apnea (adult) (pediatric): Secondary | ICD-10-CM | POA: Diagnosis not present

## 2017-05-25 DIAGNOSIS — M50223 Other cervical disc displacement at C6-C7 level: Secondary | ICD-10-CM | POA: Diagnosis not present

## 2017-05-25 DIAGNOSIS — M50122 Cervical disc disorder at C5-C6 level with radiculopathy: Secondary | ICD-10-CM | POA: Diagnosis not present

## 2017-05-25 DIAGNOSIS — M50023 Cervical disc disorder at C6-C7 level with myelopathy: Secondary | ICD-10-CM | POA: Diagnosis not present

## 2017-05-25 DIAGNOSIS — M542 Cervicalgia: Secondary | ICD-10-CM | POA: Diagnosis not present

## 2017-05-25 DIAGNOSIS — M50222 Other cervical disc displacement at C5-C6 level: Secondary | ICD-10-CM | POA: Diagnosis not present

## 2017-05-25 DIAGNOSIS — F329 Major depressive disorder, single episode, unspecified: Secondary | ICD-10-CM | POA: Diagnosis not present

## 2017-05-25 DIAGNOSIS — M4712 Other spondylosis with myelopathy, cervical region: Secondary | ICD-10-CM | POA: Diagnosis not present

## 2017-05-26 DIAGNOSIS — F17210 Nicotine dependence, cigarettes, uncomplicated: Secondary | ICD-10-CM | POA: Diagnosis not present

## 2017-05-26 DIAGNOSIS — M4712 Other spondylosis with myelopathy, cervical region: Secondary | ICD-10-CM | POA: Diagnosis not present

## 2017-05-26 DIAGNOSIS — M4322 Fusion of spine, cervical region: Secondary | ICD-10-CM | POA: Diagnosis not present

## 2017-05-26 DIAGNOSIS — I10 Essential (primary) hypertension: Secondary | ICD-10-CM | POA: Diagnosis not present

## 2017-05-26 DIAGNOSIS — M50123 Cervical disc disorder at C6-C7 level with radiculopathy: Secondary | ICD-10-CM | POA: Diagnosis not present

## 2017-05-26 DIAGNOSIS — M4802 Spinal stenosis, cervical region: Secondary | ICD-10-CM | POA: Diagnosis not present

## 2017-05-26 DIAGNOSIS — M50023 Cervical disc disorder at C6-C7 level with myelopathy: Secondary | ICD-10-CM | POA: Diagnosis not present

## 2017-05-26 DIAGNOSIS — G4733 Obstructive sleep apnea (adult) (pediatric): Secondary | ICD-10-CM | POA: Diagnosis not present

## 2017-05-26 DIAGNOSIS — Z8673 Personal history of transient ischemic attack (TIA), and cerebral infarction without residual deficits: Secondary | ICD-10-CM | POA: Diagnosis not present

## 2017-05-26 DIAGNOSIS — E039 Hypothyroidism, unspecified: Secondary | ICD-10-CM | POA: Diagnosis not present

## 2017-05-26 DIAGNOSIS — M4722 Other spondylosis with radiculopathy, cervical region: Secondary | ICD-10-CM | POA: Diagnosis not present

## 2017-05-26 DIAGNOSIS — F329 Major depressive disorder, single episode, unspecified: Secondary | ICD-10-CM | POA: Diagnosis not present

## 2017-06-11 DIAGNOSIS — G4733 Obstructive sleep apnea (adult) (pediatric): Secondary | ICD-10-CM | POA: Diagnosis not present

## 2017-06-16 DIAGNOSIS — Z9181 History of falling: Secondary | ICD-10-CM | POA: Diagnosis not present

## 2017-06-16 DIAGNOSIS — F17210 Nicotine dependence, cigarettes, uncomplicated: Secondary | ICD-10-CM | POA: Diagnosis not present

## 2017-06-16 DIAGNOSIS — J4520 Mild intermittent asthma, uncomplicated: Secondary | ICD-10-CM | POA: Diagnosis not present

## 2017-06-16 DIAGNOSIS — R5383 Other fatigue: Secondary | ICD-10-CM | POA: Diagnosis not present

## 2017-06-16 DIAGNOSIS — M659 Synovitis and tenosynovitis, unspecified: Secondary | ICD-10-CM | POA: Diagnosis not present

## 2017-06-16 DIAGNOSIS — Z23 Encounter for immunization: Secondary | ICD-10-CM | POA: Diagnosis not present

## 2017-06-16 DIAGNOSIS — G4733 Obstructive sleep apnea (adult) (pediatric): Secondary | ICD-10-CM | POA: Diagnosis not present

## 2017-06-16 DIAGNOSIS — S060X9A Concussion with loss of consciousness of unspecified duration, initial encounter: Secondary | ICD-10-CM | POA: Diagnosis not present

## 2017-06-19 DIAGNOSIS — G4733 Obstructive sleep apnea (adult) (pediatric): Secondary | ICD-10-CM | POA: Diagnosis not present

## 2017-06-23 DIAGNOSIS — M961 Postlaminectomy syndrome, not elsewhere classified: Secondary | ICD-10-CM | POA: Diagnosis not present

## 2017-07-07 DIAGNOSIS — G5621 Lesion of ulnar nerve, right upper limb: Secondary | ICD-10-CM | POA: Diagnosis not present

## 2017-07-07 DIAGNOSIS — G5601 Carpal tunnel syndrome, right upper limb: Secondary | ICD-10-CM | POA: Diagnosis not present

## 2017-07-11 DIAGNOSIS — G4733 Obstructive sleep apnea (adult) (pediatric): Secondary | ICD-10-CM | POA: Diagnosis not present

## 2017-07-22 DIAGNOSIS — L0231 Cutaneous abscess of buttock: Secondary | ICD-10-CM | POA: Diagnosis not present

## 2017-07-22 DIAGNOSIS — Z79899 Other long term (current) drug therapy: Secondary | ICD-10-CM | POA: Diagnosis not present

## 2017-07-22 DIAGNOSIS — E039 Hypothyroidism, unspecified: Secondary | ICD-10-CM | POA: Diagnosis not present

## 2017-07-22 DIAGNOSIS — E119 Type 2 diabetes mellitus without complications: Secondary | ICD-10-CM | POA: Diagnosis not present

## 2017-07-22 DIAGNOSIS — I10 Essential (primary) hypertension: Secondary | ICD-10-CM | POA: Diagnosis not present

## 2017-07-22 DIAGNOSIS — F17210 Nicotine dependence, cigarettes, uncomplicated: Secondary | ICD-10-CM | POA: Diagnosis not present

## 2017-07-22 DIAGNOSIS — Z6839 Body mass index (BMI) 39.0-39.9, adult: Secondary | ICD-10-CM | POA: Diagnosis not present

## 2017-07-22 DIAGNOSIS — G4733 Obstructive sleep apnea (adult) (pediatric): Secondary | ICD-10-CM | POA: Diagnosis not present

## 2017-07-22 DIAGNOSIS — Z7982 Long term (current) use of aspirin: Secondary | ICD-10-CM | POA: Diagnosis not present

## 2017-07-22 DIAGNOSIS — Z8673 Personal history of transient ischemic attack (TIA), and cerebral infarction without residual deficits: Secondary | ICD-10-CM | POA: Diagnosis not present

## 2017-07-22 DIAGNOSIS — Z882 Allergy status to sulfonamides status: Secondary | ICD-10-CM | POA: Diagnosis not present

## 2017-07-24 DIAGNOSIS — A4902 Methicillin resistant Staphylococcus aureus infection, unspecified site: Secondary | ICD-10-CM | POA: Diagnosis not present

## 2017-07-24 DIAGNOSIS — Z6841 Body Mass Index (BMI) 40.0 and over, adult: Secondary | ICD-10-CM | POA: Diagnosis not present

## 2017-07-24 DIAGNOSIS — K611 Rectal abscess: Secondary | ICD-10-CM | POA: Diagnosis not present

## 2017-07-27 DIAGNOSIS — I10 Essential (primary) hypertension: Secondary | ICD-10-CM | POA: Diagnosis not present

## 2017-07-27 DIAGNOSIS — E039 Hypothyroidism, unspecified: Secondary | ICD-10-CM | POA: Diagnosis not present

## 2017-07-27 DIAGNOSIS — K611 Rectal abscess: Secondary | ICD-10-CM | POA: Diagnosis not present

## 2017-07-27 DIAGNOSIS — E119 Type 2 diabetes mellitus without complications: Secondary | ICD-10-CM | POA: Diagnosis not present

## 2017-07-27 DIAGNOSIS — E782 Mixed hyperlipidemia: Secondary | ICD-10-CM | POA: Diagnosis not present

## 2017-07-31 DIAGNOSIS — A4902 Methicillin resistant Staphylococcus aureus infection, unspecified site: Secondary | ICD-10-CM | POA: Diagnosis not present

## 2017-07-31 DIAGNOSIS — Z6841 Body Mass Index (BMI) 40.0 and over, adult: Secondary | ICD-10-CM | POA: Diagnosis not present

## 2017-07-31 DIAGNOSIS — K611 Rectal abscess: Secondary | ICD-10-CM | POA: Diagnosis not present

## 2017-08-05 DIAGNOSIS — Z6841 Body Mass Index (BMI) 40.0 and over, adult: Secondary | ICD-10-CM | POA: Diagnosis not present

## 2017-08-05 DIAGNOSIS — F3130 Bipolar disorder, current episode depressed, mild or moderate severity, unspecified: Secondary | ICD-10-CM | POA: Diagnosis not present

## 2017-08-05 DIAGNOSIS — K611 Rectal abscess: Secondary | ICD-10-CM | POA: Diagnosis not present

## 2017-08-11 DIAGNOSIS — G4733 Obstructive sleep apnea (adult) (pediatric): Secondary | ICD-10-CM | POA: Diagnosis not present

## 2017-08-18 DIAGNOSIS — M4802 Spinal stenosis, cervical region: Secondary | ICD-10-CM | POA: Diagnosis not present

## 2017-08-24 DIAGNOSIS — G4733 Obstructive sleep apnea (adult) (pediatric): Secondary | ICD-10-CM | POA: Diagnosis not present

## 2017-09-01 ENCOUNTER — Other Ambulatory Visit: Payer: Self-pay | Admitting: Gastroenterology

## 2017-09-02 ENCOUNTER — Encounter (HOSPITAL_COMMUNITY): Payer: Self-pay | Admitting: *Deleted

## 2017-09-02 ENCOUNTER — Other Ambulatory Visit: Payer: Self-pay

## 2017-09-02 NOTE — Progress Notes (Signed)
Pt denies SOB, chest pain, and being under the care of a cardiologist. Pt made aware to stop taking Aspirin, vitamins, fish oil and herbal medications. Do not take any NSAIDs ie: Ibuprofen, Advil, Naproxen (Aleve), Motrin, BC and Goody Powder. Pt verbalized understanding of all pre-op instructions.

## 2017-09-03 ENCOUNTER — Ambulatory Visit (HOSPITAL_COMMUNITY): Payer: BLUE CROSS/BLUE SHIELD | Admitting: Certified Registered Nurse Anesthetist

## 2017-09-03 ENCOUNTER — Encounter (HOSPITAL_COMMUNITY): Payer: Self-pay | Admitting: *Deleted

## 2017-09-03 ENCOUNTER — Ambulatory Visit (HOSPITAL_COMMUNITY)
Admission: RE | Admit: 2017-09-03 | Discharge: 2017-09-03 | Disposition: A | Discharge status: Home / Self Care | Payer: BLUE CROSS/BLUE SHIELD | Source: Ambulatory Visit | Attending: Gastroenterology | Admitting: Gastroenterology

## 2017-09-03 ENCOUNTER — Encounter (HOSPITAL_COMMUNITY)
Admission: RE | Disposition: A | Discharge status: Home / Self Care | Payer: Self-pay | Source: Ambulatory Visit | Attending: Gastroenterology

## 2017-09-03 DIAGNOSIS — D122 Benign neoplasm of ascending colon: Secondary | ICD-10-CM | POA: Insufficient documentation

## 2017-09-03 DIAGNOSIS — F4310 Post-traumatic stress disorder, unspecified: Secondary | ICD-10-CM | POA: Diagnosis not present

## 2017-09-03 DIAGNOSIS — G4733 Obstructive sleep apnea (adult) (pediatric): Secondary | ICD-10-CM | POA: Diagnosis not present

## 2017-09-03 DIAGNOSIS — Z79899 Other long term (current) drug therapy: Secondary | ICD-10-CM | POA: Diagnosis not present

## 2017-09-03 DIAGNOSIS — F418 Other specified anxiety disorders: Secondary | ICD-10-CM | POA: Diagnosis not present

## 2017-09-03 DIAGNOSIS — M1990 Unspecified osteoarthritis, unspecified site: Secondary | ICD-10-CM | POA: Diagnosis not present

## 2017-09-03 DIAGNOSIS — J45909 Unspecified asthma, uncomplicated: Secondary | ICD-10-CM | POA: Diagnosis not present

## 2017-09-03 DIAGNOSIS — E119 Type 2 diabetes mellitus without complications: Secondary | ICD-10-CM | POA: Diagnosis not present

## 2017-09-03 DIAGNOSIS — Z888 Allergy status to other drugs, medicaments and biological substances status: Secondary | ICD-10-CM | POA: Insufficient documentation

## 2017-09-03 DIAGNOSIS — E039 Hypothyroidism, unspecified: Secondary | ICD-10-CM | POA: Insufficient documentation

## 2017-09-03 DIAGNOSIS — Z9989 Dependence on other enabling machines and devices: Secondary | ICD-10-CM | POA: Insufficient documentation

## 2017-09-03 DIAGNOSIS — F17210 Nicotine dependence, cigarettes, uncomplicated: Secondary | ICD-10-CM | POA: Diagnosis not present

## 2017-09-03 DIAGNOSIS — Z882 Allergy status to sulfonamides status: Secondary | ICD-10-CM | POA: Insufficient documentation

## 2017-09-03 DIAGNOSIS — F419 Anxiety disorder, unspecified: Secondary | ICD-10-CM | POA: Diagnosis not present

## 2017-09-03 DIAGNOSIS — I10 Essential (primary) hypertension: Secondary | ICD-10-CM | POA: Diagnosis not present

## 2017-09-03 DIAGNOSIS — Z8673 Personal history of transient ischemic attack (TIA), and cerebral infarction without residual deficits: Secondary | ICD-10-CM | POA: Diagnosis not present

## 2017-09-03 DIAGNOSIS — F319 Bipolar disorder, unspecified: Secondary | ICD-10-CM | POA: Diagnosis not present

## 2017-09-03 DIAGNOSIS — Z1211 Encounter for screening for malignant neoplasm of colon: Secondary | ICD-10-CM | POA: Insufficient documentation

## 2017-09-03 DIAGNOSIS — Z86010 Personal history of colonic polyps: Secondary | ICD-10-CM | POA: Insufficient documentation

## 2017-09-03 DIAGNOSIS — Z96653 Presence of artificial knee joint, bilateral: Secondary | ICD-10-CM | POA: Insufficient documentation

## 2017-09-03 HISTORY — DX: Pneumonia, unspecified organism: J18.9

## 2017-09-03 HISTORY — PX: COLONOSCOPY WITH PROPOFOL: SHX5780

## 2017-09-03 HISTORY — DX: Unspecified asthma, uncomplicated: J45.909

## 2017-09-03 SURGERY — COLONOSCOPY WITH PROPOFOL
Anesthesia: Monitor Anesthesia Care

## 2017-09-03 MED ORDER — PROPOFOL 10 MG/ML IV BOLUS
INTRAVENOUS | Status: DC | PRN
  Administered 2017-09-03: 30 mg via INTRAVENOUS

## 2017-09-03 MED ORDER — SODIUM CHLORIDE 0.9 % IV SOLN: INTRAVENOUS | Status: DC

## 2017-09-03 MED ORDER — PROPOFOL 500 MG/50ML IV EMUL
INTRAVENOUS | Status: DC | PRN
  Administered 2017-09-03: 75 ug/kg/min via INTRAVENOUS

## 2017-09-03 MED ORDER — LACTATED RINGERS IV SOLN
INTRAVENOUS | Status: DC | PRN
  Administered 2017-09-03: 09:00:00 via INTRAVENOUS

## 2017-09-03 MED ORDER — ONDANSETRON HCL 4 MG/2ML IJ SOLN
INTRAMUSCULAR | Status: DC | PRN
  Administered 2017-09-03: 4 mg via INTRAVENOUS

## 2017-09-03 SURGICAL SUPPLY — 21 items

## 2017-09-03 NOTE — Discharge Instructions (Addendum)
YOU HAD AN ENDOSCOPIC PROCEDURE TODAY: Refer to the procedure report and other information in the discharge instructions given to you for any specific questions about what was found during the examination. If this information does not answer your questions, please call [facility] office at [phone] to clarify.   YOU SHOULD EXPECT: Some feelings of bloating in the abdomen. Passage of more gas than usual. Walking can help get rid of the air that was put into your GI tract during the procedure and reduce the bloating. If you had a lower endoscopy (such as a colonoscopy or flexible sigmoidoscopy) you may notice spotting of blood in your stool or on the toilet paper. Some abdominal soreness may be present for a day or two, also.  DIET: Your first meal following the procedure should be a light meal and then it is ok to progress to your normal diet. A half-sandwich or bowl of soup is an example of a good first meal. Heavy or fried foods are harder to digest and may make you feel nauseous or bloated. Drink plenty of fluids but you should avoid alcoholic beverages for 24 hours. If you had a esophageal dilation, please see attached instructions for diet.   ACTIVITY: Your care partner should take you home directly after the procedure. You should plan to take it easy, moving slowly for the rest of the day. You can resume normal activity the day after the procedure however YOU SHOULD NOT DRIVE, use power tools, machinery or perform tasks that involve climbing or major physical exertion for 24 hours (because of the sedation medicines used during the test).   SYMPTOMS TO REPORT IMMEDIATELY: A gastroenterologist can be reached at any hour. Please call [phone]  for any of the following symptoms:  Following lower endoscopy (colonoscopy, flexible sigmoidoscopy) Excessive amounts of blood in the stool  Significant tenderness, worsening of abdominal pains  Swelling of the abdomen that is new, acute  Fever of 100 or  higher   FOLLOW UP:  If any biopsies were taken you will be contacted by phone or by letter within the next 1-3 weeks. Call [phone]  if you have not heard about the biopsies in 3 weeks.  Please also call with any specific questions about appointments or follow up tests. No aspirin, ibuprofen or other NSAID medications for 5 days. Office will send note or call when pathology results are obtained. Colonoscopy will be repeated based on the pathology results.

## 2017-09-03 NOTE — Anesthesia Preprocedure Evaluation (Addendum)
Anesthesia Evaluation  Patient identified by MRN, date of birth, ID band Patient awake    Reviewed: Allergy & Precautions, H&P , Patient's Chart, lab work & pertinent test results  Airway Mallampati: III  TM Distance: >3 FB Neck ROM: full    Dental  (+) Edentulous Upper   Pulmonary Current Smoker,    breath sounds clear to auscultation       Cardiovascular hypertension,  Rhythm:regular Rate:Normal     Neuro/Psych    GI/Hepatic   Endo/Other  diabetes  Renal/GU      Musculoskeletal   Abdominal   Peds  Hematology   Anesthesia Other Findings       Reproductive/Obstetrics                            Anesthesia Physical Anesthesia Plan  ASA: III  Anesthesia Plan: MAC   Post-op Pain Management:    Induction: Intravenous  PONV Risk Score and Plan: Treatment may vary due to age or medical condition  Airway Management Planned: Mask and Natural Airway  Additional Equipment:   Intra-op Plan:   Post-operative Plan:   Informed Consent:   Plan Discussed with:   Anesthesia Plan Comments:         Anesthesia Quick Evaluation

## 2017-09-03 NOTE — Transfer of Care (Signed)
Immediate Anesthesia Transfer of Care Note  Patient: Rodney Leach  Procedure(s) Performed: COLONOSCOPY WITH PROPOFOL (N/A ) HOT HEMOSTASIS (ARGON PLASMA COAGULATION/BICAP) (N/A )  Patient Location: PACU  Anesthesia Type:MAC  Level of Consciousness: awake, alert  and oriented  Airway & Oxygen Therapy: Patient Spontanous Breathing  Post-op Assessment: Report given to RN, Post -op Vital signs reviewed and stable and Patient moving all extremities X 4  Post vital signs: Reviewed and stable  Last Vitals:  Vitals:   09/03/17 0851 09/03/17 1042  BP: (!) 188/86 (!) 165/90  Pulse: 76 77  Resp: 14 18  Temp: 36.9 C 36.8 C  SpO2: 96% 97%    Last Pain:  Vitals:   09/03/17 1042  TempSrc: Oral         Complications: No apparent anesthesia complications

## 2017-09-03 NOTE — Op Note (Signed)
[facility] Patient Name: Rodney Leach Procedure Date : 09/03/2017 MRN: [id number] Attending MD: Nancy Fetter Dr., MD Date of Birth: 11-Nov-1960 CSN: [id number] Age: 57 Admit Type: Outpatient Procedure:                Colonoscopy Indications:              High risk colon cancer surveillance: Personal                            history of colonic polyps, Last colonoscopy: 2017 Providers:                Jeneen Rinks L. Hadja Harral Dr., MD, Angus Seller, Janie                            Billups, Technician, Rejeana Brock, CRNA Referring MD:              Medicines:                Monitored Anesthesia Care Complications:            No immediate complications. Estimated Blood Loss:     Estimated blood loss: none. Procedure:                Pre-Anesthesia Assessment:                           - Prior to the procedure, a History and Physical                            was performed, and patient medications and                            allergies were reviewed. The patient's tolerance of                            previous anesthesia was also reviewed. The risks                            and benefits of the procedure and the sedation                            options and risks were discussed with the patient.                            All questions were answered, and informed consent                            was obtained. Prior Anticoagulants: The patient has                            taken no previous anticoagulant or antiplatelet                            agents. ASA Grade Assessment: III - A patient with  severe systemic disease. After reviewing the risks                            and benefits, the patient was deemed in                            satisfactory condition to undergo the procedure.                           After obtaining informed consent, the colonoscope                            was passed under direct vision. Throughout the                      procedure, the patient's blood pressure, pulse, and                            oxygen saturations were monitored continuously. The                            EC-[id number] ([id number]) scope was introduced through                            the anus and advanced to the the cecum, identified                            by appendiceal orifice and ileocecal valve. The                            colonoscopy was performed without difficulty. The                            patient tolerated the procedure well. The quality                            of the bowel preparation was good. The ileocecal                            valve, appendiceal orifice, and rectum were                            photographed. Scope In: 10:06:13 AM Scope Out: 10:34:34 AM Scope Withdrawal Time: 0 hours 20 minutes 30 seconds  Total Procedure Duration: 0 hours 28 minutes 21 seconds  Findings:      The perianal and digital rectal examinations were normal.      A 7 mm polyp was found in the proximal ascending colon. The polyp was       sessile. The polyp was removed with a hot snare. Resection and retrieval       were complete.      The retroflexed view of the distal rectum and anal verge was normal and       showed no anal or rectal abnormalities.      The exam was otherwise without abnormality. Impression:               -  One 7 mm polyp in the proximal ascending colon,                            removed with a hot snare. Resected and retrieved.                           - The distal rectum and anal verge are normal on                            retroflexion view.                           - The examination was otherwise normal.                           - Personal history of colonic polyps. Moderate Sedation:      See anesthesia note, no moderate sedation. Recommendation:           - Patient has a contact number available for                            emergencies. The signs and symptoms of potential                             delayed complications were discussed with the                            patient. Return to normal activities tomorrow.                            Written discharge instructions were provided to the                            patient.                           - Resume previous diet.                           - Continue present medications.                           - No aspirin, ibuprofen, naproxen, or other                            non-steroidal anti-inflammatory drugs for 5 days                            after polyp removal.                           - Repeat colonoscopy for surveillance. Procedure Code(s):        --- Professional ---                           [phone], Colonoscopy, flexible; with removal of  tumor(s), polyp(s), or other lesion(s) by snare                            technique Diagnosis Code(s):        --- Professional ---                           D12.2, Benign neoplasm of ascending colon CPT copyright 2016 American Medical Association. All rights reserved. The codes documented in this report are preliminary and upon coder review may  be revised to meet current compliance requirements. Nancy Fetter Dr., MD 09/03/2017 10:41:26 AM This report has been signed electronically. Number of Addenda: 0

## 2017-09-03 NOTE — H&P (Signed)
Subjective:   Patient is a 57 y.o. male presents with a history of previous: polyp last colonoscopy 1117 with a small serrated adenoma that was biopsied and ascending colon. Another serrated adenoma had been removed from the descending colon. He somewhat late having repeat colonoscopy but we're going to do the colonoscopy to evaluate the polyp and ascending colon with the APC on standby.. Procedure including risks and benefits discussed in office.  Patient Active Problem List   Diagnosis Date Noted  . Slurred speech 01/14/2013  . Depression 09/03/2012  . Anxiety 09/03/2012   Past Medical History:  Diagnosis Date  . Arthritis   . Asthma   . Bipolar affective ([place])   . Complication of anesthesia    has woken up during surgery before   . Diabetes mellitus without complication ([place])   . Headache(784.0)   . Hypertension   . Hypothyroidism   . Pneumonia   . PTSD (post-traumatic stress disorder)   . Sleep apnea    CPAP  . Stroke [facility])    "light" stroke    Past Surgical History:  Procedure Laterality Date  . BACK SURGERY     fusion L5/S1  . HAND SURGERY     R & L   . HERNIA REPAIR     umbilical  . MULTIPLE TOOTH EXTRACTIONS    . NOSE SURGERY     fracture repair  . TOTAL KNEE ARTHROPLASTY  2012   Right  . TOTAL KNEE ARTHROPLASTY  04/07/2012   Procedure: TOTAL KNEE ARTHROPLASTY;  Surgeon: Ninetta Lights, MD;  Location: [place];  Service: Orthopedics;  Laterality: Left;  left total knee arthroplasty    Medications Prior to Admission  Medication Sig Dispense Refill Last Dose  . b complex vitamins tablet Take 1 tablet by mouth daily.   Past Week at Unknown time  . BREO ELLIPTA 100-25 MCG/INH AEPB Inhale 1 puff into the lungs daily.  3 09/02/2017 at Unknown time  . fluticasone (FLONASE) 50 MCG/ACT nasal spray Place 2 sprays into both nostrils 3 (three) times daily as needed for allergies.  5 Past Week at Unknown time  . gabapentin (NEURONTIN) 400 MG capsule Take 1 capsule (400 mg  total) by mouth 4 (four) times daily. (Patient taking differently: Take 800 mg by mouth See admin instructions. Take 1600 mg by mouth in the morning, take 800 mg by mouth at lunch and take 800 mg by mouth in the evening) 120 capsule 0 09/03/2017 at Unknown time  . levothyroxine (SYNTHROID, LEVOTHROID) 175 MCG tablet Take 1 tablet (175 mcg total) by mouth daily. 30 tablet 0 09/03/2017 at Unknown time  . losartan (COZAAR) 100 MG tablet Take 100 mg by mouth daily.  1 09/02/2017 at Unknown time  . MOVANTIK 25 MG TABS tablet Take 25 mg by mouth daily.  0 09/02/2017 at Unknown time  . Oxycodone HCl 10 MG TABS Take 10 mg by mouth every 4 (four) hours.   09/02/2017 at Unknown time  . PROAIR HFA 108 (90 Base) MCG/ACT inhaler Inhale 2 puffs into the lungs every 4 (four) hours as needed for shortness of breath or wheezing.  3 Past Week at Unknown time  . sertraline (ZOLOFT) 100 MG tablet Take 100 mg by mouth 2 (two) times daily.   09/02/2017 at Unknown time  . traZODone (DESYREL) 100 MG tablet Take 200 mg by mouth at bedtime.   09/02/2017 at Unknown time  . Vitamin D, Ergocalciferol, (DRISDOL) 50000 units CAPS capsule Take 50,000 Units  by mouth every Saturday.  2 Past Week at Unknown time  . HYDROcodone-acetaminophen (NORCO/VICODIN) 5-325 MG per tablet Take 1-2 tablets by mouth every 4 (four) hours as needed for moderate pain or severe pain. (Patient not taking: Reported on 09/01/2017) 15 tablet 0 Completed Course at Unknown time  . QUEtiapine (SEROQUEL XR) 300 MG 24 hr tablet Take 1 tablet (300 mg total) by mouth daily at 8 pm. (Patient not taking: Reported on 09/01/2017) 30 tablet 0 Not Taking at Unknown time  . zolpidem (AMBIEN) 5 MG tablet Take 1 tablet (5 mg total) by mouth at bedtime as needed for sleep. (Patient not taking: Reported on 09/01/2017) 30 tablet 0 Not Taking at Unknown time   Allergies  Allergen Reactions  . Adhesive [Tape] Rash  . Sulfa Antibiotics Rash  . Sulfamethoxazole Rash    Social History    Tobacco Use  . Smoking status: Current Every Day Smoker    Packs/day: 0.50    Types: Cigarettes  . Smokeless tobacco: Former Systems developer    Types: Chew  Substance Use Topics  . Alcohol use: Yes    Comment: occasionally    Family History  Problem Relation Age of Onset  . Hypertension Mother   . Diabetes Mother   . Hypertension Father      Objective:   Patient Vitals for the past 8 hrs:  BP Temp Temp src Pulse Resp SpO2 Height Weight  09/03/17 0851 (!) 188/86 98.4 F (36.9 C) Oral 76 14 96 % 6\' 5"  (1.956 m) (!) 150.1 kg (331 lb)   No intake/output data recorded. No intake/output data recorded.   See MD Preop evaluation      Assessment:   1. History of colon polyps with serrated adenoma in ascending colon 2. History of stroke 3. OSA 4 type II diabetes control diet 5. PTSD  Plan:   Will go ahead with repeat colonoscopy with the APC on standby in case the polyp in ascending colon needs to be fulgerated. As discussed in detail with the patient in the office.

## 2017-09-06 ENCOUNTER — Encounter (HOSPITAL_COMMUNITY): Payer: Self-pay | Admitting: Gastroenterology

## 2017-09-08 NOTE — Anesthesia Postprocedure Evaluation (Signed)
Anesthesia Post Note  Patient: Rodney Leach  Procedure(s) Performed: COLONOSCOPY WITH PROPOFOL (N/A )     Patient location during evaluation: PACU Anesthesia Type: MAC Level of consciousness: awake and alert Pain management: pain level controlled Vital Signs Assessment: post-procedure vital signs reviewed and stable Respiratory status: spontaneous breathing, nonlabored ventilation, respiratory function stable and patient connected to nasal cannula oxygen Cardiovascular status: stable and blood pressure returned to baseline Postop Assessment: no apparent nausea or vomiting Anesthetic complications: no    Last Vitals:  Vitals:   09/03/17 1050 09/03/17 1100  BP: (!) 192/98 (!) 169/77  Pulse: 64 70  Resp: 12 12  Temp:    SpO2: 99% 100%    Last Pain:  Vitals:   09/03/17 1042  TempSrc: Oral                 [name] [place]

## 2017-09-11 DIAGNOSIS — G4733 Obstructive sleep apnea (adult) (pediatric): Secondary | ICD-10-CM | POA: Diagnosis not present

## 2017-09-14 DIAGNOSIS — I10 Essential (primary) hypertension: Secondary | ICD-10-CM | POA: Diagnosis not present

## 2017-09-14 DIAGNOSIS — E119 Type 2 diabetes mellitus without complications: Secondary | ICD-10-CM | POA: Diagnosis not present

## 2017-09-14 DIAGNOSIS — E039 Hypothyroidism, unspecified: Secondary | ICD-10-CM | POA: Diagnosis not present

## 2017-09-14 DIAGNOSIS — Z1331 Encounter for screening for depression: Secondary | ICD-10-CM | POA: Diagnosis not present

## 2017-09-18 DIAGNOSIS — M50223 Other cervical disc displacement at C6-C7 level: Secondary | ICD-10-CM | POA: Diagnosis not present

## 2017-09-18 DIAGNOSIS — M4802 Spinal stenosis, cervical region: Secondary | ICD-10-CM | POA: Diagnosis not present

## 2017-09-18 DIAGNOSIS — M50222 Other cervical disc displacement at C5-C6 level: Secondary | ICD-10-CM | POA: Diagnosis not present

## 2017-09-22 DIAGNOSIS — E119 Type 2 diabetes mellitus without complications: Secondary | ICD-10-CM | POA: Diagnosis not present

## 2017-09-22 DIAGNOSIS — E782 Mixed hyperlipidemia: Secondary | ICD-10-CM | POA: Diagnosis not present

## 2017-09-22 DIAGNOSIS — I10 Essential (primary) hypertension: Secondary | ICD-10-CM | POA: Diagnosis not present

## 2017-09-22 DIAGNOSIS — E039 Hypothyroidism, unspecified: Secondary | ICD-10-CM | POA: Diagnosis not present

## 2017-10-09 DIAGNOSIS — G4733 Obstructive sleep apnea (adult) (pediatric): Secondary | ICD-10-CM | POA: Diagnosis not present

## 2017-10-13 DIAGNOSIS — W01198A Fall on same level from slipping, tripping and stumbling with subsequent striking against other object, initial encounter: Secondary | ICD-10-CM | POA: Diagnosis not present

## 2017-10-13 DIAGNOSIS — Z8673 Personal history of transient ischemic attack (TIA), and cerebral infarction without residual deficits: Secondary | ICD-10-CM | POA: Diagnosis not present

## 2017-10-13 DIAGNOSIS — M549 Dorsalgia, unspecified: Secondary | ICD-10-CM | POA: Diagnosis not present

## 2017-10-13 DIAGNOSIS — R0600 Dyspnea, unspecified: Secondary | ICD-10-CM | POA: Diagnosis not present

## 2017-10-13 DIAGNOSIS — M940 Chondrocostal junction syndrome [Tietze]: Secondary | ICD-10-CM | POA: Diagnosis not present

## 2017-10-13 DIAGNOSIS — I10 Essential (primary) hypertension: Secondary | ICD-10-CM | POA: Diagnosis not present

## 2017-10-13 DIAGNOSIS — E119 Type 2 diabetes mellitus without complications: Secondary | ICD-10-CM | POA: Diagnosis not present

## 2017-10-13 DIAGNOSIS — R0781 Pleurodynia: Secondary | ICD-10-CM | POA: Diagnosis not present

## 2017-10-13 DIAGNOSIS — Z6839 Body mass index (BMI) 39.0-39.9, adult: Secondary | ICD-10-CM | POA: Diagnosis not present

## 2017-10-13 DIAGNOSIS — F17210 Nicotine dependence, cigarettes, uncomplicated: Secondary | ICD-10-CM | POA: Diagnosis not present

## 2017-10-16 DIAGNOSIS — M50222 Other cervical disc displacement at C5-C6 level: Secondary | ICD-10-CM | POA: Diagnosis not present

## 2017-10-16 DIAGNOSIS — M50223 Other cervical disc displacement at C6-C7 level: Secondary | ICD-10-CM | POA: Diagnosis not present

## 2017-10-16 DIAGNOSIS — M4802 Spinal stenosis, cervical region: Secondary | ICD-10-CM | POA: Diagnosis not present

## 2017-10-19 DIAGNOSIS — E663 Overweight: Secondary | ICD-10-CM | POA: Diagnosis not present

## 2017-10-19 DIAGNOSIS — Z6841 Body Mass Index (BMI) 40.0 and over, adult: Secondary | ICD-10-CM | POA: Diagnosis not present

## 2017-10-19 DIAGNOSIS — S20211S Contusion of right front wall of thorax, sequela: Secondary | ICD-10-CM | POA: Diagnosis not present

## 2017-10-19 DIAGNOSIS — E669 Obesity, unspecified: Secondary | ICD-10-CM | POA: Diagnosis not present

## 2017-10-21 DIAGNOSIS — G8918 Other acute postprocedural pain: Secondary | ICD-10-CM | POA: Diagnosis not present

## 2017-10-21 DIAGNOSIS — G5621 Lesion of ulnar nerve, right upper limb: Secondary | ICD-10-CM | POA: Diagnosis not present

## 2017-10-21 DIAGNOSIS — G5601 Carpal tunnel syndrome, right upper limb: Secondary | ICD-10-CM | POA: Insufficient documentation

## 2017-10-21 HISTORY — DX: Carpal tunnel syndrome, right upper limb: G56.01

## 2017-10-21 HISTORY — DX: Lesion of ulnar nerve, right upper limb: G56.21

## 2017-11-03 DIAGNOSIS — G5621 Lesion of ulnar nerve, right upper limb: Secondary | ICD-10-CM | POA: Diagnosis not present

## 2017-11-09 DIAGNOSIS — G4733 Obstructive sleep apnea (adult) (pediatric): Secondary | ICD-10-CM | POA: Diagnosis not present

## 2017-11-13 DIAGNOSIS — M50223 Other cervical disc displacement at C6-C7 level: Secondary | ICD-10-CM | POA: Diagnosis not present

## 2017-11-13 DIAGNOSIS — M50222 Other cervical disc displacement at C5-C6 level: Secondary | ICD-10-CM | POA: Diagnosis not present

## 2017-11-13 DIAGNOSIS — Z6837 Body mass index (BMI) 37.0-37.9, adult: Secondary | ICD-10-CM | POA: Diagnosis not present

## 2017-11-13 DIAGNOSIS — M4802 Spinal stenosis, cervical region: Secondary | ICD-10-CM | POA: Diagnosis not present

## 2017-11-26 DIAGNOSIS — K1370 Unspecified lesions of oral mucosa: Secondary | ICD-10-CM | POA: Diagnosis not present

## 2017-11-26 DIAGNOSIS — Z6841 Body Mass Index (BMI) 40.0 and over, adult: Secondary | ICD-10-CM | POA: Diagnosis not present

## 2017-11-26 DIAGNOSIS — F419 Anxiety disorder, unspecified: Secondary | ICD-10-CM | POA: Diagnosis not present

## 2017-11-26 DIAGNOSIS — F319 Bipolar disorder, unspecified: Secondary | ICD-10-CM | POA: Diagnosis not present

## 2017-12-03 DIAGNOSIS — G4733 Obstructive sleep apnea (adult) (pediatric): Secondary | ICD-10-CM | POA: Diagnosis not present

## 2017-12-14 DIAGNOSIS — F329 Major depressive disorder, single episode, unspecified: Secondary | ICD-10-CM | POA: Diagnosis not present

## 2018-01-30 DIAGNOSIS — F1020 Alcohol dependence, uncomplicated: Secondary | ICD-10-CM | POA: Diagnosis not present

## 2018-01-30 DIAGNOSIS — F339 Major depressive disorder, recurrent, unspecified: Secondary | ICD-10-CM

## 2018-01-30 DIAGNOSIS — F323 Major depressive disorder, single episode, severe with psychotic features: Secondary | ICD-10-CM | POA: Diagnosis not present

## 2018-01-30 DIAGNOSIS — R45851 Suicidal ideations: Secondary | ICD-10-CM | POA: Diagnosis not present

## 2018-01-30 DIAGNOSIS — F10239 Alcohol dependence with withdrawal, unspecified: Secondary | ICD-10-CM | POA: Diagnosis not present

## 2018-01-30 DIAGNOSIS — J40 Bronchitis, not specified as acute or chronic: Secondary | ICD-10-CM | POA: Diagnosis not present

## 2018-01-30 DIAGNOSIS — R05 Cough: Secondary | ICD-10-CM | POA: Diagnosis not present

## 2018-01-30 DIAGNOSIS — F333 Major depressive disorder, recurrent, severe with psychotic symptoms: Secondary | ICD-10-CM | POA: Diagnosis not present

## 2018-01-30 HISTORY — DX: Major depressive disorder, recurrent, unspecified: F33.9

## 2018-02-23 DIAGNOSIS — F419 Anxiety disorder, unspecified: Secondary | ICD-10-CM | POA: Diagnosis not present

## 2018-03-13 IMAGING — MR MR LUMBAR SPINE W/O CM
4 of 5 series · 24 of 48 positions shown · non-contrast
Comparison: Lumbar spine MRI 03/10/2014, lumbar spine radiograph
06/07/2014

CLINICAL DATA: Back pain radiating to left thigh. History of prior
lumbar fusion.

EXAM:
MRI LUMBAR SPINE WITHOUT CONTRAST
TECHNIQUE: Multiplanar, multisequence MR imaging of the lumbar spine was
performed. No intravenous contrast was administered.

[Series 3: T2 · sagittal · 4.0mm · 0.55mm/px · 6 of 15 slices shown (1 of 2)]
[im 1/15]
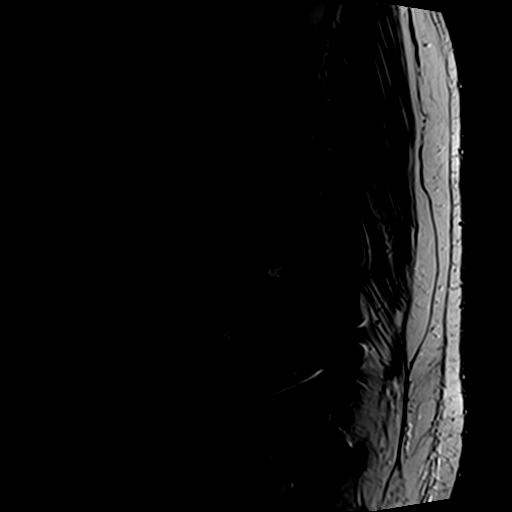
[im 3/15]
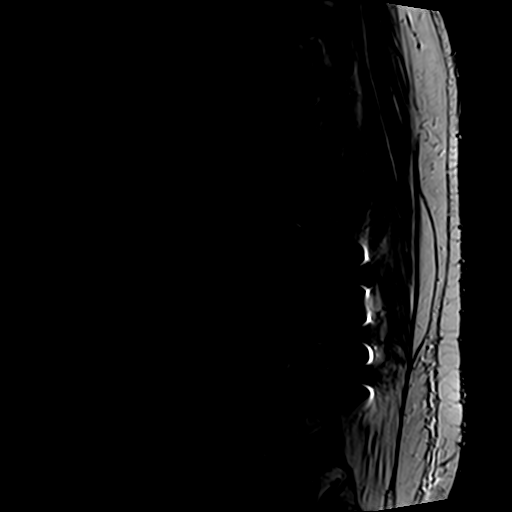
[im 6/15]
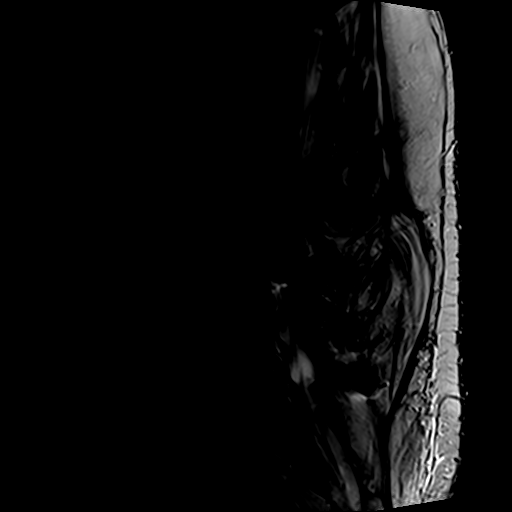
[im 9/15]
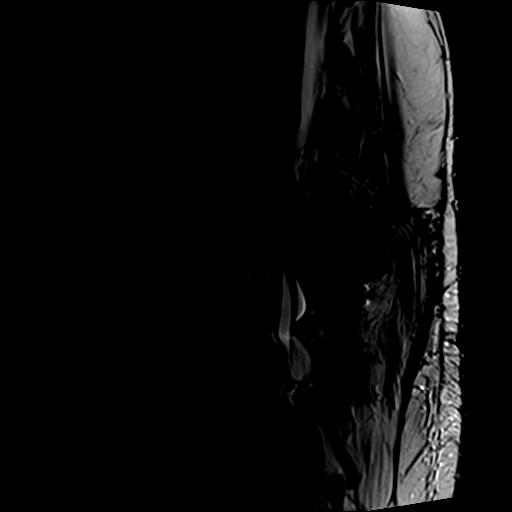
[im 12/15]
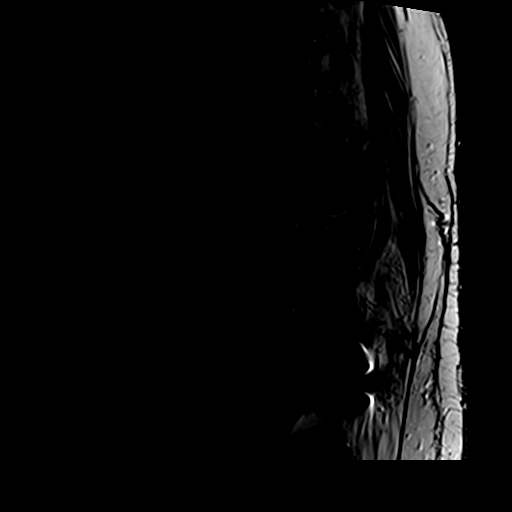
[im 15/15]
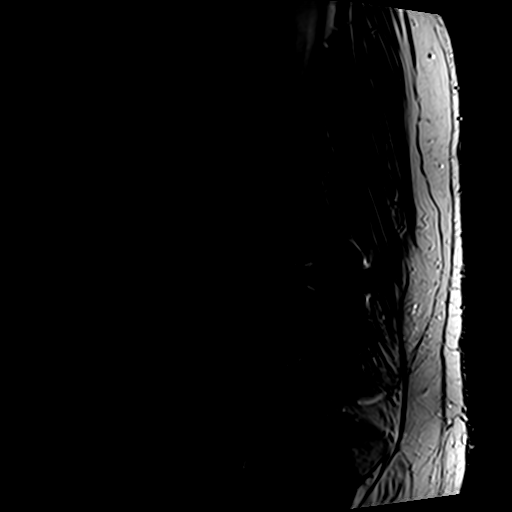

[Series 4: T1 · sagittal · 4.0mm · 0.55mm/px · 6 of 15 slices shown (1 of 2)]
[im 1/15]
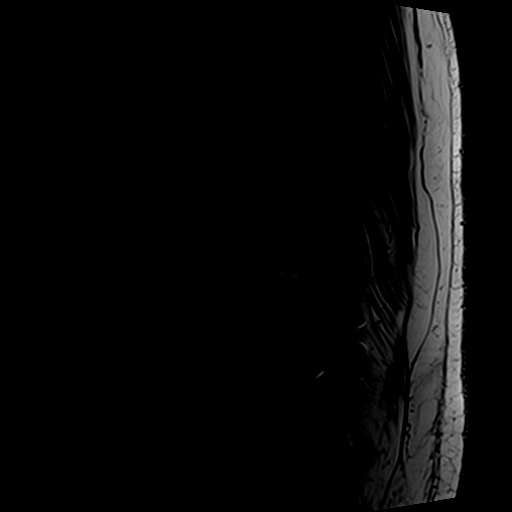
[im 3/15]
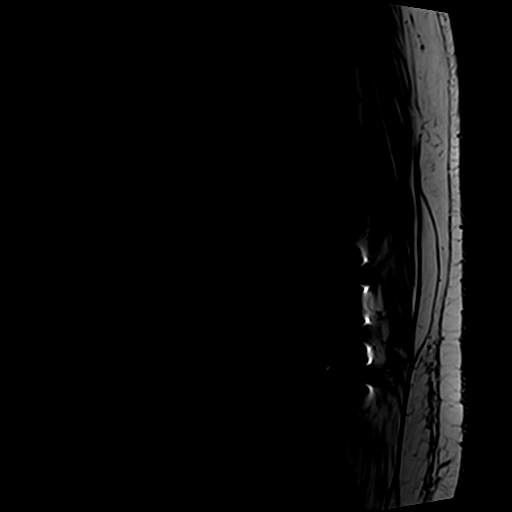
[im 6/15]
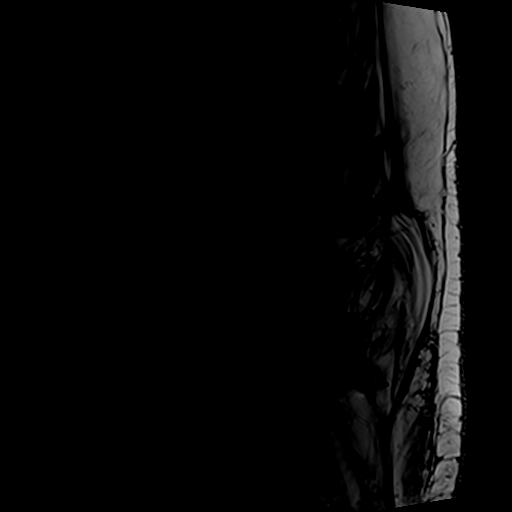
[im 9/15]
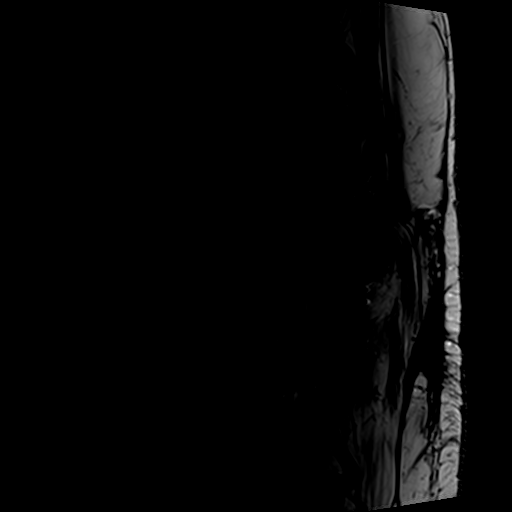
[im 12/15]
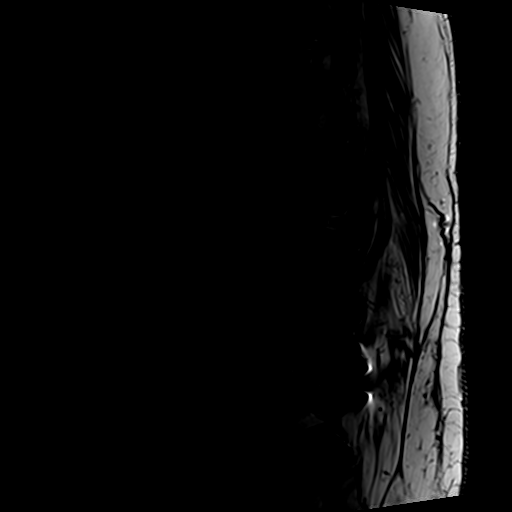
[im 15/15]
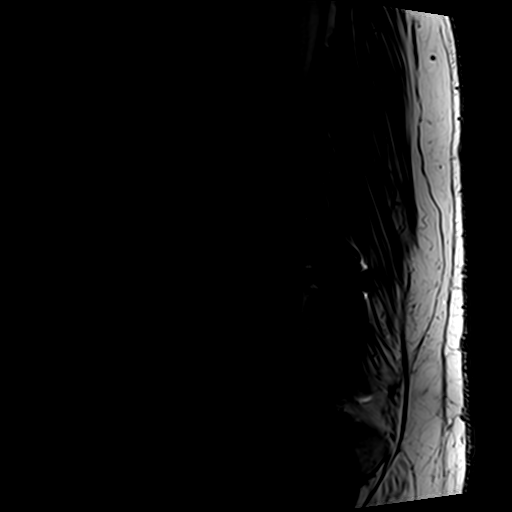

[Series 6: T2 · axial · 4.0mm · 0.78mm/px · z∈[-83,+139]mm · 9 of 41 slices shown (2 of 2)]
[im 1/41]
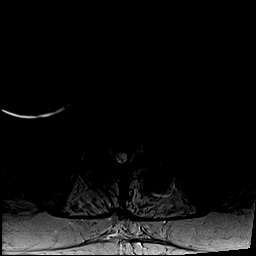
[im 6/41]
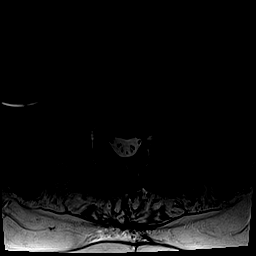
[im 12/41]
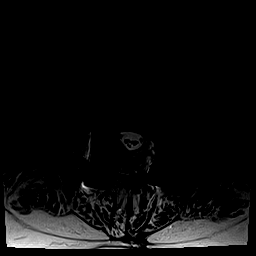
[im 18/41]
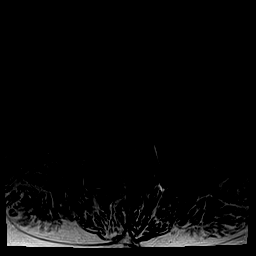
[im 21/41]
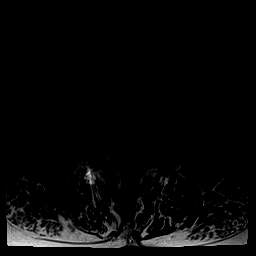
[im 23/41]
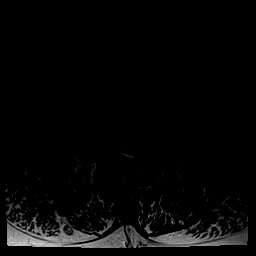
[im 29/41]
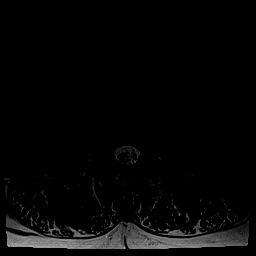
[im 35/41]
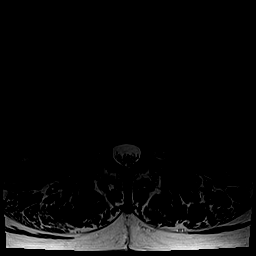
[im 41/41]
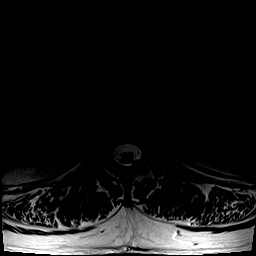

[Series 7: T1 · axial · 4.0mm · 0.39mm/px · z∈[-57,+107]mm · 3 of 41 slices shown (2 of 2)]
[im 6/41]
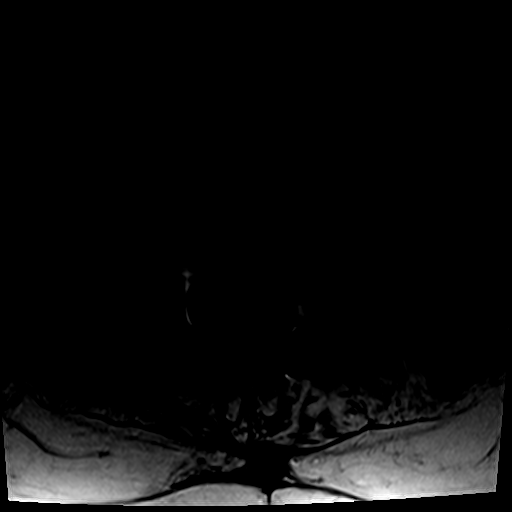
[im 21/41]
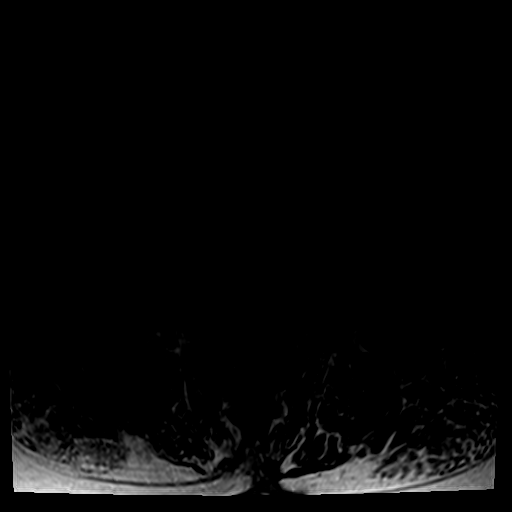
[im 35/41]
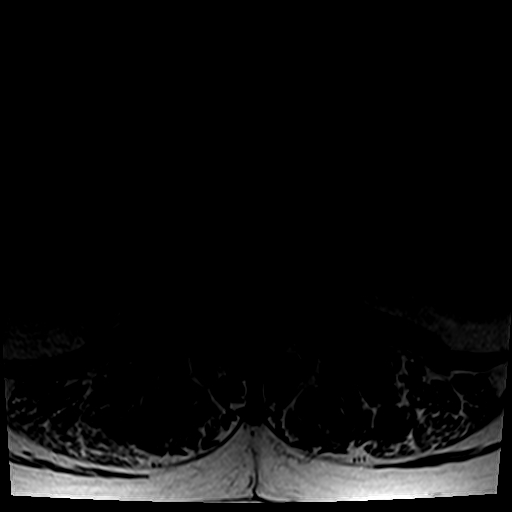

[24 of 48 positions shown; findings below may reference images not displayed]

FINDINGS: Segmentation: Normal. The lowest disc space is considered to be
L5-S1.

Alignment: There is straightening of the normal lumbar lordosis with
grade 1 retrolisthesis at L3-L4.

Vertebrae: There is posterior spinal fusion from L4-S1 with
bilateral transpedicular screws and spinal rods. There is low T1/
high T2 weighted signal within the L3 and L4 vertebral bodies,
markedly worsened from the prior study.

Conus medullaris: Extends to the L1 level and appears normal.

Paraspinal and other soft tissues: The visualized aorta, IVC and
iliac vessels are normal. The visualized retroperitoneal organs and
paraspinal soft tissues are normal.

Disc levels:

T12-L1: No significant disc herniation, spinal canal stenosis or
neuroforaminal narrowing.

L1-L2: Normal disc space and facet joints. No spinal canal stenosis.
No neuroforaminal stenosis.

L2-L3: Left eccentric disc bulge, in close proximity to the exiting
left L2 nerve root. No spinal canal stenosis. Mild bilateral
neuroforaminal stenosis.

L3-L4: There is prominent ventral epidural fat and/or scar tissue
that causes severe narrowing of the thecal sac. There is advanced
disc space loss with endplate osteophytosis and diffuse disc bulge.
Severe spinal canal stenosis. Severe right and moderate left
neuroforaminal stenosis.

L4-L5: Postfusion changes. Ventral epidural fat at the L4 level
moderately narrows the thecal sac. No spinal canal stenosis. Mild
left neuroforaminal stenosis.

L5-S1: There is narrowing of the intervertebral disc space with
postfusion changes. There is endplate osteophytosis. No spinal canal
stenosis. Unchanged moderate left neuroforaminal stenosis.
IMPRESSION: 1. Severe adjacent segment disease at L3-L4 with associated disc
osteophyte complex and ventral epidural hyper trophic fat versus
granulation tissue resulting in severe spinal canal stenosis and
severe right, moderate left foraminal stenosis.
2. Moderate left L5-S1 neural foraminal stenosis.
3. Moderate narrowing of the thecal sac at the L4-5 level, primarily
due to hypertrophied ventral epidural fat versus granulation tissue.

## 2018-03-25 DIAGNOSIS — G4733 Obstructive sleep apnea (adult) (pediatric): Secondary | ICD-10-CM | POA: Diagnosis not present

## 2018-03-29 DIAGNOSIS — F329 Major depressive disorder, single episode, unspecified: Secondary | ICD-10-CM | POA: Diagnosis not present

## 2018-04-26 DIAGNOSIS — F310 Bipolar disorder, current episode hypomanic: Secondary | ICD-10-CM | POA: Diagnosis not present

## 2018-04-26 DIAGNOSIS — F4310 Post-traumatic stress disorder, unspecified: Secondary | ICD-10-CM | POA: Diagnosis not present

## 2018-04-27 DIAGNOSIS — F4310 Post-traumatic stress disorder, unspecified: Secondary | ICD-10-CM | POA: Diagnosis not present

## 2018-04-27 DIAGNOSIS — F310 Bipolar disorder, current episode hypomanic: Secondary | ICD-10-CM | POA: Diagnosis not present

## 2018-05-10 DIAGNOSIS — F332 Major depressive disorder, recurrent severe without psychotic features: Secondary | ICD-10-CM | POA: Diagnosis not present

## 2018-05-10 DIAGNOSIS — F902 Attention-deficit hyperactivity disorder, combined type: Secondary | ICD-10-CM | POA: Diagnosis not present

## 2018-05-10 DIAGNOSIS — F3181 Bipolar II disorder: Secondary | ICD-10-CM | POA: Diagnosis not present

## 2018-05-10 DIAGNOSIS — F4310 Post-traumatic stress disorder, unspecified: Secondary | ICD-10-CM | POA: Diagnosis not present

## 2018-05-10 DIAGNOSIS — F411 Generalized anxiety disorder: Secondary | ICD-10-CM | POA: Diagnosis not present

## 2018-05-10 DIAGNOSIS — F310 Bipolar disorder, current episode hypomanic: Secondary | ICD-10-CM | POA: Diagnosis not present

## 2018-05-24 DIAGNOSIS — F4310 Post-traumatic stress disorder, unspecified: Secondary | ICD-10-CM | POA: Diagnosis not present

## 2018-05-24 DIAGNOSIS — F310 Bipolar disorder, current episode hypomanic: Secondary | ICD-10-CM | POA: Diagnosis not present

## 2018-05-25 DIAGNOSIS — F3181 Bipolar II disorder: Secondary | ICD-10-CM | POA: Diagnosis not present

## 2018-05-25 DIAGNOSIS — F332 Major depressive disorder, recurrent severe without psychotic features: Secondary | ICD-10-CM | POA: Diagnosis not present

## 2018-05-25 DIAGNOSIS — F411 Generalized anxiety disorder: Secondary | ICD-10-CM | POA: Diagnosis not present

## 2018-05-25 DIAGNOSIS — F902 Attention-deficit hyperactivity disorder, combined type: Secondary | ICD-10-CM | POA: Diagnosis not present

## 2018-06-01 DIAGNOSIS — F310 Bipolar disorder, current episode hypomanic: Secondary | ICD-10-CM | POA: Diagnosis not present

## 2018-06-01 DIAGNOSIS — F4310 Post-traumatic stress disorder, unspecified: Secondary | ICD-10-CM | POA: Diagnosis not present

## 2018-06-10 DIAGNOSIS — F4310 Post-traumatic stress disorder, unspecified: Secondary | ICD-10-CM | POA: Diagnosis not present

## 2018-06-10 DIAGNOSIS — F310 Bipolar disorder, current episode hypomanic: Secondary | ICD-10-CM | POA: Diagnosis not present

## 2018-06-21 DIAGNOSIS — E119 Type 2 diabetes mellitus without complications: Secondary | ICD-10-CM | POA: Diagnosis not present

## 2018-06-21 DIAGNOSIS — F319 Bipolar disorder, unspecified: Secondary | ICD-10-CM | POA: Diagnosis not present

## 2018-06-21 DIAGNOSIS — I10 Essential (primary) hypertension: Secondary | ICD-10-CM | POA: Diagnosis not present

## 2018-06-21 DIAGNOSIS — Z23 Encounter for immunization: Secondary | ICD-10-CM | POA: Diagnosis not present

## 2018-06-22 DIAGNOSIS — E039 Hypothyroidism, unspecified: Secondary | ICD-10-CM | POA: Diagnosis not present

## 2018-06-22 DIAGNOSIS — E782 Mixed hyperlipidemia: Secondary | ICD-10-CM | POA: Diagnosis not present

## 2018-06-22 DIAGNOSIS — I10 Essential (primary) hypertension: Secondary | ICD-10-CM | POA: Diagnosis not present

## 2018-06-23 DIAGNOSIS — F332 Major depressive disorder, recurrent severe without psychotic features: Secondary | ICD-10-CM | POA: Diagnosis not present

## 2018-06-23 DIAGNOSIS — F411 Generalized anxiety disorder: Secondary | ICD-10-CM | POA: Diagnosis not present

## 2018-06-23 DIAGNOSIS — F3181 Bipolar II disorder: Secondary | ICD-10-CM | POA: Diagnosis not present

## 2018-06-23 DIAGNOSIS — F901 Attention-deficit hyperactivity disorder, predominantly hyperactive type: Secondary | ICD-10-CM | POA: Diagnosis not present

## 2018-06-25 DIAGNOSIS — G4733 Obstructive sleep apnea (adult) (pediatric): Secondary | ICD-10-CM | POA: Diagnosis not present

## 2018-08-19 DIAGNOSIS — F902 Attention-deficit hyperactivity disorder, combined type: Secondary | ICD-10-CM | POA: Diagnosis not present

## 2018-08-19 DIAGNOSIS — F332 Major depressive disorder, recurrent severe without psychotic features: Secondary | ICD-10-CM | POA: Diagnosis not present

## 2018-08-19 DIAGNOSIS — F3181 Bipolar II disorder: Secondary | ICD-10-CM | POA: Diagnosis not present

## 2018-08-19 DIAGNOSIS — F411 Generalized anxiety disorder: Secondary | ICD-10-CM | POA: Diagnosis not present

## 2018-09-06 DIAGNOSIS — K5903 Drug induced constipation: Secondary | ICD-10-CM | POA: Diagnosis not present

## 2018-09-06 DIAGNOSIS — M47812 Spondylosis without myelopathy or radiculopathy, cervical region: Secondary | ICD-10-CM | POA: Diagnosis not present

## 2018-09-06 DIAGNOSIS — E1142 Type 2 diabetes mellitus with diabetic polyneuropathy: Secondary | ICD-10-CM | POA: Diagnosis not present

## 2018-09-06 DIAGNOSIS — G894 Chronic pain syndrome: Secondary | ICD-10-CM | POA: Diagnosis not present

## 2018-09-15 DIAGNOSIS — F3181 Bipolar II disorder: Secondary | ICD-10-CM | POA: Diagnosis not present

## 2018-09-15 DIAGNOSIS — F902 Attention-deficit hyperactivity disorder, combined type: Secondary | ICD-10-CM | POA: Diagnosis not present

## 2018-09-15 DIAGNOSIS — F332 Major depressive disorder, recurrent severe without psychotic features: Secondary | ICD-10-CM | POA: Diagnosis not present

## 2018-09-15 DIAGNOSIS — F411 Generalized anxiety disorder: Secondary | ICD-10-CM | POA: Diagnosis not present

## 2018-09-21 DIAGNOSIS — E119 Type 2 diabetes mellitus without complications: Secondary | ICD-10-CM | POA: Diagnosis not present

## 2018-09-21 DIAGNOSIS — F319 Bipolar disorder, unspecified: Secondary | ICD-10-CM | POA: Diagnosis not present

## 2018-09-21 DIAGNOSIS — Z1331 Encounter for screening for depression: Secondary | ICD-10-CM | POA: Diagnosis not present

## 2018-09-21 DIAGNOSIS — E039 Hypothyroidism, unspecified: Secondary | ICD-10-CM | POA: Diagnosis not present

## 2018-09-21 DIAGNOSIS — E663 Overweight: Secondary | ICD-10-CM | POA: Diagnosis not present

## 2018-09-21 DIAGNOSIS — I10 Essential (primary) hypertension: Secondary | ICD-10-CM | POA: Diagnosis not present

## 2018-10-01 DIAGNOSIS — G4733 Obstructive sleep apnea (adult) (pediatric): Secondary | ICD-10-CM | POA: Diagnosis not present

## 2018-10-04 DIAGNOSIS — G894 Chronic pain syndrome: Secondary | ICD-10-CM | POA: Diagnosis not present

## 2018-10-04 DIAGNOSIS — M47812 Spondylosis without myelopathy or radiculopathy, cervical region: Secondary | ICD-10-CM | POA: Diagnosis not present

## 2018-10-04 DIAGNOSIS — K5903 Drug induced constipation: Secondary | ICD-10-CM | POA: Diagnosis not present

## 2018-10-04 DIAGNOSIS — E1142 Type 2 diabetes mellitus with diabetic polyneuropathy: Secondary | ICD-10-CM | POA: Diagnosis not present

## 2018-11-02 DIAGNOSIS — E1142 Type 2 diabetes mellitus with diabetic polyneuropathy: Secondary | ICD-10-CM | POA: Diagnosis not present

## 2018-11-02 DIAGNOSIS — M47812 Spondylosis without myelopathy or radiculopathy, cervical region: Secondary | ICD-10-CM | POA: Diagnosis not present

## 2018-11-02 DIAGNOSIS — G894 Chronic pain syndrome: Secondary | ICD-10-CM | POA: Diagnosis not present

## 2018-11-02 DIAGNOSIS — K5903 Drug induced constipation: Secondary | ICD-10-CM | POA: Diagnosis not present

## 2018-11-10 DIAGNOSIS — F902 Attention-deficit hyperactivity disorder, combined type: Secondary | ICD-10-CM | POA: Diagnosis not present

## 2018-11-10 DIAGNOSIS — F3181 Bipolar II disorder: Secondary | ICD-10-CM | POA: Diagnosis not present

## 2018-11-10 DIAGNOSIS — F411 Generalized anxiety disorder: Secondary | ICD-10-CM | POA: Diagnosis not present

## 2018-11-10 DIAGNOSIS — F332 Major depressive disorder, recurrent severe without psychotic features: Secondary | ICD-10-CM | POA: Diagnosis not present

## 2018-11-26 DIAGNOSIS — F3181 Bipolar II disorder: Secondary | ICD-10-CM | POA: Diagnosis not present

## 2018-11-26 DIAGNOSIS — F902 Attention-deficit hyperactivity disorder, combined type: Secondary | ICD-10-CM | POA: Diagnosis not present

## 2018-11-26 DIAGNOSIS — F411 Generalized anxiety disorder: Secondary | ICD-10-CM | POA: Diagnosis not present

## 2018-11-26 DIAGNOSIS — F332 Major depressive disorder, recurrent severe without psychotic features: Secondary | ICD-10-CM | POA: Diagnosis not present

## 2018-12-03 DIAGNOSIS — F332 Major depressive disorder, recurrent severe without psychotic features: Secondary | ICD-10-CM | POA: Diagnosis not present

## 2018-12-03 DIAGNOSIS — F902 Attention-deficit hyperactivity disorder, combined type: Secondary | ICD-10-CM | POA: Diagnosis not present

## 2018-12-03 DIAGNOSIS — F3181 Bipolar II disorder: Secondary | ICD-10-CM | POA: Diagnosis not present

## 2018-12-03 DIAGNOSIS — F411 Generalized anxiety disorder: Secondary | ICD-10-CM | POA: Diagnosis not present

## 2018-12-06 DIAGNOSIS — F332 Major depressive disorder, recurrent severe without psychotic features: Secondary | ICD-10-CM | POA: Diagnosis not present

## 2018-12-06 DIAGNOSIS — F3181 Bipolar II disorder: Secondary | ICD-10-CM | POA: Diagnosis not present

## 2018-12-06 DIAGNOSIS — F411 Generalized anxiety disorder: Secondary | ICD-10-CM | POA: Diagnosis not present

## 2018-12-06 DIAGNOSIS — F902 Attention-deficit hyperactivity disorder, combined type: Secondary | ICD-10-CM | POA: Diagnosis not present

## 2018-12-07 DIAGNOSIS — F332 Major depressive disorder, recurrent severe without psychotic features: Secondary | ICD-10-CM | POA: Diagnosis not present

## 2018-12-07 DIAGNOSIS — F3181 Bipolar II disorder: Secondary | ICD-10-CM | POA: Diagnosis not present

## 2018-12-07 DIAGNOSIS — F411 Generalized anxiety disorder: Secondary | ICD-10-CM | POA: Diagnosis not present

## 2018-12-07 DIAGNOSIS — F902 Attention-deficit hyperactivity disorder, combined type: Secondary | ICD-10-CM | POA: Diagnosis not present

## 2018-12-09 DIAGNOSIS — E1142 Type 2 diabetes mellitus with diabetic polyneuropathy: Secondary | ICD-10-CM | POA: Diagnosis not present

## 2018-12-09 DIAGNOSIS — K5903 Drug induced constipation: Secondary | ICD-10-CM | POA: Diagnosis not present

## 2018-12-09 DIAGNOSIS — G894 Chronic pain syndrome: Secondary | ICD-10-CM | POA: Diagnosis not present

## 2018-12-09 DIAGNOSIS — M47812 Spondylosis without myelopathy or radiculopathy, cervical region: Secondary | ICD-10-CM | POA: Diagnosis not present

## 2018-12-24 DIAGNOSIS — E782 Mixed hyperlipidemia: Secondary | ICD-10-CM | POA: Diagnosis not present

## 2018-12-24 DIAGNOSIS — E119 Type 2 diabetes mellitus without complications: Secondary | ICD-10-CM | POA: Diagnosis not present

## 2018-12-24 DIAGNOSIS — E039 Hypothyroidism, unspecified: Secondary | ICD-10-CM | POA: Diagnosis not present

## 2018-12-29 DIAGNOSIS — I10 Essential (primary) hypertension: Secondary | ICD-10-CM | POA: Diagnosis not present

## 2018-12-29 DIAGNOSIS — F319 Bipolar disorder, unspecified: Secondary | ICD-10-CM | POA: Diagnosis not present

## 2018-12-29 DIAGNOSIS — E119 Type 2 diabetes mellitus without complications: Secondary | ICD-10-CM | POA: Diagnosis not present

## 2018-12-29 DIAGNOSIS — E782 Mixed hyperlipidemia: Secondary | ICD-10-CM | POA: Diagnosis not present

## 2018-12-30 DIAGNOSIS — G4733 Obstructive sleep apnea (adult) (pediatric): Secondary | ICD-10-CM | POA: Diagnosis not present

## 2019-01-05 DIAGNOSIS — F3181 Bipolar II disorder: Secondary | ICD-10-CM | POA: Diagnosis not present

## 2019-01-05 DIAGNOSIS — K5903 Drug induced constipation: Secondary | ICD-10-CM | POA: Diagnosis not present

## 2019-01-05 DIAGNOSIS — F332 Major depressive disorder, recurrent severe without psychotic features: Secondary | ICD-10-CM | POA: Diagnosis not present

## 2019-01-05 DIAGNOSIS — E1142 Type 2 diabetes mellitus with diabetic polyneuropathy: Secondary | ICD-10-CM | POA: Diagnosis not present

## 2019-01-05 DIAGNOSIS — G894 Chronic pain syndrome: Secondary | ICD-10-CM | POA: Diagnosis not present

## 2019-01-05 DIAGNOSIS — M47812 Spondylosis without myelopathy or radiculopathy, cervical region: Secondary | ICD-10-CM | POA: Diagnosis not present

## 2019-01-05 DIAGNOSIS — F411 Generalized anxiety disorder: Secondary | ICD-10-CM | POA: Diagnosis not present

## 2019-01-05 DIAGNOSIS — F902 Attention-deficit hyperactivity disorder, combined type: Secondary | ICD-10-CM | POA: Diagnosis not present

## 2019-01-31 DIAGNOSIS — G4733 Obstructive sleep apnea (adult) (pediatric): Secondary | ICD-10-CM | POA: Diagnosis not present

## 2019-02-02 DIAGNOSIS — G894 Chronic pain syndrome: Secondary | ICD-10-CM | POA: Diagnosis not present

## 2019-02-02 DIAGNOSIS — K5903 Drug induced constipation: Secondary | ICD-10-CM | POA: Diagnosis not present

## 2019-02-02 DIAGNOSIS — E1142 Type 2 diabetes mellitus with diabetic polyneuropathy: Secondary | ICD-10-CM | POA: Diagnosis not present

## 2019-02-02 DIAGNOSIS — F3181 Bipolar II disorder: Secondary | ICD-10-CM | POA: Diagnosis not present

## 2019-02-02 DIAGNOSIS — F411 Generalized anxiety disorder: Secondary | ICD-10-CM | POA: Diagnosis not present

## 2019-02-02 DIAGNOSIS — M47812 Spondylosis without myelopathy or radiculopathy, cervical region: Secondary | ICD-10-CM | POA: Diagnosis not present

## 2019-02-02 DIAGNOSIS — F332 Major depressive disorder, recurrent severe without psychotic features: Secondary | ICD-10-CM | POA: Diagnosis not present

## 2019-02-02 DIAGNOSIS — F902 Attention-deficit hyperactivity disorder, combined type: Secondary | ICD-10-CM | POA: Diagnosis not present

## 2019-03-03 DIAGNOSIS — G4733 Obstructive sleep apnea (adult) (pediatric): Secondary | ICD-10-CM | POA: Diagnosis not present

## 2019-03-03 DIAGNOSIS — G894 Chronic pain syndrome: Secondary | ICD-10-CM | POA: Diagnosis not present

## 2019-03-03 DIAGNOSIS — M47812 Spondylosis without myelopathy or radiculopathy, cervical region: Secondary | ICD-10-CM | POA: Diagnosis not present

## 2019-03-03 DIAGNOSIS — F332 Major depressive disorder, recurrent severe without psychotic features: Secondary | ICD-10-CM | POA: Diagnosis not present

## 2019-03-03 DIAGNOSIS — F3181 Bipolar II disorder: Secondary | ICD-10-CM | POA: Diagnosis not present

## 2019-03-03 DIAGNOSIS — F902 Attention-deficit hyperactivity disorder, combined type: Secondary | ICD-10-CM | POA: Diagnosis not present

## 2019-03-03 DIAGNOSIS — F411 Generalized anxiety disorder: Secondary | ICD-10-CM | POA: Diagnosis not present

## 2019-03-03 DIAGNOSIS — E1142 Type 2 diabetes mellitus with diabetic polyneuropathy: Secondary | ICD-10-CM | POA: Diagnosis not present

## 2019-03-03 DIAGNOSIS — K5903 Drug induced constipation: Secondary | ICD-10-CM | POA: Diagnosis not present

## 2019-03-30 DIAGNOSIS — F902 Attention-deficit hyperactivity disorder, combined type: Secondary | ICD-10-CM | POA: Diagnosis not present

## 2019-03-30 DIAGNOSIS — F3181 Bipolar II disorder: Secondary | ICD-10-CM | POA: Diagnosis not present

## 2019-03-30 DIAGNOSIS — G4733 Obstructive sleep apnea (adult) (pediatric): Secondary | ICD-10-CM | POA: Diagnosis not present

## 2019-03-30 DIAGNOSIS — F332 Major depressive disorder, recurrent severe without psychotic features: Secondary | ICD-10-CM | POA: Diagnosis not present

## 2019-03-30 DIAGNOSIS — F411 Generalized anxiety disorder: Secondary | ICD-10-CM | POA: Diagnosis not present

## 2019-03-31 DIAGNOSIS — K5903 Drug induced constipation: Secondary | ICD-10-CM | POA: Diagnosis not present

## 2019-03-31 DIAGNOSIS — G894 Chronic pain syndrome: Secondary | ICD-10-CM | POA: Diagnosis not present

## 2019-03-31 DIAGNOSIS — M47812 Spondylosis without myelopathy or radiculopathy, cervical region: Secondary | ICD-10-CM | POA: Diagnosis not present

## 2019-03-31 DIAGNOSIS — E1142 Type 2 diabetes mellitus with diabetic polyneuropathy: Secondary | ICD-10-CM | POA: Diagnosis not present

## 2019-04-01 DIAGNOSIS — I10 Essential (primary) hypertension: Secondary | ICD-10-CM | POA: Diagnosis not present

## 2019-04-01 DIAGNOSIS — E782 Mixed hyperlipidemia: Secondary | ICD-10-CM | POA: Diagnosis not present

## 2019-04-01 DIAGNOSIS — E119 Type 2 diabetes mellitus without complications: Secondary | ICD-10-CM | POA: Diagnosis not present

## 2019-04-01 DIAGNOSIS — E039 Hypothyroidism, unspecified: Secondary | ICD-10-CM | POA: Diagnosis not present

## 2019-04-01 DIAGNOSIS — F319 Bipolar disorder, unspecified: Secondary | ICD-10-CM | POA: Diagnosis not present

## 2019-04-21 DIAGNOSIS — F902 Attention-deficit hyperactivity disorder, combined type: Secondary | ICD-10-CM | POA: Diagnosis not present

## 2019-04-21 DIAGNOSIS — F3181 Bipolar II disorder: Secondary | ICD-10-CM | POA: Diagnosis not present

## 2019-04-21 DIAGNOSIS — F332 Major depressive disorder, recurrent severe without psychotic features: Secondary | ICD-10-CM | POA: Diagnosis not present

## 2019-04-21 DIAGNOSIS — F411 Generalized anxiety disorder: Secondary | ICD-10-CM | POA: Diagnosis not present

## 2019-05-10 DIAGNOSIS — F3181 Bipolar II disorder: Secondary | ICD-10-CM | POA: Diagnosis not present

## 2019-05-10 DIAGNOSIS — F902 Attention-deficit hyperactivity disorder, combined type: Secondary | ICD-10-CM | POA: Diagnosis not present

## 2019-05-10 DIAGNOSIS — F411 Generalized anxiety disorder: Secondary | ICD-10-CM | POA: Diagnosis not present

## 2019-05-10 DIAGNOSIS — F332 Major depressive disorder, recurrent severe without psychotic features: Secondary | ICD-10-CM | POA: Diagnosis not present

## 2019-05-19 DIAGNOSIS — F332 Major depressive disorder, recurrent severe without psychotic features: Secondary | ICD-10-CM | POA: Diagnosis not present

## 2019-05-19 DIAGNOSIS — F3181 Bipolar II disorder: Secondary | ICD-10-CM | POA: Diagnosis not present

## 2019-05-19 DIAGNOSIS — F411 Generalized anxiety disorder: Secondary | ICD-10-CM | POA: Diagnosis not present

## 2019-05-19 DIAGNOSIS — F902 Attention-deficit hyperactivity disorder, combined type: Secondary | ICD-10-CM | POA: Diagnosis not present

## 2019-06-20 DIAGNOSIS — F411 Generalized anxiety disorder: Secondary | ICD-10-CM | POA: Diagnosis not present

## 2019-06-20 DIAGNOSIS — F332 Major depressive disorder, recurrent severe without psychotic features: Secondary | ICD-10-CM | POA: Diagnosis not present

## 2019-06-20 DIAGNOSIS — F902 Attention-deficit hyperactivity disorder, combined type: Secondary | ICD-10-CM | POA: Diagnosis not present

## 2019-06-20 DIAGNOSIS — F3181 Bipolar II disorder: Secondary | ICD-10-CM | POA: Diagnosis not present

## 2019-06-21 DIAGNOSIS — G894 Chronic pain syndrome: Secondary | ICD-10-CM | POA: Diagnosis not present

## 2019-06-21 DIAGNOSIS — M961 Postlaminectomy syndrome, not elsewhere classified: Secondary | ICD-10-CM | POA: Diagnosis not present

## 2019-06-21 DIAGNOSIS — Z79899 Other long term (current) drug therapy: Secondary | ICD-10-CM | POA: Diagnosis not present

## 2019-06-21 DIAGNOSIS — M792 Neuralgia and neuritis, unspecified: Secondary | ICD-10-CM | POA: Diagnosis not present

## 2019-06-28 DIAGNOSIS — G894 Chronic pain syndrome: Secondary | ICD-10-CM | POA: Diagnosis not present

## 2019-07-18 DIAGNOSIS — F332 Major depressive disorder, recurrent severe without psychotic features: Secondary | ICD-10-CM | POA: Diagnosis not present

## 2019-07-18 DIAGNOSIS — F3181 Bipolar II disorder: Secondary | ICD-10-CM | POA: Diagnosis not present

## 2019-07-18 DIAGNOSIS — F902 Attention-deficit hyperactivity disorder, combined type: Secondary | ICD-10-CM | POA: Diagnosis not present

## 2019-07-18 DIAGNOSIS — F411 Generalized anxiety disorder: Secondary | ICD-10-CM | POA: Diagnosis not present

## 2019-07-19 DIAGNOSIS — Z20828 Contact with and (suspected) exposure to other viral communicable diseases: Secondary | ICD-10-CM | POA: Diagnosis not present

## 2019-07-19 DIAGNOSIS — J209 Acute bronchitis, unspecified: Secondary | ICD-10-CM | POA: Diagnosis not present

## 2019-08-11 DIAGNOSIS — I10 Essential (primary) hypertension: Secondary | ICD-10-CM | POA: Diagnosis not present

## 2019-08-11 DIAGNOSIS — F319 Bipolar disorder, unspecified: Secondary | ICD-10-CM | POA: Diagnosis not present

## 2019-08-11 DIAGNOSIS — E039 Hypothyroidism, unspecified: Secondary | ICD-10-CM | POA: Diagnosis not present

## 2019-08-11 DIAGNOSIS — Z23 Encounter for immunization: Secondary | ICD-10-CM | POA: Diagnosis not present

## 2019-08-11 DIAGNOSIS — E119 Type 2 diabetes mellitus without complications: Secondary | ICD-10-CM | POA: Diagnosis not present

## 2019-08-15 DIAGNOSIS — F332 Major depressive disorder, recurrent severe without psychotic features: Secondary | ICD-10-CM | POA: Diagnosis not present

## 2019-08-15 DIAGNOSIS — F902 Attention-deficit hyperactivity disorder, combined type: Secondary | ICD-10-CM | POA: Diagnosis not present

## 2019-08-15 DIAGNOSIS — F3181 Bipolar II disorder: Secondary | ICD-10-CM | POA: Diagnosis not present

## 2019-08-15 DIAGNOSIS — F411 Generalized anxiety disorder: Secondary | ICD-10-CM | POA: Diagnosis not present

## 2019-09-12 DIAGNOSIS — Z20822 Contact with and (suspected) exposure to covid-19: Secondary | ICD-10-CM | POA: Diagnosis not present

## 2019-09-12 DIAGNOSIS — J410 Simple chronic bronchitis: Secondary | ICD-10-CM | POA: Diagnosis not present

## 2019-10-05 DIAGNOSIS — G894 Chronic pain syndrome: Secondary | ICD-10-CM | POA: Diagnosis not present

## 2019-10-05 DIAGNOSIS — M792 Neuralgia and neuritis, unspecified: Secondary | ICD-10-CM | POA: Diagnosis not present

## 2019-10-05 DIAGNOSIS — F411 Generalized anxiety disorder: Secondary | ICD-10-CM | POA: Diagnosis not present

## 2019-10-05 DIAGNOSIS — F902 Attention-deficit hyperactivity disorder, combined type: Secondary | ICD-10-CM | POA: Diagnosis not present

## 2019-10-05 DIAGNOSIS — M7918 Myalgia, other site: Secondary | ICD-10-CM | POA: Diagnosis not present

## 2019-10-05 DIAGNOSIS — M961 Postlaminectomy syndrome, not elsewhere classified: Secondary | ICD-10-CM | POA: Diagnosis not present

## 2019-10-05 DIAGNOSIS — F3181 Bipolar II disorder: Secondary | ICD-10-CM | POA: Diagnosis not present

## 2019-10-05 DIAGNOSIS — F332 Major depressive disorder, recurrent severe without psychotic features: Secondary | ICD-10-CM | POA: Diagnosis not present

## 2019-10-07 DIAGNOSIS — M5416 Radiculopathy, lumbar region: Secondary | ICD-10-CM | POA: Insufficient documentation

## 2019-10-14 DIAGNOSIS — M5416 Radiculopathy, lumbar region: Secondary | ICD-10-CM | POA: Diagnosis not present

## 2019-10-26 DIAGNOSIS — F3181 Bipolar II disorder: Secondary | ICD-10-CM | POA: Diagnosis not present

## 2019-10-26 DIAGNOSIS — F902 Attention-deficit hyperactivity disorder, combined type: Secondary | ICD-10-CM | POA: Diagnosis not present

## 2019-10-26 DIAGNOSIS — F411 Generalized anxiety disorder: Secondary | ICD-10-CM | POA: Diagnosis not present

## 2019-10-26 DIAGNOSIS — F332 Major depressive disorder, recurrent severe without psychotic features: Secondary | ICD-10-CM | POA: Diagnosis not present

## 2019-11-02 DIAGNOSIS — F3181 Bipolar II disorder: Secondary | ICD-10-CM | POA: Diagnosis not present

## 2019-11-02 DIAGNOSIS — M5416 Radiculopathy, lumbar region: Secondary | ICD-10-CM | POA: Diagnosis not present

## 2019-11-02 DIAGNOSIS — M961 Postlaminectomy syndrome, not elsewhere classified: Secondary | ICD-10-CM | POA: Diagnosis not present

## 2019-11-02 DIAGNOSIS — M7918 Myalgia, other site: Secondary | ICD-10-CM | POA: Diagnosis not present

## 2019-11-02 DIAGNOSIS — M792 Neuralgia and neuritis, unspecified: Secondary | ICD-10-CM | POA: Diagnosis not present

## 2019-11-14 DIAGNOSIS — G8929 Other chronic pain: Secondary | ICD-10-CM | POA: Diagnosis not present

## 2019-11-14 DIAGNOSIS — M961 Postlaminectomy syndrome, not elsewhere classified: Secondary | ICD-10-CM | POA: Diagnosis not present

## 2019-11-14 DIAGNOSIS — M546 Pain in thoracic spine: Secondary | ICD-10-CM | POA: Diagnosis not present

## 2019-11-21 DIAGNOSIS — G8929 Other chronic pain: Secondary | ICD-10-CM | POA: Diagnosis not present

## 2019-11-21 DIAGNOSIS — M546 Pain in thoracic spine: Secondary | ICD-10-CM | POA: Diagnosis not present

## 2019-11-21 DIAGNOSIS — M961 Postlaminectomy syndrome, not elsewhere classified: Secondary | ICD-10-CM | POA: Diagnosis not present

## 2019-11-22 DIAGNOSIS — F3181 Bipolar II disorder: Secondary | ICD-10-CM | POA: Diagnosis not present

## 2019-11-22 DIAGNOSIS — F411 Generalized anxiety disorder: Secondary | ICD-10-CM | POA: Diagnosis not present

## 2019-11-22 DIAGNOSIS — F902 Attention-deficit hyperactivity disorder, combined type: Secondary | ICD-10-CM | POA: Diagnosis not present

## 2019-11-22 DIAGNOSIS — F332 Major depressive disorder, recurrent severe without psychotic features: Secondary | ICD-10-CM | POA: Diagnosis not present

## 2019-11-28 DIAGNOSIS — G8929 Other chronic pain: Secondary | ICD-10-CM | POA: Diagnosis not present

## 2019-11-28 DIAGNOSIS — M546 Pain in thoracic spine: Secondary | ICD-10-CM | POA: Diagnosis not present

## 2019-11-28 DIAGNOSIS — M961 Postlaminectomy syndrome, not elsewhere classified: Secondary | ICD-10-CM | POA: Diagnosis not present

## 2019-11-30 DIAGNOSIS — Z79899 Other long term (current) drug therapy: Secondary | ICD-10-CM | POA: Diagnosis not present

## 2019-11-30 DIAGNOSIS — M7918 Myalgia, other site: Secondary | ICD-10-CM | POA: Diagnosis not present

## 2019-11-30 DIAGNOSIS — M5416 Radiculopathy, lumbar region: Secondary | ICD-10-CM | POA: Diagnosis not present

## 2019-11-30 DIAGNOSIS — M961 Postlaminectomy syndrome, not elsewhere classified: Secondary | ICD-10-CM | POA: Diagnosis not present

## 2019-12-08 DIAGNOSIS — G8929 Other chronic pain: Secondary | ICD-10-CM | POA: Diagnosis not present

## 2019-12-08 DIAGNOSIS — M546 Pain in thoracic spine: Secondary | ICD-10-CM | POA: Diagnosis not present

## 2019-12-08 DIAGNOSIS — M961 Postlaminectomy syndrome, not elsewhere classified: Secondary | ICD-10-CM | POA: Diagnosis not present

## 2019-12-09 DIAGNOSIS — E1169 Type 2 diabetes mellitus with other specified complication: Secondary | ICD-10-CM | POA: Diagnosis not present

## 2019-12-09 DIAGNOSIS — E039 Hypothyroidism, unspecified: Secondary | ICD-10-CM | POA: Diagnosis not present

## 2019-12-09 DIAGNOSIS — E785 Hyperlipidemia, unspecified: Secondary | ICD-10-CM | POA: Diagnosis not present

## 2019-12-09 DIAGNOSIS — I10 Essential (primary) hypertension: Secondary | ICD-10-CM | POA: Diagnosis not present

## 2019-12-27 DIAGNOSIS — F902 Attention-deficit hyperactivity disorder, combined type: Secondary | ICD-10-CM | POA: Diagnosis not present

## 2019-12-27 DIAGNOSIS — F332 Major depressive disorder, recurrent severe without psychotic features: Secondary | ICD-10-CM | POA: Diagnosis not present

## 2019-12-27 DIAGNOSIS — F3181 Bipolar II disorder: Secondary | ICD-10-CM | POA: Diagnosis not present

## 2019-12-27 DIAGNOSIS — F411 Generalized anxiety disorder: Secondary | ICD-10-CM | POA: Diagnosis not present

## 2020-01-11 DIAGNOSIS — M7918 Myalgia, other site: Secondary | ICD-10-CM | POA: Diagnosis not present

## 2020-01-11 DIAGNOSIS — M961 Postlaminectomy syndrome, not elsewhere classified: Secondary | ICD-10-CM | POA: Diagnosis not present

## 2020-01-11 DIAGNOSIS — M5416 Radiculopathy, lumbar region: Secondary | ICD-10-CM | POA: Diagnosis not present

## 2020-01-11 DIAGNOSIS — Z6841 Body Mass Index (BMI) 40.0 and over, adult: Secondary | ICD-10-CM | POA: Diagnosis not present

## 2020-01-11 DIAGNOSIS — Z79899 Other long term (current) drug therapy: Secondary | ICD-10-CM | POA: Diagnosis not present

## 2020-01-11 DIAGNOSIS — H6690 Otitis media, unspecified, unspecified ear: Secondary | ICD-10-CM | POA: Diagnosis not present

## 2020-01-20 DIAGNOSIS — M5416 Radiculopathy, lumbar region: Secondary | ICD-10-CM | POA: Diagnosis not present

## 2020-01-20 DIAGNOSIS — M546 Pain in thoracic spine: Secondary | ICD-10-CM | POA: Diagnosis not present

## 2020-01-20 DIAGNOSIS — M5134 Other intervertebral disc degeneration, thoracic region: Secondary | ICD-10-CM | POA: Diagnosis not present

## 2020-01-26 DIAGNOSIS — M961 Postlaminectomy syndrome, not elsewhere classified: Secondary | ICD-10-CM | POA: Diagnosis not present

## 2020-01-26 DIAGNOSIS — F4310 Post-traumatic stress disorder, unspecified: Secondary | ICD-10-CM | POA: Diagnosis not present

## 2020-01-26 DIAGNOSIS — M5416 Radiculopathy, lumbar region: Secondary | ICD-10-CM | POA: Diagnosis not present

## 2020-01-26 DIAGNOSIS — F419 Anxiety disorder, unspecified: Secondary | ICD-10-CM | POA: Diagnosis not present

## 2020-02-02 DIAGNOSIS — H6121 Impacted cerumen, right ear: Secondary | ICD-10-CM | POA: Diagnosis not present

## 2020-02-02 DIAGNOSIS — Z6841 Body Mass Index (BMI) 40.0 and over, adult: Secondary | ICD-10-CM | POA: Diagnosis not present

## 2020-02-02 DIAGNOSIS — H6090 Unspecified otitis externa, unspecified ear: Secondary | ICD-10-CM | POA: Diagnosis not present

## 2020-04-19 DIAGNOSIS — E039 Hypothyroidism, unspecified: Secondary | ICD-10-CM | POA: Diagnosis not present

## 2020-04-19 DIAGNOSIS — E785 Hyperlipidemia, unspecified: Secondary | ICD-10-CM | POA: Diagnosis not present

## 2020-04-19 DIAGNOSIS — I10 Essential (primary) hypertension: Secondary | ICD-10-CM | POA: Diagnosis not present

## 2020-04-19 DIAGNOSIS — E1169 Type 2 diabetes mellitus with other specified complication: Secondary | ICD-10-CM | POA: Diagnosis not present

## 2020-04-26 DIAGNOSIS — F419 Anxiety disorder, unspecified: Secondary | ICD-10-CM | POA: Diagnosis not present

## 2020-04-26 DIAGNOSIS — F319 Bipolar disorder, unspecified: Secondary | ICD-10-CM | POA: Diagnosis not present

## 2020-05-24 DIAGNOSIS — F319 Bipolar disorder, unspecified: Secondary | ICD-10-CM | POA: Diagnosis not present

## 2020-05-24 DIAGNOSIS — F419 Anxiety disorder, unspecified: Secondary | ICD-10-CM | POA: Diagnosis not present

## 2020-06-21 DIAGNOSIS — F419 Anxiety disorder, unspecified: Secondary | ICD-10-CM | POA: Diagnosis not present

## 2020-06-21 DIAGNOSIS — F319 Bipolar disorder, unspecified: Secondary | ICD-10-CM | POA: Diagnosis not present

## 2020-06-26 DIAGNOSIS — M5416 Radiculopathy, lumbar region: Secondary | ICD-10-CM | POA: Diagnosis not present

## 2020-06-26 DIAGNOSIS — Z6841 Body Mass Index (BMI) 40.0 and over, adult: Secondary | ICD-10-CM | POA: Diagnosis not present

## 2020-06-28 DIAGNOSIS — M546 Pain in thoracic spine: Secondary | ICD-10-CM | POA: Diagnosis not present

## 2020-06-28 DIAGNOSIS — M7918 Myalgia, other site: Secondary | ICD-10-CM | POA: Diagnosis not present

## 2020-06-28 DIAGNOSIS — M961 Postlaminectomy syndrome, not elsewhere classified: Secondary | ICD-10-CM | POA: Diagnosis not present

## 2020-06-28 DIAGNOSIS — M47814 Spondylosis without myelopathy or radiculopathy, thoracic region: Secondary | ICD-10-CM | POA: Diagnosis not present

## 2020-07-27 DIAGNOSIS — F319 Bipolar disorder, unspecified: Secondary | ICD-10-CM | POA: Diagnosis not present

## 2020-07-27 DIAGNOSIS — F419 Anxiety disorder, unspecified: Secondary | ICD-10-CM | POA: Diagnosis not present

## 2020-08-31 DIAGNOSIS — F419 Anxiety disorder, unspecified: Secondary | ICD-10-CM | POA: Diagnosis not present

## 2020-08-31 DIAGNOSIS — F319 Bipolar disorder, unspecified: Secondary | ICD-10-CM | POA: Diagnosis not present

## 2020-09-04 DIAGNOSIS — E1165 Type 2 diabetes mellitus with hyperglycemia: Secondary | ICD-10-CM | POA: Diagnosis not present

## 2020-09-04 DIAGNOSIS — Z6841 Body Mass Index (BMI) 40.0 and over, adult: Secondary | ICD-10-CM | POA: Diagnosis not present

## 2020-09-28 DIAGNOSIS — F419 Anxiety disorder, unspecified: Secondary | ICD-10-CM | POA: Diagnosis not present

## 2020-09-28 DIAGNOSIS — F319 Bipolar disorder, unspecified: Secondary | ICD-10-CM | POA: Diagnosis not present

## 2020-10-08 DIAGNOSIS — E1169 Type 2 diabetes mellitus with other specified complication: Secondary | ICD-10-CM | POA: Diagnosis not present

## 2020-10-08 DIAGNOSIS — I10 Essential (primary) hypertension: Secondary | ICD-10-CM | POA: Diagnosis not present

## 2020-10-08 DIAGNOSIS — E785 Hyperlipidemia, unspecified: Secondary | ICD-10-CM | POA: Diagnosis not present

## 2020-10-08 DIAGNOSIS — E039 Hypothyroidism, unspecified: Secondary | ICD-10-CM | POA: Diagnosis not present

## 2020-10-25 DIAGNOSIS — F319 Bipolar disorder, unspecified: Secondary | ICD-10-CM | POA: Diagnosis not present

## 2020-10-25 DIAGNOSIS — F419 Anxiety disorder, unspecified: Secondary | ICD-10-CM | POA: Diagnosis not present

## 2020-11-30 DIAGNOSIS — F419 Anxiety disorder, unspecified: Secondary | ICD-10-CM | POA: Diagnosis not present

## 2020-11-30 DIAGNOSIS — F319 Bipolar disorder, unspecified: Secondary | ICD-10-CM | POA: Diagnosis not present

## 2020-12-07 DIAGNOSIS — I10 Essential (primary) hypertension: Secondary | ICD-10-CM | POA: Diagnosis not present

## 2020-12-07 DIAGNOSIS — E782 Mixed hyperlipidemia: Secondary | ICD-10-CM | POA: Diagnosis not present

## 2020-12-07 DIAGNOSIS — E785 Hyperlipidemia, unspecified: Secondary | ICD-10-CM | POA: Diagnosis not present

## 2020-12-07 DIAGNOSIS — E1169 Type 2 diabetes mellitus with other specified complication: Secondary | ICD-10-CM | POA: Diagnosis not present

## 2021-01-24 DIAGNOSIS — F419 Anxiety disorder, unspecified: Secondary | ICD-10-CM | POA: Diagnosis not present

## 2021-01-24 DIAGNOSIS — F319 Bipolar disorder, unspecified: Secondary | ICD-10-CM | POA: Diagnosis not present

## 2021-03-04 DIAGNOSIS — F419 Anxiety disorder, unspecified: Secondary | ICD-10-CM | POA: Diagnosis not present

## 2021-03-04 DIAGNOSIS — F319 Bipolar disorder, unspecified: Secondary | ICD-10-CM | POA: Diagnosis not present

## 2021-03-08 DIAGNOSIS — E039 Hypothyroidism, unspecified: Secondary | ICD-10-CM | POA: Diagnosis not present

## 2021-03-08 DIAGNOSIS — I10 Essential (primary) hypertension: Secondary | ICD-10-CM | POA: Diagnosis not present

## 2021-03-08 DIAGNOSIS — E785 Hyperlipidemia, unspecified: Secondary | ICD-10-CM | POA: Diagnosis not present

## 2021-03-08 DIAGNOSIS — E782 Mixed hyperlipidemia: Secondary | ICD-10-CM | POA: Diagnosis not present

## 2021-03-08 DIAGNOSIS — Z125 Encounter for screening for malignant neoplasm of prostate: Secondary | ICD-10-CM | POA: Diagnosis not present

## 2021-03-08 DIAGNOSIS — E1169 Type 2 diabetes mellitus with other specified complication: Secondary | ICD-10-CM | POA: Diagnosis not present

## 2021-03-19 DIAGNOSIS — F419 Anxiety disorder, unspecified: Secondary | ICD-10-CM | POA: Diagnosis not present

## 2021-03-19 DIAGNOSIS — F319 Bipolar disorder, unspecified: Secondary | ICD-10-CM | POA: Diagnosis not present

## 2021-04-04 DIAGNOSIS — F319 Bipolar disorder, unspecified: Secondary | ICD-10-CM | POA: Diagnosis not present

## 2021-04-04 DIAGNOSIS — F419 Anxiety disorder, unspecified: Secondary | ICD-10-CM | POA: Diagnosis not present

## 2021-07-10 DIAGNOSIS — I10 Essential (primary) hypertension: Secondary | ICD-10-CM | POA: Diagnosis not present

## 2021-07-10 DIAGNOSIS — E1169 Type 2 diabetes mellitus with other specified complication: Secondary | ICD-10-CM | POA: Diagnosis not present

## 2021-07-10 DIAGNOSIS — Z23 Encounter for immunization: Secondary | ICD-10-CM | POA: Diagnosis not present

## 2021-07-10 DIAGNOSIS — Z79899 Other long term (current) drug therapy: Secondary | ICD-10-CM | POA: Diagnosis not present

## 2021-07-10 DIAGNOSIS — E1140 Type 2 diabetes mellitus with diabetic neuropathy, unspecified: Secondary | ICD-10-CM | POA: Diagnosis not present

## 2021-07-10 DIAGNOSIS — E782 Mixed hyperlipidemia: Secondary | ICD-10-CM | POA: Diagnosis not present

## 2021-07-10 DIAGNOSIS — E039 Hypothyroidism, unspecified: Secondary | ICD-10-CM | POA: Diagnosis not present

## 2021-07-15 DIAGNOSIS — F1020 Alcohol dependence, uncomplicated: Secondary | ICD-10-CM | POA: Diagnosis not present

## 2021-07-17 DIAGNOSIS — F1020 Alcohol dependence, uncomplicated: Secondary | ICD-10-CM | POA: Diagnosis not present

## 2021-07-18 DIAGNOSIS — F1020 Alcohol dependence, uncomplicated: Secondary | ICD-10-CM | POA: Diagnosis not present

## 2021-07-19 DIAGNOSIS — F1020 Alcohol dependence, uncomplicated: Secondary | ICD-10-CM | POA: Diagnosis not present

## 2021-07-23 DIAGNOSIS — F1020 Alcohol dependence, uncomplicated: Secondary | ICD-10-CM | POA: Diagnosis not present

## 2021-08-27 DIAGNOSIS — Z6841 Body Mass Index (BMI) 40.0 and over, adult: Secondary | ICD-10-CM | POA: Diagnosis not present

## 2021-08-27 DIAGNOSIS — J208 Acute bronchitis due to other specified organisms: Secondary | ICD-10-CM | POA: Diagnosis not present

## 2021-08-27 DIAGNOSIS — U071 COVID-19: Secondary | ICD-10-CM | POA: Diagnosis not present

## 2021-09-06 DIAGNOSIS — S91302A Unspecified open wound, left foot, initial encounter: Secondary | ICD-10-CM | POA: Diagnosis not present

## 2021-09-06 DIAGNOSIS — Z23 Encounter for immunization: Secondary | ICD-10-CM | POA: Diagnosis not present

## 2021-09-06 DIAGNOSIS — X58XXXA Exposure to other specified factors, initial encounter: Secondary | ICD-10-CM | POA: Diagnosis not present

## 2021-09-06 DIAGNOSIS — R600 Localized edema: Secondary | ICD-10-CM | POA: Diagnosis not present

## 2021-09-06 DIAGNOSIS — M7989 Other specified soft tissue disorders: Secondary | ICD-10-CM | POA: Diagnosis not present

## 2021-09-12 DIAGNOSIS — Z6841 Body Mass Index (BMI) 40.0 and over, adult: Secondary | ICD-10-CM | POA: Diagnosis not present

## 2021-09-12 DIAGNOSIS — E1140 Type 2 diabetes mellitus with diabetic neuropathy, unspecified: Secondary | ICD-10-CM | POA: Diagnosis not present

## 2021-09-12 DIAGNOSIS — L97422 Non-pressure chronic ulcer of left heel and midfoot with fat layer exposed: Secondary | ICD-10-CM | POA: Diagnosis not present

## 2021-09-12 DIAGNOSIS — E11621 Type 2 diabetes mellitus with foot ulcer: Secondary | ICD-10-CM | POA: Diagnosis not present

## 2021-09-16 DIAGNOSIS — Z79899 Other long term (current) drug therapy: Secondary | ICD-10-CM | POA: Diagnosis not present

## 2021-09-16 DIAGNOSIS — L97422 Non-pressure chronic ulcer of left heel and midfoot with fat layer exposed: Secondary | ICD-10-CM | POA: Diagnosis not present

## 2021-09-16 DIAGNOSIS — Z7982 Long term (current) use of aspirin: Secondary | ICD-10-CM | POA: Diagnosis not present

## 2021-09-16 DIAGNOSIS — E11621 Type 2 diabetes mellitus with foot ulcer: Secondary | ICD-10-CM | POA: Diagnosis not present

## 2021-09-16 DIAGNOSIS — Z7984 Long term (current) use of oral hypoglycemic drugs: Secondary | ICD-10-CM | POA: Diagnosis not present

## 2021-09-16 DIAGNOSIS — Z882 Allergy status to sulfonamides status: Secondary | ICD-10-CM | POA: Diagnosis not present

## 2021-09-16 DIAGNOSIS — E1140 Type 2 diabetes mellitus with diabetic neuropathy, unspecified: Secondary | ICD-10-CM | POA: Diagnosis not present

## 2021-09-18 DIAGNOSIS — L98492 Non-pressure chronic ulcer of skin of other sites with fat layer exposed: Secondary | ICD-10-CM | POA: Diagnosis not present

## 2021-09-18 DIAGNOSIS — R519 Headache, unspecified: Secondary | ICD-10-CM | POA: Diagnosis not present

## 2021-09-18 DIAGNOSIS — L97509 Non-pressure chronic ulcer of other part of unspecified foot with unspecified severity: Secondary | ICD-10-CM | POA: Diagnosis not present

## 2021-09-18 DIAGNOSIS — E11621 Type 2 diabetes mellitus with foot ulcer: Secondary | ICD-10-CM | POA: Diagnosis not present

## 2021-09-23 DIAGNOSIS — E1140 Type 2 diabetes mellitus with diabetic neuropathy, unspecified: Secondary | ICD-10-CM | POA: Diagnosis not present

## 2021-09-23 DIAGNOSIS — E11621 Type 2 diabetes mellitus with foot ulcer: Secondary | ICD-10-CM | POA: Diagnosis not present

## 2021-09-23 DIAGNOSIS — Z7982 Long term (current) use of aspirin: Secondary | ICD-10-CM | POA: Diagnosis not present

## 2021-09-23 DIAGNOSIS — Z7984 Long term (current) use of oral hypoglycemic drugs: Secondary | ICD-10-CM | POA: Diagnosis not present

## 2021-09-23 DIAGNOSIS — Z79899 Other long term (current) drug therapy: Secondary | ICD-10-CM | POA: Diagnosis not present

## 2021-09-23 DIAGNOSIS — L97422 Non-pressure chronic ulcer of left heel and midfoot with fat layer exposed: Secondary | ICD-10-CM | POA: Diagnosis not present

## 2021-09-23 DIAGNOSIS — Z882 Allergy status to sulfonamides status: Secondary | ICD-10-CM | POA: Diagnosis not present

## 2021-09-24 ENCOUNTER — Ambulatory Visit (INDEPENDENT_AMBULATORY_CARE_PROVIDER_SITE_OTHER): Payer: BC Managed Care – PPO

## 2021-09-24 ENCOUNTER — Encounter: Payer: Self-pay | Admitting: Podiatry

## 2021-09-24 ENCOUNTER — Other Ambulatory Visit: Payer: Self-pay

## 2021-09-24 ENCOUNTER — Ambulatory Visit (INDEPENDENT_AMBULATORY_CARE_PROVIDER_SITE_OTHER): Payer: BC Managed Care – PPO | Admitting: Podiatry

## 2021-09-24 DIAGNOSIS — L97522 Non-pressure chronic ulcer of other part of left foot with fat layer exposed: Secondary | ICD-10-CM

## 2021-09-24 DIAGNOSIS — E08621 Diabetes mellitus due to underlying condition with foot ulcer: Secondary | ICD-10-CM | POA: Diagnosis not present

## 2021-09-24 DIAGNOSIS — E0843 Diabetes mellitus due to underlying condition with diabetic autonomic (poly)neuropathy: Secondary | ICD-10-CM | POA: Diagnosis not present

## 2021-09-24 NOTE — Progress Notes (Signed)
Subjective:  61 y.o. male with PMHx of diabetes mellitus presenting for evaluation of an ulcer that the patient has noticed for about 4 weeks to the left forefoot.  Patient states that he is taking the wound very seriously and collectively has been treated by the wound care center as well as his PCP weekly.  Patient has been packing the wound with silver alginate dressings.  Today he presents wearing hey dude shoes.  He is currently on Augmentin 875/125 mg.  He presents for further treatment and evaluation   Past Medical History:  Diagnosis Date   Arthritis    Asthma    Bipolar affective ([place])    Complication of anesthesia    has woken up during surgery before    Diabetes mellitus without complication ([place])    [id number](833.8)    Hypertension    Hypothyroidism    Pneumonia    PTSD (post-traumatic stress disorder)    Sleep apnea    CPAP   Stroke ([place])    "light" stroke      Objective/Physical Exam General: The patient is alert and oriented x3 in no acute distress.  Dermatology:  Wound #1 noted to the plantar aspect of the left forefoot plantar second MTP measuring approximately 1.7 x 1.2 x 1.0 cm (LxWxD).   To the noted ulceration(s), there is no eschar.  There is an extensive amount of fibrotic tissue and debris within the wound.  After debridement there is a granular wound base.  It does not probe to bone however it does appear to probe to the joint capsule.  There is concern for osteomyelitis of the foot due to the depth of the wound.  No malodor.  No purulence. Skin is warm, dry and supple bilateral lower extremities.  Vascular: Palpable pedal pulses bilaterally.  Skin is warm to touch.  Moderate edema with some mild erythema noted to the forefoot.  No clinical concern for vascular compromise  Neurological: Epicritic and protective threshold diminished bilaterally.   Musculoskeletal Exam: No pedal deformity noted  Radiographic exam: Normal osseous mineralization.  No  radiographic evidence of osteomyelitis or erosion of the cortices.  No fractures identified.  Assessment: 1.  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 diabetes mellitus w/ peripheral neuropathy  Plan of Care:  1. Patient was evaluated. 2. medically necessary excisional debridement including muscle and deep fascial tissue was performed using a tissue nipper and a chisel blade. Excisional debridement of all the necrotic nonviable tissue down to healthy bleeding viable tissue was performed with post-debridement measurements same as pre-. 3. the wound was cleansed and dry sterile dressing applied. 4.  Continue daily dressing changes. 5.  Cam boot dispensed.  Weightbearing in the cam boot as tolerated 6.  Continue oral antibiotic Augmentin 875/125 mg as per prescribing physician 7.  MRI ordered today due to concern for possible osteomyelitis 8.  Continue management at the wound care center as well as with his PCP 9.  Return to clinic after MRI to review the results and discuss further treatment   Edrick Kins, DPM [facility]  Dr. Edrick Kins, DPM    [address], [place] [address]  Office [phone]  Fax [phone]

## 2021-09-25 DIAGNOSIS — M79672 Pain in left foot: Secondary | ICD-10-CM | POA: Diagnosis not present

## 2021-09-25 DIAGNOSIS — Z6841 Body Mass Index (BMI) 40.0 and over, adult: Secondary | ICD-10-CM | POA: Diagnosis not present

## 2021-09-25 DIAGNOSIS — R519 Headache, unspecified: Secondary | ICD-10-CM | POA: Diagnosis not present

## 2021-09-27 DIAGNOSIS — F319 Bipolar disorder, unspecified: Secondary | ICD-10-CM | POA: Diagnosis not present

## 2021-09-30 DIAGNOSIS — Z79899 Other long term (current) drug therapy: Secondary | ICD-10-CM | POA: Diagnosis not present

## 2021-09-30 DIAGNOSIS — E11621 Type 2 diabetes mellitus with foot ulcer: Secondary | ICD-10-CM | POA: Diagnosis not present

## 2021-09-30 DIAGNOSIS — Z7982 Long term (current) use of aspirin: Secondary | ICD-10-CM | POA: Diagnosis not present

## 2021-09-30 DIAGNOSIS — L97422 Non-pressure chronic ulcer of left heel and midfoot with fat layer exposed: Secondary | ICD-10-CM | POA: Diagnosis not present

## 2021-09-30 DIAGNOSIS — Z7984 Long term (current) use of oral hypoglycemic drugs: Secondary | ICD-10-CM | POA: Diagnosis not present

## 2021-09-30 DIAGNOSIS — Z882 Allergy status to sulfonamides status: Secondary | ICD-10-CM | POA: Diagnosis not present

## 2021-09-30 DIAGNOSIS — E1140 Type 2 diabetes mellitus with diabetic neuropathy, unspecified: Secondary | ICD-10-CM | POA: Diagnosis not present

## 2021-10-03 DIAGNOSIS — R0902 Hypoxemia: Secondary | ICD-10-CM | POA: Diagnosis not present

## 2021-10-03 DIAGNOSIS — F10129 Alcohol abuse with intoxication, unspecified: Secondary | ICD-10-CM | POA: Diagnosis not present

## 2021-10-03 DIAGNOSIS — F10120 Alcohol abuse with intoxication, uncomplicated: Secondary | ICD-10-CM | POA: Diagnosis not present

## 2021-10-03 DIAGNOSIS — I6381 Other cerebral infarction due to occlusion or stenosis of small artery: Secondary | ICD-10-CM | POA: Diagnosis not present

## 2021-10-04 ENCOUNTER — Ambulatory Visit
Admission: RE | Admit: 2021-10-04 | Discharge: 2021-10-04 | Disposition: A | Discharge status: Home / Self Care | Payer: BLUE CROSS/BLUE SHIELD | Source: Ambulatory Visit | Attending: Podiatry | Admitting: Podiatry

## 2021-10-04 DIAGNOSIS — L97522 Non-pressure chronic ulcer of other part of left foot with fat layer exposed: Secondary | ICD-10-CM

## 2021-10-04 DIAGNOSIS — L97529 Non-pressure chronic ulcer of other part of left foot with unspecified severity: Secondary | ICD-10-CM | POA: Diagnosis not present

## 2021-10-04 DIAGNOSIS — R600 Localized edema: Secondary | ICD-10-CM | POA: Diagnosis not present

## 2021-10-04 DIAGNOSIS — E0843 Diabetes mellitus due to underlying condition with diabetic autonomic (poly)neuropathy: Secondary | ICD-10-CM

## 2021-10-07 ENCOUNTER — Encounter (HOSPITAL_COMMUNITY): Payer: Self-pay

## 2021-10-07 ENCOUNTER — Emergency Department (HOSPITAL_COMMUNITY): Payer: BC Managed Care – PPO

## 2021-10-07 ENCOUNTER — Inpatient Hospital Stay (HOSPITAL_COMMUNITY)
Admission: EM | Admit: 2021-10-07 | Discharge: 2021-10-11 | DRG: 640 | Disposition: A | Discharge status: Home / Self Care | Payer: BC Managed Care – PPO | Attending: Internal Medicine | Admitting: Internal Medicine

## 2021-10-07 ENCOUNTER — Other Ambulatory Visit: Payer: Self-pay

## 2021-10-07 DIAGNOSIS — Z789 Other specified health status: Secondary | ICD-10-CM | POA: Diagnosis not present

## 2021-10-07 DIAGNOSIS — J45909 Unspecified asthma, uncomplicated: Secondary | ICD-10-CM | POA: Diagnosis not present

## 2021-10-07 DIAGNOSIS — R579 Shock, unspecified: Secondary | ICD-10-CM | POA: Diagnosis not present

## 2021-10-07 DIAGNOSIS — R0602 Shortness of breath: Secondary | ICD-10-CM | POA: Diagnosis not present

## 2021-10-07 DIAGNOSIS — E1169 Type 2 diabetes mellitus with other specified complication: Secondary | ICD-10-CM | POA: Diagnosis present

## 2021-10-07 DIAGNOSIS — Z7984 Long term (current) use of oral hypoglycemic drugs: Secondary | ICD-10-CM

## 2021-10-07 DIAGNOSIS — R571 Hypovolemic shock: Secondary | ICD-10-CM | POA: Diagnosis not present

## 2021-10-07 DIAGNOSIS — E039 Hypothyroidism, unspecified: Secondary | ICD-10-CM | POA: Diagnosis present

## 2021-10-07 DIAGNOSIS — Z20822 Contact with and (suspected) exposure to covid-19: Secondary | ICD-10-CM | POA: Diagnosis present

## 2021-10-07 DIAGNOSIS — R4182 Altered mental status, unspecified: Secondary | ICD-10-CM | POA: Diagnosis not present

## 2021-10-07 DIAGNOSIS — E860 Dehydration: Secondary | ICD-10-CM | POA: Diagnosis not present

## 2021-10-07 DIAGNOSIS — Z96652 Presence of left artificial knee joint: Secondary | ICD-10-CM | POA: Diagnosis present

## 2021-10-07 DIAGNOSIS — E872 Acidosis, unspecified: Secondary | ICD-10-CM | POA: Diagnosis present

## 2021-10-07 DIAGNOSIS — F10929 Alcohol use, unspecified with intoxication, unspecified: Secondary | ICD-10-CM | POA: Diagnosis not present

## 2021-10-07 DIAGNOSIS — Z8249 Family history of ischemic heart disease and other diseases of the circulatory system: Secondary | ICD-10-CM

## 2021-10-07 DIAGNOSIS — I10 Essential (primary) hypertension: Secondary | ICD-10-CM | POA: Diagnosis not present

## 2021-10-07 DIAGNOSIS — F17210 Nicotine dependence, cigarettes, uncomplicated: Secondary | ICD-10-CM | POA: Diagnosis not present

## 2021-10-07 DIAGNOSIS — Y908 Blood alcohol level of 240 mg/100 ml or more: Secondary | ICD-10-CM | POA: Diagnosis present

## 2021-10-07 DIAGNOSIS — G4700 Insomnia, unspecified: Secondary | ICD-10-CM | POA: Diagnosis present

## 2021-10-07 DIAGNOSIS — Z8673 Personal history of transient ischemic attack (TIA), and cerebral infarction without residual deficits: Secondary | ICD-10-CM | POA: Diagnosis not present

## 2021-10-07 DIAGNOSIS — Z7982 Long term (current) use of aspirin: Secondary | ICD-10-CM

## 2021-10-07 DIAGNOSIS — I959 Hypotension, unspecified: Secondary | ICD-10-CM | POA: Diagnosis not present

## 2021-10-07 DIAGNOSIS — U071 COVID-19: Secondary | ICD-10-CM | POA: Diagnosis not present

## 2021-10-07 DIAGNOSIS — Z6841 Body Mass Index (BMI) 40.0 and over, adult: Secondary | ICD-10-CM | POA: Diagnosis not present

## 2021-10-07 DIAGNOSIS — Z833 Family history of diabetes mellitus: Secondary | ICD-10-CM

## 2021-10-07 DIAGNOSIS — F32A Depression, unspecified: Secondary | ICD-10-CM | POA: Diagnosis present

## 2021-10-07 DIAGNOSIS — G4733 Obstructive sleep apnea (adult) (pediatric): Secondary | ICD-10-CM | POA: Diagnosis present

## 2021-10-07 DIAGNOSIS — Z79890 Hormone replacement therapy (postmenopausal): Secondary | ICD-10-CM

## 2021-10-07 DIAGNOSIS — E118 Type 2 diabetes mellitus with unspecified complications: Secondary | ICD-10-CM

## 2021-10-07 DIAGNOSIS — F10229 Alcohol dependence with intoxication, unspecified: Secondary | ICD-10-CM | POA: Diagnosis present

## 2021-10-07 DIAGNOSIS — F4310 Post-traumatic stress disorder, unspecified: Secondary | ICD-10-CM | POA: Diagnosis not present

## 2021-10-07 DIAGNOSIS — R0902 Hypoxemia: Secondary | ICD-10-CM | POA: Diagnosis not present

## 2021-10-07 DIAGNOSIS — F1094 Alcohol use, unspecified with alcohol-induced mood disorder: Secondary | ICD-10-CM | POA: Diagnosis not present

## 2021-10-07 DIAGNOSIS — I4510 Unspecified right bundle-branch block: Secondary | ICD-10-CM | POA: Diagnosis present

## 2021-10-07 DIAGNOSIS — Z888 Allergy status to other drugs, medicaments and biological substances status: Secondary | ICD-10-CM

## 2021-10-07 DIAGNOSIS — F3181 Bipolar II disorder: Secondary | ICD-10-CM | POA: Diagnosis present

## 2021-10-07 DIAGNOSIS — Z882 Allergy status to sulfonamides status: Secondary | ICD-10-CM

## 2021-10-07 DIAGNOSIS — F10920 Alcohol use, unspecified with intoxication, uncomplicated: Secondary | ICD-10-CM | POA: Diagnosis not present

## 2021-10-07 DIAGNOSIS — J9811 Atelectasis: Secondary | ICD-10-CM | POA: Diagnosis not present

## 2021-10-07 DIAGNOSIS — Z91048 Other nonmedicinal substance allergy status: Secondary | ICD-10-CM

## 2021-10-07 DIAGNOSIS — R400 Somnolence: Secondary | ICD-10-CM | POA: Diagnosis not present

## 2021-10-07 DIAGNOSIS — Z7951 Long term (current) use of inhaled steroids: Secondary | ICD-10-CM

## 2021-10-07 DIAGNOSIS — Z79899 Other long term (current) drug therapy: Secondary | ICD-10-CM

## 2021-10-07 DIAGNOSIS — J811 Chronic pulmonary edema: Secondary | ICD-10-CM | POA: Diagnosis not present

## 2021-10-07 HISTORY — DX: Shock, unspecified: R57.9

## 2021-10-07 LAB — COMPREHENSIVE METABOLIC PANEL
ALT: 24 U/L (ref 0–44)
AST: 25 U/L (ref 15–41)
Albumin: 3.3 g/dL — ABNORMAL LOW (ref 3.5–5.0)
Alkaline Phosphatase: 68 U/L (ref 38–126)
Anion gap: 12 (ref 5–15)
BUN: 9 mg/dL (ref 8–23)
CO2: 22 mmol/L (ref 22–32)
Calcium: 8.6 mg/dL — ABNORMAL LOW (ref 8.9–10.3)
Chloride: 101 mmol/L (ref 98–111)
Creatinine, Ser: 0.93 mg/dL (ref 0.61–1.24)
GFR, Estimated: >60 mL/min (ref >60)
Glucose, Bld: 88 mg/dL (ref 70–99)
Potassium: 4 mmol/L (ref 3.5–5.1)
Sodium: 135 mmol/L (ref 135–145)
Total Bilirubin: 0.8 mg/dL (ref 0.3–1.2)
Total Protein: 6.3 g/dL — ABNORMAL LOW (ref 6.5–8.1)

## 2021-10-07 LAB — RAPID URINE DRUG SCREEN, HOSP PERFORMED
Amphetamines: NOT DETECTED
Barbiturates: NOT DETECTED
Benzodiazepines: POSITIVE — AB
Cocaine: NOT DETECTED
Opiates: NOT DETECTED
Tetrahydrocannabinol: NOT DETECTED

## 2021-10-07 LAB — I-STAT VENOUS BLOOD GAS, ED
Acid-base deficit: 4 mmol/L — ABNORMAL HIGH (ref 0.0–2.0)
Bicarbonate: 22.5 mmol/L (ref 20.0–28.0)
Calcium, Ion: 1.12 mmol/L — ABNORMAL LOW (ref 1.15–1.40)
HCT: 42 % (ref 39.0–52.0)
Hemoglobin: 14.3 g/dL (ref 13.0–17.0)
O2 Saturation: 97 %
Potassium: 3.8 mmol/L (ref 3.5–5.1)
Sodium: 136 mmol/L (ref 135–145)
TCO2: 24 mmol/L (ref 22–32)
pCO2, Ven: 43.8 mmHg — ABNORMAL LOW (ref 44–60)
pH, Ven: 7.318 (ref 7.25–7.43)
pO2, Ven: 94 mmHg — ABNORMAL HIGH (ref 32–45)

## 2021-10-07 LAB — CBC WITH DIFFERENTIAL/PLATELET
Abs Immature Granulocytes: 0.08 10*3/uL — ABNORMAL HIGH (ref 0.00–0.07)
Basophils Absolute: 0.1 10*3/uL (ref 0.0–0.1)
Basophils Relative: 1 %
Eosinophils Absolute: 0.5 10*3/uL (ref 0.0–0.5)
Eosinophils Relative: 4 %
HCT: 43.2 % (ref 39.0–52.0)
Hemoglobin: 14.1 g/dL (ref 13.0–17.0)
Immature Granulocytes: 1 %
Lymphocytes Relative: 33 %
Lymphs Abs: 3.4 10*3/uL (ref 0.7–4.0)
MCH: 30.6 pg (ref 26.0–34.0)
MCHC: 32.6 g/dL (ref 30.0–36.0)
MCV: 93.7 fL (ref 80.0–100.0)
Monocytes Absolute: 0.6 10*3/uL (ref 0.1–1.0)
Monocytes Relative: 6 %
Neutro Abs: 5.7 10*3/uL (ref 1.7–7.7)
Neutrophils Relative %: 55 %
Platelets: 292 10*3/uL (ref 150–400)
RBC: 4.61 MIL/uL (ref 4.22–5.81)
RDW: 14.5 % (ref 11.5–15.5)
WBC: 10.4 10*3/uL (ref 4.0–10.5)
nRBC: 0 % (ref 0.0–0.2)

## 2021-10-07 LAB — LACTIC ACID, PLASMA: Lactic Acid, Venous: 3.4 mmol/L (ref 0.5–1.9)

## 2021-10-07 LAB — URINALYSIS, ROUTINE W REFLEX MICROSCOPIC
Bilirubin Urine: NEGATIVE
Glucose, UA: NEGATIVE mg/dL
Hgb urine dipstick: NEGATIVE
Ketones, ur: NEGATIVE mg/dL
Leukocytes,Ua: NEGATIVE
Nitrite: NEGATIVE
Protein, ur: NEGATIVE mg/dL
Specific Gravity, Urine: 1.004 — ABNORMAL LOW (ref 1.005–1.030)
pH: 6 (ref 5.0–8.0)

## 2021-10-07 LAB — BRAIN NATRIURETIC PEPTIDE: B Natriuretic Peptide: 100.6 pg/mL — ABNORMAL HIGH (ref 0.0–100.0)

## 2021-10-07 LAB — TSH: TSH: 2.658 u[IU]/mL (ref 0.350–4.500)

## 2021-10-07 LAB — SALICYLATE LEVEL: Salicylate Lvl: <7 mg/dL — ABNORMAL LOW (ref 7.0–30.0)

## 2021-10-07 LAB — ACETAMINOPHEN LEVEL: Acetaminophen (Tylenol), Serum: <10 ug/mL — ABNORMAL LOW (ref 10–30)

## 2021-10-07 LAB — CBG MONITORING, ED: Glucose-Capillary: 89 mg/dL (ref 70–99)

## 2021-10-07 LAB — ETHANOL: Alcohol, Ethyl (B): 272 mg/dL — ABNORMAL HIGH (ref <10)

## 2021-10-07 LAB — TROPONIN I (HIGH SENSITIVITY)
Troponin I (High Sensitivity): 8 ng/L (ref <18)
Troponin I (High Sensitivity): 9 ng/L (ref <18)

## 2021-10-07 LAB — AMMONIA: Ammonia: 52 umol/L — ABNORMAL HIGH (ref 9–35)

## 2021-10-07 MED ORDER — SODIUM CHLORIDE 0.9 % IV BOLUS
1000.0000 mL | Freq: Once | INTRAVENOUS | Status: AC
Start: 2021-10-07 — End: 2021-10-08
  Administered 2021-10-07: 1000 mL via INTRAVENOUS

## 2021-10-07 MED ORDER — LAMOTRIGINE 100 MG PO TABS
50.0000 mg | ORAL_TABLET | Freq: Every day | ORAL | Status: DC
  Administered 2021-10-08: 50 mg via ORAL
  Filled 2021-10-07: qty 1

## 2021-10-07 MED ORDER — ADULT MULTIVITAMIN W/MINERALS CH
1.0000 | ORAL_TABLET | Freq: Every day | ORAL | Status: DC
  Administered 2021-10-08 – 2021-10-11 (×4): 1 via ORAL
  Filled 2021-10-07 (×4): qty 1

## 2021-10-07 MED ORDER — SODIUM CHLORIDE 0.9 % IV BOLUS
1000.0000 mL | Freq: Once | INTRAVENOUS | Status: AC
  Administered 2021-10-07: 1000 mL via INTRAVENOUS

## 2021-10-07 MED ORDER — LACTATED RINGERS IV BOLUS
1000.0000 mL | Freq: Once | INTRAVENOUS | Status: AC
  Administered 2021-10-07: 1000 mL via INTRAVENOUS

## 2021-10-07 MED ORDER — HEPARIN SODIUM (PORCINE) 5000 UNIT/ML IJ SOLN
5000.0000 [IU] | Freq: Three times a day (TID) | INTRAMUSCULAR | Status: DC
  Administered 2021-10-08 – 2021-10-11 (×10): 5000 [IU] via SUBCUTANEOUS
  Filled 2021-10-07 (×10): qty 1

## 2021-10-07 MED ORDER — VANCOMYCIN HCL 1500 MG/300ML IV SOLN
1500.0000 mg | Freq: Two times a day (BID) | INTRAVENOUS | Status: DC
  Administered 2021-10-08: 1500 mg via INTRAVENOUS
  Filled 2021-10-07: qty 300

## 2021-10-07 MED ORDER — VANCOMYCIN HCL IN DEXTROSE 1-5 GM/200ML-% IV SOLN: 1000.0000 mg | Freq: Once | INTRAVENOUS | Status: DC

## 2021-10-07 MED ORDER — LORAZEPAM 2 MG/ML IJ SOLN
1.0000 mg | INTRAMUSCULAR | Status: DC | PRN
  Administered 2021-10-08: 2 mg via INTRAVENOUS
  Filled 2021-10-07 (×2): qty 1

## 2021-10-07 MED ORDER — THIAMINE HCL 100 MG PO TABS
100.0000 mg | ORAL_TABLET | Freq: Every day | ORAL | Status: DC
  Administered 2021-10-08 – 2021-10-11 (×4): 100 mg via ORAL
  Filled 2021-10-07 (×4): qty 1

## 2021-10-07 MED ORDER — LEVOTHYROXINE SODIUM 75 MCG PO TABS
175.0000 ug | ORAL_TABLET | Freq: Every day | ORAL | Status: DC
  Administered 2021-10-08 – 2021-10-11 (×4): 175 ug via ORAL
  Filled 2021-10-07 (×4): qty 1

## 2021-10-07 MED ORDER — NOREPINEPHRINE 4 MG/250ML-% IV SOLN
0.0000 ug/min | INTRAVENOUS | Status: DC
  Administered 2021-10-07: 2 ug/min via INTRAVENOUS
  Administered 2021-10-08: 5 ug/min via INTRAVENOUS
  Filled 2021-10-07: qty 250

## 2021-10-07 MED ORDER — IOHEXOL 350 MG/ML SOLN
75.0000 mL | Freq: Once | INTRAVENOUS | Status: AC | PRN
  Administered 2021-10-07: 75 mL via INTRAVENOUS

## 2021-10-07 MED ORDER — FAMOTIDINE 20 MG PO TABS
40.0000 mg | ORAL_TABLET | Freq: Every day | ORAL | Status: DC
  Administered 2021-10-08 – 2021-10-11 (×4): 40 mg via ORAL
  Filled 2021-10-07 (×4): qty 2

## 2021-10-07 MED ORDER — SODIUM CHLORIDE 0.9 % IV SOLN
2.0000 g | Freq: Three times a day (TID) | INTRAVENOUS | Status: DC
  Administered 2021-10-08: 2 g via INTRAVENOUS
  Filled 2021-10-07 (×2): qty 2

## 2021-10-07 MED ORDER — FLUOXETINE HCL 20 MG PO CAPS
80.0000 mg | ORAL_CAPSULE | Freq: Every day | ORAL | Status: DC
  Administered 2021-10-08 – 2021-10-10 (×3): 80 mg via ORAL
  Filled 2021-10-07 (×3): qty 4

## 2021-10-07 MED ORDER — TEMAZEPAM 7.5 MG PO CAPS: 7.5000 mg | ORAL_CAPSULE | Freq: Every evening | ORAL | Status: DC | PRN

## 2021-10-07 MED ORDER — POLYETHYLENE GLYCOL 3350 17 G PO PACK
17.0000 g | PACK | Freq: Every day | ORAL | Status: DC | PRN
  Administered 2021-10-09 – 2021-10-10 (×2): 17 g via ORAL
  Filled 2021-10-07 (×2): qty 1

## 2021-10-07 MED ORDER — DOCUSATE SODIUM 100 MG PO CAPS
100.0000 mg | ORAL_CAPSULE | Freq: Two times a day (BID) | ORAL | Status: DC | PRN
  Administered 2021-10-10: 100 mg via ORAL
  Filled 2021-10-07: qty 1

## 2021-10-07 MED ORDER — THIAMINE HCL 100 MG/ML IJ SOLN: 100.0000 mg | Freq: Every day | INTRAMUSCULAR | Status: DC

## 2021-10-07 MED ORDER — GABAPENTIN 100 MG PO CAPS: 800.0000 mg | ORAL_CAPSULE | Freq: Two times a day (BID) | ORAL | Status: DC

## 2021-10-07 MED ORDER — LORAZEPAM 1 MG PO TABS: 1.0000 mg | ORAL_TABLET | ORAL | Status: DC | PRN

## 2021-10-07 MED ORDER — INSULIN ASPART 100 UNIT/ML IJ SOLN
0.0000 [IU] | Freq: Three times a day (TID) | INTRAMUSCULAR | Status: DC
  Administered 2021-10-09: 1 [IU] via SUBCUTANEOUS

## 2021-10-07 MED ORDER — FOLIC ACID 1 MG PO TABS
1.0000 mg | ORAL_TABLET | Freq: Every day | ORAL | Status: DC
  Administered 2021-10-08 – 2021-10-11 (×4): 1 mg via ORAL
  Filled 2021-10-07 (×4): qty 1

## 2021-10-07 MED ORDER — METRONIDAZOLE 500 MG/100ML IV SOLN
500.0000 mg | Freq: Once | INTRAVENOUS | Status: AC
  Administered 2021-10-07: 500 mg via INTRAVENOUS
  Filled 2021-10-07: qty 100

## 2021-10-07 MED ORDER — VANCOMYCIN HCL 10 G IV SOLR
2250.0000 mg | Freq: Once | INTRAVENOUS | Status: AC
  Administered 2021-10-07: 2250 mg via INTRAVENOUS
  Filled 2021-10-07: qty 2250

## 2021-10-07 MED ORDER — SODIUM CHLORIDE 0.9 % IV SOLN
2.0000 g | Freq: Once | INTRAVENOUS | Status: AC
  Administered 2021-10-07: 2 g via INTRAVENOUS
  Filled 2021-10-07: qty 2

## 2021-10-07 MED ORDER — INSULIN ASPART 100 UNIT/ML IJ SOLN: 0.0000 [IU] | Freq: Every day | INTRAMUSCULAR | Status: DC

## 2021-10-07 NOTE — ED Notes (Signed)
Called lab to have TSH, BNP, Trop added to previous collection

## 2021-10-07 NOTE — ED Notes (Signed)
Urinal at bedside.  

## 2021-10-07 NOTE — Progress Notes (Signed)
Pharmacy Antibiotic Note  Rodney Leach is a 61 y.o. male admitted on 10/07/2021 presenting with AMS, concern for sepsis.  Pharmacy has been consulted for vancomycin and cefepime dosing.  Plan: Vancomycin 2250 mg IV x 1, then 1500 mg IV q 12h (eAUC 441, Goal AUC 400-550, SCr 0.93) Cefepime 2g IV q 8h Monitor renal function, Cx and clinical progression to narrow Vancomycin levels as indicated  Height: 6\' 5"  (195.6 cm) Weight: (!) 150.1 kg (330 lb 14.6 oz) IBW/kg (Calculated) : 89.1  Temp (24hrs), Avg:98.1 F (36.7 C), Min:98.1 F (36.7 C), Max:98.1 F (36.7 C)  Recent Labs  Lab 10/07/21 1450  WBC 10.4  CREATININE 0.93    Estimated Creatinine Clearance: 132.2 mL/min (by C-G formula based on SCr of 0.93 mg/dL).    Allergies  Allergen Reactions   Carbamazepine Hives   Adhesive [Tape] Rash   Sulfa Antibiotics Rash   Sulfamethoxazole Rash    Bertis Ruddy, PharmD Clinical Pharmacist ED Pharmacist Phone # [phone] 10/07/2021 4:19 PM

## 2021-10-07 NOTE — Sepsis Progress Note (Signed)
Resumed monitoring of Sepsis Protocol
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
## 2021-10-07 NOTE — Sepsis Progress Note (Signed)
[facility] is following the code sepsis. Thank you
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
## 2021-10-07 NOTE — Sepsis Progress Note (Signed)
Secure Chat Communication took place with current ED RN, 2nd lactic will be drawn before pt goes to assigned bed
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
## 2021-10-08 ENCOUNTER — Inpatient Hospital Stay (HOSPITAL_COMMUNITY): Payer: BC Managed Care – PPO

## 2021-10-08 ENCOUNTER — Ambulatory Visit: Payer: BC Managed Care – PPO | Admitting: Podiatry

## 2021-10-08 DIAGNOSIS — R579 Shock, unspecified: Secondary | ICD-10-CM

## 2021-10-08 DIAGNOSIS — R571 Hypovolemic shock: Secondary | ICD-10-CM | POA: Diagnosis not present

## 2021-10-08 DIAGNOSIS — F10920 Alcohol use, unspecified with intoxication, uncomplicated: Secondary | ICD-10-CM

## 2021-10-08 LAB — CBC WITH DIFFERENTIAL/PLATELET
Abs Immature Granulocytes: 0.08 10*3/uL — ABNORMAL HIGH (ref 0.00–0.07)
Basophils Absolute: 0.1 10*3/uL (ref 0.0–0.1)
Basophils Relative: 2 %
Eosinophils Absolute: 0.3 10*3/uL (ref 0.0–0.5)
Eosinophils Relative: 5 %
HCT: 43.2 % (ref 39.0–52.0)
Hemoglobin: 14.5 g/dL (ref 13.0–17.0)
Immature Granulocytes: 1 %
Lymphocytes Relative: 28 %
Lymphs Abs: 1.8 10*3/uL (ref 0.7–4.0)
MCH: 31 pg (ref 26.0–34.0)
MCHC: 33.6 g/dL (ref 30.0–36.0)
MCV: 92.3 fL (ref 80.0–100.0)
Monocytes Absolute: 0.6 10*3/uL (ref 0.1–1.0)
Monocytes Relative: 8 %
Neutro Abs: 3.7 10*3/uL (ref 1.7–7.7)
Neutrophils Relative %: 56 %
Platelets: 259 10*3/uL (ref 150–400)
RBC: 4.68 MIL/uL (ref 4.22–5.81)
RDW: 14.6 % (ref 11.5–15.5)
WBC: 6.5 10*3/uL (ref 4.0–10.5)
nRBC: 0 % (ref 0.0–0.2)

## 2021-10-08 LAB — ECHOCARDIOGRAM COMPLETE
AR max vel: 3.85 cm2
AV Area VTI: 4.44 cm2
AV Area mean vel: 3.68 cm2
AV Mean grad: 4 mmHg
AV Peak grad: 7.1 mmHg
Ao pk vel: 1.34 m/s
Area-P 1/2: 3.85 cm2
Calc EF: 31.8 %
Height: 77 in
S' Lateral: 3.1 cm
Single Plane A2C EF: 27.3 %
Single Plane A4C EF: 40.4 %
Weight: 5439.19 oz

## 2021-10-08 LAB — COMPREHENSIVE METABOLIC PANEL
ALT: 28 U/L (ref 0–44)
AST: 27 U/L (ref 15–41)
Albumin: 3.2 g/dL — ABNORMAL LOW (ref 3.5–5.0)
Alkaline Phosphatase: 72 U/L (ref 38–126)
Anion gap: 11 (ref 5–15)
BUN: 7 mg/dL — ABNORMAL LOW (ref 8–23)
CO2: 23 mmol/L (ref 22–32)
Calcium: 8.5 mg/dL — ABNORMAL LOW (ref 8.9–10.3)
Chloride: 104 mmol/L (ref 98–111)
Creatinine, Ser: 0.83 mg/dL (ref 0.61–1.24)
GFR, Estimated: >60 mL/min (ref >60)
Glucose, Bld: 124 mg/dL — ABNORMAL HIGH (ref 70–99)
Potassium: 4 mmol/L (ref 3.5–5.1)
Sodium: 138 mmol/L (ref 135–145)
Total Bilirubin: 0.5 mg/dL (ref 0.3–1.2)
Total Protein: 6.4 g/dL — ABNORMAL LOW (ref 6.5–8.1)

## 2021-10-08 LAB — LACTIC ACID, PLASMA
Lactic Acid, Venous: 2.7 mmol/L (ref 0.5–1.9)
Lactic Acid, Venous: 2.8 mmol/L (ref 0.5–1.9)

## 2021-10-08 LAB — BLOOD GAS, VENOUS
Acid-base deficit: 2.4 mmol/L — ABNORMAL HIGH (ref 0.0–2.0)
Bicarbonate: 23.2 mmol/L (ref 20.0–28.0)
O2 Saturation: 83.4 %
Patient temperature: 37
pCO2, Ven: 42 mmHg — ABNORMAL LOW (ref 44–60)
pH, Ven: 7.35 (ref 7.25–7.43)
pO2, Ven: 51 mmHg — ABNORMAL HIGH (ref 32–45)

## 2021-10-08 LAB — GLUCOSE, CAPILLARY
Glucose-Capillary: 153 mg/dL — ABNORMAL HIGH (ref 70–99)
Glucose-Capillary: 113 mg/dL — ABNORMAL HIGH (ref 70–99)
Glucose-Capillary: 118 mg/dL — ABNORMAL HIGH (ref 70–99)
Glucose-Capillary: 128 mg/dL — ABNORMAL HIGH (ref 70–99)
Glucose-Capillary: 140 mg/dL — ABNORMAL HIGH (ref 70–99)

## 2021-10-08 LAB — MAGNESIUM: Magnesium: 1.5 mg/dL — ABNORMAL LOW (ref 1.7–2.4)

## 2021-10-08 LAB — HEMOGLOBIN A1C
Hgb A1c MFr Bld: 6.4 % — ABNORMAL HIGH (ref 4.8–5.6)
Mean Plasma Glucose: 136.98 mg/dL

## 2021-10-08 LAB — HIV ANTIBODY (ROUTINE TESTING W REFLEX): HIV Screen 4th Generation wRfx: NONREACTIVE

## 2021-10-08 LAB — RESP PANEL BY RT-PCR (FLU A&B, COVID) ARPGX2
Influenza A by PCR: NEGATIVE
Influenza B by PCR: NEGATIVE
SARS Coronavirus 2 by RT PCR: NEGATIVE

## 2021-10-08 LAB — MRSA NEXT GEN BY PCR, NASAL: MRSA by PCR Next Gen: NOT DETECTED

## 2021-10-08 LAB — PHOSPHORUS: Phosphorus: 2.6 mg/dL (ref 2.5–4.6)

## 2021-10-08 MED ORDER — PHENOBARBITAL 32.4 MG PO TABS
64.8000 mg | ORAL_TABLET | Freq: Three times a day (TID) | ORAL | Status: AC
  Administered 2021-10-08 – 2021-10-09 (×5): 64.8 mg via ORAL
  Filled 2021-10-08 (×5): qty 2

## 2021-10-08 MED ORDER — GABAPENTIN 400 MG PO CAPS
400.0000 mg | ORAL_CAPSULE | Freq: Three times a day (TID) | ORAL | Status: DC
  Administered 2021-10-08 – 2021-10-11 (×8): 400 mg via ORAL
  Filled 2021-10-08 (×8): qty 1

## 2021-10-08 MED ORDER — LORAZEPAM 2 MG/ML IJ SOLN
1.0000 mg | INTRAMUSCULAR | Status: DC | PRN
  Administered 2021-10-08: 1 mg via INTRAVENOUS
  Filled 2021-10-08: qty 1

## 2021-10-08 MED ORDER — LAMOTRIGINE 25 MG PO TABS
25.0000 mg | ORAL_TABLET | Freq: Every day | ORAL | Status: DC
  Administered 2021-10-09 – 2021-10-10 (×2): 25 mg via ORAL
  Filled 2021-10-08 (×2): qty 1

## 2021-10-08 MED ORDER — AMLODIPINE BESYLATE 5 MG PO TABS
5.0000 mg | ORAL_TABLET | Freq: Every day | ORAL | Status: DC
Start: 2021-10-08 — End: 2021-10-11
  Administered 2021-10-08 – 2021-10-11 (×4): 5 mg via ORAL
  Filled 2021-10-08 (×4): qty 1

## 2021-10-08 MED ORDER — PERFLUTREN LIPID MICROSPHERE
1.0000 mL | INTRAVENOUS | Status: AC | PRN
  Administered 2021-10-08: 2 mL via INTRAVENOUS
  Filled 2021-10-08: qty 10

## 2021-10-08 MED ORDER — ROSUVASTATIN CALCIUM 5 MG PO TABS
5.0000 mg | ORAL_TABLET | Freq: Every day | ORAL | Status: DC
  Administered 2021-10-08 – 2021-10-10 (×2): 5 mg via ORAL
  Filled 2021-10-08 (×3): qty 1

## 2021-10-08 MED ORDER — OXYCODONE HCL 5 MG PO TABS
5.0000 mg | ORAL_TABLET | Freq: Four times a day (QID) | ORAL | Status: DC | PRN
  Administered 2021-10-08 – 2021-10-09 (×2): 5 mg via ORAL
  Filled 2021-10-08 (×2): qty 1

## 2021-10-08 MED ORDER — MAGNESIUM SULFATE 4 GM/100ML IV SOLN
4.0000 g | Freq: Once | INTRAVENOUS | Status: AC
  Administered 2021-10-08: 4 g via INTRAVENOUS
  Filled 2021-10-08: qty 100

## 2021-10-08 MED ORDER — CHLORHEXIDINE GLUCONATE CLOTH 2 % EX PADS
6.0000 | MEDICATED_PAD | Freq: Every day | CUTANEOUS | Status: DC
  Administered 2021-10-08 – 2021-10-09 (×3): 6 via TOPICAL

## 2021-10-08 MED ORDER — PHENOBARBITAL 32.4 MG PO TABS
32.4000 mg | ORAL_TABLET | Freq: Three times a day (TID) | ORAL | Status: DC
  Administered 2021-10-10 – 2021-10-11 (×4): 32.4 mg via ORAL
  Filled 2021-10-08 (×4): qty 1

## 2021-10-08 MED ORDER — LACTATED RINGERS IV BOLUS: 500.0000 mL | Freq: Once | INTRAVENOUS | Status: DC

## 2021-10-08 MED ORDER — ASPIRIN EC 81 MG PO TBEC
81.0000 mg | DELAYED_RELEASE_TABLET | Freq: Every day | ORAL | Status: DC
  Administered 2021-10-09 – 2021-10-11 (×3): 81 mg via ORAL
  Filled 2021-10-08 (×3): qty 1

## 2021-10-08 MED ORDER — PHENOBARBITAL 32.4 MG PO TABS: 64.8000 mg | ORAL_TABLET | Freq: Three times a day (TID) | ORAL | Status: DC

## 2021-10-08 MED ORDER — LOSARTAN POTASSIUM 50 MG PO TABS
100.0000 mg | ORAL_TABLET | Freq: Every day | ORAL | Status: DC
  Administered 2021-10-08 – 2021-10-10 (×3): 100 mg via ORAL
  Filled 2021-10-08 (×3): qty 2

## 2021-10-08 MED ORDER — PHENOBARBITAL 32.4 MG PO TABS: 32.4000 mg | ORAL_TABLET | Freq: Three times a day (TID) | ORAL | Status: DC

## 2021-10-08 NOTE — Progress Notes (Signed)
61 yo PMHx of Bipolar d/o, etoh abuse, tobacco use sent to ED by police after being found with empty alcohol bottle in car.   In ED, found to be hypotensive, received 2 L NS and required low dose levophed to maintain BP.   Utox  + for  benzos, ETOH level 272 mg/dL, CT Chest with minimal bibasilar opacities . CT head without acute findings.    Has foot wound followed by outpatient wound care . Has been on oxycodone 5mg  BID  Plans:  - shock, hypovolemic, possibly vasoplegic/ septic superimposed on polysubstance use & polypharmacy  -- pan cultured and broad abx ordered  - got 3L total IVF  - levophed for MAP goal > 65 mmHG  - high risk for ETOH withdrawal once levels come down  -on CIWA, low threshold to start Gabapentin +/- Precedex  - resumed home meds for bipolar  - cautious resumption of oxycodone, patient requesting for pain from right foot

## 2021-10-08 NOTE — Progress Notes (Signed)
NAME:  Rodney Leach, MRN:  [id number], DOB:  1960/11/19, LOS: 1 ADMISSION DATE:  10/07/2021, CONSULTATION DATE:  10/08/2021  REFERRING MD:  Jacky Kindle, MD CHIEF COMPLAINT: intoxication  History of Present Illness:  Pt is a 61 yo WM with PMHx of Bipolar d/o, etoh abuse, tobacco use sent to ED by police after being found in median of road with empty alcohol bottle in car. Pt reports long hx of ETOH use, was sober for several years until about 1 month ago. Reports having difficulty managing bipolar d/o over last month leading to [facility] Use. Currently using several pints of hard liquor daily, smokes 1/2 PPD since age 29.  In ED, found to be hypotensive, received 2 L NS and required low dose levophed to maintain BP.   UDS + benzos, ETOH level 272 mg/dL, CT Chest with minimal bibasilar opacities but overall unrevealing. CT head without acute findings.  ICU called for management of persistent shock.  I performed bedside POCUS, difficult windows, best obtained subxiphoid 4 chamber without effusion, obviously wall motion abnormality, overall normal appearing on limited visual assessment.  CT images personally reviewed - minimal bibasilar opacities, thickened, patulous esophagus noted. CT head personally reviewed - no hemorrhage, acute abnormality noted.   Long discussion with wife on phone. She reports pt excess drinking for several months. Follows with psych in [place] and 2 week sago was taken off Seroquel and started on Lamotrigine and Temazepam PRN. Pt has been taking temazepam nightly for the last several weeks. She would like psych involvement while inpatient.  He has a foot wound that is being followed by outpatient wound care & was taking oxycodone 5mg  BID for last several days.   He denies CP, SOB,  fever, chills, nausea, vomiting, abdominal pain, no dysuria, no edema to extremities, no focal weakness. Has chronic non productive cough   Pertinent  Medical History  Bipolar disorder  Tobacco abuse  with history of etoh withdrawal without seizures  Alcohol abuse Hypertension  Diabetes Mellitus  Hypothyroid   Significant Hospital Events: Including procedures, antibiotic start and stop dates in addition to other pertinent events   Admitted for persistent shock.  2/28 Off pressors this AM, awake and alert  Interim History / Subjective:  Off pressors and now hypertensive after IVF overnight, lactic clearing Awake and oriented, only complaint is hunger  Objective   Blood pressure (!) 165/96, pulse 78, temperature 97.9 F (36.6 C), temperature source Oral, resp. rate (!) 8, height 6\' 5"  (1.956 m), weight (!) 154.2 kg, SpO2 97 %.        Intake/Output Summary (Last 24 hours) at 10/08/2021 0843 Last data filed at 10/08/2021 7673 Gross per 24 hour  Intake 2118.51 ml  Output 2050 ml  Net 68.51 ml    Filed Weights   10/07/21 1426 10/08/21 0113  Weight: (!) 150.1 kg (!) 154.2 kg    General:  overweight M, awake and alert, no distress on [place] HEENT: MM pink/moist, sclera anicteric, pupils equal Neuro: awake and alert, oriented x3, moving all extremities without focal deficits CV: s1s2 rrr, no m/r/g PULM:  clear bilaterally without wheezing or rhonchi GI: soft, bsx4 active  Extremities: warm/dry, no edema  Skin: no rashes or lesions   Resolved Hospital Problem list   Hypotension  Assessment & Plan:     Hypotension, hypovolemic shock - suspect hypovolemic, in setting of polysubstance use & polypharmacy (EtOH, benzos, meds), poor po intake in setting of lactic acidosis. Bedside POCUS was obvious cardiac  abnormality  -off pressors after IVF -lactic acid clearing -follow cultures -echo pending -stable for transfer out of the ICU    Etoh Abuse  ETOH level 272 - high risk for withdrawal and starting to feel anxious this AM, discussed with pharmacy and will start low dose phenobarbital - start CIWA, symptom triggered for now, likely will need scheduled   - MVI, folate,  famotidine   Bipolar D/O  - resume home meds  - per wife is taking as RX'd, had med change 1 week ago with psych MD in [place]  -psych consult  Lactic acidosis  -improving - in setting of hypotension, metformin use   DM2 - hold home metformin  - SSI for now  - update A1C with next lab set   Hypothryoid - continue home synthroid   Hypertension - hold home meds  Tobacco use - 45+ PYH  - nicotine patch PRN  - counseled on importance of cessation   Best Practice (right click and "Reselect all SmartList Selections" daily)   Diet/type: Regular consistency (see orders) DVT prophylaxis: prophylactic heparin  GI prophylaxis: N/A Lines: N/A Foley:  N/A Code Status:  full code Last date of multidisciplinary goals of care discussion  Labs   CBC: Recent Labs  Lab 10/07/21 1450 10/07/21 1604 10/08/21 0213  WBC 10.4  --  6.5  NEUTROABS 5.7  --  3.7  HGB 14.1 14.3 14.5  HCT 43.2 42.0 43.2  MCV 93.7  --  92.3  PLT 292  --  259     Basic Metabolic Panel: Recent Labs  Lab 10/07/21 1450 10/07/21 1604 10/08/21 0213  NA 135 136 138  K 4.0 3.8 4.0  CL 101  --  104  CO2 22  --  23  GLUCOSE 88  --  124*  BUN 9  --  7*  CREATININE 0.93  --  0.83  CALCIUM 8.6*  --  8.5*  MG  --   --  1.5*  PHOS  --   --  2.6    GFR: Estimated Creatinine Clearance: 150.2 mL/min (by C-G formula based on SCr of 0.83 mg/dL). Recent Labs  Lab 10/07/21 1450 10/07/21 1849 10/07/21 2350 10/08/21 0213  WBC 10.4  --   --  6.5  LATICACIDVEN  --  3.4* 2.7* 2.8*     Liver Function Tests: Recent Labs  Lab 10/07/21 1450 10/08/21 0213  AST 25 27  ALT 24 28  ALKPHOS 68 72  BILITOT 0.8 0.5  PROT 6.3* 6.4*  ALBUMIN 3.3* 3.2*    No results for input(s): LIPASE, AMYLASE in the last 168 hours. Recent Labs  Lab 10/07/21 1450  AMMONIA 52*     ABG    Component Value Date/Time   PHART 7.383 01/14/2013 2202   PCO2ART 41.6 01/14/2013 2202   PO2ART 63.0 (L) 01/14/2013 2202   HCO3  23.2 10/08/2021 0213   TCO2 24 10/07/2021 1604   ACIDBASEDEF 2.4 (H) 10/08/2021 0213   O2SAT 83.4 10/08/2021 0213      Coagulation Profile: No results for input(s): INR, PROTIME in the last 168 hours.  Cardiac Enzymes: No results for input(s): CKTOTAL, CKMB, CKMBINDEX, TROPONINI in the last 168 hours.  HbA1C: Hgb A1c MFr Bld  Date/Time Value Ref Range Status  10/08/2021 02:13 AM 6.4 (H) 4.8 - 5.6 % Final    Comment:    (NOTE) Pre diabetes:          5.7%-6.4%  Diabetes:              >  6.4%  Glycemic control for   <7.0% adults with diabetes   09/02/2012 06:10 AM 6.7 (H) <5.7 % Final    Comment:    (NOTE)                                                                       According to the ADA Clinical Practice Recommendations for 2011, when HbA1c is used as a screening test:  >=6.5%   Diagnostic of Diabetes Mellitus           (if abnormal result is confirmed) 5.7-6.4%   Increased risk of developing Diabetes Mellitus References:Diagnosis and Classification of Diabetes Mellitus,Diabetes ZDGL,8756,43(PIRJJ 1):S62-S69 and Standards of Medical Care in         Diabetes - 2011,Diabetes OACZ,6606,30 (Suppl 1):S11-S61.    CBG: Recent Labs  Lab 10/07/21 1500 10/08/21 0059 10/08/21 0719  GLUCAP 89 153* 113*     Review of Systems:   Please see the history of present illness. All other systems reviewed and are negative    Past Medical History:  He,  has a past medical history of Arthritis, Asthma, Bipolar affective ([place]), Complication of anesthesia, Diabetes mellitus without complication ([place]), ZSWFUXNA(355.7), Hypertension, Hypothyroidism, Pneumonia, PTSD (post-traumatic stress disorder), Sleep apnea, and Stroke ([place]).   Surgical History:   Past Surgical History:  Procedure Laterality Date   BACK SURGERY     fusion L5/S1   COLONOSCOPY WITH PROPOFOL N/A 09/03/2017   Procedure: COLONOSCOPY WITH PROPOFOL;  Surgeon: Laurence Spates, MD;  Location: [facility];  Service:  Endoscopy;  Laterality: N/A;   HAND SURGERY     R & L    HERNIA REPAIR     umbilical   MULTIPLE TOOTH EXTRACTIONS     NOSE SURGERY     fracture repair   TOTAL KNEE ARTHROPLASTY  2012   Right   TOTAL KNEE ARTHROPLASTY  04/07/2012   Procedure: TOTAL KNEE ARTHROPLASTY;  Surgeon: Ninetta Lights, MD;  Location: [facility];  Service: Orthopedics;  Laterality: Left;  left total knee arthroplasty     Social History:   reports that he has been smoking cigarettes. He has been smoking an average of .5 packs per day. He has quit using smokeless tobacco.  His smokeless tobacco use included chew. He reports current alcohol use. He reports that he does not use drugs.   Family History:  His family history includes Diabetes in his mother; Hypertension in his father and mother.   Allergies Allergies  Allergen Reactions   Carbamazepine Hives   Adhesive [Tape] Rash   Sulfa Antibiotics Rash   Sulfamethoxazole Rash     Home Medications  Prior to Admission medications   Medication Sig Start Date End Date Taking? Authorizing Provider  amLODipine (NORVASC) 5 MG tablet amlodipine 5 mg tablet    [provider]  amoxicillin-clavulanate (AUGMENTIN) 875-125 MG tablet Take 1 tablet by mouth 2 (two) times daily. 09/16/21   [provider]  aspirin 81 MG EC tablet Take by mouth.    [provider]  b complex vitamins tablet Take 1 tablet by mouth daily.    [provider]  BREO ELLIPTA 100-25 MCG/INH AEPB Inhale 1 puff into the lungs daily. 08/12/17   [provider]  buPROPion (WELLBUTRIN XL) 150 MG 24 hr tablet Take 150 mg by mouth every morning. 09/22/21   [provider]  FLUoxetine (PROZAC) 40 MG capsule Take by mouth. 07/25/21   [provider]  fluticasone (FLONASE) 50 MCG/ACT nasal spray Place 2 sprays into both nostrils 3 (three) times daily as needed for allergies. 08/10/17   [provider]  gabapentin (NEURONTIN) 400 MG capsule Take 1  capsule (400 mg total) by mouth 4 (four) times daily. Patient taking differently: Take 800 mg by mouth See admin instructions. Take 1600 mg by mouth in the morning, take 800 mg by mouth at lunch and take 800 mg by mouth in the evening 09/16/12   Patrecia Pour, NP  gabapentin (NEURONTIN) 800 MG tablet gabapentin 800 mg tablet    [provider]  HYDROcodone-acetaminophen (NORCO/VICODIN) 5-325 MG per tablet Take 1-2 tablets by mouth every 4 (four) hours as needed for moderate pain or severe pain. Patient not taking: Reported on 09/01/2017 03/06/14   Clayton Bibles, PA-C  levothyroxine (SYNTHROID, LEVOTHROID) 175 MCG tablet Take 1 tablet (175 mcg total) by mouth daily. 09/16/12   Patrecia Pour, NP  losartan (COZAAR) 100 MG tablet Take 100 mg by mouth daily. 08/05/17   [provider]  losartan (COZAAR) 100 MG tablet Take 1 tablet by mouth daily. 04/04/19   [provider]  lurasidone (LATUDA) 80 MG TABS tablet Take 1 tablet by mouth daily. 12/26/19   [provider]  metFORMIN (GLUCOPHAGE) 1000 MG tablet Take 1,000 mg by mouth 2 (two) times daily. 09/03/21   [provider]  methocarbamol (ROBAXIN) 750 MG tablet Take by mouth.    [provider]  MOVANTIK 25 MG TABS tablet Take 25 mg by mouth daily. 07/23/17   [provider]  OLANZapine (ZYPREXA) 5 MG tablet take 1 tablet by oral route  at bedtime 07/25/21   [provider]  Oxycodone HCl 10 MG TABS Take 10 mg by mouth every 4 (four) hours.    [provider]  QUEtiapine (SEROQUEL XR) 300 MG 24 hr tablet Take 1 tablet (300 mg total) by mouth daily at 8 pm. Patient not taking: Reported on 09/01/2017 09/16/12   Patrecia Pour, NP  rosuvastatin (CRESTOR) 5 MG tablet Take 5 mg by mouth at bedtime. 07/10/21   [provider]  sertraline (ZOLOFT) 100 MG tablet Take 100 mg by mouth 2 (two) times daily.    [provider]  sertraline (ZOLOFT) 100 MG tablet Take by mouth.  04/15/18   [provider]  traZODone (DESYREL) 100 MG tablet Take 200 mg by mouth at bedtime.    [provider]  Vitamin D, Ergocalciferol, (DRISDOL) 50000 units CAPS capsule Take 50,000 Units by mouth every Saturday. 08/07/17   [provider]  zolpidem (AMBIEN) 5 MG tablet Take 1 tablet (5 mg total) by mouth at bedtime as needed for sleep. Patient not taking: Reported on 09/01/2017 09/16/12   Patrecia Pour, NP     Critical care time: 40 minutes      Otilio Carpen Jamarian Jacinto, PA-C [facility] See Amion for pager If no response to pager , please call [phone] until 7pm After 7:00 pm call Elink  [phone]?[place]

## 2021-10-08 NOTE — Consult Note (Signed)
Brief Psychiatry Consult Note  Briefly reviewed chart; will decrease lamotrigine to 25 mg (has been on for <2 weeks). Will see pt tomorrow for full consult; communicated to primary team.   Kerrie [place] Ingrum

## 2021-10-08 NOTE — Progress Notes (Signed)
Arrived to place [facility] for pressors. Primary RN stated that pressors not being initiated at this time and that she would reconsult should they need to be restarted. VAST team to follow.

## 2021-10-08 NOTE — Progress Notes (Signed)
Pharmacy Phenobarbital Consult Note   Pharmacy Consult for PO Phenobarbital   Indication: Alcohol Withdrawal    Labs:  Lab Results  Component Value Date   CREATININE 0.83 10/08/2021   AST 27 10/08/2021   ALT 28 10/08/2021   ALKPHOS 72 10/08/2021    Nursing Bedside Screening:   AUDIT-C:12  PAWSS: 8  Last known drink: Day of admit, BAC 272 on admit Bedside assessment completed by Army Fossa on 2/28   Assessment: Rodney Leach is a 61 y.o. year old male admitted on 10/07/2021. Patients meets criteria for low risk dosing of phenobarbital. Pharmacy consulted to dose phenobarbital.    Plan: Start low risk taper PO: Phenobarbital 64.8mg  oral q 8h x 6 doses followed by Phenobarbital 32.4mg  oral q 8h x 6 doses Start Lorazepam 1mg  IV q4h prn agitation     Thank you for allowing pharmacy to participate in this patient's care.  Rodney Leach 10/08/2021,10:53 AM

## 2021-10-08 NOTE — Progress Notes (Signed)
° °  Echocardiogram 2D Echocardiogram has been performed.  Beryle Beams 10/08/2021, 11:05 AM

## 2021-10-08 NOTE — Consult Note (Addendum)
[facility] Nurse Consult Note: Reason for Consult: Consult requested for left foot wound.  Pt states he is followed by an outpatient wound care center and they have ordered Aquacel dressings daily. Wound type: Left plantar foot with chronic full thickness wound; 1X.3X.5cm, red moist wound bed, mod amt tan drainage, dry yellow calloused edges surrounding the wound. Dressing procedure/placement/frequency: Continue plan of care as previously ordered by the outpatient wound care center. Topical treatment orders provided for bedside nurses to perform to absorb drainage and provide antimicrobial benefits: Tuck piece of Aquacel Kellie Simmering # [phone]) into left foot wound Q day, using swab to fill, then cover with foam dressing.  (Change foam dressing Q 3 days or PRN soiling.)  Moisten previous dressing each time with NS to remove. Pt states he will resume follow-up with the outpatient wound care center after discharge. Please re-consult if further assistance is needed.  Thank-you,  Julien Girt MSN, [place], [place], [place], [place]

## 2021-10-08 NOTE — Progress Notes (Signed)
[facility] ADULT ICU REPLACEMENT PROTOCOL   The patient does apply for the [facility] Adult ICU Electrolyte Replacment Protocol based on the criteria listed below:   1.Exclusion criteria: TCTS patients, ECMO patients, and Dialysis patients 2. Is GFR >/= 30 ml/min? Yes.    Patient's GFR today is >60 3. Is SCr </= 2? Yes.   Patient's SCr is 0.83 mg/dL 4. Did SCr increase >/= 0.5 in 24 hours? No. 5.Pt's weight >40kg  Yes.   6. Abnormal electrolyte(s): mag 1.5  7. Electrolytes replaced per protocol 8.  Call MD STAT for K+ </= 2.5, Phos </= 1, or Mag </= 1 Physician:  n/a  Darlys Gales 10/08/2021 4:44 AM

## 2021-10-09 DIAGNOSIS — E118 Type 2 diabetes mellitus with unspecified complications: Secondary | ICD-10-CM

## 2021-10-09 DIAGNOSIS — G4733 Obstructive sleep apnea (adult) (pediatric): Secondary | ICD-10-CM | POA: Diagnosis present

## 2021-10-09 DIAGNOSIS — I10 Essential (primary) hypertension: Secondary | ICD-10-CM

## 2021-10-09 HISTORY — DX: Essential (primary) hypertension: I10

## 2021-10-09 HISTORY — DX: Obstructive sleep apnea (adult) (pediatric): G47.33

## 2021-10-09 HISTORY — DX: Hypomagnesemia: E83.42

## 2021-10-09 HISTORY — DX: Type 2 diabetes mellitus with unspecified complications: E11.8

## 2021-10-09 LAB — GLUCOSE, CAPILLARY
Glucose-Capillary: 183 mg/dL — ABNORMAL HIGH (ref 70–99)
Glucose-Capillary: 123 mg/dL — ABNORMAL HIGH (ref 70–99)
Glucose-Capillary: 121 mg/dL — ABNORMAL HIGH (ref 70–99)
Glucose-Capillary: 121 mg/dL — ABNORMAL HIGH (ref 70–99)

## 2021-10-09 MED ORDER — OXYCODONE HCL 5 MG PO TABS
10.0000 mg | ORAL_TABLET | Freq: Four times a day (QID) | ORAL | Status: DC | PRN
  Administered 2021-10-09 – 2021-10-11 (×5): 10 mg via ORAL
  Filled 2021-10-09 (×5): qty 2

## 2021-10-09 MED ORDER — MAGNESIUM SULFATE 2 GM/50ML IV SOLN
2.0000 g | Freq: Once | INTRAVENOUS | Status: AC
  Administered 2021-10-09: 2 g via INTRAVENOUS
  Filled 2021-10-09: qty 50

## 2021-10-09 MED ORDER — LORAZEPAM 1 MG PO TABS: 1.0000 mg | ORAL_TABLET | ORAL | Status: DC | PRN

## 2021-10-09 NOTE — TOC Initial Note (Signed)
Transition of Care (TOC) - Initial/Assessment Note  ? ? ?Patient Details  ?Name: Rodney Leach ?MRN: [id number] ?Date of Birth: Apr 24, 1961 ? ?Transition of Care (TOC) CM/SW Contact:    ?Tom-Johnson, Renea Ee, RN ?Phone Number: ?10/09/2021, 3:16 PM ? ?Clinical Narrative:                 ? ?CM spoke with patient at bedside about needs for post hospital transition. Admitted for Septic shock. States he is from home with wife. Has seven adult children. Mother still living and has three siblings. Had family support at discharge. Was self employed and currently retired. Has a cane, walker, shower seat at home. Currently on oxygen but does not use oxygen at home. PCP is Hamrick, Lorin Mercy, MD and [facility] [place]. Awaiting PT/OT eval and recommendations for disposition. CM will continue to follow with needs. ?  ?Barriers to Discharge: Continued Medical Work up ? ? ?Patient Goals and CMS Choice ?Patient states their goals for this hospitalization and ongoing recovery are:: To return home ?CMS Medicare.gov Compare Post Acute Care list provided to:: Patient ?  ? ?Expected Discharge Plan and Services ?  ?  ?Discharge Planning Services: CM Consult ?  ?Living arrangements for the past 2 months: [place] ?                ?  ?  ?  ?  ?  ?  ?  ?  ?  ?  ? ?Prior Living Arrangements/Services ?Living arrangements for the past 2 months: [place] ?Lives with:: Spouse ?Patient language and need for interpreter reviewed:: Yes ?Do you feel safe going back to the place where you live?: Yes      ?Need for Family Participation in Patient Care: Yes (Comment) ?Care giver support system in place?: Yes (comment) ?  ?Criminal Activity/Legal Involvement Pertinent to Current Situation/Hospitalization: No - Comment as needed ? ?Activities of Daily Living ?Home Assistive Devices/Equipment: None ?ADL Screening (condition at time of admission) ?Patient's cognitive ability adequate to safely complete daily activities?:  Yes ?Is the patient deaf or have difficulty hearing?: No ?Does the patient have difficulty seeing, even when wearing glasses/contacts?: No ?Does the patient have difficulty concentrating, remembering, or making decisions?: No ?Patient able to express need for assistance with ADLs?: Yes ?Does the patient have difficulty dressing or bathing?: No ?Independently performs ADLs?: Yes (appropriate for developmental age) ?Does the patient have difficulty walking or climbing stairs?: No ?Weakness of Legs: None ?Weakness of Arms/Hands: None ? ?Permission Sought/Granted ?Permission sought to share information with : Case Manager, Customer service manager, Family Supports ?Permission granted to share information with : Yes, Verbal Permission Granted ?   ?   ?   ?   ? ?Emotional Assessment ?Appearance:: Appears stated age ?Attitude/Demeanor/Rapport: Engaged, Gracious ?Affect (typically observed): Accepting, Appropriate, Calm, Hopeful ?Orientation: : Oriented to Self, Oriented to Place, Oriented to  Time, Oriented to Situation ?Alcohol / Substance Use: Not Applicable ?Psych Involvement: No (comment) ? ?Admission diagnosis:  Shock ([place]) [R57.9] ?Alcoholic intoxication with complication ([place]) [R15.400] ?Hypotension, unspecified hypotension type [I95.9] ?Altered mental status, unspecified altered mental status type [R41.82] ?Patient Active Problem List  ? Diagnosis Date Noted  ? Obstructive sleep apnea 10/09/2021  ? Type 2 diabetes mellitus with complication, without long-term current use of insulin ([place]) 10/09/2021  ? Essential hypertension 10/09/2021  ? Hypomagnesemia 10/09/2021  ? Shock ([place]) 10/07/2021  ? Lumbar radiculopathy 10/07/2019  ? Recurrent major depressive disorder ([place]) 01/30/2018  ?  Carpal tunnel syndrome of right wrist 10/21/2017  ? Cervical disc displacement 03/24/2017  ? MRSA (methicillin resistant staph aureus) culture positive 08/08/2016  ? Postlaminectomy syndrome of lumbosacral region 08/08/2016  ?  Sciatica 08/08/2016  ? Spinal stenosis 08/08/2016  ? Slurred speech 01/14/2013  ? Depression 09/03/2012  ? Anxiety 09/03/2012  ? ?PCP:  Leonides Sake, MD ?Pharmacy:   ?[id number], [place] ?[address] ?[place] [address] ?Phone: [phone] Fax: [phone] ? ? ? ? ?Social Determinants of Health (SDOH) Interventions ?  ? ?Readmission Risk Interventions ?No flowsheet data found. ? ? ?

## 2021-10-09 NOTE — Assessment & Plan Note (Addendum)
Blood pressure stable on home medications. ?

## 2021-10-09 NOTE — Assessment & Plan Note (Addendum)
Using CPAP at night

## 2021-10-09 NOTE — Assessment & Plan Note (Addendum)
On metformin at home.  Fairly controlled as per patient.  Holding metformin.  Currently on sliding scale insulin.  Resume metformin on discharge. ?

## 2021-10-09 NOTE — Consult Note (Signed)
[facility] New Face-to-Face Psychiatric Evaluation   Service Date: October 09, 2021 LOS:  LOS: 2 days    Assessment  Rodney Leach is a 61 y.o. male admitted medically for 10/07/2021  2:14 PM for shock. He carries the psychiatric diagnoses of bipolar disorder, alcohol abuse, and tobacco use and has a past medical history of  .Psychiatry was consulted for medication mgmt by Gleason, MD.    His current presentation endorsing racing thoughts, increased energy, bizarre behavior, and insomnia is most consistent with manic-like episode. He meets criteria for Bipolar disorder  based on collateral and history.  Current outpatient psychotropic medications include Prozac 80mg  daily and Lamictal 50mg  daily and historically he has had a poor response to these medications (approx 2 weeks on this regimen). He was  compliant with medications prior to admission as evidenced by patient and spouse indicating compliance. On initial examination, patient appears to endorse symptoms concerning for mania and poor control of his bipolar disorder as he also endorses depressive symptoms over the past few months. Please see plan below for detailed recommendations.   Patient is endorsing symptoms concerning for Manic episode; however due to the [facility] this clouds the picture and makes it difficult to say that patient is manic and not suffering from Substance induced mood disorder. However, patient and patient spouse endorse patient has a hx of episodic manic behavior while sober suggesting that his dx of Bipolar is appropriate. Patient appears to endorse a pattern of flight of ideas and decreased sleep followed by increased Etoh Intake rather than Etoh intake and then change in his behaviors. Patient would benefit from medication mgmt as he appears to be poorly controlled on current regimen; however would wait for at least 24h of medical stabilization before changing medications. Lamictal is not ideal medication as  it does not prevent mania and takes more time to tirate up due to risk of SJS. Patient also is on high dose of SSRI which increases his risk for medication induced manic episodes.  Diagnoses:  Active Hospital problems: Principal Problem:   Shock ([place]) Active Problems:   Depression   Obstructive sleep apnea   Type 2 diabetes mellitus with complication, without long-term current use of insulin ([name])   Essential hypertension   Hypomagnesemia     Plan  ## Safety and Observation Level:  - Based on my clinical evaluation, I estimate the patient to be at mild to moderate risk of self harm in the current setting - At this time, we recommend a routine level of observation. This decision is based on my review of the chart including patient's history and current presentation, interview of the patient, mental status examination, and consideration of suicide risk including evaluating suicidal ideation, plan, intent, suicidal or self-harm behaviors, risk factors, and protective factors. This judgment is based on our ability to directly address suicide risk, implement suicide prevention strategies and develop a safety plan while the patient is in the clinical setting. Please contact our team if there is a concern that risk level has changed.   ## Medications:  -- Continue Prozac 80mg  -- Continue lamictal 25mg , intend to make adjustments and will likley titrate off and change mood stabilizer -- Continue gabapentin 400mg  q8h -- Continue synthroid 128mcg -- Patient currently on Phenobarbital taper  -- Will consider starting naltrexone and a new mood stabilizer or SGA to treat mania  ## Medical Decision Making Capacity:  -- Not formally assessed  ## Further Work-up:  -- Per primary  team   -- most recent EKG on 10/07/2021 had QtC of 489 -- Pertinent labwork reviewed earlier this admission includes: UDS: + BZD, UA- non concerning, LA: 3.4>2.8, Mg- 1.5, Phos- WNL, CMP- Albumin 3.2 LFTs- WNL, A1c-  6.4  ## Disposition:  -- To the floor  ##Legal Status -- Own guardian  Thank you for this consult request. Recommendations have been communicated to the primary team.  We will continue to follow at this time.    PGY-2 Freida Busman, MD   NEW history  Relevant Aspects of Hospital Course:  Admitted on 10/07/2021 for shock.  Patient Report:  Patient is Aox4 on assessment this AM. Patient is able to report what he was told led to his hospitalization but endorses that he is confused as to how he could have ended up in that predicament. Patient reports that he does not recall driving around erratically or wandering the middle of street. Patient reports that he has been feeling likes his mind and thoughts are "racing" over the last month. Patient reports that he has been drinking EtoH to help slow it down. Patient reports that he has been taking his psychiatric medications as prescribed and had a recent change in medications approx 2 weeks ago because he felt he was decompensating. Patient reports that despite taking his medications he is still not sleeping well, feels that he has more energy, drinking more, more irritable, and having racing thoughts.  Patient reports that he likes to drink because it "numbs his mind". Patient denies thoughts that he his being followed/ tracked, denies belief that he is receiving special messages just for him, denies belief in thought insertion or deletion, and denies AVH.    Patient reports that prior to this last month he has been feeling more depressed and worthless. Patient reports that he had to file early retirement because he is "totally disabled" due to back problems and other arthritic changes. Patient reports that he was not able to get disability. Patient reports that he has not adjusted well to this  transition as he has worked for his whole life and is used to feeling more of a provider for the family. Patient endorses passive SI because he feels as  though he is a "burden" to his family."Patient reports that he has has been drinking 1/5 a day for at least the last couple of months, unclear when it started. Patient endorses that his appetite is poor - will go days without eating. Patient endorses binge behavior where he eats out of "depression". Psychiatrist has said they will get them a therapist, has been referred to a group therapy which is unhelpful, per patient. Patient reports that his Anxiety is "very high"; worried about "everything". Worried about the house, the drinking, etc. Patient denies any symptoms concerning for social anxiety (able to go to store/ leave house comfortably with out fear of being judged or changing behaviors due to his fear) and does not endorse recent panic symptoms.      On assessment today patient endorses chronic, passive SI but reports that he would never act as religion is a protective factor. Patient denies HI and AVH. Patient reports his mood is "hopeful" because he got back into the chair. Patient reports that he feels he is "not ready to go" and "just a mess".     Objective:   Initially patient reports that he does not drink at all but started drinking about a month ago later he endorses that he started drinking  about around November.  Collateral information:  Wife reports that patient has been drinking more since 06/2021. Wife confirms that patient did have early retirement around 06/2021 as well. Wife reports that he has a binging behavior with his drinking endorsing that he would go a 1-2 weeks without drinking and then starting up again. Wife reports that patient appears to be unstable recently and his [facility] worsens when he is not stable. Wife reports that over the past week patient could take 2 pints in one day.   Psychiatric History:  Information collected from wife and patient PPH: States he was diagnosed about 25 years ago in his 31s. Patient had inpatient psych hospitalization and endorses having  multiple following hospitalizations. Patient endorses meeting criteria for a true manic episode while sober. He was diagnosed after drinking and "not understanding why" - got sober and was diagnosed with bipolar disorder. He does not remember what behaviors led to initial hospitalization.     Per wife patient has been on numerous medications and she is not able to recall them all. Lithium- toxicity that led to HD Higher dose of Seroquel, but not at a lower dose (dose dependent)- RLS Most successful regimen by memory: Zoloft + mood stabilizer Almost yearly inpatient hospitalization or ED visits for psych med mgmt. Wife reports a pattern of doing well for 1 year on medicine and sudden decompensation.  Family psych history:  His brother and sister have both been diagnosed with bipolar disorder.  Daughter- anxiety and depression  Social History:   Tobacco use: He is working on cutting down on cigarettes but otherwise generally denies substance use. At 0.5ppd Alcohol use: 2 pints/ day Drug use: denies   The patient's family history includes Diabetes in his mother; Hypertension in his father and mother.  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    Surgical History: Past Surgical History:  Procedure Laterality Date   BACK SURGERY     fusion L5/S1   COLONOSCOPY WITH PROPOFOL N/A 09/03/2017   Procedure: COLONOSCOPY WITH PROPOFOL;  Surgeon: Laurence Spates, MD;  Location: [facility];  Service: Endoscopy;  Laterality: N/A;   HAND SURGERY     R & L    HERNIA REPAIR     umbilical   MULTIPLE TOOTH EXTRACTIONS     NOSE  SURGERY     fracture repair   TOTAL KNEE ARTHROPLASTY  2012   Right   TOTAL KNEE ARTHROPLASTY  04/07/2012   Procedure: TOTAL KNEE ARTHROPLASTY;  Surgeon: Ninetta Lights, MD;  Location: [facility];  Service: Orthopedics;  Laterality: Left;  left total knee arthroplasty    Medications:   Current Facility-Administered Medications:    amLODipine (NORVASC) tablet 5 mg, 5 mg, Oral, Daily, Chand, Sudham, MD, 5 mg at 10/09/21 0911   aspirin EC tablet 81 mg, 81 mg, Oral, Daily, Chand, Sudham, MD, 81 mg at 10/09/21 0911   Chlorhexidine Gluconate Cloth 2 % PADS 6 each, 6 each, Topical, Q0600, Samara Deist, MD, 6 each at 10/09/21 0506   docusate sodium (  COLACE) capsule 100 mg, 100 mg, Oral, BID PRN, Samara Deist, MD   famotidine (PEPCID) tablet 40 mg, 40 mg, Oral, Daily, Samara Deist, MD, 40 mg at 10/09/21 0911   FLUoxetine (PROZAC) capsule 80 mg, 80 mg, Oral, Daily, Samara Deist, MD, 80 mg at 97/67/34 1937   folic acid (FOLVITE) tablet 1 mg, 1 mg, Oral, Daily, Samara Deist, MD, 1 mg at 10/09/21 0911   gabapentin (NEURONTIN) capsule 400 mg, 400 mg, Oral, Q8H, Chand, Sudham, MD, 400 mg at 10/09/21 1300   heparin injection 5,000 Units, 5,000 Units, Subcutaneous, Q8H, Samara Deist, MD, 5,000 Units at 10/09/21 1301   insulin aspart (novoLOG) injection 0-5 Units, 0-5 Units, Subcutaneous, QHS, Samara Deist, MD   insulin aspart (novoLOG) injection 0-6 Units, 0-6 Units, Subcutaneous, TID WC, Samara Deist, MD, 1 Units at 10/09/21 9024   lamoTRIgine (LAMICTAL) tablet 25 mg, 25 mg, Oral, Daily, Cinderella, Margaret A, 25 mg at 10/09/21 0911   levothyroxine (SYNTHROID) tablet 175 mcg, 175 mcg, Oral, Q0600, Samara Deist, MD, 175 mcg at 10/09/21 0506   LORazepam (ATIVAN) tablet 1 mg, 1 mg, Oral, Q4H PRN, Barb Merino, MD   losartan (COZAAR) tablet 100 mg, 100 mg, Oral, Daily, Chand, Sudham, MD, 100 mg at 10/09/21 0911   multivitamin with  minerals tablet 1 tablet, 1 tablet, Oral, Daily, Samara Deist, MD, 1 tablet at 10/09/21 0973   oxyCODONE (Oxy IR/ROXICODONE) immediate release tablet 10 mg, 10 mg, Oral, Q6H PRN, Barb Merino, MD, 10 mg at 10/09/21 1301   PHENobarbital (LUMINAL) tablet 64.8 mg, 64.8 mg, Oral, Q8H, 64.8 mg at 10/09/21 1300 **FOLLOWED BY** [START ON 10/10/2021] PHENobarbital (LUMINAL) tablet 32.4 mg, 32.4 mg, Oral, Q8H, Chand, Sudham, MD   polyethylene glycol (MIRALAX / GLYCOLAX) packet 17 g, 17 g, Oral, Daily PRN, Samara Deist, MD, 17 g at 10/09/21 1300   rosuvastatin (CRESTOR) tablet 5 mg, 5 mg, Oral, QHS, Chand, Sudham, MD, 5 mg at 10/08/21 2107   temazepam (RESTORIL) capsule 7.5 mg, 7.5 mg, Oral, QHS PRN, Samara Deist, MD   thiamine tablet 100 mg, 100 mg, Oral, Daily, 100 mg at 10/09/21 0911 **OR** [DISCONTINUED] thiamine (B-1) injection 100 mg, 100 mg, Intravenous, Daily, Samara Deist, MD  Allergies: Allergies  Allergen Reactions   Carbamazepine Hives   Adhesive [Tape] Rash   Sulfa Antibiotics Rash   Sulfamethoxazole Rash       Objective  Vital signs:  Temp:  [97.9 F (36.6 C)-99 F (37.2 C)] 98.9 F (37.2 C) (03/01 1100) Pulse Rate:  [68-87] 75 (03/01 0900) Resp:  [10-16] 15 (03/01 0900) BP: (137-164)/(79-100) 151/96 (03/01 0900) SpO2:  [93 %-97 %] 94 % (03/01 0900)  Psychiatric Specialty Exam:  Presentation  General Appearance: Appropriate for Environment; Casual  Eye Contact:Good  Speech:Clear and Coherent  Speech Volume:Normal  Handedness:No data recorded  Mood and Affect  Mood:-- ("hopeful")  Affect:Constricted   Thought Process  Thought Processes:Goal Directed  Descriptions of Associations:Circumstantial  Orientation:Full (Time, Place and Person)  Thought Content:Logical  History of Schizophrenia/Schizoaffective disorder:No data recorded Duration of Psychotic Symptoms:No data recorded Hallucinations:Hallucinations:  None  Ideas of Reference:None  Suicidal Thoughts:Suicidal Thoughts: Yes, Passive SI Passive Intent and/or Plan: Without Intent; Without Plan  Homicidal Thoughts:Homicidal Thoughts: No   Sensorium  Memory:Immediate Fair; Recent Poor; Remote Fair  Judgment:Fair  Insight:Present   Executive Functions  Concentration:Fair  Attention Span:Fair  Recall:No data recorded Fund of Knowledge:Fair  Language:Good   Psychomotor Activity  Psychomotor Activity:Psychomotor Activity: Normal   Assets  Assets:Communication Skills; Resilience; Desire for Improvement; Social Support; Housing   Sleep  Sleep:Sleep: Poor    Physical Exam: Physical Exam HENT:     Head: Normocephalic.  Pulmonary:     Effort: Pulmonary effort is normal.  Neurological:     Mental Status: He is alert and oriented to person, place, and time.   Review of Systems  Psychiatric/Behavioral:  Positive for depression and suicidal ideas. Negative for hallucinations. The patient has insomnia.   Blood pressure (!) 151/96, pulse 75, temperature 98.9 F (37.2 C), temperature source Oral, resp. rate 15, height 6\' 5"  (1.956 m), weight (!) 154.2 kg, SpO2 94 %. Body mass index is 40.31 kg/m.

## 2021-10-09 NOTE — Assessment & Plan Note (Addendum)
Suspected hypovolemic shock in the setting of polysubstance abuse and polypharmacy including alcohol binges, lactic acidosis from hypovolemia and poor water intake. ?Adequately stabilized.  Off vasopressors and IV fluids.  Lactic acid normalized.  Cultures negative.  Echocardiogram essentially normal.  ?Improved. ?

## 2021-10-09 NOTE — Assessment & Plan Note (Addendum)
With alcoholism.  Also with history of bipolar disorder on multiple medications. ?Followed by psychiatry. Denies any suicidal or homicidal ideations. ?Patient remains on phenobarbital tapering today.  No evidence of alcohol withdrawal so far. ?On multivitamins. ?May benefit with inpatient behavioral therapies, defer to psych team. ?

## 2021-10-09 NOTE — Assessment & Plan Note (Addendum)
Replaced and adequate. ?

## 2021-10-09 NOTE — Hospital Course (Signed)
61 year old gentleman with history of bipolar disorder, alcohol abuse, tobacco use sent to the ER by police after being found in the median of the road with empty alcohol bottle in his car.  Patient apparently had binge drinking and does not remember what happened after that.  On multiple psychotropic medications.  Initially in the emergency room hypotensive and did not respond to 2 L normal saline so required peripheral vasopressors to maintain blood pressures.  UDS positive for benzos, alcohol 272, skeletal survey negative for injury.  Recently changed psychotropic medications.  Also has left foot wound followed by outpatient wound care, patient taking oxycodone as needed. ?

## 2021-10-09 NOTE — Progress Notes (Signed)
?Progress Note ? ? ?Patient: Rodney Leach GBT:[id number] DOB: [birth year] DOA: 10/07/2021     2 ?DOS: the patient was seen and examined on 10/09/2021 ?  ?Brief hospital course: ?61 year old gentleman with history of bipolar disorder, alcohol abuse, tobacco use sent to the ER by police after being found in the median of the road with empty alcohol bottle in his car.  Patient apparently had binge drinking and does not remember what happened after that.  On multiple psychotropic medications.  Initially in the emergency room hypotensive and did not respond to 2 L normal saline so required peripheral vasopressors to maintain blood pressures.  UDS positive for benzos, alcohol 272, skeletal survey negative for injury.  Recently changed psychotropic medications.  Also has left foot wound followed by outpatient wound care, patient taking oxycodone as needed. ? ?Assessment and Plan: ?* Shock ([place])- (present on admission) ?Suspected hypovolemic shock in the setting of polysubstance abuse and polypharmacy including alcohol binges, lactic acidosis from hypovolemia and poor water intake. ?Adequately stabilized.  Off vasopressors and IV fluids.  Lactic acid normalized.  Cultures negative.  Echocardiogram essentially normal.  Keep off antihypertensives today.  Monitor in the hospital.  Allow regular diet and mobilize. ? ?Hypomagnesemia- (present on admission) ?Replace aggressively today.  Recheck levels tomorrow including phosphorus and potassium. ? ?Essential hypertension- (present on admission) ?Blood pressure is picking up.  Hypotensive on arrival.  Resuming amlodipine and tolerating. ? ?Type 2 diabetes mellitus with complication, without long-term current use of insulin ([place])- (present on admission) ?On metformin at home.  Fairly controlled as per patient.  Holding metformin.  Currently on sliding scale insulin. ? ?Obstructive sleep apnea- (present on admission) ?Start using CPAP at night.  Wean off oxygen. ? ?Depression-  (present on admission) ?With alcoholism.  Also with history of bipolar disorder on multiple medications.  Psychiatry will be seeing patient today to help manage medications.  Denies any suicidal or homicidal ideations. ?Patient remains on phenobarbital tapering today.  No evidence of alcohol withdrawal so far. ?On multivitamins. ? ? ? ? ? ? ? ?Subjective: Patient seen and examined in the intensive care unit.  No overnight events.  He does have some back pain.  Not been out of bed yet.  Denies any nausea vomiting.  Blood pressure stable.  He thinks he had trouble drinking but also during binge drinking. ? ?Physical Exam: ?Vitals:  ? 10/09/21 0351 10/09/21 0400 10/09/21 0500 10/09/21 0600  ?BP:  137/83 (!) 148/93 (!) 163/97  ?Pulse:  73 70 71  ?Resp:  15 16 14   ?Temp: 98.5 ?F (36.9 ?C)     ?TempSrc: Oral     ?SpO2:  94% 93% 96%  ?Weight:      ?Height:      ? ?General: Looks fairly comfortable.  Morbidly obese.  On 2 L oxygen.  Not using CPAP.  Able to talk in complete sentences. ?Cardiovascular: S1-S2 normal.  Regular rate rhythm. ?Respiratory: Bilateral clear.  No added sounds. ?Gastrointestinal: Soft.  Pendulous.  Nontender. ?Ext: No cyanosis or edema. ?Neuro: Intact. ?Musculoskeletal: No deformities.  He does have pressure ulcer left foot as above. ?Skin: Intact except the pressure ulcer. ? ? ? ?Data Reviewed: ? ?Results results reviewed with the patient. ? ?Family Communication: None. ? ?Disposition: ?Status is: Inpatient ?Remains inpatient appropriate because: Electrolyte abnormalities.  Still in intensive care unit.  Still in bed. ?Transfer to telemetry bed.  Mobilize with PT OT.  Psychiatry consult today.  Anticipate home tomorrow. ? ? ? ? ? ? ? ? ?  Planned Discharge Destination: Home with Home Health ? ? ? ? ?Time spent: 35 minutes ? ?Author: ?Barb Merino, MD ?10/09/2021 8:03 AM ? ?For on call review [email or web address].  ? ?

## 2021-10-10 ENCOUNTER — Inpatient Hospital Stay (HOSPITAL_COMMUNITY)
Admission: AD | Admit: 2021-10-10 | Payer: BC Managed Care – PPO | Source: Intra-hospital | Admitting: Emergency Medicine

## 2021-10-10 LAB — CBC WITH DIFFERENTIAL/PLATELET
Abs Immature Granulocytes: 0.05 10*3/uL (ref 0.00–0.07)
Basophils Absolute: 0.1 10*3/uL (ref 0.0–0.1)
Basophils Relative: 1 %
Eosinophils Absolute: 0.5 10*3/uL (ref 0.0–0.5)
Eosinophils Relative: 5 %
HCT: 43.2 % (ref 39.0–52.0)
Hemoglobin: 14.2 g/dL (ref 13.0–17.0)
Immature Granulocytes: 1 %
Lymphocytes Relative: 23 %
Lymphs Abs: 2.2 10*3/uL (ref 0.7–4.0)
MCH: 30.5 pg (ref 26.0–34.0)
MCHC: 32.9 g/dL (ref 30.0–36.0)
MCV: 92.7 fL (ref 80.0–100.0)
Monocytes Absolute: 0.7 10*3/uL (ref 0.1–1.0)
Monocytes Relative: 8 %
Neutro Abs: 6 10*3/uL (ref 1.7–7.7)
Neutrophils Relative %: 62 %
Platelets: 238 10*3/uL (ref 150–400)
RBC: 4.66 MIL/uL (ref 4.22–5.81)
RDW: 14.3 % (ref 11.5–15.5)
WBC: 9.5 10*3/uL (ref 4.0–10.5)
nRBC: 0 % (ref 0.0–0.2)

## 2021-10-10 LAB — GLUCOSE, CAPILLARY
Glucose-Capillary: 144 mg/dL — ABNORMAL HIGH (ref 70–99)
Glucose-Capillary: 132 mg/dL — ABNORMAL HIGH (ref 70–99)
Glucose-Capillary: 134 mg/dL — ABNORMAL HIGH (ref 70–99)
Glucose-Capillary: 139 mg/dL — ABNORMAL HIGH (ref 70–99)

## 2021-10-10 LAB — COMPREHENSIVE METABOLIC PANEL
ALT: 25 U/L (ref 0–44)
AST: 21 U/L (ref 15–41)
Albumin: 3.1 g/dL — ABNORMAL LOW (ref 3.5–5.0)
Alkaline Phosphatase: 69 U/L (ref 38–126)
Anion gap: 6 (ref 5–15)
BUN: 8 mg/dL (ref 8–23)
CO2: 28 mmol/L (ref 22–32)
Calcium: 8.6 mg/dL — ABNORMAL LOW (ref 8.9–10.3)
Chloride: 98 mmol/L (ref 98–111)
Creatinine, Ser: 0.91 mg/dL (ref 0.61–1.24)
GFR, Estimated: >60 mL/min (ref >60)
Glucose, Bld: 109 mg/dL — ABNORMAL HIGH (ref 70–99)
Potassium: 4.1 mmol/L (ref 3.5–5.1)
Sodium: 132 mmol/L — ABNORMAL LOW (ref 135–145)
Total Bilirubin: 0.6 mg/dL (ref 0.3–1.2)
Total Protein: 6.4 g/dL — ABNORMAL LOW (ref 6.5–8.1)

## 2021-10-10 LAB — MAGNESIUM: Magnesium: 1.8 mg/dL (ref 1.7–2.4)

## 2021-10-10 LAB — RESP PANEL BY RT-PCR (FLU A&B, COVID) ARPGX2
Influenza A by PCR: NEGATIVE
Influenza B by PCR: NEGATIVE
SARS Coronavirus 2 by RT PCR: POSITIVE — AB

## 2021-10-10 LAB — PHOSPHORUS: Phosphorus: 2.4 mg/dL — ABNORMAL LOW (ref 2.5–4.6)

## 2021-10-10 MED ORDER — THIAMINE HCL 100 MG PO TABS: 100.0000 mg | ORAL_TABLET | Freq: Every day | ORAL | Status: DC

## 2021-10-10 MED ORDER — DIVALPROEX SODIUM 500 MG PO DR TAB
500.0000 mg | DELAYED_RELEASE_TABLET | Freq: Two times a day (BID) | ORAL | Status: DC
  Filled 2021-10-10 (×3): qty 1

## 2021-10-10 MED ORDER — IBUPROFEN 200 MG PO TABS
800.0000 mg | ORAL_TABLET | Freq: Four times a day (QID) | ORAL | Status: DC | PRN
  Administered 2021-10-10: 800 mg via ORAL
  Filled 2021-10-10: qty 4

## 2021-10-10 MED ORDER — IBUPROFEN 800 MG PO TABS: 800.0000 mg | ORAL_TABLET | Freq: Three times a day (TID) | ORAL | 0 refills | Status: DC | PRN

## 2021-10-10 MED ORDER — DIVALPROEX SODIUM 500 MG PO DR TAB
500.0000 mg | DELAYED_RELEASE_TABLET | Freq: Two times a day (BID) | ORAL | Status: DC
  Filled 2021-10-10 (×2): qty 1

## 2021-10-10 MED ORDER — CHLORHEXIDINE GLUCONATE CLOTH 2 % EX PADS
6.0000 | MEDICATED_PAD | Freq: Every day | CUTANEOUS | Status: DC
  Administered 2021-10-11: 6 via TOPICAL

## 2021-10-10 MED ORDER — FLUOXETINE HCL 20 MG PO CAPS: 60.0000 mg | ORAL_CAPSULE | Freq: Every day | ORAL | 3 refills | Status: DC

## 2021-10-10 MED ORDER — FOLIC ACID 1 MG PO TABS: 1.0000 mg | ORAL_TABLET | Freq: Every day | ORAL | Status: DC

## 2021-10-10 MED ORDER — FLUOXETINE HCL 20 MG PO CAPS
60.0000 mg | ORAL_CAPSULE | Freq: Every day | ORAL | Status: DC
  Administered 2021-10-11: 60 mg via ORAL
  Filled 2021-10-10: qty 3

## 2021-10-10 MED ORDER — GABAPENTIN 400 MG PO CAPS: 400.0000 mg | ORAL_CAPSULE | Freq: Three times a day (TID) | ORAL | Status: DC

## 2021-10-10 MED ORDER — PHENOBARBITAL 32.4 MG PO TABS
32.4000 mg | ORAL_TABLET | Freq: Three times a day (TID) | ORAL | 0 refills | Status: DC
Start: 2021-10-10 — End: 2021-10-11

## 2021-10-10 MED ORDER — DIVALPROEX SODIUM 500 MG PO DR TAB: 500.0000 mg | DELAYED_RELEASE_TABLET | Freq: Two times a day (BID) | ORAL | Status: DC

## 2021-10-10 NOTE — TOC Progression Note (Signed)
Transition of Care (TOC) - Initial/Assessment Note  ? ? ?Patient Details  ?Name: Rodney Leach ?MRN: [id number] ?Date of Birth: 12-Nov-1960 ? ?Transition of Care (TOC) CM/SW Contact:    ?Paulene Floor Leafy Motsinger, LCSWA ?Phone Number: ?10/10/2021, 4:41 PM ? ?Clinical Narrative:                 ?CSW received consult for SA and MH.  CSW attempted to meet with the patient, but RN was in with the patient.  The patient is also COVID positive.  CSW gave the floor RN a list of resources for SA and outpatient psychiatric clinics and ask that this information be given to the patient.   ? ?  ?Barriers to Discharge: Continued Medical Work up ? ? ?Patient Goals and CMS Choice ?Patient states their goals for this hospitalization and ongoing recovery are:: To return home ?CMS Medicare.gov Compare Post Acute Care list provided to:: Patient ?  ? ?Expected Discharge Plan and Services ?  ?  ?Discharge Planning Services: CM Consult ?  ?Living arrangements for the past 2 months: [place] ?Expected Discharge Date: 10/10/21               ?  ?  ?  ?  ?  ?  ?  ?  ?  ?  ? ?Prior Living Arrangements/Services ?Living arrangements for the past 2 months: [place] ?Lives with:: Spouse ?Patient language and need for interpreter reviewed:: Yes ?Do you feel safe going back to the place where you live?: Yes      ?Need for Family Participation in Patient Care: Yes (Comment) ?Care giver support system in place?: Yes (comment) ?  ?Criminal Activity/Legal Involvement Pertinent to Current Situation/Hospitalization: No - Comment as needed ? ?Activities of Daily Living ?Home Assistive Devices/Equipment: None ?ADL Screening (condition at time of admission) ?Patient's cognitive ability adequate to safely complete daily activities?: Yes ?Is the patient deaf or have difficulty hearing?: No ?Does the patient have difficulty seeing, even when wearing glasses/contacts?: No ?Does the patient have difficulty concentrating, remembering, or making decisions?:  No ?Patient able to express need for assistance with ADLs?: Yes ?Does the patient have difficulty dressing or bathing?: No ?Independently performs ADLs?: Yes (appropriate for developmental age) ?Does the patient have difficulty walking or climbing stairs?: No ?Weakness of Legs: None ?Weakness of Arms/Hands: None ? ?Permission Sought/Granted ?Permission sought to share information with : Case Manager, Customer service manager, Family Supports ?Permission granted to share information with : Yes, Verbal Permission Granted ?   ?   ?   ?   ? ?Emotional Assessment ?Appearance:: Appears stated age ?Attitude/Demeanor/Rapport: Engaged, Gracious ?Affect (typically observed): Accepting, Appropriate, Calm, Hopeful ?Orientation: : Oriented to Self, Oriented to Place, Oriented to  Time, Oriented to Situation ?Alcohol / Substance Use: Not Applicable ?Psych Involvement: No (comment) ? ?Admission diagnosis:  Shock ([place]) [R57.9] ?Alcoholic intoxication with complication ([place]) [B09.628] ?Hypotension, unspecified hypotension type [I95.9] ?Altered mental status, unspecified altered mental status type [R41.82] ?Patient Active Problem List  ? Diagnosis Date Noted  ? Obstructive sleep apnea 10/09/2021  ? Type 2 diabetes mellitus with complication, without long-term current use of insulin ([place]) 10/09/2021  ? Essential hypertension 10/09/2021  ? Hypomagnesemia 10/09/2021  ? Shock ([place]) 10/07/2021  ? Lumbar radiculopathy 10/07/2019  ? Recurrent major depressive disorder ([place]) 01/30/2018  ? Carpal tunnel syndrome of right wrist 10/21/2017  ? Cervical disc displacement 03/24/2017  ? MRSA (methicillin resistant staph aureus) culture positive 08/08/2016  ? Postlaminectomy syndrome of  lumbosacral region 08/08/2016  ? Sciatica 08/08/2016  ? Spinal stenosis 08/08/2016  ? Slurred speech 01/14/2013  ? Depression 09/03/2012  ? Anxiety 09/03/2012  ? ?PCP:  Leonides Sake, MD ?Pharmacy:   ?[id number] [address], [place] ?[address] ?[place] [address] ?Phone: [phone] Fax: [phone] ? ? ? ? ?Social Determinants of Health (SDOH) Interventions ?  ? ?Readmission Risk Interventions ?No flowsheet data found. ? ? ?

## 2021-10-10 NOTE — Consult Note (Addendum)
[facility] Followup Face-to-Face Psychiatric Evaluation   Service Date: October 10, 2021 LOS:  LOS: 3 days    Assessment  Rodney Leach is a 61 y.o. male admitted medically for 10/07/2021  2:14 PM for shock. He carries the psychiatric diagnoses of bipolar disorder, alcohol abuse, and tobacco use and has a past medical history of  .Psychiatry was consulted for medication mgmt by Gleason, MD.      His current presentation endorsing racing thoughts, increased energy, bizarre behavior, and insomnia is most consistent with manic-like episode. He meets criteria for Bipolar disorder  based on collateral and history.  Current outpatient psychotropic medications include Prozac 80mg  daily and Lamictal 50mg  daily and historically he has had a poor response to these medications (approx 2 weeks on this regimen). He was  compliant with medications prior to admission as evidenced by patient and spouse indicating compliance. On initial examination, patient appears to endorse symptoms concerning for mania and poor control of his bipolar disorder as he also endorses depressive symptoms over the past few months. Please see plan below for detailed recommendations.   Patient continues to endorse symptoms concerning for mania as he is still not sleeping well, having racing thoughts, and feeling very irritable and down. Patient would benefit from stabilization to decrease risk of repeat increased EtoH attempt in self-reported attempt to self-medicate.    Diagnoses:  Active Hospital problems: Principal Problem:   Shock ([place]) Active Problems:   Depression   Obstructive sleep apnea   Type 2 diabetes mellitus with complication, without long-term current use of insulin ([name])   Essential hypertension   Hypomagnesemia     Plan  ## Safety and Observation Level:  - Based on my clinical evaluation, I estimate the patient to be at mild to moderate risk of self harm in the current setting - At this  time, we recommend a routine level of observation. This decision is based on my review of the chart including patient's history and current presentation, interview of the patient, mental status examination, and consideration of suicide risk including evaluating suicidal ideation, plan, intent, suicidal or self-harm behaviors, risk factors, and protective factors. This judgment is based on our ability to directly address suicide risk, implement suicide prevention strategies and develop a safety plan while the patient is in the clinical setting. Please contact our team if there is a concern that risk level has changed.     ## Medications:  -- Decrease home Prozac 80mg  to Prozac 60mg  -- Discontinue lamictal 25mg  -- Start Depakote 500mg  BID, intend to titrate up as tolerated,no more than 7 days supply at d'c -- Continue gabapentin 400mg  q8h -- Continue synthroid 143mcg -- Patient currently on Phenobarbital taper   -- Will consider starting naltrexone and a new mood stabilizer or SGA to treat mania   ## Medical Decision Making Capacity:  -- Not formally assessed   ## Further Work-up:  -- Per primary team     -- most recent EKG on 10/07/2021 had QtC of 489 -- Pertinent labwork reviewed earlier this admission includes: UDS: + BZD, UA- non concerning, LA: 3.4>2.8, Mg- 1.5, Phos- WNL, CMP- Albumin 3.2 LFTs- WNL, A1c- 6.4   ## Disposition:  --Home, patient was accepted to [facility] however he resulted Covid + and was the denied   ##Legal Status -- Own guardian   Thank you for this consult request. Recommendations have been communicated to the primary team.  We will continue to follow at this time.  PGY-2 Freida Busman, MD    followup history  Relevant Aspects of Hospital Course:  Admitted on 10/07/2021 for shock.  Patient Report:  Patient reports that he recall being on both depakote and risperdal at some point in the past. Patient reports that he does not wish to start risperdal  "something bad happened with that, I don't remember, But I know I don't like it." Patient reports that he still has frequent night time awakenings and his appetite is still poor. Patient reports that he continues to have racing thoughts. Patient reports that his mood is sad and irritable. Patient denies thoughts of cravings or urges to drink. Patient reports that he continues to have passive SI, but denies HI and AVH. Patient reports that he is feeling jittery inside and endorses that the faint tremor noted on exam may be due to this as he does not have tremor at baseline.    RN reports that she has not had any issues with patient, despite him endorsing he is irritable she reports that he has been kind to her. Provider has also had nothing but pleasant conversations with patient.  Collateral information:  Wife reports that patient has been drinking more since 06/2021. Wife confirms that patient did have early retirement around 06/2021 as well. Wife reports that he has a binging behavior with his drinking endorsing that he would go a 1-2 weeks without drinking and then starting up again. Wife reports that patient appears to be unstable recently and his [facility] worsens when he is not stable. Wife reports that over the past week patient could take 2 pints in one day.    Psychiatric History:  Information collected from wife and patient PPH: States he was diagnosed about 25 years ago in his 69s. Patient had inpatient psych hospitalization and endorses having multiple following hospitalizations. Patient endorses meeting criteria for a true manic episode while sober. He was diagnosed after drinking and "not understanding why" - got sober and was diagnosed with bipolar disorder. He does not remember what behaviors led to initial hospitalization.     Per wife patient has been on numerous medications and she is not able to recall them all. Lithium- toxicity that led to HD Higher dose of Seroquel, but not at a  lower dose (dose dependent)- RLS Most successful regimen by memory: Zoloft + mood stabilizer Almost yearly inpatient hospitalization or ED visits for psych med mgmt. Wife reports a pattern of doing well for 1 year on medicine and sudden decompensation.   Family psych history:  His brother and sister have both been diagnosed with bipolar disorder.  Daughter- anxiety and depression   Social History:    Tobacco use: He is working on cutting down on cigarettes but otherwise generally denies substance use. At 0.5ppd Alcohol use: 2 pints/ day Drug use: denies     The patient's family history includes Diabetes in his mother; Hypertension in his father and mother.   The patient's family history includes Diabetes in his mother; Hypertension in his father and mother.  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    Surgical History: Past Surgical History:  Procedure Laterality Date   BACK SURGERY     fusion L5/S1   COLONOSCOPY WITH PROPOFOL N/A 09/03/2017   Procedure: COLONOSCOPY WITH PROPOFOL;  Surgeon: Laurence Spates, MD;  Location: [facility] ENDOSCOPY;  Service: Endoscopy;  Laterality: N/A;   HAND SURGERY     R & L    HERNIA REPAIR     umbilical   MULTIPLE TOOTH EXTRACTIONS     NOSE SURGERY     fracture repair   TOTAL KNEE ARTHROPLASTY  2012   Right   TOTAL KNEE ARTHROPLASTY  04/07/2012   Procedure: TOTAL KNEE ARTHROPLASTY;  Surgeon: Ninetta Lights, MD;  Location: [facility];  Service: Orthopedics;  Laterality: Left;  left total knee arthroplasty    Medications:   Current Facility-Administered Medications:    amLODipine  (NORVASC) tablet 5 mg, 5 mg, Oral, Daily, Chand, Sudham, MD, 5 mg at 10/10/21 0800   aspirin EC tablet 81 mg, 81 mg, Oral, Daily, Chand, Sudham, MD, 81 mg at 10/10/21 0802   [START ON 10/11/2021] Chlorhexidine Gluconate Cloth 2 % PADS 6 each, 6 each, Topical, Daily, Barb Merino, MD   [START ON 10/11/2021] divalproex (DEPAKOTE) DR tablet 500 mg, 500 mg, Oral, BID, Marzell Isakson B, MD   docusate sodium (COLACE) capsule 100 mg, 100 mg, Oral, BID PRN, Samara Deist, MD, 100 mg at 10/10/21 0945   famotidine (PEPCID) tablet 40 mg, 40 mg, Oral, Daily, Samara Deist, MD, 40 mg at 10/10/21 0801   [START ON 10/11/2021] FLUoxetine (PROZAC) capsule 60 mg, 60 mg, Oral, Daily, Kimber Esterly B, MD   folic acid (FOLVITE) tablet 1 mg, 1 mg, Oral, Daily, Samara Deist, MD, 1 mg at 10/10/21 0801   gabapentin (NEURONTIN) capsule 400 mg, 400 mg, Oral, Q8H, Chand, Sudham, MD, 400 mg at 10/10/21 0653   heparin injection 5,000 Units, 5,000 Units, Subcutaneous, Q8H, Samara Deist, MD, 5,000 Units at 10/10/21 1610   ibuprofen (ADVIL) tablet 800 mg, 800 mg, Oral, Q6H PRN, Barb Merino, MD, 800 mg at 10/10/21 1111   insulin aspart (novoLOG) injection 0-5 Units, 0-5 Units, Subcutaneous, QHS, Samara Deist, MD   insulin aspart (novoLOG) injection 0-6 Units, 0-6 Units, Subcutaneous, TID WC, Samara Deist, MD, 1 Units at 10/09/21 9604   levothyroxine (SYNTHROID) tablet 175 mcg, 175 mcg, Oral, Q0600, Samara Deist, MD, 175 mcg at 10/10/21 0653   LORazepam (ATIVAN) tablet 1 mg, 1 mg, Oral, Q4H PRN, Barb Merino, MD   losartan (COZAAR) tablet 100 mg, 100 mg, Oral, Daily, Chand, Sudham, MD, 100 mg at 10/10/21 0946   multivitamin with minerals tablet 1 tablet, 1 tablet, Oral, Daily, Samara Deist, MD, 1 tablet at 10/10/21 0800   oxyCODONE (Oxy IR/ROXICODONE) immediate release tablet 10 mg, 10 mg, Oral, Q6H PRN, Barb Merino, MD, 10 mg at 10/09/21 1945   PHENobarbital  (LUMINAL) tablet 64.8 mg, 64.8 mg, Oral, Q8H, 64.8 mg at 10/09/21 1300 **FOLLOWED BY** PHENobarbital (LUMINAL) tablet 32.4 mg, 32.4 mg, Oral, Q8H, Chand, Sudham, MD, 32.4 mg at 10/10/21 0654   polyethylene glycol (MIRALAX / GLYCOLAX) packet 17 g, 17 g, Oral, Daily PRN, Samara Deist, MD, 17 g at 10/10/21 0945   rosuvastatin (CRESTOR) tablet 5 mg, 5 mg, Oral, QHS, Chand, Sudham, MD, 5 mg at 10/08/21 2107   temazepam (RESTORIL) capsule 7.5 mg, 7.5 mg, Oral, QHS PRN, Samara Deist, MD   thiamine  tablet 100 mg, 100 mg, Oral, Daily, 100 mg at 10/10/21 0800 **OR** [DISCONTINUED] thiamine (B-1) injection 100 mg, 100 mg, Intravenous, Daily, Samara Deist, MD  Allergies: Allergies  Allergen Reactions   Carbamazepine Hives   Adhesive [Tape] Rash   Sulfa Antibiotics Rash   Sulfamethoxazole Rash       Objective  Vital signs:  Temp:  [98 F (36.7 C)-98.3 F (36.8 C)] 98.3 F (36.8 C) (03/02 1209) Pulse Rate:  [66-80] 70 (03/02 1300) Resp:  [9-21] 14 (03/02 1300) BP: (141-175)/(79-112) 153/79 (03/02 1300) SpO2:  [92 %-97 %] 95 % (03/02 1300)  Psychiatric Specialty Exam:  Presentation  General Appearance: Appropriate for Environment; Casual  Eye Contact:Good  Speech:Clear and Coherent  Speech Volume:Normal  Handedness:No data recorded  Mood and Affect  Mood:-- ("hopeful")  Affect:Constricted   Thought Process  Thought Processes:Goal Directed  Descriptions of Associations:Circumstantial  Orientation:Full (Time, Place and Person)  Thought Content:Logical  History of Schizophrenia/Schizoaffective disorder:No data recorded Duration of Psychotic Symptoms:No data recorded Hallucinations:Hallucinations: None  Ideas of Reference:None  Suicidal Thoughts:Suicidal Thoughts: Yes, Passive SI Passive Intent and/or Plan: Without Intent; Without Plan  Homicidal Thoughts:Homicidal Thoughts: No   Sensorium  Memory:Immediate Fair; Recent Poor; Remote  Fair  Judgment:Fair  Insight:Present   Executive Functions  Concentration:Fair  Attention Span:Fair  Recall:No data recorded Fund of Knowledge:Fair  Language:Good   Psychomotor Activity  Psychomotor Activity:Psychomotor Activity: Normal   Assets  Assets:Communication Skills; Resilience; Desire for Improvement; Social Support; Housing   Sleep  Sleep:Sleep: Poor    Physical Exam: Physical Exam Constitutional:      Appearance: Normal appearance.  HENT:     Head: Normocephalic and atraumatic.  Pulmonary:     Effort: Pulmonary effort is normal.  Neurological:     Mental Status: He is alert and oriented to person, place, and time.   Review of Systems  Psychiatric/Behavioral:  Positive for suicidal ideas. Negative for hallucinations. The patient has insomnia.   Blood pressure (!) 153/79, pulse 70, temperature 98.3 F (36.8 C), temperature source Oral, resp. rate 14, height 6\' 5"  (1.956 m), weight (!) 154.2 kg, SpO2 95 %. Body mass index is 40.31 kg/m.
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
## 2021-10-10 NOTE — Progress Notes (Signed)
Per Dr. Ellan Lambert request, the Infection prevention RN was contacted re: patient covid diagnosis. Geralyn Corwin, RN IP indicated patient should remain on isolation x 10 days. ? ?

## 2021-10-10 NOTE — Discharge Summary (Signed)
Physician Discharge Summary   Patient: Rodney Leach MRN: [id number] DOB: Feb 23, 1961  Admit date:     10/07/2021  Discharge date: 10/10/21  Discharge Physician: Barb Merino   PCP: Leonides Sake, MD   Recommendations at discharge:   As per psychiatric provider.   Discharge Diagnoses: Principal Problem:   Shock ([place]) Active Problems:   Depression   Obstructive sleep apnea   Type 2 diabetes mellitus with complication, without long-term current use of insulin ([name])   Essential hypertension   Hypomagnesemia  Resolved Problems:   * No resolved hospital problems. *   Hospital Course: 61 year old gentleman with history of bipolar disorder, alcohol abuse, tobacco use sent to the ER by police after being found in the median of the road with empty alcohol bottle in his car.  Patient apparently had binge drinking and does not remember what happened after that.  On multiple psychotropic medications.  Initially in the emergency room hypotensive and did not respond to 2 L normal saline so required peripheral vasopressors to maintain blood pressures.  UDS positive for benzos, alcohol 272, skeletal survey negative for injury.  Recently changed psychotropic medications.  Also has left foot wound followed by outpatient wound care, patient taking oxycodone as needed.  Assessment and Plan: * Shock ([place]) Suspected hypovolemic shock in the setting of polysubstance abuse and polypharmacy including alcohol binges, lactic acidosis from hypovolemia and poor water intake. Adequately stabilized.  Off vasopressors and IV fluids.  Lactic acid normalized.  Cultures negative.  Echocardiogram essentially normal.  Improved.  Depression With alcoholism.  Also with history of bipolar disorder on multiple medications. Followed by psychiatry. Denies any suicidal or homicidal ideations. Patient remains on phenobarbital tapering today.  No evidence of alcohol withdrawal so far. On multivitamins. May benefit  with inpatient behavioral therapies, defer to psych team.  Hypomagnesemia Replaced and adequate.  Essential hypertension Blood pressure stable on home medications.  Type 2 diabetes mellitus with complication, without long-term current use of insulin ([place]) On metformin at home.  Fairly controlled as per patient.  Holding metformin.  Currently on sliding scale insulin.  Resume metformin on discharge.  Obstructive sleep apnea Using CPAP at night.   Patient is medically stable.  Can transfer to inpatient behavioral unit to continue to manage his medications, titrate and achieve good symptom control.        Consultants: PCCM, psychiatry Procedures performed: None Disposition:  Inpatient psych Diet recommendation:  Discharge Diet Orders (From admission, onward)     Start     Ordered   10/10/21 0000  Diet - low sodium heart healthy        10/10/21 1144   10/10/21 0000  Diet Carb Modified        10/10/21 1144           Cardiac and Carb modified diet  DISCHARGE MEDICATION: Allergies as of 10/10/2021       Reactions   Carbamazepine Hives   Adhesive [tape] Rash   Sulfa Antibiotics Rash   Sulfamethoxazole Rash        Medication List     STOP taking these medications    amoxicillin-clavulanate 875-125 MG tablet Commonly known as: AUGMENTIN   buPROPion 150 MG 24 hr tablet Commonly known as: WELLBUTRIN XL   cyclobenzaprine 10 MG tablet Commonly known as: FLEXERIL   gabapentin 800 MG tablet Commonly known as: NEURONTIN Replaced by: gabapentin 400 MG capsule   HYDROcodone-acetaminophen 5-325 MG tablet Commonly known as: NORCO/VICODIN   lamoTRIgine 25  MG tablet Commonly known as: LAMICTAL   lurasidone 80 MG Tabs tablet Commonly known as: LATUDA   OLANZapine 5 MG tablet Commonly known as: ZYPREXA   QUEtiapine 300 MG 24 hr tablet Commonly known as: SEROQUEL XR   sertraline 100 MG tablet Commonly known as: ZOLOFT   traZODone 100 MG tablet Commonly  known as: DESYREL   Vitamin D (Ergocalciferol) 1.25 MG (50000 UNIT) Caps capsule Commonly known as: DRISDOL   zolpidem 5 MG tablet Commonly known as: AMBIEN       TAKE these medications    amLODipine 5 MG tablet Commonly known as: NORVASC Take 5 mg by mouth daily.   aspirin 81 MG EC tablet Take 81 mg by mouth daily.   b complex vitamins tablet Take 1 tablet by mouth daily.   Breo Ellipta 100-25 MCG/ACT Aepb Generic drug: fluticasone furoate-vilanterol Inhale 1 puff into the lungs daily.   divalproex 500 MG DR tablet Commonly known as: DEPAKOTE Take 1 tablet (500 mg total) by mouth 2 (two) times daily.   FLUoxetine 20 MG capsule Commonly known as: PROZAC Take 3 capsules (60 mg total) by mouth daily. Start taking on: October 11, 2021 What changed:  medication strength how much to take   fluticasone 50 MCG/ACT nasal spray Commonly known as: FLONASE Place 2 sprays into both nostrils 3 (three) times daily as needed for allergies.   folic acid 1 MG tablet Commonly known as: FOLVITE Take 1 tablet (1 mg total) by mouth daily. Start taking on: October 11, 2021   gabapentin 400 MG capsule Commonly known as: NEURONTIN Take 1 capsule (400 mg total) by mouth every 8 (eight) hours. Replaces: gabapentin 800 MG tablet   ibuprofen 800 MG tablet Commonly known as: ADVIL Take 1 tablet (800 mg total) by mouth every 8 (eight) hours as needed for fever, headache, moderate pain or mild pain.   levothyroxine 175 MCG tablet Commonly known as: SYNTHROID Take 1 tablet (175 mcg total) by mouth daily.   losartan 100 MG tablet Commonly known as: COZAAR Take 100 mg by mouth daily. What changed: Another medication with the same name was removed. Continue taking this medication, and follow the directions you see here.   metFORMIN 1000 MG tablet Commonly known as: GLUCOPHAGE Take 1,000 mg by mouth 2 (two) times daily.   methocarbamol 750 MG tablet Commonly known as: ROBAXIN Take 750  mg by mouth every 6 (six) hours as needed.   Movantik 25 MG Tabs tablet Generic drug: naloxegol oxalate Take 25 mg by mouth daily.   Oxycodone HCl 10 MG Tabs Take 10 mg by mouth every 4 (four) hours as needed (pain).   PHENobarbital 32.4 MG tablet Commonly known as: LUMINAL Take 1 tablet (32.4 mg total) by mouth every 8 (eight) hours for 5 doses.   rosuvastatin 5 MG tablet Commonly known as: CRESTOR Take 5 mg by mouth at bedtime.   temazepam 7.5 MG capsule Commonly known as: RESTORIL Take 7.5 mg by mouth at bedtime as needed for sleep.   thiamine 100 MG tablet Take 1 tablet (100 mg total) by mouth daily. Start taking on: October 11, 2021               Discharge Care Instructions  (From admission, onward)           Start     Ordered   10/10/21 0000  Discharge wound care:       Comments: Tuck piece of Aquacel Kellie Simmering # [phone]) into left  foot wound Q day, using swab to fill, then cover with foam dressing.  (Change foam dressing Q 3 days or PRN soiling.)  Moisten previous dressing each time with NS to remove.   10/10/21 1144             Discharge Exam: Filed Weights   10/07/21 1426 10/08/21 0113  Weight: (!) 150.1 kg (!) 154.2 kg   General: Slightly anxious.  On room air.  Sitting in couch.   Cardiovascular: S1-S2 normal.  Regular rate rhythm. Respiratory: Bilateral clear.  No added sounds. Gastrointestinal: Soft.  Nontender.  Bowel sounds present.  Obese and pendulous. Ext: No cyanosis or edema.  No deformities. Neuro: Intact. Psych: Anxious Unstageable pressure ulcer left foot.   Condition at discharge: good  The results of significant diagnostics from this hospitalization (including imaging, microbiology, ancillary and laboratory) are listed below for reference.   Imaging Studies: CT Head Wo Contrast  Result Date: 10/07/2021 CLINICAL DATA:  Altered level of consciousness, intoxicated, hypotension EXAM: CT HEAD WITHOUT CONTRAST TECHNIQUE:  Contiguous axial images were obtained from the base of the skull through the vertex without intravenous contrast. RADIATION DOSE REDUCTION: This exam was performed according to the departmental dose-optimization program which includes automated exposure control, adjustment of the mA and/or kV according to patient size and/or use of iterative reconstruction technique. COMPARISON:  10/03/2021 FINDINGS: Brain: Stable hypodensities within the bilateral basal ganglia and periventricular white matter consistent with chronic small vessel ischemic change. No evidence of acute infarct or hemorrhage. Lateral ventricles and midline structures are unremarkable. No acute extra-axial fluid collections. No mass effect. Vascular: No hyperdense vessel or unexpected calcification. Skull: Normal. Negative for fracture or focal lesion. Sinuses/Orbits: Stable postsurgical changes from ethmoidectomies and bilateral medial wall antrectomies. Stable mucosal thickening throughout the maxillary, ethmoid, and frontal sinuses. Other: None. IMPRESSION: 1. Stable head CT, no acute intracranial process. Electronically Signed   By: Randa Ngo M.D.   On: 10/07/2021 18:35   CT Chest W Contrast  Result Date: 10/07/2021 CLINICAL DATA:  Hypoxia, shortness of breath. EXAM: CT CHEST WITH CONTRAST TECHNIQUE: Multidetector CT imaging of the chest was performed during intravenous contrast administration. RADIATION DOSE REDUCTION: This exam was performed according to the departmental dose-optimization program which includes automated exposure control, adjustment of the mA and/or kV according to patient size and/or use of iterative reconstruction technique. CONTRAST:  89mL OMNIPAQUE IOHEXOL 350 MG/ML SOLN COMPARISON:  September 01, 2012. FINDINGS: Cardiovascular: No significant vascular findings. Normal heart size. No pericardial effusion. Mediastinum/Nodes: No enlarged mediastinal, hilar, or axillary lymph nodes. Thyroid gland, trachea, and esophagus  demonstrate no significant findings. Lungs/Pleura: No pneumothorax or pleural effusion is noted. Minimal bibasilar subsegmental atelectasis is noted. Upper Abdomen: No acute abnormality. Musculoskeletal: No chest wall abnormality. No acute or significant osseous findings. IMPRESSION: Minimal bibasilar subsegmental atelectasis or scarring. No other significant abnormality seen in the chest. Electronically Signed   By: Marijo Conception M.D.   On: 10/07/2021 18:41   MR FOOT LEFT WO CONTRAST  Result Date: 10/07/2021 CLINICAL DATA:  Foot swelling, diabetic, osteomyelitis suspected, xray done EXAM: MRI OF THE LEFT FOOT WITHOUT CONTRAST TECHNIQUE: Multiplanar, multisequence MR imaging of the left foot was performed. No intravenous contrast was administered. COMPARISON:  Left radiograph 09/24/2021 FINDINGS: Bones/Joint/Cartilage There is no significant marrow signal abnormality. There is no acute fracture or dislocation. Ligaments Intact Lisfranc ligament.  Intact MTP collateral ligaments. Muscles and Tendons Diffuse intramuscular edema muscle atrophy in the foot as is commonly seen in  diabetics. There is no acute tendon tear in the forefoot. Soft tissues There is a plantar soft tissue ulcer at the plantar aspect of the second toe proximal phalanx. There is adjacent soft tissue swelling without evidence of a well-defined/drainable fluid collection. Diffuse soft tissue swelling of the foot. IMPRESSION: Plantar soft tissue ulcer at the level of the second toe proximal phalanx. Adjacent soft tissue swelling without evidence of soft tissue abscess. No evidence of osteomyelitis. Electronically Signed   By: Maurine Simmering M.D.   On: 10/07/2021 08:42   DG Chest Portable 1 View  Result Date: 10/07/2021 CLINICAL DATA:  Driving or adequately, hypotension, found in car stuck in the median, history diabetes mellitus, hypertension, stroke, asthma EXAM: PORTABLE CHEST 1 VIEW COMPARISON:  Portable exam 1453 hours compared to  10/03/2021 FINDINGS: Slightly prominent cardiac silhouette with pulmonary vascular congestion. Mediastinal contours normal. Interstitial infiltrates in the mid to lower lungs bilaterally, question pulmonary edema versus multifocal infection. No pleural effusion or pneumothorax. Prior spinal fixation. IMPRESSION: Prominent cardiac silhouette with pulmonary vascular congestion and diffuse BILATERAL pulmonary infiltrates of the mid to lower lungs, question pulmonary edema versus multifocal infection. Electronically Signed   By: Lavonia Dana M.D.   On: 10/07/2021 15:12   DG Foot Complete Left  Result Date: 09/24/2021 Please see detailed radiograph report in office note.  ECHOCARDIOGRAM COMPLETE  Result Date: 10/08/2021    ECHOCARDIOGRAM REPORT   Patient Name:   Rodney Leach Date of Exam: 10/08/2021 Medical Rec #:  [id number]       Height:       77.0 in Accession #:    [id number]      Weight:       339.9 lb Date of Birth:  [birth year]      BSA:          2.802 m Patient Age:    46 years        BP:           157/88 mmHg Patient Gender: M               HR:           78 bpm. Exam Location:  Inpatient Procedure: 2D Echo, Cardiac Doppler, Color Doppler and Intracardiac            Opacification Agent Indications:    HYPOTENSION SHOCK  History:        Patient has no prior history of Echocardiogram examinations.                 Stroke; Risk Factors:Hypertension and Diabetes.  Sonographer:    Beryle Beams Referring Phys: [id number] ELISE L STEPHENSON  Sonographer Comments: VERY POOR ACOUSTIC WINDOWS W DEFINITY IMPRESSIONS  1. Left ventricular ejection fraction, by estimation, is 60 to 65%. The left ventricle has normal function. Left ventricular endocardial border not optimally defined to evaluate regional wall motion. This is improved with use of echo-contrast by the LV base is not well visualized; no wall motion abnormalities seen in views obtained. Left ventricular diastolic parameters are indeterminate.  2. Right  ventricular systolic function is normal. The right ventricular size is normal. Tricuspid regurgitation signal is inadequate for assessing PA pressure.  3. The mitral valve was not well visualized. No evidence of mitral valve regurgitation. No evidence of mitral stenosis.  4. The aortic valve was not well visualized. Aortic valve regurgitation is not visualized. No aortic stenosis is present. Comparison(s): No prior Echocardiogram. FINDINGS  Left Ventricle: Left ventricular ejection  fraction, by estimation, is 60 to 65%. The left ventricle has normal function. Left ventricular endocardial border not optimally defined to evaluate regional wall motion. The left ventricular internal cavity size was normal in size. Suboptimal image quality limits for assessment of left ventricular hypertrophy. Left ventricular diastolic parameters are indeterminate. Right Ventricle: The right ventricular size is normal. Right vetricular wall thickness was not well visualized. Right ventricular systolic function is normal. Tricuspid regurgitation signal is inadequate for assessing PA pressure. Left Atrium: Left atrial size was normal in size. Right Atrium: Right atrial size was normal in size. Pericardium: Trivial pericardial effusion is present. Mitral Valve: The mitral valve was not well visualized. No evidence of mitral valve regurgitation. No evidence of mitral valve stenosis. Tricuspid Valve: The tricuspid valve is not well visualized. Tricuspid valve regurgitation is not demonstrated. No evidence of tricuspid stenosis. Aortic Valve: The aortic valve was not well visualized. Aortic valve regurgitation is not visualized. No aortic stenosis is present. Aortic valve mean gradient measures 4.0 mmHg. Aortic valve peak gradient measures 7.1 mmHg. Aortic valve area, by VTI measures 4.44 cm. Pulmonic Valve: The pulmonic valve was not well visualized. Pulmonic valve regurgitation is not visualized. No evidence of pulmonic stenosis. Aorta:  The aortic root and ascending aorta are structurally normal, with no evidence of dilitation. Venous: The inferior vena cava was not well visualized. IAS/Shunts: No atrial level shunt detected by color flow Doppler.  LEFT VENTRICLE PLAX 2D LVIDd:         5.50 cm      Diastology LVIDs:         3.10 cm      LV e' medial:    9.03 cm/s LV PW:         1.30 cm      LV E/e' medial:  12.0 LV IVS:        1.10 cm      LV e' lateral:   11.40 cm/s LVOT diam:     2.80 cm      LV E/e' lateral: 9.5 LV SV:         104 LV SV Index:   37 LVOT Area:     6.16 cm  LV Volumes (MOD) LV vol d, MOD A2C: 154.0 ml LV vol d, MOD A4C: 129.0 ml LV vol s, MOD A2C: 112.0 ml LV vol s, MOD A4C: 76.9 ml LV SV MOD A2C:     42.0 ml LV SV MOD A4C:     129.0 ml LV SV MOD BP:      45.2 ml RIGHT VENTRICLE             IVC RV S prime:     13.90 cm/s  IVC diam: 3.20 cm TAPSE (M-mode): 2.6 cm LEFT ATRIUM             Index        RIGHT ATRIUM           Index LA diam:        4.80 cm 1.71 cm/m   RA Area:     17.90 cm LA Vol (A2C):   81.8 ml 29.19 ml/m  RA Volume:   51.80 ml  18.48 ml/m LA Vol (A4C):   52.0 ml 18.55 ml/m LA Biplane Vol: 67.6 ml 24.12 ml/m  AORTIC VALVE                    PULMONIC VALVE AV Area (Vmax):    3.85 cm  PV Vmax:       0.68 m/s AV Area (Vmean):   3.68 cm     PV Vmean:      46.000 cm/s AV Area (VTI):     4.44 cm     PV VTI:        0.126 m AV Vmax:           133.50 cm/s  PV Peak grad:  1.9 mmHg AV Vmean:          91.600 cm/s  PV Mean grad:  1.0 mmHg AV VTI:            0.234 m AV Peak Grad:      7.1 mmHg AV Mean Grad:      4.0 mmHg LVOT Vmax:         83.40 cm/s LVOT Vmean:        54.800 cm/s LVOT VTI:          0.169 m LVOT/AV VTI ratio: 0.72  AORTA Ao Root diam: 3.20 cm Ao Asc diam:  3.30 cm MITRAL VALVE MV Area (PHT): 3.85 cm     SHUNTS MV Decel Time: 197 msec     Systemic VTI:  0.17 m MV E velocity: 108.00 cm/s  Systemic Diam: 2.80 cm MV A velocity: 72.35 cm/s MV E/A ratio:  1.49 Rudean Haskell MD Electronically signed  by Rudean Haskell MD Signature Date/Time: 10/08/2021/12:59:46 PM    Final     Microbiology: Results for orders placed or performed during the hospital encounter of 10/07/21  Blood culture (routine x 2)     Status: None (Preliminary result)   Collection Time: 10/07/21  3:35 PM   Specimen: BLOOD RIGHT ARM  Result Value Ref Range Status   Specimen Description BLOOD RIGHT ARM  Final   Special Requests   Final    BOTTLES DRAWN AEROBIC AND ANAEROBIC Blood Culture adequate volume   Culture   Final    NO GROWTH 2 DAYS Performed at [facility], [address], [place], [place] [address]    Report Status PENDING  Incomplete  Blood culture (routine x 2)     Status: None (Preliminary result)   Collection Time: 10/07/21  3:50 PM   Specimen: BLOOD RIGHT HAND  Result Value Ref Range Status   Specimen Description BLOOD RIGHT HAND  Final   Special Requests   Final    BOTTLES DRAWN AEROBIC AND ANAEROBIC Blood Culture adequate volume   Culture   Final    NO GROWTH 2 DAYS Performed at [facility], [place] [address], [place], [place] [address]    Report Status PENDING  Incomplete  Resp Panel by RT-PCR (Flu A&B, Covid) Nasopharyngeal Swab     Status: None   Collection Time: 10/07/21 10:05 PM   Specimen: Nasopharyngeal Swab; Nasopharyngeal(NP) swabs in vial transport medium  Result Value Ref Range Status   SARS Coronavirus 2 by RT PCR NEGATIVE NEGATIVE Final    Comment: (NOTE) SARS-CoV-2 target nucleic acids are NOT DETECTED.  The SARS-CoV-2 RNA is generally detectable in upper respiratory specimens during the acute phase of infection. The lowest concentration of SARS-CoV-2 viral copies this assay can detect is 138 copies/mL. A negative result does not preclude SARS-Cov-2 infection and should not be used as the sole basis for treatment or other patient management decisions. A negative result may occur with  improper specimen collection/handling, submission of specimen other than  nasopharyngeal swab, presence of viral mutation(s) within the areas targeted  by this assay, and inadequate number of viral copies(<138 copies/mL). A negative result must be combined with clinical observations, patient history, and epidemiological information. The expected result is Negative.  Fact Sheet for Patients:  EntrepreneurPulse.com.au  Fact Sheet for Healthcare Providers:  IncredibleEmployment.be  This test is no t yet approved or cleared by the [place] FDA and  has been authorized for detection and/or diagnosis of SARS-CoV-2 by FDA under an Emergency Use Authorization (EUA). This EUA will remain  in effect (meaning this test can be used) for the duration of the COVID-19 declaration under Section 564(b)(1) of the Act, 21 U.S.C.section 360bbb-3(b)(1), unless the authorization is terminated  or revoked sooner.       Influenza A by PCR NEGATIVE NEGATIVE Final   Influenza B by PCR NEGATIVE NEGATIVE Final    Comment: (NOTE) The Xpert Xpress SARS-CoV-2/FLU/RSV plus assay is intended as an aid in the diagnosis of influenza from Nasopharyngeal swab specimens and should not be used as a sole basis for treatment. Nasal washings and aspirates are unacceptable for Xpert Xpress SARS-CoV-2/FLU/RSV testing.  Fact Sheet for Patients: EntrepreneurPulse.com.au  Fact Sheet for Healthcare Providers: IncredibleEmployment.be  This test is not yet approved or cleared by the [place] FDA and has been authorized for detection and/or diagnosis of SARS-CoV-2 by FDA under an Emergency Use Authorization (EUA). This EUA will remain in effect (meaning this test can be used) for the duration of the COVID-19 declaration under Section 564(b)(1) of the Act, 21 U.S.C. section 360bbb-3(b)(1), unless the authorization is terminated or revoked.  Performed at [facility], [place] [address] [place], [place] [address]    MRSA Next Gen by PCR, Nasal     Status: None   Collection Time: 10/08/21  1:13 AM   Specimen: Nasal Mucosa; Nasal Swab  Result Value Ref Range Status   MRSA by PCR Next Gen NOT DETECTED NOT DETECTED Final    Comment: (NOTE) The GeneXpert MRSA Assay (FDA approved for NASAL specimens only), is one component of a comprehensive MRSA colonization surveillance program. It is not intended to diagnose MRSA infection nor to guide or monitor treatment for MRSA infections. Test performance is not FDA approved in patients less than 9 years old. Performed at [facility], [place] [address], [place], [place] [address]     Labs: CBC: Recent Labs  Lab 10/07/21 1450 10/07/21 1604 10/08/21 0213 10/10/21 0220  WBC 10.4  --  6.5 9.5  NEUTROABS 5.7  --  3.7 6.0  HGB 14.1 14.3 14.5 14.2  HCT 43.2 42.0 43.2 43.2  MCV 93.7  --  92.3 92.7  PLT 292  --  259 174   Basic Metabolic Panel: Recent Labs  Lab 10/07/21 1450 10/07/21 1604 10/08/21 0213 10/10/21 0220  NA 135 136 138 132*  K 4.0 3.8 4.0 4.1  CL 101  --  104 98  CO2 22  --  23 28  GLUCOSE 88  --  124* 109*  BUN 9  --  7* 8  CREATININE 0.93  --  0.83 0.91  CALCIUM 8.6*  --  8.5* 8.6*  MG  --   --  1.5* 1.8  PHOS  --   --  2.6 2.4*   Liver Function Tests: Recent Labs  Lab 10/07/21 1450 10/08/21 0213 10/10/21 0220  AST 25 27 21   ALT 24 28 25   ALKPHOS 68 72 69  BILITOT 0.8 0.5 0.6  PROT 6.3* 6.4* 6.4*  ALBUMIN 3.3* 3.2* 3.1*  CBG: Recent Labs  Lab 10/09/21 0846 10/09/21 1144 10/09/21 1628 10/09/21 2142 10/10/21 0752  GLUCAP 183* 123* 121* 121* 144*    Discharge time spent: greater than 30 minutes.  Signed: Barb Merino, MD [facility] 10/10/2021

## 2021-10-10 NOTE — Progress Notes (Signed)
?Progress Note ? ? ?Patient: Rodney Leach WLN:[id number] DOB: 1961/01/23 DOA: 10/07/2021     3 ?DOS: the patient was seen and examined on 10/10/2021 ?  ?Brief hospital course: ?61 year old gentleman with history of bipolar disorder, alcohol abuse, tobacco use sent to the ER by police after being found in the median of the road with empty alcohol bottle in his car.  Patient apparently had binge drinking and does not remember what happened after that.  On multiple psychotropic medications.  Initially in the emergency room hypotensive and did not respond to 2 L normal saline so required peripheral vasopressors to maintain blood pressures.  UDS positive for benzos, alcohol 272, skeletal survey negative for injury.  Recently changed psychotropic medications.  Also has left foot wound followed by outpatient wound care, patient taking oxycodone as needed. ? ?Assessment and Plan: ?* Shock ([place])- (present on admission) ?Suspected hypovolemic shock in the setting of polysubstance abuse and polypharmacy including alcohol binges, lactic acidosis from hypovolemia and poor water intake. ?Adequately stabilized.  Off vasopressors and IV fluids.  Lactic acid normalized.  Cultures negative.  Echocardiogram essentially normal.  ?Improved. ? ?Hypomagnesemia- (present on admission) ?Replaced and adequate. ? ?Essential hypertension- (present on admission) ?Blood pressure stable on home medications. ? ?Type 2 diabetes mellitus with complication, without long-term current use of insulin ([place])- (present on admission) ?On metformin at home.  Fairly controlled as per patient.  Holding metformin.  Currently on sliding scale insulin.  Resume metformin on discharge. ? ?Obstructive sleep apnea- (present on admission) ?Using CPAP at night. ? ?Depression- (present on admission) ?With alcoholism.  Also with history of bipolar disorder on multiple medications. ?Followed by psychiatry. Denies any suicidal or homicidal ideations. ?Patient remains on  phenobarbital tapering today.  No evidence of alcohol withdrawal so far. ?On multivitamins. ?May benefit with inpatient behavioral therapies, defer to psych team. ? ? ?Patient clinically stabilizing.  Can transfer to [facility] bed.  Can transfer to inpatient psych/behavior therapies if deemed necessary by psychiatric team. ? ? ? ? ?Subjective: Patient seen and examined.  He was able to walk around in the nursing station.  He was able to use CPAP last night.  Complains of headache and some racing thoughts in the head but denies any delusions or hallucinations.  Denies any suicidal ideations or harmful thoughts. ?Called and discussed with patient's wife, she was anticipating his medication to be adjusted at inpatient behavioral therapies. ? ?Physical Exam: ?Vitals:  ? 10/10/21 0754 10/10/21 0800 10/10/21 0900 10/10/21 1000  ?BP:  (!) 175/92 (!) 159/88 (!) 156/89  ?Pulse: 78 78 76 74  ?Resp: 15 15 14  (!) 21  ?Temp: 98 ?F (36.7 ?C)     ?TempSrc: Oral     ?SpO2: 95% 97% 96% 95%  ?Weight:      ?Height:      ? ?General: Slightly anxious.  On room air.  Sitting in couch.   ?Cardiovascular: S1-S2 normal.  Regular rate rhythm. ?Respiratory: Bilateral clear.  No added sounds. ?Gastrointestinal: Soft.  Nontender.  Bowel sounds present.  Obese and pendulous. ?Ext: No cyanosis or edema.  No deformities. ?Neuro: Intact. ?Psych: Anxious. ? ? ?Data Reviewed: ? ?Reviewed with patient and family. ? ?Family Communication: Wife on the phone ? ?Disposition: ?Status is: Inpatient ?Remains inpatient appropriate because: May need psychiatric placement, medication adjustment inpatient. ? ? ? ? ? ? ? ? ? Planned Discharge Destination:  Inpatient behavioral therapies ? ? ? ? ?Time spent: 35 minutes ? ?Author: ?Barb Merino, MD ?10/10/2021  10:51 AM ? ?For on call review [email or web address].  ? ?

## 2021-10-11 DIAGNOSIS — F3181 Bipolar II disorder: Secondary | ICD-10-CM

## 2021-10-11 LAB — COMPREHENSIVE METABOLIC PANEL
ALT: 35 U/L (ref 0–44)
AST: 33 U/L (ref 15–41)
Albumin: 3.2 g/dL — ABNORMAL LOW (ref 3.5–5.0)
Alkaline Phosphatase: 74 U/L (ref 38–126)
Anion gap: 9 (ref 5–15)
BUN: 7 mg/dL — ABNORMAL LOW (ref 8–23)
CO2: 28 mmol/L (ref 22–32)
Calcium: 8.8 mg/dL — ABNORMAL LOW (ref 8.9–10.3)
Chloride: 96 mmol/L — ABNORMAL LOW (ref 98–111)
Creatinine, Ser: 0.9 mg/dL (ref 0.61–1.24)
GFR, Estimated: >60 mL/min (ref >60)
Glucose, Bld: 124 mg/dL — ABNORMAL HIGH (ref 70–99)
Potassium: 4.2 mmol/L (ref 3.5–5.1)
Sodium: 133 mmol/L — ABNORMAL LOW (ref 135–145)
Total Bilirubin: 0.6 mg/dL (ref 0.3–1.2)
Total Protein: 6.3 g/dL — ABNORMAL LOW (ref 6.5–8.1)

## 2021-10-11 LAB — GLUCOSE, CAPILLARY: Glucose-Capillary: 142 mg/dL — ABNORMAL HIGH (ref 70–99)

## 2021-10-11 MED ORDER — FLUOXETINE HCL 20 MG PO CAPS: 60.0000 mg | ORAL_CAPSULE | Freq: Every day | ORAL | 0 refills | Status: DC

## 2021-10-11 MED ORDER — GABAPENTIN 400 MG PO CAPS: 400.0000 mg | ORAL_CAPSULE | Freq: Three times a day (TID) | ORAL | 0 refills | Status: DC

## 2021-10-11 MED ORDER — DIVALPROEX SODIUM 500 MG PO DR TAB: 500.0000 mg | DELAYED_RELEASE_TABLET | Freq: Two times a day (BID) | ORAL | 0 refills | Status: DC

## 2021-10-11 MED ORDER — IBUPROFEN 800 MG PO TABS: 800.0000 mg | ORAL_TABLET | Freq: Three times a day (TID) | ORAL | 0 refills | Status: DC | PRN

## 2021-10-11 NOTE — Discharge Instructions (Signed)
Emergent Psychiatry Outpatient Care ? ?Please come to [facility]) first floor is the Urgent Care. Please walk to the desk and sign in.  ? ?Address:  ?[address] in [place], [place] ?Ph: [phone]   ? ? ?Or  ? ?[facility]in ? ?[address]  ?[place], [place] ?[address] ? ? ?Changes to Psychiatry Medications ?Stop Lamictal (lamotrigine) ?Start Depakote 500mg  BID ?Stop Prozac (fluoxetine) 80mg  ?Start Prozac (fluoxetine) 60mg  ?Continue gabapentin (Neurontin) ?

## 2021-10-11 NOTE — Progress Notes (Signed)
Discharge instructions (including medications) discussed with and copy provided to patient.  IV's removed with no bleeding at sites.  Patient taken to lobby at this time via wc for wife to transport home.  ?

## 2021-10-11 NOTE — Progress Notes (Signed)
Rodney Leach   Service Date: October 11, 2021 LOS:  LOS: 4 days    Assessment  Rodney Leach is a 61 y.o. Leach admitted medically for 10/07/2021  2:14 PM for shock. He carries the psychiatric diagnoses of bipolar disorder, alcohol abuse, and tobacco use and has a past medical history of  .Psychiatry was consulted for medication mgmt by Gleason, MD.      His current presentation endorsing racing thoughts, increased energy, bizarre behavior, and insomnia is most consistent with manic-like episode. He meets criteria for Bipolar disorder  based on collateral and history.  Current outpatient psychotropic medications include Prozac 80mg  daily and Lamictal 50mg  daily and historically he has had a poor response to these medications (approx 2 weeks on this regimen). He was  compliant with medications prior to admission as evidenced by patient and spouse indicating compliance. On initial examination, patient appears to endorse symptoms concerning for mania and poor control of his bipolar disorder as he also endorses depressive symptoms over the past few months. Please see plan below for detailed recommendations.    Patient endorses more physical symptoms of Covid today. Patient remained ok with medication adjustments as he felt that they would treat his racing thoughts, irritability, and poor sleep. Patient did not appear to be a safety concerns as he has denied SI for the last 48h and throughout his hospitalization he has endorsed his faith as a protective factor. Patient does not meet criteria for IVC and is recommended to complete his quarantine at home so long as he continues to medically stable.  Diagnoses:  Active Hospital problems: Principal Problem:   Shock ([place]) Active Problems:   Depression   Obstructive sleep apnea   Type 2 diabetes mellitus with complication, without long-term current use of insulin ([name])   Essential hypertension    Hypomagnesemia   Bipolar II disorder ([place])     Plan  ## Safety and Observation Level:  - Based on my clinical Leach, I estimate the patient to be at mild to moderate risk of self harm in the current setting - At this time, we recommend a routine level of observation. This decision is based on my review of the chart including patient's history and current presentation, interview of the patient, mental status examination, and consideration of suicide risk including evaluating suicidal ideation, plan, intent, suicidal or self-harm behaviors, risk factors, and protective factors. This judgment is based on our ability to directly address suicide risk, implement suicide prevention strategies and develop a safety plan while the patient is in the clinical setting. Please contact our team if there is a concern that risk level has changed.     ## Medications:  -- Continue Prozac 60mg  -- Discontinue lamictal 25mg  -- Start Depakote 500mg  BID, intend to titrate up as tolerated,no more than 7 days supply at d'c -- Continue gabapentin 400mg  q8h -- Continue synthroid 161mcg   ## Medical Decision Making Capacity:  -- Not formally assessed   ## Further Work-up:  -- Per primary team     -- most recent EKG on 10/07/2021 had QtC of 489 -- Pertinent labwork reviewed earlier this admission includes: UDS: + BZD, UA- non concerning, LA: 3.4>2.8, Mg- 1.5, Phos- WNL, CMP- Albumin 3.2 LFTs- WNL, A1c- 6.4   ## Disposition:  --Home, patient was accepted to [facility] however he resulted Covid + and was the denied   ##Legal Status -- Own guardian   Thank you for this consult request.  Recommendations have been communicated to the primary team.  We sign off at this time.   PGY-2 Freida Busman, MD   followup history  Relevant Aspects of Hospital Course:  Admitted on 10/07/2021 for shock.  Patient Report:  Patient endorsed understanding as to why he would not be able to go to inpatient pscyh facility. Patient  reported that he was starting to feel more physically ill and agreed that going home was best. Patient was educated on community resources available when he finishes quarantine and the medication changes that had been made during hospitalization. Patient denied SI,HI, and AVH on assessment today and confirmed that there were no firearms in the home.  Collateral information:  3/3 Patient's wife reaffirmed that she was aware of medication changes and was also educated on community resources should patient decompensate.     Psychiatric History:  Information collected from wife and patient PPH: States he was diagnosed about 25 years ago in his 42s. Patient had inpatient psych hospitalization and endorses having multiple following hospitalizations. Patient endorses meeting criteria for a true manic episode while sober. He was diagnosed after drinking and "not understanding why" - got sober and was diagnosed with bipolar disorder. He does not remember what behaviors led to initial hospitalization.     Per wife patient has been on numerous medications and she is not able to recall them all. Lithium- toxicity that led to HD Higher dose of Seroquel, but not at a lower dose (dose dependent)- RLS Most successful regimen by memory: Zoloft + mood stabilizer Almost yearly inpatient hospitalization or ED visits for psych med mgmt. Wife reports a pattern of doing well for 1 year on medicine and sudden decompensation.   Family psych history:  His brother and sister have both been diagnosed with bipolar disorder.  Daughter- anxiety and depression   Social History:    Tobacco use: He is working on cutting down on cigarettes but otherwise generally denies substance use. At 0.5ppd Alcohol use: 2 pints/ day Drug use: denies  Family History:   The patient's family history includes Diabetes in his mother; Hypertension in his father and mother.  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    Surgical History: Past Surgical History:  Procedure Laterality Date   BACK SURGERY     fusion L5/S1   COLONOSCOPY WITH PROPOFOL N/A 09/03/2017   Procedure: COLONOSCOPY WITH PROPOFOL;  Surgeon: Laurence Spates, MD;  Location: [facility];  Service: Endoscopy;  Laterality: N/A;   HAND SURGERY     R & L    HERNIA REPAIR     umbilical   MULTIPLE TOOTH EXTRACTIONS     NOSE SURGERY     fracture repair   TOTAL KNEE ARTHROPLASTY  2012   Right   TOTAL KNEE ARTHROPLASTY  04/07/2012   Procedure: TOTAL KNEE ARTHROPLASTY;  Surgeon: Ninetta Lights, MD;  Location: [facility];  Service: Orthopedics;  Laterality: Left;  left total knee arthroplasty    Medications:   Current Facility-Administered Medications:    amLODipine (NORVASC)  tablet 5 mg, 5 mg, Oral, Daily, Chand, Sudham, MD, 5 mg at 10/11/21 1003   aspirin EC tablet 81 mg, 81 mg, Oral, Daily, Chand, Sudham, MD, 81 mg at 10/11/21 1003   Chlorhexidine Gluconate Cloth 2 % PADS 6 each, 6 each, Topical, Daily, Ghimire, Kuber, MD, 6 each at 10/11/21 1004   divalproex (DEPAKOTE) DR tablet 500 mg, 500 mg, Oral, BID, Jerzy Roepke B, MD   docusate sodium (COLACE) capsule 100 mg, 100 mg, Oral, BID PRN, Samara Deist, MD, 100 mg at 10/10/21 0945   famotidine (PEPCID) tablet 40 mg, 40 mg, Oral, Daily, Samara Deist, MD, 40 mg at 10/11/21 1003   FLUoxetine (PROZAC) capsule 60 mg, 60 mg, Oral, Daily, Sharday Michl B, MD, 60 mg at 69/67/89 3810   folic acid (FOLVITE) tablet 1 mg, 1 mg, Oral, Daily, Samara Deist, MD, 1 mg at 10/11/21 1003   gabapentin (NEURONTIN) capsule 400 mg, 400 mg, Oral, Q8H, Chand,  Sudham, MD, 400 mg at 10/11/21 0549   heparin injection 5,000 Units, 5,000 Units, Subcutaneous, Q8H, Samara Deist, MD, 5,000 Units at 10/11/21 0548   ibuprofen (ADVIL) tablet 800 mg, 800 mg, Oral, Q6H PRN, Barb Merino, MD, 800 mg at 10/10/21 1111   insulin aspart (novoLOG) injection 0-5 Units, 0-5 Units, Subcutaneous, QHS, Samara Deist, MD   insulin aspart (novoLOG) injection 0-6 Units, 0-6 Units, Subcutaneous, TID WC, Samara Deist, MD, 1 Units at 10/09/21 1751   levothyroxine (SYNTHROID) tablet 175 mcg, 175 mcg, Oral, Q0600, Samara Deist, MD, 175 mcg at 10/11/21 0549   LORazepam (ATIVAN) tablet 1 mg, 1 mg, Oral, Q4H PRN, Barb Merino, MD   losartan (COZAAR) tablet 100 mg, 100 mg, Oral, Daily, Chand, Currie [place], MD, 100 mg at 10/10/21 0946   multivitamin with minerals tablet 1 tablet, 1 tablet, Oral, Daily, Samara Deist, MD, 1 tablet at 10/11/21 1003   oxyCODONE (Oxy IR/ROXICODONE) immediate release tablet 10 mg, 10 mg, Oral, Q6H PRN, Barb Merino, MD, 10 mg at 10/11/21 1003   [EXPIRED] PHENobarbital (LUMINAL) tablet 64.8 mg, 64.8 mg, Oral, Q8H, 64.8 mg at 10/09/21 1300 **FOLLOWED BY** PHENobarbital (LUMINAL) tablet 32.4 mg, 32.4 mg, Oral, Q8H, Chand, Sudham, MD, 32.4 mg at 10/11/21 0548   polyethylene glycol (MIRALAX / GLYCOLAX) packet 17 g, 17 g, Oral, Daily PRN, Samara Deist, MD, 17 g at 10/10/21 0945   rosuvastatin (CRESTOR) tablet 5 mg, 5 mg, Oral, QHS, Chand, Sudham, MD, 5 mg at 10/10/21 2224   temazepam (RESTORIL) capsule 7.5 mg, 7.5 mg, Oral, QHS PRN, Samara Deist, MD   thiamine tablet 100 mg, 100 mg, Oral, Daily, 100 mg at 10/11/21 1003 **OR** [DISCONTINUED] thiamine (B-1) injection 100 mg, 100 mg, Intravenous, Daily, Samara Deist, MD  Current Outpatient Medications:    amLODipine (NORVASC) 5 MG tablet, Take 5 mg by mouth daily., Disp: , Rfl:    aspirin 81 MG EC tablet, Take 81 mg by mouth daily., Disp: , Rfl:    b complex  vitamins tablet, Take 1 tablet by mouth daily., Disp: , Rfl:    BREO ELLIPTA 100-25 MCG/INH AEPB, Inhale 1 puff into the lungs daily., Disp: , Rfl: 3   fluticasone (FLONASE) 50 MCG/ACT nasal spray, Place 2 sprays into both nostrils 3 (three) times daily as needed for allergies., Disp: , Rfl: 5   levothyroxine (SYNTHROID, LEVOTHROID) 175 MCG tablet, Take 1 tablet (175 mcg total) by mouth daily., Disp: 30 tablet, Rfl: 0   losartan (  COZAAR) 100 MG tablet, Take 100 mg by mouth daily., Disp: , Rfl:    metFORMIN (GLUCOPHAGE) 1000 MG tablet, Take 1,000 mg by mouth 2 (two) times daily., Disp: , Rfl:    methocarbamol (ROBAXIN) 750 MG tablet, Take 750 mg by mouth every 6 (six) hours as needed., Disp: , Rfl:    MOVANTIK 25 MG TABS tablet, Take 25 mg by mouth daily., Disp: , Rfl: 0   Oxycodone HCl 10 MG TABS, Take 10 mg by mouth every 4 (four) hours as needed (pain)., Disp: , Rfl:    rosuvastatin (CRESTOR) 5 MG tablet, Take 5 mg by mouth at bedtime., Disp: , Rfl:    temazepam (RESTORIL) 7.5 MG capsule, Take 7.5 mg by mouth at bedtime as needed for sleep., Disp: , Rfl:    divalproex (DEPAKOTE) 500 MG DR tablet, Take 1 tablet (500 mg total) by mouth 2 (two) times daily for 7 days., Disp: 14 tablet, Rfl: 0   FLUoxetine (PROZAC) 20 MG capsule, Take 3 capsules (60 mg total) by mouth daily., Disp: 90 capsule, Rfl: 0   folic acid (FOLVITE) 1 MG tablet, Take 1 tablet (1 mg total) by mouth daily., Disp: , Rfl:    gabapentin (NEURONTIN) 400 MG capsule, Take 1 capsule (400 mg total) by mouth every 8 (eight) hours., Disp: 90 capsule, Rfl: 0   ibuprofen (ADVIL) 800 MG tablet, Take 1 tablet (800 mg total) by mouth every 8 (eight) hours as needed for fever, headache, moderate pain or mild pain., Disp: 30 tablet, Rfl: 0   thiamine 100 MG tablet, Take 1 tablet (100 mg total) by mouth daily., Disp: , Rfl:   Allergies: Allergies  Allergen Reactions   Carbamazepine Hives   Adhesive [Tape] Rash   Sulfa Antibiotics Rash    Sulfamethoxazole Rash       Objective  Vital signs:  Temp:  [97.6 F (36.4 C)-98.4 F (36.9 C)] 98.3 F (36.8 C) (03/03 0812) Pulse Rate:  [63-77] 63 (03/03 0130) Resp:  [9-16] 11 (03/03 0130) BP: (125-173)/(64-94) 141/91 (03/03 0130) SpO2:  [93 %-95 %] 94 % (03/03 0130)  Psychiatric Specialty Exam:  Presentation  General Appearance: Appropriate for Environment; Casual  Eye Contact:Good  Speech:Clear and Coherent  Speech Volume:Normal  Handedness:No data recorded  Mood and Affect  Mood:Hopeless; Irritable  Affect:Appropriate   Thought Process  Thought Processes:Goal Directed  Descriptions of Associations:Circumstantial  Orientation:Full (Time, Place and Person)  Thought Content:Logical  History of Schizophrenia/Schizoaffective disorder:No data recorded Duration of Psychotic Symptoms:No data recorded Hallucinations:Hallucinations: None  Ideas of Reference:None  Suicidal Thoughts:Suicidal Thoughts: Yes, Passive SI Passive Intent and/or Plan: Without Intent  Homicidal Thoughts:Homicidal Thoughts: No   Sensorium  Memory:Immediate Fair; Recent Fair  Judgment:Fair  Insight:Shallow   Executive Functions  Concentration:Fair  Attention Span:Fair  Recall:No data recorded Fund of Knowledge:Good  Language:Good   Psychomotor Activity  Psychomotor Activity:Psychomotor Activity: Increased; Tremor   Assets  Assets:Communication Skills; Resilience; Desire for Improvement; Housing; Social Support   Sleep  Sleep:Sleep: Poor    Physical Exam: Physical Exam HENT:     Head: Normocephalic and atraumatic.  Pulmonary:     Effort: Pulmonary effort is normal.  Neurological:     Mental Status: He is alert and oriented to person, place, and time.   Review of Systems  Respiratory:  Positive for cough.   Musculoskeletal:  Positive for myalgias.  Psychiatric/Behavioral:  Negative for hallucinations and suicidal ideas.   Blood pressure (!) 141/91,  pulse 63, temperature 98.3 F (36.8 C),  temperature source Oral, resp. rate 11, height 6\' 5"  (1.956 m), weight (!) 154.2 kg, SpO2 94 %. Body mass index is 40.31 kg/m.

## 2021-10-11 NOTE — Discharge Summary (Signed)
Physician Discharge Summary   Patient: Rodney Leach MRN: [id number] DOB: 1961-07-20  Admit date:     10/07/2021  Discharge date: 10/11/21  Discharge Physician: Barb Merino   PCP: Leonides Sake, MD   Recommendations at discharge:  Follow-up with outpatient psychiatric provider. Isolation precautions for 7 days.  Over-the-counter cough medications and Tylenol.   Discharge Diagnoses: Principal Problem:   Shock ([place]) Active Problems:   Depression   Obstructive sleep apnea   Type 2 diabetes mellitus with complication, without long-term current use of insulin ([name])   Essential hypertension   Hypomagnesemia   Bipolar II disorder ([place])  Resolved Problems:   * No resolved hospital problems. *   Hospital Course: 61 year old gentleman with history of bipolar disorder, alcohol abuse, tobacco use sent to the ER by police after being found in the median of the road with empty alcohol bottle in his car.  Patient apparently had binge drinking and does not remember what happened after that.  On multiple psychotropic medications.  Initially in the emergency room hypotensive and did not respond to 2 L normal saline so required peripheral vasopressors to maintain blood pressures.  UDS positive for benzos, alcohol 272, skeletal survey negative for injury.  Recently changed psychotropic medications.  Also has left foot wound followed by outpatient wound care, patient taking oxycodone as needed.  Assessment and Plan: Shock ([place]) Suspected hypovolemic shock in the setting of polysubstance abuse and polypharmacy including alcohol binges, lactic acidosis from hypovolemia and poor water intake. Adequately stabilized.  Off vasopressors and IV fluids.  Lactic acid normalized.  Cultures negative.  Echocardiogram essentially normal.  Improved.  Sepsis ruled out.  Blood pressures adequate and currently on antihypertensives.  Depression With alcoholism.  Also with history of bipolar disorder on  multiple medications. Followed by psychiatry. Denies any suicidal or homicidal ideations. Completed phenobarbital taper. No evidence of alcohol withdrawal so far. On multivitamins. Initially plan for inpatient behavioral health admission, however his screening test was positive for COVID-19 and unable to go to inpatient therapies so going home with outpatient follow-up. Changes in medications were done by psychiatric team, Lamictal was stopped and patient was started on Depakote. Dose of Prozac was changed from 80 mg to 60 mg.  He will continue gabapentin and other long-term medications including thyroxine replacement. Patient without any evidence of lethality, suicidal or homicidal ideation.  Psychiatry has reevaluated patient and cleared for discharge home.  Hypomagnesemia Replaced and adequate.  Essential hypertension Blood pressure stable on home medications.  Resume today.  Type 2 diabetes mellitus with complication, without long-term current use of insulin ([place]) On metformin at home.  Resume today.  Obstructive sleep apnea Using CPAP at night.  COVID-19 positivity: Admission COVID-19 screen test was -3 days ago, discharged COVID-19 screen test positive.  No pulmonary symptoms. Standard isolation precautions, mask and centralization and 7 days of isolation advised.  Patient is medically stable to discharge home today with family.         Consultants: PCCM, psychiatry Procedures performed: None Disposition: Home. Diet recommendation:  Discharge Diet Orders (From admission, onward)     Start     Ordered   10/10/21 0000  Diet - low sodium heart healthy        10/10/21 1144   10/10/21 0000  Diet Carb Modified        10/10/21 1144           Cardiac and Carb modified diet  DISCHARGE MEDICATION: Allergies as of 10/11/2021  Reactions   Carbamazepine Hives   Adhesive [tape] Rash   Sulfa Antibiotics Rash   Sulfamethoxazole Rash        Medication List      STOP taking these medications    amoxicillin-clavulanate 875-125 MG tablet Commonly known as: AUGMENTIN   buPROPion 150 MG 24 hr tablet Commonly known as: WELLBUTRIN XL   cyclobenzaprine 10 MG tablet Commonly known as: FLEXERIL   gabapentin 800 MG tablet Commonly known as: NEURONTIN Replaced by: gabapentin 400 MG capsule   HYDROcodone-acetaminophen 5-325 MG tablet Commonly known as: NORCO/VICODIN   lamoTRIgine 25 MG tablet Commonly known as: LAMICTAL   lurasidone 80 MG Tabs tablet Commonly known as: LATUDA   OLANZapine 5 MG tablet Commonly known as: ZYPREXA   QUEtiapine 300 MG 24 hr tablet Commonly known as: SEROQUEL XR   sertraline 100 MG tablet Commonly known as: ZOLOFT   traZODone 100 MG tablet Commonly known as: DESYREL   Vitamin D (Ergocalciferol) 1.25 MG (50000 UNIT) Caps capsule Commonly known as: DRISDOL   zolpidem 5 MG tablet Commonly known as: AMBIEN       TAKE these medications    amLODipine 5 MG tablet Commonly known as: NORVASC Take 5 mg by mouth daily.   aspirin 81 MG EC tablet Take 81 mg by mouth daily.   b complex vitamins tablet Take 1 tablet by mouth daily.   Breo Ellipta 100-25 MCG/ACT Aepb Generic drug: fluticasone furoate-vilanterol Inhale 1 puff into the lungs daily.   divalproex 500 MG DR tablet Commonly known as: DEPAKOTE Take 1 tablet (500 mg total) by mouth 2 (two) times daily for 7 days.   FLUoxetine 20 MG capsule Commonly known as: PROZAC Take 3 capsules (60 mg total) by mouth daily. What changed:  medication strength how much to take   fluticasone 50 MCG/ACT nasal spray Commonly known as: FLONASE Place 2 sprays into both nostrils 3 (three) times daily as needed for allergies.   folic acid 1 MG tablet Commonly known as: FOLVITE Take 1 tablet (1 mg total) by mouth daily.   gabapentin 400 MG capsule Commonly known as: NEURONTIN Take 1 capsule (400 mg total) by mouth every 8 (eight) hours. Replaces:  gabapentin 800 MG tablet   ibuprofen 800 MG tablet Commonly known as: ADVIL Take 1 tablet (800 mg total) by mouth every 8 (eight) hours as needed for fever, headache, moderate pain or mild pain.   levothyroxine 175 MCG tablet Commonly known as: SYNTHROID Take 1 tablet (175 mcg total) by mouth daily.   losartan 100 MG tablet Commonly known as: COZAAR Take 100 mg by mouth daily. What changed: Another medication with the same name was removed. Continue taking this medication, and follow the directions you see here.   metFORMIN 1000 MG tablet Commonly known as: GLUCOPHAGE Take 1,000 mg by mouth 2 (two) times daily.   methocarbamol 750 MG tablet Commonly known as: ROBAXIN Take 750 mg by mouth every 6 (six) hours as needed.   Movantik 25 MG Tabs tablet Generic drug: naloxegol oxalate Take 25 mg by mouth daily.   Oxycodone HCl 10 MG Tabs Take 10 mg by mouth every 4 (four) hours as needed (pain).   rosuvastatin 5 MG tablet Commonly known as: CRESTOR Take 5 mg by mouth at bedtime.   temazepam 7.5 MG capsule Commonly known as: RESTORIL Take 7.5 mg by mouth at bedtime as needed for sleep.   thiamine 100 MG tablet Take 1 tablet (100 mg total) by mouth daily.  Discharge Care Instructions  (From admission, onward)           Start     Ordered   10/11/21 0000  Discharge wound care:       Comments: Tuck piece of Aquacel Kellie Simmering # [phone]) into left foot wound Q day, using swab to fill, then cover with foam dressing.   10/11/21 1016   10/10/21 0000  Discharge wound care:       Comments: Tuck piece of Aquacel Kellie Simmering # [phone]) into left foot wound Q day, using swab to fill, then cover with foam dressing.  (Change foam dressing Q 3 days or PRN soiling.)  Moisten previous dressing each time with NS to remove.   10/10/21 1144             Discharge Exam: Filed Weights   10/07/21 1426 10/08/21 0113  Weight: (!) 150.1 kg (!) 154.2 kg   General:  On room  air.  Sitting in couch.   Cardiovascular: S1-S2 normal.  Regular rate rhythm. Respiratory: Bilateral clear.  No added sounds. Gastrointestinal: Soft.  Nontender.  Bowel sounds present.  Obese and pendulous. Ext: No cyanosis or edema.  No deformities. Neuro: Intact. Psych: Anxious Unstageable pressure ulcer left foot.   Condition at discharge: good  The results of significant diagnostics from this hospitalization (including imaging, microbiology, ancillary and laboratory) are listed below for reference.   Imaging Studies: CT Head Wo Contrast  Result Date: 10/07/2021 CLINICAL DATA:  Altered level of consciousness, intoxicated, hypotension EXAM: CT HEAD WITHOUT CONTRAST TECHNIQUE: Contiguous axial images were obtained from the base of the skull through the vertex without intravenous contrast. RADIATION DOSE REDUCTION: This exam was performed according to the departmental dose-optimization program which includes automated exposure control, adjustment of the mA and/or kV according to patient size and/or use of iterative reconstruction technique. COMPARISON:  10/03/2021 FINDINGS: Brain: Stable hypodensities within the bilateral basal ganglia and periventricular white matter consistent with chronic small vessel ischemic change. No evidence of acute infarct or hemorrhage. Lateral ventricles and midline structures are unremarkable. No acute extra-axial fluid collections. No mass effect. Vascular: No hyperdense vessel or unexpected calcification. Skull: Normal. Negative for fracture or focal lesion. Sinuses/Orbits: Stable postsurgical changes from ethmoidectomies and bilateral medial wall antrectomies. Stable mucosal thickening throughout the maxillary, ethmoid, and frontal sinuses. Other: None. IMPRESSION: 1. Stable head CT, no acute intracranial process. Electronically Signed   By: Randa Ngo M.D.   On: 10/07/2021 18:35   CT Chest W Contrast  Result Date: 10/07/2021 CLINICAL DATA:  Hypoxia,  shortness of breath. EXAM: CT CHEST WITH CONTRAST TECHNIQUE: Multidetector CT imaging of the chest was performed during intravenous contrast administration. RADIATION DOSE REDUCTION: This exam was performed according to the departmental dose-optimization program which includes automated exposure control, adjustment of the mA and/or kV according to patient size and/or use of iterative reconstruction technique. CONTRAST:  58mL OMNIPAQUE IOHEXOL 350 MG/ML SOLN COMPARISON:  September 01, 2012. FINDINGS: Cardiovascular: No significant vascular findings. Normal heart size. No pericardial effusion. Mediastinum/Nodes: No enlarged mediastinal, hilar, or axillary lymph nodes. Thyroid gland, trachea, and esophagus demonstrate no significant findings. Lungs/Pleura: No pneumothorax or pleural effusion is noted. Minimal bibasilar subsegmental atelectasis is noted. Upper Abdomen: No acute abnormality. Musculoskeletal: No chest wall abnormality. No acute or significant osseous findings. IMPRESSION: Minimal bibasilar subsegmental atelectasis or scarring. No other significant abnormality seen in the chest. Electronically Signed   By: Marijo Conception M.D.   On: 10/07/2021 18:41   MR FOOT LEFT  WO CONTRAST  Result Date: 10/07/2021 CLINICAL DATA:  Foot swelling, diabetic, osteomyelitis suspected, xray done EXAM: MRI OF THE LEFT FOOT WITHOUT CONTRAST TECHNIQUE: Multiplanar, multisequence MR imaging of the left foot was performed. No intravenous contrast was administered. COMPARISON:  Left radiograph 09/24/2021 FINDINGS: Bones/Joint/Cartilage There is no significant marrow signal abnormality. There is no acute fracture or dislocation. Ligaments Intact Lisfranc ligament.  Intact MTP collateral ligaments. Muscles and Tendons Diffuse intramuscular edema muscle atrophy in the foot as is commonly seen in diabetics. There is no acute tendon tear in the forefoot. Soft tissues There is a plantar soft tissue ulcer at the plantar aspect of the  second toe proximal phalanx. There is adjacent soft tissue swelling without evidence of a well-defined/drainable fluid collection. Diffuse soft tissue swelling of the foot. IMPRESSION: Plantar soft tissue ulcer at the level of the second toe proximal phalanx. Adjacent soft tissue swelling without evidence of soft tissue abscess. No evidence of osteomyelitis. Electronically Signed   By: Maurine Simmering M.D.   On: 10/07/2021 08:42   DG Chest Portable 1 View  Result Date: 10/07/2021 CLINICAL DATA:  Driving or adequately, hypotension, found in car stuck in the median, history diabetes mellitus, hypertension, stroke, asthma EXAM: PORTABLE CHEST 1 VIEW COMPARISON:  Portable exam 1453 hours compared to 10/03/2021 FINDINGS: Slightly prominent cardiac silhouette with pulmonary vascular congestion. Mediastinal contours normal. Interstitial infiltrates in the mid to lower lungs bilaterally, question pulmonary edema versus multifocal infection. No pleural effusion or pneumothorax. Prior spinal fixation. IMPRESSION: Prominent cardiac silhouette with pulmonary vascular congestion and diffuse BILATERAL pulmonary infiltrates of the mid to lower lungs, question pulmonary edema versus multifocal infection. Electronically Signed   By: Lavonia Dana M.D.   On: 10/07/2021 15:12   DG Foot Complete Left  Result Date: 09/24/2021 Please see detailed radiograph report in office note.  ECHOCARDIOGRAM COMPLETE  Result Date: 10/08/2021    ECHOCARDIOGRAM REPORT   Patient Name:   YU PEGGS Boreman Date of Exam: 10/08/2021 Medical Rec #:  [id number]       Height:       77.0 in Accession #:    [id number]      Weight:       339.9 lb Date of Birth:  1961-06-06      BSA:          2.802 m Patient Age:    55 years        BP:           157/88 mmHg Patient Gender: M               HR:           78 bpm. Exam Location:  Inpatient Procedure: 2D Echo, Cardiac Doppler, Color Doppler and Intracardiac            Opacification Agent Indications:    HYPOTENSION  SHOCK  History:        Patient has no prior history of Echocardiogram examinations.                 Stroke; Risk Factors:Hypertension and Diabetes.  Sonographer:    Beryle Beams Referring Phys: [id number] ELISE L STEPHENSON  Sonographer Comments: VERY POOR ACOUSTIC WINDOWS W DEFINITY IMPRESSIONS  1. Left ventricular ejection fraction, by estimation, is 60 to 65%. The left ventricle has normal function. Left ventricular endocardial border not optimally defined to evaluate regional wall motion. This is improved with use of echo-contrast by the LV base is not well visualized; no wall  motion abnormalities seen in views obtained. Left ventricular diastolic parameters are indeterminate.  2. Right ventricular systolic function is normal. The right ventricular size is normal. Tricuspid regurgitation signal is inadequate for assessing PA pressure.  3. The mitral valve was not well visualized. No evidence of mitral valve regurgitation. No evidence of mitral stenosis.  4. The aortic valve was not well visualized. Aortic valve regurgitation is not visualized. No aortic stenosis is present. Comparison(s): No prior Echocardiogram. FINDINGS  Left Ventricle: Left ventricular ejection fraction, by estimation, is 60 to 65%. The left ventricle has normal function. Left ventricular endocardial border not optimally defined to evaluate regional wall motion. The left ventricular internal cavity size was normal in size. Suboptimal image quality limits for assessment of left ventricular hypertrophy. Left ventricular diastolic parameters are indeterminate. Right Ventricle: The right ventricular size is normal. Right vetricular wall thickness was not well visualized. Right ventricular systolic function is normal. Tricuspid regurgitation signal is inadequate for assessing PA pressure. Left Atrium: Left atrial size was normal in size. Right Atrium: Right atrial size was normal in size. Pericardium: Trivial pericardial effusion is present. Mitral  Valve: The mitral valve was not well visualized. No evidence of mitral valve regurgitation. No evidence of mitral valve stenosis. Tricuspid Valve: The tricuspid valve is not well visualized. Tricuspid valve regurgitation is not demonstrated. No evidence of tricuspid stenosis. Aortic Valve: The aortic valve was not well visualized. Aortic valve regurgitation is not visualized. No aortic stenosis is present. Aortic valve mean gradient measures 4.0 mmHg. Aortic valve peak gradient measures 7.1 mmHg. Aortic valve area, by VTI measures 4.44 cm. Pulmonic Valve: The pulmonic valve was not well visualized. Pulmonic valve regurgitation is not visualized. No evidence of pulmonic stenosis. Aorta: The aortic root and ascending aorta are structurally normal, with no evidence of dilitation. Venous: The inferior vena cava was not well visualized. IAS/Shunts: No atrial level shunt detected by color flow Doppler.  LEFT VENTRICLE PLAX 2D LVIDd:         5.50 cm      Diastology LVIDs:         3.10 cm      LV e' medial:    9.03 cm/s LV PW:         1.30 cm      LV E/e' medial:  12.0 LV IVS:        1.10 cm      LV e' lateral:   11.40 cm/s LVOT diam:     2.80 cm      LV E/e' lateral: 9.5 LV SV:         104 LV SV Index:   37 LVOT Area:     6.16 cm  LV Volumes (MOD) LV vol d, MOD A2C: 154.0 ml LV vol d, MOD A4C: 129.0 ml LV vol s, MOD A2C: 112.0 ml LV vol s, MOD A4C: 76.9 ml LV SV MOD A2C:     42.0 ml LV SV MOD A4C:     129.0 ml LV SV MOD BP:      45.2 ml RIGHT VENTRICLE             IVC RV S prime:     13.90 cm/s  IVC diam: 3.20 cm TAPSE (M-mode): 2.6 cm LEFT ATRIUM             Index        RIGHT ATRIUM           Index LA diam:  4.80 cm 1.71 cm/m   RA Area:     17.90 cm LA Vol (A2C):   81.8 ml 29.19 ml/m  RA Volume:   51.80 ml  18.48 ml/m LA Vol (A4C):   52.0 ml 18.55 ml/m LA Biplane Vol: 67.6 ml 24.12 ml/m  AORTIC VALVE                    PULMONIC VALVE AV Area (Vmax):    3.85 cm     PV Vmax:       0.68 m/s AV Area (Vmean):    3.68 cm     PV Vmean:      46.000 cm/s AV Area (VTI):     4.44 cm     PV VTI:        0.126 m AV Vmax:           133.50 cm/s  PV Peak grad:  1.9 mmHg AV Vmean:          91.600 cm/s  PV Mean grad:  1.0 mmHg AV VTI:            0.234 m AV Peak Grad:      7.1 mmHg AV Mean Grad:      4.0 mmHg LVOT Vmax:         83.40 cm/s LVOT Vmean:        54.800 cm/s LVOT VTI:          0.169 m LVOT/AV VTI ratio: 0.72  AORTA Ao Root diam: 3.20 cm Ao Asc diam:  3.30 cm MITRAL VALVE MV Area (PHT): 3.85 cm     SHUNTS MV Decel Time: 197 msec     Systemic VTI:  0.17 m MV E velocity: 108.00 cm/s  Systemic Diam: 2.80 cm MV A velocity: 72.35 cm/s MV E/A ratio:  1.49 Rudean Haskell MD Electronically signed by Rudean Haskell MD Signature Date/Time: 10/08/2021/12:59:46 PM    Final     Microbiology: Results for orders placed or performed during the hospital encounter of 10/07/21  Blood culture (routine x 2)     Status: None (Preliminary result)   Collection Time: 10/07/21  3:35 PM   Specimen: BLOOD RIGHT ARM  Result Value Ref Range Status   Specimen Description BLOOD RIGHT ARM  Final   Special Requests   Final    BOTTLES DRAWN AEROBIC AND ANAEROBIC Blood Culture adequate volume   Culture   Final    NO GROWTH 4 DAYS Performed at [facility], [address] [place], [place] [address]    Report Status PENDING  Incomplete  Blood culture (routine x 2)     Status: None (Preliminary result)   Collection Time: 10/07/21  3:50 PM   Specimen: BLOOD RIGHT HAND  Result Value Ref Range Status   Specimen Description BLOOD RIGHT HAND  Final   Special Requests   Final    BOTTLES DRAWN AEROBIC AND ANAEROBIC Blood Culture adequate volume   Culture   Final    NO GROWTH 4 DAYS Performed at [facility], [place] [address] [place], [place] [address]    Report Status PENDING  Incomplete  Resp Panel by RT-PCR (Flu A&B, Covid) Nasopharyngeal Swab     Status: None   Collection Time: 10/07/21 10:05 PM   Specimen:  Nasopharyngeal Swab; Nasopharyngeal(NP) swabs in vial transport medium  Result Value Ref Range Status   SARS Coronavirus 2 by RT PCR NEGATIVE NEGATIVE Final    Comment: (NOTE) SARS-CoV-2 target  nucleic acids are NOT DETECTED.  The SARS-CoV-2 RNA is generally detectable in upper respiratory specimens during the acute phase of infection. The lowest concentration of SARS-CoV-2 viral copies this assay can detect is 138 copies/mL. A negative result does not preclude SARS-Cov-2 infection and should not be used as the sole basis for treatment or other patient management decisions. A negative result may occur with  improper specimen collection/handling, submission of specimen other than nasopharyngeal swab, presence of viral mutation(s) within the areas targeted by this assay, and inadequate number of viral copies(<138 copies/mL). A negative result must be combined with clinical observations, patient history, and epidemiological information. The expected result is Negative.  Fact Sheet for Patients:  EntrepreneurPulse.com.au  Fact Sheet for Healthcare Providers:  IncredibleEmployment.be  This test is no t yet approved or cleared by the [place] FDA and  has been authorized for detection and/or diagnosis of SARS-CoV-2 by FDA under an Emergency Use Authorization (EUA). This EUA will remain  in effect (meaning this test can be used) for the duration of the COVID-19 declaration under Section 564(b)(1) of the Act, 21 U.S.C.section 360bbb-3(b)(1), unless the authorization is terminated  or revoked sooner.       Influenza A by PCR NEGATIVE NEGATIVE Final   Influenza B by PCR NEGATIVE NEGATIVE Final    Comment: (NOTE) The Xpert Xpress SARS-CoV-2/FLU/RSV plus assay is intended as an aid in the diagnosis of influenza from Nasopharyngeal swab specimens and should not be used as a sole basis for treatment. Nasal washings and aspirates are unacceptable for  Xpert Xpress SARS-CoV-2/FLU/RSV testing.  Fact Sheet for Patients: EntrepreneurPulse.com.au  Fact Sheet for Healthcare Providers: IncredibleEmployment.be  This test is not yet approved or cleared by the [place] FDA and has been authorized for detection and/or diagnosis of SARS-CoV-2 by FDA under an Emergency Use Authorization (EUA). This EUA will remain in effect (meaning this test can be used) for the duration of the COVID-19 declaration under Section 564(b)(1) of the Act, 21 U.S.C. section 360bbb-3(b)(1), unless the authorization is terminated or revoked.  Performed at [facility], [place] [address] [place], [place] [address]   MRSA Next Gen by PCR, Nasal     Status: None   Collection Time: 10/08/21  1:13 AM   Specimen: Nasal Mucosa; Nasal Swab  Result Value Ref Range Status   MRSA by PCR Next Gen NOT DETECTED NOT DETECTED Final    Comment: (NOTE) The GeneXpert MRSA Assay (FDA approved for NASAL specimens only), is one component of a comprehensive MRSA colonization surveillance program. It is not intended to diagnose MRSA infection nor to guide or monitor treatment for MRSA infections. Test performance is not FDA approved in patients less than 22 years old. Performed at [facility], [place] [address], [place], [place] [address]   Resp Panel by RT-PCR (Flu A&B, Covid) Nasopharyngeal Swab     Status: Abnormal   Collection Time: 10/10/21 11:39 AM   Specimen: Nasopharyngeal Swab; Nasopharyngeal(NP) swabs in vial transport medium  Result Value Ref Range Status   SARS Coronavirus 2 by RT PCR POSITIVE (A) NEGATIVE Final    Comment: (NOTE) SARS-CoV-2 target nucleic acids are DETECTED.  The SARS-CoV-2 RNA is generally detectable in upper respiratory specimens during the acute phase of infection. Positive results are indicative of the presence of the identified virus, but do not rule out bacterial infection or co-infection with  other pathogens not detected by the test. Clinical correlation with patient history and other diagnostic information  is necessary to determine patient infection status. The expected result is Negative.  Fact Sheet for Patients: EntrepreneurPulse.com.au  Fact Sheet for Healthcare Providers: IncredibleEmployment.be  This test is not yet approved or cleared by the [place] FDA and  has been authorized for detection and/or diagnosis of SARS-CoV-2 by FDA under an Emergency Use Authorization (EUA).  This EUA will remain in effect (meaning this test can be used) for the duration of  the COVID-19 declaration under Section 564(b)(1) of the A ct, 21 U.S.C. section 360bbb-3(b)(1), unless the authorization is terminated or revoked sooner.     Influenza A by PCR NEGATIVE NEGATIVE Final   Influenza B by PCR NEGATIVE NEGATIVE Final    Comment: (NOTE) The Xpert Xpress SARS-CoV-2/FLU/RSV plus assay is intended as an aid in the diagnosis of influenza from Nasopharyngeal swab specimens and should not be used as a sole basis for treatment. Nasal washings and aspirates are unacceptable for Xpert Xpress SARS-CoV-2/FLU/RSV testing.  Fact Sheet for Patients: EntrepreneurPulse.com.au  Fact Sheet for Healthcare Providers: IncredibleEmployment.be  This test is not yet approved or cleared by the [place] FDA and has been authorized for detection and/or diagnosis of SARS-CoV-2 by FDA under an Emergency Use Authorization (EUA). This EUA will remain in effect (meaning this test can be used) for the duration of the COVID-19 declaration under Section 564(b)(1) of the Act, 21 U.S.C. section 360bbb-3(b)(1), unless the authorization is terminated or revoked.  Performed at [facility], [place] [address], [place], [place] [address]     Labs: CBC: Recent Labs  Lab 10/07/21 1450 10/07/21 1604 10/08/21 0213  10/10/21 0220  WBC 10.4  --  6.5 9.5  NEUTROABS 5.7  --  3.7 6.0  HGB 14.1 14.3 14.5 14.2  HCT 43.2 42.0 43.2 43.2  MCV 93.7  --  92.3 92.7  PLT 292  --  259 252   Basic Metabolic Panel: Recent Labs  Lab 10/07/21 1450 10/07/21 1604 10/08/21 0213 10/10/21 0220 10/11/21 0452  NA 135 136 138 132* 133*  K 4.0 3.8 4.0 4.1 4.2  CL 101  --  104 98 96*  CO2 22  --  23 28 28   GLUCOSE 88  --  124* 109* 124*  BUN 9  --  7* 8 7*  CREATININE 0.93  --  0.83 0.91 0.90  CALCIUM 8.6*  --  8.5* 8.6* 8.8*  MG  --   --  1.5* 1.8  --   PHOS  --   --  2.6 2.4*  --    Liver Function Tests: Recent Labs  Lab 10/07/21 1450 10/08/21 0213 10/10/21 0220 10/11/21 0452  AST 25 27 21  33  ALT 24 28 25  35  ALKPHOS 68 72 69 74  BILITOT 0.8 0.5 0.6 0.6  PROT 6.3* 6.4* 6.4* 6.3*  ALBUMIN 3.3* 3.2* 3.1* 3.2*   CBG: Recent Labs  Lab 10/10/21 0752 10/10/21 1208 10/10/21 1524 10/10/21 2126 10/11/21 0806  GLUCAP 144* 132* 134* 139* 142*    Discharge time spent: greater than 30 minutes.  Signed: Barb Merino, MD [facility] 10/11/2021

## 2021-10-11 NOTE — Plan of Care (Signed)

## 2021-10-11 NOTE — Progress Notes (Signed)
In report from [place], patient's wife stated to 67M nurse if patient will still get rehab after his 10 day home quarantine due to positive Covid?  Will someone contact them or should they call the hospital?  How will this be handled? ? ?She is requesting a call back from MD regarding this matter.  Her cell phone number is [phone]. ? ?  ?

## 2021-10-11 NOTE — Plan of Care (Signed)

## 2021-10-12 LAB — CULTURE, BLOOD (ROUTINE X 2)
Culture: NO GROWTH
Culture: NO GROWTH
Special Requests: ADEQUATE
Special Requests: ADEQUATE

## 2021-10-15 ENCOUNTER — Ambulatory Visit: Payer: BC Managed Care – PPO | Admitting: Podiatry

## 2021-10-17 DIAGNOSIS — E11621 Type 2 diabetes mellitus with foot ulcer: Secondary | ICD-10-CM | POA: Diagnosis not present

## 2021-10-17 DIAGNOSIS — L97422 Non-pressure chronic ulcer of left heel and midfoot with fat layer exposed: Secondary | ICD-10-CM | POA: Diagnosis not present

## 2021-10-21 DIAGNOSIS — L97422 Non-pressure chronic ulcer of left heel and midfoot with fat layer exposed: Secondary | ICD-10-CM | POA: Diagnosis not present

## 2021-10-21 DIAGNOSIS — E11621 Type 2 diabetes mellitus with foot ulcer: Secondary | ICD-10-CM | POA: Diagnosis not present

## 2021-10-22 ENCOUNTER — Other Ambulatory Visit: Payer: Self-pay

## 2021-10-22 ENCOUNTER — Encounter: Payer: Self-pay | Admitting: Podiatry

## 2021-10-22 ENCOUNTER — Ambulatory Visit (INDEPENDENT_AMBULATORY_CARE_PROVIDER_SITE_OTHER): Payer: BC Managed Care – PPO | Admitting: Podiatry

## 2021-10-22 ENCOUNTER — Ambulatory Visit (HOSPITAL_COMMUNITY)
Admission: EM | Admit: 2021-10-22 | Discharge: 2021-10-22 | Disposition: A | Discharge status: Home / Self Care | Payer: BC Managed Care – PPO | Attending: Psychiatry | Admitting: Psychiatry

## 2021-10-22 DIAGNOSIS — L97522 Non-pressure chronic ulcer of other part of left foot with fat layer exposed: Secondary | ICD-10-CM

## 2021-10-22 DIAGNOSIS — E0843 Diabetes mellitus due to underlying condition with diabetic autonomic (poly)neuropathy: Secondary | ICD-10-CM | POA: Diagnosis not present

## 2021-10-22 DIAGNOSIS — F3181 Bipolar II disorder: Secondary | ICD-10-CM

## 2021-10-22 MED ORDER — ALUM & MAG HYDROXIDE-SIMETH 200-200-20 MG/5ML PO SUSP: 30.0000 mL | ORAL | Status: DC | PRN

## 2021-10-22 MED ORDER — ACETAMINOPHEN 325 MG PO TABS: 650.0000 mg | ORAL_TABLET | Freq: Four times a day (QID) | ORAL | Status: DC | PRN

## 2021-10-22 MED ORDER — DIVALPROEX SODIUM 500 MG PO DR TAB: 500.0000 mg | DELAYED_RELEASE_TABLET | Freq: Two times a day (BID) | ORAL | 0 refills | Status: DC

## 2021-10-22 MED ORDER — MAGNESIUM HYDROXIDE 400 MG/5ML PO SUSP: 30.0000 mL | Freq: Every day | ORAL | Status: DC | PRN

## 2021-10-22 NOTE — Discharge Instructions (Addendum)
Patient is instructed prior to discharge to: Take all medications as prescribed by his/her mental healthcare provider. ?Report any adverse effects and or reactions from the medicines to his/her outpatient provider promptly. ?Patient has been instructed & cautioned: To not engage in alcohol and or illegal drug use while on prescription medicines. ?In the event of worsening symptoms, patient is instructed to call the crisis hotline, 911 and or go to the nearest ED for appropriate evaluation and treatment of symptoms. ?To follow-up with his/her primary care provider for your other medical issues, concerns and or health care needs. ? ?The suicide prevention education provided includes the following: ?Suicide risk factors ?Suicide prevention and interventions ?National Suicide Hotline telephone number ?[facility] assessment telephone number ?[facility] Emergency Assistance 911 ?[place] and/or Residential Mobile Crisis Unit telephone number ?  ?Request made of family/significant other to: ?Remove weapons (e.g., guns, rifles, knives), all items previously/currently identified as safety concern.   ?Remove drugs/medications (over the counter, prescriptions, illicit drugs), all items previously/currently identified as a safety concern.  ? ? ?Please contact one of the following facilities to start medication management and therapy services:  ? ?[place] [facility] ?[address]  ?[place], [place] [address] ?[phone]  ? ?[facility]  ?[place], [place] [address] ?[phone] ? ?[facility]  ?[place], [place], [place] [address] ?[phone] ? ?[place]  ?[address]  ?[place], [place] [address] ?[phone] ?[facility]  ?[address] ?[place], [place] [address] ?[phone] ? ?[place]  ?[address],  ?[place], [place] [address] ?[phone]  ?Please present to one of the following facilities to start medication management and therapy services:  ? ?[place] [facility]  ?[address],  ?[place], [place] [address] ?[phone] ? ? ?[facility]  ?[address],  ?[place], [place] [address] ?[phone] ? ? Please present to one of the following facilities to start medication management and therapy services:  ? ?[place]  ?[address],  ?[place], [place] [address] ?[phone] ? ? ?[facility]    ?[address]  ?[place], [place] [address] ?[phone]    ?

## 2021-10-22 NOTE — Addendum Note (Signed)
Addended by: Graceann Congress D on: 10/22/2021 11:42 AM ? ? Modules accepted: Orders ? ?

## 2021-10-22 NOTE — Progress Notes (Signed)
? ?Subjective:  ?61 y.o. male with PMHx of diabetes mellitus presenting for evaluation of an ulcer that the patient has noticed for about 4 weeks to the left forefoot.  Patient states that he is taking the wound very seriously and collectively has been treated by the wound care center as well as his PCP weekly.  Patient has been packing the wound with silver alginate dressings.  Today he presents wearing hey dude shoes.  He is currently on Augmentin 875/125 mg.  He presents for further treatment and evaluation ? ? ?Past Medical History:  ?Diagnosis Date  ? Arthritis   ? Asthma   ? Bipolar affective ([place])   ? Complication of anesthesia   ? has woken up during surgery before   ? Diabetes mellitus without complication ([place])   ? Headache(784.0)   ? Hypertension   ? Hypothyroidism   ? Pneumonia   ? PTSD (post-traumatic stress disorder)   ? Sleep apnea   ? CPAP  ? Stroke [facility])   ? "light" stroke  ? ? ? ?  ?Objective/Physical Exam ?General: The patient is alert and oriented x3 in no acute distress. ? ?Dermatology:  ?Wound #1 noted to the plantar aspect of the left forefoot plantar second MTP continues to measurE approximately 1.7 x 1.2 x 1.0 cm (LxWxD).  No significant improvement since last visit ? ?To the noted ulceration(s), there is no eschar.  There is an extensive amount of fibrotic tissue and debris within the wound.  After debridement there is a granular wound base.  It does not probe to bone however it does appear to probe to the joint capsule.  There is concern for osteomyelitis of the foot due to the depth of the wound.  No malodor.  No purulence. ?Skin is warm, dry and supple bilateral lower extremities. ? ?Vascular: Palpable pedal pulses bilaterally.  Skin is warm to touch.  Moderate edema with some mild erythema noted to the forefoot.  No clinical concern for vascular compromise ? ?Neurological: Epicritic and protective threshold diminished bilaterally.  ? ?Musculoskeletal Exam: No pedal deformity  noted ? ?MR FOOT LT WO CONTRAST 10/04/2021 ?Soft tissues ?There is a plantar soft tissue ulcer at the plantar aspect of the ?second toe proximal phalanx. There is adjacent soft tissue swelling ?without evidence of a well-defined/drainable fluid collection. ?Diffuse soft tissue swelling of the foot. ?  ?IMPRESSION: ?Plantar soft tissue ulcer at the level of the second toe proximal ?phalanx. Adjacent soft tissue swelling without evidence of soft ?tissue abscess. No evidence of osteomyelitis. ? ?Assessment: ?1.  Ulcer left second MTP secondary to diabetes mellitus ?2. diabetes mellitus w/ peripheral neuropathy ? ?Plan of Care:  ?1. Patient was evaluated.  MRI results reviewed today ?2. medically necessary excisional debridement including muscle and deep fascial tissue was performed using a tissue nipper and a chisel blade. Excisional debridement of all the necrotic nonviable tissue down to healthy bleeding viable tissue was performed with post-debridement measurements same as pre-. ?3. the wound was cleansed and dry sterile dressing applied. ?4.  Continue weekly care at the [facility] wound care center ?5.  Given the depth and clinical appearance of the wound to the plantar aspect of the second MTP joint I do believe that aggressive management including second metatarsal head resection is warranted.  This should alleviate pressure from the wound and allow for better healing.  After discussing this with the patient he agrees.  All possible complications and details the procedure were explained.  No guarantees were  expressed or implied.  All patient questions answered. ?6.  Authorization for surgery was initiated today.  Surgery will consist of second metatarsal head resection left.  Excisional debridement of ulcer left. ?7.  Cultures were also taken and sent to pathology for culture and sensitivity ?8.  Will need clearance from PCP prior to surgery  ?9.  Return to clinic 1 week postop ? ? ?Edrick Kins, DPM ?[place] ? ?Dr. Edrick Kins, DPM  ?  ?[address]                                        ?[place], [place] [address]                ?Office [phone]  ?Fax [phone] ? ? ? ? ?

## 2021-10-22 NOTE — Progress Notes (Signed)
TRIAGE: URGENT ? ? 10/22/21 1130  ?[place] Triage Screening (Walk-ins at [facility] only)  ?How Did You Hear About [place]? Family/Friend  ?What Is the Reason for Your Visit/Call Today? Pt states he's been experiencing SI for several weeks. He states he has no plan and no intent but that he's been thinking about different ways in which he could kill himself. Pt denies he's ever attempted to kill himself in the past, though he states he's been hospitalized multiple times for mental health concerns in the past. Pt denies HI, AVH, NSSIB, access to guns/weapons, or engagement with the legal system. Pt denies current EtOH use, though acknowledges he has a hx of EtOH abuse. He states he's been clean for a few weeks, though he states he used to drink 1/5 daily.  ?How Long Has This Been Causing You Problems? 1-6 months  ?Have You Recently Had Any Thoughts About Hurting Yourself? Yes  ?How long ago did you have thoughts about hurting yourself? Yesterday  ?Are You Planning to Commit Suicide/Harm Yourself At This time? No  ?Have you Recently Had Thoughts About [place]? No  ?Are You Planning To Harm Someone At This Time? No  ?Are you currently experiencing any auditory, visual or other hallucinations? No  ?Have You Used Any Alcohol or Drugs in the Past 24 Hours? No  ?Do you have any current medical co-morbidities that require immediate attention? No  ?Clinician description of patient physical appearance/behavior: Pt is dressed in appropriate attire. He has a boot on his foot due to a sore on the ball of his foot.  ?What Do You Feel Would Help You the Most Today? Alcohol or Drug Use Treatment;Treatment for Depression or other mood problem;Medication(s)  ?If access to [facility] was not available, would you have sought care in the Emergency Department? No  ?Determination of Need Urgent (48 hours)  ?Options For Referral Medication Management;Facility-Based Crisis;Outpatient Therapy  ? ? ?

## 2021-10-22 NOTE — ED Provider Notes (Addendum)
Behavioral Health Urgent Care Medical Screening Exam ? ?Patient Name: Rodney Leach ?MRN: [id number] ?Date of Evaluation: 10/22/21 ?Chief Complaint:   ?Diagnosis:  ?Final diagnoses:  ?Bipolar II disorder ([place])  ? ? ?History of Present illness: Rodney Leach is a 61 y.o. male patient presented to [facility] as a walk in  accompanied by his spouse with complaints of increased depression, anxiety, and SI.  ? ?[name], 61 y.o., male patient seen face to face by this provider, consulted with Dr. Serafina Mitchell; and chart reviewed on 10/22/21.  Patient reports a past psychiatric history of MDD, anxiety, PTSD, and bipolar disorder.  He is prescribed psychotropic medications that include Depakote 500 mg twice daily and fluoxetine 20 mg daily. ? ?On evaluation Rodney Leach reports he was admitted to [facility].  He was evaluated by psychiatry at that time and was recommended for inpatient psychiatric admission.  Patient had been admitted to [facility] however he tested positive for COVID on 10/10/2021 and his admission was canceled.  He was discharged home with a 7-day sample of Depakote.  Patient reports he has been unable to obtain an outpatient psychiatric appointment for medication management.  He has now been out of Depakote for 2 days.  Patient denies any substance use.  States he has not drank in over a week. ? ?During evaluation [name] is in sitting position in no acute distress.  He is fairly groomed and makes good eye contact.  His speech is at a moderate rate and tone.  He is alert/oriented x4 and cooperative.  He endorses increased depression and anxiety.  He endorses decreased focus, low energy, low motivation, helplessness, and hopelessness.  He denies any concerns with appetite or sleep.  There is no indication that he is currently responding to internal/external stimuli.  He denies paranoid and delusional thought content.  He denies AVH and HI.  He endorses passive suicidal ideations.  He  denies any intent, plan, or access to means.  His spouse is present during the assessment she has no immediate safety concerns with patient returning home.   ? ?Initially patient was unsure if he could contract for safety.  [facility] was notified and there was no bed availability.  Social worker was notified and patient was going to be faxed out.  However nursing notified this writer that patient takes a CPAP machine at nighttime.  Patient states he could go without it, he does not want to but if he needed to be without it during an admission it would be okay.  He also has a wound on his left upper part of his foot that has to be cleaned by wound care weekly.  It also requires a daily dressing change.  Social worker educated that due to CPAP machine placement would be difficult provided outpatient psychiatric resources for medication management and therapy. Conducted safety planning that includes suicide prevention/intervention with patient and his spouse.  Request was made to remove any object that could be identified as a safety concern. ? ?At this time Rodney Leach is educated and verbalizes understanding of mental health resources and other crisis services in the community. He is instructed to call 911 and present to the nearest emergency room should He experience any suicidal/homicidal ideation, auditory/visual/hallucinations, or detrimental worsening of His mental health condition.    ? ?Psychiatric Specialty Exam ? ?Presentation  ?General Appearance:Appropriate for Environment; Casual ? ?Eye Contact:Good ? ?Speech:Clear and Coherent; Normal Rate ? ?  Speech Volume:Normal ? ?Handedness:Right ? ? ?Mood and Affect  ?Mood:Anxious; Depressed ? ?Affect:Congruent ? ? ?Thought Process  ?Thought Processes:Coherent ? ?Descriptions of Associations:Intact ? ?Orientation:Full (Time, Place and Person) ? ?Thought Content:Logical ?   Hallucinations:None ? ?Ideas of Reference:None ? ?Suicidal Thoughts:Yes, Passive ?Without  Intent; Without Plan; Without Means to Carry Out ? ?Homicidal Thoughts:No ? ? ?Sensorium  ?Memory:Immediate Good; Recent Good; Remote Good ? ?Judgment:Good ? ?Insight:Good ? ? ?Executive Functions  ?Concentration:Good ? ?Attention Span:Good ? ?Recall:Good ? ?Fund of [place] ? ?Language:Good ? ? ?Psychomotor Activity  ?Psychomotor Activity:Normal ? ? ?Assets  ?Assets:Communication Skills; Desire for Improvement; Financial Resources/Insurance; Housing; Physical Health; Resilience; Social Support ? ? ?Sleep  ?Sleep:Good ? ?Number of hours: No data recorded ? ?No data recorded ? ?Physical Exam: ?Physical Exam ?Vitals and nursing note reviewed.  ?Constitutional:   ?   General: He is not in acute distress. ?   Appearance: He is well-developed.  ?HENT:  ?   Head: Normocephalic and atraumatic.  ?Eyes:  ?   General:     ?   Right eye: No discharge.     ?   Left eye: No discharge.  ?   Conjunctiva/sclera: Conjunctivae normal.  ?Cardiovascular:  ?   Rate and Rhythm: Normal rate.  ?Pulmonary:  ?   Effort: Pulmonary effort is normal. No respiratory distress.  ?Musculoskeletal:     ?   General: Normal range of motion.  ?   Cervical back: Normal range of motion.  ?Skin: ?   Coloration: Skin is not jaundiced or pale.  ?Neurological:  ?   Mental Status: He is alert and oriented to person, place, and time.  ?Psychiatric:     ?   Attention and Perception: Attention and perception normal.     ?   Mood and Affect: Mood is anxious and depressed.     ?   Speech: Speech normal.     ?   Behavior: Behavior is cooperative.     ?   Thought Content: Thought content includes suicidal ideation. Thought content does not include suicidal plan.     ?   Cognition and Memory: Cognition normal.     ?   Judgment: Judgment normal.  ? ?Review of Systems  ?Constitutional: Negative.   ?HENT: Negative.    ?Eyes: Negative.   ?Respiratory: Negative.    ?Cardiovascular: Negative.   ?Musculoskeletal: Negative.   ?Skin: Negative.   ?Neurological: Negative.    ?Psychiatric/Behavioral:  Positive for depression and suicidal ideas. The patient is nervous/anxious.   ?Blood pressure (!) 154/77, pulse 77, temperature 99.1 ?F (37.3 ?C), temperature source Oral, resp. rate 20, SpO2 97 %. There is no height or weight on file to calculate BMI. ? ?Musculoskeletal: ?Strength & Muscle Tone: within normal limits ?Gait & Station: normal ?Patient leans: N/A ? ? ?[facility] MSE Discharge Disposition for Follow up and Recommendations: ?Based on my evaluation the patient does not appear to have an emergency medical condition and can be discharged with resources and follow up care in outpatient services for Medication Management and Individual Therapy ? ?Discharge patient. ? ?Provided outpatient psychiatric resources for medication management and therapeutic services. ? ?Provided prescription for Depakote 500 mg twice daily.  Instructed patient that he will need to follow-up with a provider in roughly 2-3 weeks for Depakote level.  Educated on adverse reactions Depakote. ? ?Safety planning/suicide prevention/intervention conducted ? ? ?Revonda Humphrey, NP ?10/22/2021, 6:37 PM ? ?

## 2021-10-23 ENCOUNTER — Encounter: Payer: Self-pay | Admitting: Podiatry

## 2021-10-28 DIAGNOSIS — E11621 Type 2 diabetes mellitus with foot ulcer: Secondary | ICD-10-CM | POA: Diagnosis not present

## 2021-10-28 DIAGNOSIS — L97422 Non-pressure chronic ulcer of left heel and midfoot with fat layer exposed: Secondary | ICD-10-CM | POA: Diagnosis not present

## 2021-10-30 DIAGNOSIS — F3162 Bipolar disorder, current episode mixed, moderate: Secondary | ICD-10-CM | POA: Diagnosis not present

## 2021-10-30 DIAGNOSIS — F1010 Alcohol abuse, uncomplicated: Secondary | ICD-10-CM | POA: Diagnosis not present

## 2021-11-05 DIAGNOSIS — E1140 Type 2 diabetes mellitus with diabetic neuropathy, unspecified: Secondary | ICD-10-CM | POA: Diagnosis not present

## 2021-11-05 DIAGNOSIS — E039 Hypothyroidism, unspecified: Secondary | ICD-10-CM | POA: Diagnosis not present

## 2021-11-05 DIAGNOSIS — I10 Essential (primary) hypertension: Secondary | ICD-10-CM | POA: Diagnosis not present

## 2021-11-05 DIAGNOSIS — E11621 Type 2 diabetes mellitus with foot ulcer: Secondary | ICD-10-CM | POA: Diagnosis not present

## 2021-11-05 DIAGNOSIS — E782 Mixed hyperlipidemia: Secondary | ICD-10-CM | POA: Diagnosis not present

## 2021-11-05 DIAGNOSIS — L97422 Non-pressure chronic ulcer of left heel and midfoot with fat layer exposed: Secondary | ICD-10-CM | POA: Diagnosis not present

## 2021-11-11 ENCOUNTER — Telehealth: Payer: Self-pay | Admitting: Urology

## 2021-11-11 ENCOUNTER — Telehealth (HOSPITAL_COMMUNITY): Payer: Self-pay | Admitting: Family Medicine

## 2021-11-11 NOTE — BH Assessment (Signed)
Care Management - [facility] Follow Up Discharges  ° °Writer attempted to make contact with patient today and was unsuccessful.  Writer left a HIPPA compliant voice message.  ° °Per chart review, patient was provided with outpatient resources. ° °

## 2021-11-11 NOTE — Telephone Encounter (Signed)
DOS - 11/14/21 ? ?MET HEAD RESECTION 2ND LEFT --- 28112 ?DEBRIDE ULCER LEFT --- 01007 ? ? ?[employer] EFFECTIVE DATE - 08/11/21 ? ?PLAN DEDUCTIBLE - $5,000.00 W/ $0.00  REMAINING  ?OUT OF POCKET - $8,150.00 W/ $0.00 REMAINING ?COINSURANCE - 25% ?COPAY - $0.00 ? ? ?RECEIVED FAX FROM [employer] STATING THAT CPT CODES 12197 AND 58832 NO PRIOR AUTH IS REQUIRED.  ?

## 2021-11-12 ENCOUNTER — Encounter: Payer: BC Managed Care – PPO | Admitting: Podiatry

## 2021-11-13 DIAGNOSIS — F3162 Bipolar disorder, current episode mixed, moderate: Secondary | ICD-10-CM | POA: Diagnosis not present

## 2021-11-13 DIAGNOSIS — F1010 Alcohol abuse, uncomplicated: Secondary | ICD-10-CM | POA: Diagnosis not present

## 2021-11-14 ENCOUNTER — Other Ambulatory Visit: Payer: Self-pay | Admitting: Podiatry

## 2021-11-14 ENCOUNTER — Encounter: Payer: Self-pay | Admitting: Podiatry

## 2021-11-14 DIAGNOSIS — L97522 Non-pressure chronic ulcer of other part of left foot with fat layer exposed: Secondary | ICD-10-CM | POA: Diagnosis not present

## 2021-11-14 DIAGNOSIS — L89893 Pressure ulcer of other site, stage 3: Secondary | ICD-10-CM | POA: Diagnosis not present

## 2021-11-14 MED ORDER — OXYCODONE-ACETAMINOPHEN 5-325 MG PO TABS: 1.0000 | ORAL_TABLET | ORAL | 0 refills | Status: DC | PRN

## 2021-11-14 MED ORDER — DOXYCYCLINE HYCLATE 100 MG PO TABS: 100.0000 mg | ORAL_TABLET | Freq: Two times a day (BID) | ORAL | 0 refills | Status: DC

## 2021-11-14 NOTE — Progress Notes (Signed)
PRN postop 

## 2021-11-19 ENCOUNTER — Encounter: Payer: BC Managed Care – PPO | Admitting: Podiatry

## 2021-11-20 DIAGNOSIS — Z87891 Personal history of nicotine dependence: Secondary | ICD-10-CM | POA: Diagnosis not present

## 2021-11-20 DIAGNOSIS — F17210 Nicotine dependence, cigarettes, uncomplicated: Secondary | ICD-10-CM | POA: Diagnosis not present

## 2021-11-21 ENCOUNTER — Encounter: Payer: Self-pay | Admitting: Podiatry

## 2021-11-21 ENCOUNTER — Ambulatory Visit (INDEPENDENT_AMBULATORY_CARE_PROVIDER_SITE_OTHER): Payer: BC Managed Care – PPO | Admitting: Podiatry

## 2021-11-21 ENCOUNTER — Ambulatory Visit (INDEPENDENT_AMBULATORY_CARE_PROVIDER_SITE_OTHER): Payer: BC Managed Care – PPO

## 2021-11-21 VITALS — BP 90/53 | HR 88 | Temp 97.9°F

## 2021-11-21 DIAGNOSIS — E0843 Diabetes mellitus due to underlying condition with diabetic autonomic (poly)neuropathy: Secondary | ICD-10-CM

## 2021-11-21 DIAGNOSIS — L97522 Non-pressure chronic ulcer of other part of left foot with fat layer exposed: Secondary | ICD-10-CM

## 2021-11-21 NOTE — Progress Notes (Signed)
?Subjective:  ?Patient ID: Rodney Leach, male    DOB: 17-May-1961,  MRN: [id number] ? ?Chief Complaint  ?Patient presents with  ? Routine Post Op  ?  POV #1 DOS 11/14/2021 2ND METATARSAL HEAD RESECTION LR, DEBRIDEMENT OF ULCER LT FOOT/DR EVANS PT  ? ? ?DOS: 11/14/2021 ?Procedure: Second metatarsal head resection left ? ?61 y.o. male returns for post-op check.  Patient states that he is doing well.  No acute complaints.  Minimal pain.  Pain medication helps some.  Bandages clean dry and intact.  The wound is doing okay ? ?Review of Systems: Negative except as noted in the HPI. Denies N/V/F/Ch. ? ?Past Medical History:  ?Diagnosis Date  ? Arthritis   ? Asthma   ? Bipolar affective ([place])   ? Complication of anesthesia   ? has woken up during surgery before   ? Diabetes mellitus without complication ([place])   ? Headache(784.0)   ? Hypertension   ? Hypothyroidism   ? Pneumonia   ? PTSD (post-traumatic stress disorder)   ? Sleep apnea   ? CPAP  ? Stroke [facility])   ? "light" stroke  ? ? ?Current Outpatient Medications:  ?  amLODipine (NORVASC) 5 MG tablet, Take 5 mg by mouth daily., Disp: , Rfl:  ?  aspirin 81 MG EC tablet, Take 81 mg by mouth daily., Disp: , Rfl:  ?  b complex vitamins tablet, Take 1 tablet by mouth daily., Disp: , Rfl:  ?  BREO ELLIPTA 100-25 MCG/INH AEPB, Inhale 1 puff into the lungs daily., Disp: , Rfl: 3 ?  divalproex (DEPAKOTE) 500 MG DR tablet, Take 1 tablet (500 mg total) by mouth 2 (two) times daily for 7 days., Disp: 60 tablet, Rfl: 0 ?  doxycycline (VIBRA-TABS) 100 MG tablet, Take 1 tablet (100 mg total) by mouth 2 (two) times daily., Disp: 20 tablet, Rfl: 0 ?  FLUoxetine (PROZAC) 20 MG capsule, Take 3 capsules (60 mg total) by mouth daily., Disp: 90 capsule, Rfl: 0 ?  fluticasone (FLONASE) 50 MCG/ACT nasal spray, Place 2 sprays into both nostrils 3 (three) times daily as needed for allergies., Disp: , Rfl: 5 ?  folic acid (FOLVITE) 1 MG tablet, Take 1 tablet (1 mg total) by mouth daily., Disp: , Rfl:   ?  gabapentin (NEURONTIN) 400 MG capsule, Take 1 capsule (400 mg total) by mouth every 8 (eight) hours., Disp: 90 capsule, Rfl: 0 ?  ibuprofen (ADVIL) 800 MG tablet, Take 1 tablet (800 mg total) by mouth every 8 (eight) hours as needed for fever, headache, moderate pain or mild pain., Disp: 30 tablet, Rfl: 0 ?  levothyroxine (SYNTHROID, LEVOTHROID) 175 MCG tablet, Take 1 tablet (175 mcg total) by mouth daily., Disp: 30 tablet, Rfl: 0 ?  losartan (COZAAR) 100 MG tablet, Take 100 mg by mouth daily., Disp: , Rfl:  ?  metFORMIN (GLUCOPHAGE) 1000 MG tablet, Take 1,000 mg by mouth 2 (two) times daily., Disp: , Rfl:  ?  methocarbamol (ROBAXIN) 750 MG tablet, Take 750 mg by mouth every 6 (six) hours as needed., Disp: , Rfl:  ?  MOVANTIK 25 MG TABS tablet, Take 25 mg by mouth daily., Disp: , Rfl: 0 ?  oxyCODONE-acetaminophen (PERCOCET) 5-325 MG tablet, Take 1 tablet by mouth every 4 (four) hours as needed for severe pain., Disp: 30 tablet, Rfl: 0 ?  rosuvastatin (CRESTOR) 5 MG tablet, Take 5 mg by mouth at bedtime., Disp: , Rfl:  ?  temazepam (RESTORIL) 7.5 MG capsule, Take 7.5  mg by mouth at bedtime as needed for sleep., Disp: , Rfl:  ?  thiamine 100 MG tablet, Take 1 tablet (100 mg total) by mouth daily., Disp: , Rfl:  ? ?Social History  ? ?Tobacco Use  ?Smoking Status Every Day  ? Packs/day: 0.50  ? Types: Cigarettes  ?Smokeless Tobacco Former  ? Types: Chew  ? ? ?Allergies  ?Allergen Reactions  ? Carbamazepine Hives  ? Adhesive [Tape] Rash  ? Sulfa Antibiotics Rash  ? Sulfamethoxazole Rash  ? ?Objective:  ? ?Vitals:  ? 11/21/21 1402  ?BP: (!) 90/53  ?Pulse: 88  ?Temp: 97.9 ?F (36.6 ?C)  ? ?There is no height or weight on file to calculate BMI. ?Constitutional Well developed. ?Well nourished.  ?Vascular Foot warm and well perfused. ?Capillary refill normal to all digits.   ?Neurologic Normal speech. ?Oriented to person, place, and time. ?Epicritic sensation to light touch grossly present bilaterally.  ?Dermatologic Skin  healing well without signs of infection. Skin edges well coapted without signs of infection.  ?Orthopedic: Tenderness to palpation noted about the surgical site.  ? ?Radiographs: 3 views of skeletally mature adult left foot: Resection of the fourth metatarsal head noted.  No osteomyelitic changes noted.  No soft tissue emphysema noted ?Assessment:  ? ?1. Ulcer of left foot with fat layer exposed ([place])   ?2. Diabetes mellitus due to underlying condition with diabetic autonomic neuropathy, unspecified whether long term insulin use ([place])   ? ?Plan:  ?Patient was evaluated and treated and all questions answered. ? ?S/p foot surgery left ?-Progressing as expected post-operatively. ?-XR: See above ?-WB Status: Weightbearing as tolerated in cam boot ?-Sutures/staples: Intact.  No clinical signs of [place] is noted no complication noted ?-Medications: None ?-Foot redressed. ? ?Left plantar submetatarsal 4 ulceration ?-The ulcer appears to be improving.  Continue Betadine wet-to-dry dressing ? ?No follow-ups on file.  ?

## 2021-11-26 DIAGNOSIS — E782 Mixed hyperlipidemia: Secondary | ICD-10-CM | POA: Diagnosis not present

## 2021-11-26 DIAGNOSIS — I2510 Atherosclerotic heart disease of native coronary artery without angina pectoris: Secondary | ICD-10-CM | POA: Diagnosis not present

## 2021-11-26 DIAGNOSIS — R0789 Other chest pain: Secondary | ICD-10-CM | POA: Diagnosis not present

## 2021-11-26 DIAGNOSIS — I499 Cardiac arrhythmia, unspecified: Secondary | ICD-10-CM | POA: Diagnosis not present

## 2021-11-29 ENCOUNTER — Ambulatory Visit (INDEPENDENT_AMBULATORY_CARE_PROVIDER_SITE_OTHER): Payer: BC Managed Care – PPO | Admitting: Podiatry

## 2021-11-29 DIAGNOSIS — L97522 Non-pressure chronic ulcer of other part of left foot with fat layer exposed: Secondary | ICD-10-CM | POA: Diagnosis not present

## 2021-11-29 DIAGNOSIS — E0843 Diabetes mellitus due to underlying condition with diabetic autonomic (poly)neuropathy: Secondary | ICD-10-CM

## 2021-11-29 DIAGNOSIS — Z9889 Other specified postprocedural states: Secondary | ICD-10-CM

## 2021-11-29 NOTE — Progress Notes (Signed)
? ?  Subjective:  ?Patient presents today status post second metatarsal head resection with debridement of ulcer left foot. DOS: 11/14/2021.  Patient states that he is doing well.  He has been weightbearing in the cam boot as instructed.  No new complaints at this time ? ?Past Medical History:  ?Diagnosis Date  ? Arthritis   ? Asthma   ? Bipolar affective ([place])   ? Complication of anesthesia   ? has woken up during surgery before   ? Diabetes mellitus without complication ([place])   ? Headache(784.0)   ? Hypertension   ? Hypothyroidism   ? Pneumonia   ? PTSD (post-traumatic stress disorder)   ? Sleep apnea   ? CPAP  ? Stroke [facility])   ? "light" stroke  ? ?  ? ?Objective/Physical Exam ?Neurovascular status intact.  Skin incisions appear to be well coapted with staples intact. No sign of infectious process noted. No dehiscence. No active bleeding noted. Moderate edema noted to the surgical extremity. ? ?Ulcer noted to the plantar aspect of the left foot measuring approximately 1.0 x 0.7 x 0.3 cm.  To the noted ulceration there is no eschar.  There is a moderate amount of slough fibrin and necrotic tissue noted.  There is no exposed bone muscle tendon ligament or joint.  No malodor.  Minimal serous drainage.  Periwound is callused. ? ?Assessment: ?1. s/p second metatarsal head resection with debridement of ulcer left foot. DOS: 11/29/2021 ? ? ?Plan of Care:  ?1. Patient was evaluated.  ?2.  Staples removed ?3.  Medically necessary excisional debridement including subcutaneous tissue was performed using a tissue nipper to the plantar foot wound.  Excisional debridement of the necrotic nonviable tissue down to healthier bleeding viable tissue was performed with postdebridement measurement same as pre- ?4.  Dressings applied.  Dressing changes were provided for the patient ?5.  Return to clinic 2 weeks ? ?Edrick Kins, DPM ?[place] ? ?Dr. Edrick Kins, DPM  ?  ?[address]                                     ?[place], [place] [address]                ?Office [phone]  ?Fax [phone] ? ? ? ? ? ?

## 2021-12-03 ENCOUNTER — Encounter: Payer: BC Managed Care – PPO | Admitting: Podiatry

## 2021-12-12 ENCOUNTER — Other Ambulatory Visit: Payer: Self-pay

## 2021-12-12 DIAGNOSIS — F4310 Post-traumatic stress disorder, unspecified: Secondary | ICD-10-CM | POA: Insufficient documentation

## 2021-12-12 DIAGNOSIS — K760 Fatty (change of) liver, not elsewhere classified: Secondary | ICD-10-CM | POA: Insufficient documentation

## 2021-12-12 DIAGNOSIS — T8859XA Other complications of anesthesia, initial encounter: Secondary | ICD-10-CM | POA: Insufficient documentation

## 2021-12-12 DIAGNOSIS — E1169 Type 2 diabetes mellitus with other specified complication: Secondary | ICD-10-CM | POA: Insufficient documentation

## 2021-12-12 DIAGNOSIS — F319 Bipolar disorder, unspecified: Secondary | ICD-10-CM | POA: Insufficient documentation

## 2021-12-12 DIAGNOSIS — R911 Solitary pulmonary nodule: Secondary | ICD-10-CM | POA: Insufficient documentation

## 2021-12-12 DIAGNOSIS — R0789 Other chest pain: Secondary | ICD-10-CM | POA: Insufficient documentation

## 2021-12-12 DIAGNOSIS — E119 Type 2 diabetes mellitus without complications: Secondary | ICD-10-CM | POA: Insufficient documentation

## 2021-12-12 DIAGNOSIS — I499 Cardiac arrhythmia, unspecified: Secondary | ICD-10-CM | POA: Insufficient documentation

## 2021-12-12 DIAGNOSIS — K219 Gastro-esophageal reflux disease without esophagitis: Secondary | ICD-10-CM | POA: Insufficient documentation

## 2021-12-12 DIAGNOSIS — J45909 Unspecified asthma, uncomplicated: Secondary | ICD-10-CM | POA: Insufficient documentation

## 2021-12-12 DIAGNOSIS — D126 Benign neoplasm of colon, unspecified: Secondary | ICD-10-CM | POA: Insufficient documentation

## 2021-12-12 DIAGNOSIS — I639 Cerebral infarction, unspecified: Secondary | ICD-10-CM | POA: Insufficient documentation

## 2021-12-12 DIAGNOSIS — I2510 Atherosclerotic heart disease of native coronary artery without angina pectoris: Secondary | ICD-10-CM | POA: Insufficient documentation

## 2021-12-12 DIAGNOSIS — N529 Male erectile dysfunction, unspecified: Secondary | ICD-10-CM | POA: Insufficient documentation

## 2021-12-12 DIAGNOSIS — J189 Pneumonia, unspecified organism: Secondary | ICD-10-CM | POA: Insufficient documentation

## 2021-12-12 DIAGNOSIS — F908 Attention-deficit hyperactivity disorder, other type: Secondary | ICD-10-CM | POA: Insufficient documentation

## 2021-12-12 DIAGNOSIS — E1140 Type 2 diabetes mellitus with diabetic neuropathy, unspecified: Secondary | ICD-10-CM | POA: Insufficient documentation

## 2021-12-12 DIAGNOSIS — I700 Atherosclerosis of aorta: Secondary | ICD-10-CM | POA: Insufficient documentation

## 2021-12-12 DIAGNOSIS — E782 Mixed hyperlipidemia: Secondary | ICD-10-CM | POA: Insufficient documentation

## 2021-12-12 DIAGNOSIS — G894 Chronic pain syndrome: Secondary | ICD-10-CM | POA: Insufficient documentation

## 2021-12-12 DIAGNOSIS — J439 Emphysema, unspecified: Secondary | ICD-10-CM | POA: Insufficient documentation

## 2021-12-12 DIAGNOSIS — E039 Hypothyroidism, unspecified: Secondary | ICD-10-CM | POA: Insufficient documentation

## 2021-12-12 DIAGNOSIS — M1990 Unspecified osteoarthritis, unspecified site: Secondary | ICD-10-CM | POA: Insufficient documentation

## 2021-12-12 DIAGNOSIS — E785 Hyperlipidemia, unspecified: Secondary | ICD-10-CM | POA: Insufficient documentation

## 2021-12-13 ENCOUNTER — Ambulatory Visit (INDEPENDENT_AMBULATORY_CARE_PROVIDER_SITE_OTHER): Payer: BC Managed Care – PPO | Admitting: Podiatry

## 2021-12-13 ENCOUNTER — Encounter: Payer: Self-pay | Admitting: Podiatry

## 2021-12-13 VITALS — Temp 98.2°F

## 2021-12-13 DIAGNOSIS — Z9889 Other specified postprocedural states: Secondary | ICD-10-CM

## 2021-12-13 DIAGNOSIS — L97522 Non-pressure chronic ulcer of other part of left foot with fat layer exposed: Secondary | ICD-10-CM

## 2021-12-13 NOTE — Progress Notes (Signed)
? ?Subjective:  ?Patient presents today status post second metatarsal head resection with debridement of ulcer left foot. DOS: 11/14/2021.  Patient continues to improve.  He says he is feeling very well in regards to the foot.  He has been weightbearing in the cam boot and changing the dressings daily ?Past Medical History:  ?Diagnosis Date  ? Adenomatous colon polyp   ? ADHD, adult residual type   ? Anxiety 09/03/2012  ? Adequate for discharge  ? Aortic atherosclerosis ([place])   ? Arthritis   ? Asthma   ? Atypical chest pain   ? Bipolar affective ([place])   ? Bipolar depression ([place])   ? Bipolar II disorder ([place])   ? CAD (coronary artery disease)   ? Carpal tunnel syndrome of right wrist 10/21/2017  ? Cervical disc displacement 03/24/2017  ? Formatting of this note might be different from the original. CERVICAL 5-6, CERVICAL 6-7  ? Chronic pain syndrome   ? Complication of anesthesia   ? has woken up during surgery before   ? Depression 09/03/2012  ? Adequate for discharge  ? Diabetes mellitus without complication ([place])   ? Emphysema/COPD [facility])   ? Essential hypertension 10/09/2021  ? Blood pressure is picking up.  Hypotensive on arrival.  Resume amlodipine today.  May not need to multiple antihypertensives.  ? GERD (gastroesophageal reflux disease)   ? Hepatic steatosis   ? Hypomagnesemia 10/09/2021  ? Replace aggressively today.  Recheck levels tomorrow including phosphorus and potassium.  ? Hypothyroidism   ? Impotence, organic   ? Irregular heart beat   ? Lumbar radiculopathy, chronic   ? Mixed hyperlipidemia   ? MRSA (methicillin resistant staph aureus) culture positive 08/08/2016  ? Formatting of this note might be different from the original. UPDATE : MRSA RE-SWAB FOR NEXT SURGERY ALSO +MRSA ON 03-24-17  + MRSA NASAL SWAB ON 08-08-16  ? Nodule of lower lobe of right lung   ? Obstructive sleep apnea 10/09/2021  ? Start using CPAP at night.  Wean off oxygen.  ? Pneumonia   ? Postlaminectomy syndrome of lumbosacral region  08/08/2016  ? PTSD (post-traumatic stress disorder)   ? Recurrent major depressive disorder ([place]) 01/30/2018  ? Sciatica 08/08/2016  ? Shock ([place]) 10/07/2021  ? Slurred speech 01/14/2013  ? Spinal stenosis 08/08/2016  ? Stroke [facility])   ? "light" stroke  ? Type 2 diabetes mellitus with complication, without long-term current use of insulin ([place]) 10/09/2021  ? On metformin at home.  Fairly controlled as per patient.  Holding metformin.  Currently on sliding scale insulin.  Resume metformin on discharge.  ? Type 2 diabetes mellitus with diabetic neuropathy ([place])   ? Type 2 diabetes mellitus with hyperlipidemia ([place])   ? ?  ? ? ?Objective/Physical Exam ?Neurovascular status intact.  Skin incisions healed.  No dehiscence.  No erythema or edema noted. ? ?Ulcer noted to the plantar aspect of the left foot measuring approximately 0.7 x 0.2 x 0.3 cm.  Overall there is significant improvement of the ulcer.  To the noted ulceration there is no eschar.  There is a moderate amount of slough fibrin and necrotic tissue noted.  There is no exposed bone muscle tendon ligament or joint.  No malodor.  Minimal serous drainage.  Periwound is callused. ? ?Assessment: ?1. s/p second metatarsal head resection with debridement of ulcer left foot. DOS: 11/29/2021 ?2.  Ulcer left foot fat layer exposed secondary to diabetes mellitus ? ? ?Plan of Care:  ?1. Patient  was evaluated.  ?2.  Overall the patient is doing very well with good healing of the foot ?3.  Medically necessary excisional debridement including subcutaneous tissue was performed using a tissue nipper to the plantar foot wound.  Excisional debridement of the necrotic nonviable tissue down to healthier bleeding viable tissue was performed with postdebridement measurement same as pre- ?4.  Continue Betadine dry sterile dressings with Ace wrap ?5.  Patient may discontinue cam boot.  Postsurgical shoe dispensed.  Weightbearing as tolerated ?6.  Return to clinic in 3 weeks ? ?Edrick Kins,  DPM ?[place] ? ?Dr. Edrick Kins, DPM  ?  ?[address]                                    ?[place], [place] [address]                ?Office [phone]  ?Fax [phone] ? ? ? ? ? ?

## 2021-12-14 ENCOUNTER — Ambulatory Visit (HOSPITAL_COMMUNITY)
Admission: EM | Admit: 2021-12-14 | Discharge: 2021-12-14 | Disposition: A | Discharge status: Other Acute Inpatient Hospital | Payer: BC Managed Care – PPO

## 2021-12-14 ENCOUNTER — Emergency Department (HOSPITAL_COMMUNITY)
Admission: EM | Admit: 2021-12-14 | Discharge: 2021-12-17 | Disposition: A | Discharge status: Other Acute Inpatient Hospital | Payer: BC Managed Care – PPO | Attending: Emergency Medicine | Admitting: Emergency Medicine

## 2021-12-14 ENCOUNTER — Other Ambulatory Visit: Payer: Self-pay

## 2021-12-14 ENCOUNTER — Encounter (HOSPITAL_COMMUNITY): Payer: Self-pay | Admitting: Emergency Medicine

## 2021-12-14 DIAGNOSIS — L97429 Non-pressure chronic ulcer of left heel and midfoot with unspecified severity: Secondary | ICD-10-CM | POA: Diagnosis not present

## 2021-12-14 DIAGNOSIS — F1090 Alcohol use, unspecified, uncomplicated: Secondary | ICD-10-CM

## 2021-12-14 DIAGNOSIS — R45851 Suicidal ideations: Secondary | ICD-10-CM

## 2021-12-14 DIAGNOSIS — E119 Type 2 diabetes mellitus without complications: Secondary | ICD-10-CM | POA: Insufficient documentation

## 2021-12-14 DIAGNOSIS — F314 Bipolar disorder, current episode depressed, severe, without psychotic features: Secondary | ICD-10-CM | POA: Diagnosis not present

## 2021-12-14 DIAGNOSIS — J449 Chronic obstructive pulmonary disease, unspecified: Secondary | ICD-10-CM | POA: Insufficient documentation

## 2021-12-14 DIAGNOSIS — I2510 Atherosclerotic heart disease of native coronary artery without angina pectoris: Secondary | ICD-10-CM | POA: Insufficient documentation

## 2021-12-14 DIAGNOSIS — F10230 Alcohol dependence with withdrawal, uncomplicated: Secondary | ICD-10-CM | POA: Insufficient documentation

## 2021-12-14 DIAGNOSIS — Z7984 Long term (current) use of oral hypoglycemic drugs: Secondary | ICD-10-CM | POA: Diagnosis not present

## 2021-12-14 DIAGNOSIS — Z79899 Other long term (current) drug therapy: Secondary | ICD-10-CM | POA: Diagnosis not present

## 2021-12-14 DIAGNOSIS — F1010 Alcohol abuse, uncomplicated: Secondary | ICD-10-CM | POA: Diagnosis not present

## 2021-12-14 DIAGNOSIS — Z7982 Long term (current) use of aspirin: Secondary | ICD-10-CM | POA: Insufficient documentation

## 2021-12-14 DIAGNOSIS — Z20822 Contact with and (suspected) exposure to covid-19: Secondary | ICD-10-CM | POA: Diagnosis not present

## 2021-12-14 DIAGNOSIS — R9431 Abnormal electrocardiogram [ECG] [EKG]: Secondary | ICD-10-CM | POA: Diagnosis not present

## 2021-12-14 DIAGNOSIS — F10939 Alcohol use, unspecified with withdrawal, unspecified: Secondary | ICD-10-CM

## 2021-12-14 LAB — COMPREHENSIVE METABOLIC PANEL
ALT: 30 U/L (ref 0–44)
AST: 32 U/L (ref 15–41)
Albumin: 3.4 g/dL — ABNORMAL LOW (ref 3.5–5.0)
Alkaline Phosphatase: 75 U/L (ref 38–126)
Anion gap: 13 (ref 5–15)
BUN: 12 mg/dL (ref 8–23)
CO2: 22 mmol/L (ref 22–32)
Calcium: 9.1 mg/dL (ref 8.9–10.3)
Chloride: 101 mmol/L (ref 98–111)
Creatinine, Ser: 0.9 mg/dL (ref 0.61–1.24)
GFR, Estimated: >60 mL/min (ref >60)
Glucose, Bld: 104 mg/dL — ABNORMAL HIGH (ref 70–99)
Potassium: 4.2 mmol/L (ref 3.5–5.1)
Sodium: 136 mmol/L (ref 135–145)
Total Bilirubin: 0.6 mg/dL (ref 0.3–1.2)
Total Protein: 6.9 g/dL (ref 6.5–8.1)

## 2021-12-14 LAB — SALICYLATE LEVEL: Salicylate Lvl: <7 mg/dL — ABNORMAL LOW (ref 7.0–30.0)

## 2021-12-14 LAB — CBC
HCT: 48.1 % (ref 39.0–52.0)
Hemoglobin: 16 g/dL (ref 13.0–17.0)
MCH: 31.4 pg (ref 26.0–34.0)
MCHC: 33.3 g/dL (ref 30.0–36.0)
MCV: 94.3 fL (ref 80.0–100.0)
Platelets: 226 10*3/uL (ref 150–400)
RBC: 5.1 MIL/uL (ref 4.22–5.81)
RDW: 14.6 % (ref 11.5–15.5)
WBC: 8.3 10*3/uL (ref 4.0–10.5)
nRBC: 0 % (ref 0.0–0.2)

## 2021-12-14 LAB — RAPID URINE DRUG SCREEN, HOSP PERFORMED
Amphetamines: NOT DETECTED
Barbiturates: NOT DETECTED
Benzodiazepines: NOT DETECTED
Cocaine: NOT DETECTED
Opiates: NOT DETECTED
Tetrahydrocannabinol: NOT DETECTED

## 2021-12-14 LAB — RESP PANEL BY RT-PCR (FLU A&B, COVID) ARPGX2
Influenza A by PCR: NEGATIVE
Influenza B by PCR: NEGATIVE
SARS Coronavirus 2 by RT PCR: NEGATIVE

## 2021-12-14 LAB — ETHANOL: Alcohol, Ethyl (B): 125 mg/dL — ABNORMAL HIGH (ref <10)

## 2021-12-14 LAB — ACETAMINOPHEN LEVEL: Acetaminophen (Tylenol), Serum: <10 ug/mL — ABNORMAL LOW (ref 10–30)

## 2021-12-14 MED ORDER — ADULT MULTIVITAMIN W/MINERALS CH: 1.0000 | ORAL_TABLET | Freq: Every day | ORAL | Status: DC

## 2021-12-14 MED ORDER — THIAMINE HCL 100 MG PO TABS: 100.0000 mg | ORAL_TABLET | Freq: Every day | ORAL | Status: DC

## 2021-12-14 MED ORDER — LORAZEPAM 1 MG PO TABS
0.0000 mg | ORAL_TABLET | Freq: Two times a day (BID) | ORAL | Status: DC
  Administered 2021-12-16: 2 mg via ORAL
  Filled 2021-12-14: qty 2

## 2021-12-14 MED ORDER — LORAZEPAM 2 MG/ML IJ SOLN: 1.0000 mg | INTRAMUSCULAR | Status: DC | PRN

## 2021-12-14 MED ORDER — LORAZEPAM 1 MG PO TABS: 1.0000 mg | ORAL_TABLET | ORAL | Status: DC | PRN

## 2021-12-14 MED ORDER — SERTRALINE HCL 25 MG PO TABS
25.0000 mg | ORAL_TABLET | Freq: Every day | ORAL | Status: DC
Start: 2021-12-14 — End: 2021-12-17
  Administered 2021-12-14 – 2021-12-17 (×4): 25 mg via ORAL
  Filled 2021-12-14 (×4): qty 1

## 2021-12-14 MED ORDER — AMLODIPINE BESYLATE 5 MG PO TABS
5.0000 mg | ORAL_TABLET | Freq: Every day | ORAL | Status: DC
  Administered 2021-12-14 – 2021-12-17 (×4): 5 mg via ORAL
  Filled 2021-12-14 (×4): qty 1

## 2021-12-14 MED ORDER — ASPIRIN EC 81 MG PO TBEC
81.0000 mg | DELAYED_RELEASE_TABLET | Freq: Every day | ORAL | Status: DC
Start: 2021-12-14 — End: 2021-12-17
  Administered 2021-12-14 – 2021-12-17 (×4): 81 mg via ORAL
  Filled 2021-12-14 (×4): qty 1

## 2021-12-14 MED ORDER — THIAMINE HCL 100 MG/ML IJ SOLN
100.0000 mg | Freq: Every day | INTRAMUSCULAR | Status: DC
  Filled 2021-12-14: qty 2

## 2021-12-14 MED ORDER — LEVOTHYROXINE SODIUM 75 MCG PO TABS
175.0000 ug | ORAL_TABLET | Freq: Every day | ORAL | Status: DC
  Administered 2021-12-15 – 2021-12-17 (×3): 175 ug via ORAL
  Filled 2021-12-14 (×3): qty 1

## 2021-12-14 MED ORDER — QUETIAPINE FUMARATE 200 MG PO TABS
600.0000 mg | ORAL_TABLET | Freq: Every day | ORAL | Status: DC
  Administered 2021-12-14 – 2021-12-16 (×3): 600 mg via ORAL
  Filled 2021-12-14: qty 2
  Filled 2021-12-14 (×2): qty 3

## 2021-12-14 MED ORDER — THIAMINE HCL 100 MG PO TABS
100.0000 mg | ORAL_TABLET | Freq: Every day | ORAL | Status: DC
  Administered 2021-12-14 – 2021-12-17 (×4): 100 mg via ORAL
  Filled 2021-12-14 (×4): qty 1

## 2021-12-14 MED ORDER — LORAZEPAM 1 MG PO TABS
0.0000 mg | ORAL_TABLET | Freq: Four times a day (QID) | ORAL | Status: AC
  Administered 2021-12-14: 2 mg via ORAL
  Administered 2021-12-15 (×4): 1 mg via ORAL
  Filled 2021-12-14 (×3): qty 1
  Filled 2021-12-14: qty 2
  Filled 2021-12-14: qty 1

## 2021-12-14 MED ORDER — DIVALPROEX SODIUM ER 500 MG PO TB24
1000.0000 mg | ORAL_TABLET | Freq: Every day | ORAL | Status: DC
Start: 2021-12-14 — End: 2021-12-17
  Administered 2021-12-14 – 2021-12-16 (×3): 1000 mg via ORAL
  Filled 2021-12-14 (×3): qty 2

## 2021-12-14 MED ORDER — LORAZEPAM 2 MG/ML IJ SOLN: 0.0000 mg | Freq: Two times a day (BID) | INTRAMUSCULAR | Status: DC

## 2021-12-14 MED ORDER — ROSUVASTATIN CALCIUM 5 MG PO TABS
5.0000 mg | ORAL_TABLET | Freq: Every day | ORAL | Status: DC
Start: 2021-12-14 — End: 2021-12-17
  Administered 2021-12-14 – 2021-12-16 (×3): 5 mg via ORAL
  Filled 2021-12-14 (×3): qty 1

## 2021-12-14 MED ORDER — FOLIC ACID 1 MG PO TABS: 1.0000 mg | ORAL_TABLET | Freq: Every day | ORAL | Status: DC

## 2021-12-14 MED ORDER — THIAMINE HCL 100 MG/ML IJ SOLN: 100.0000 mg | Freq: Every day | INTRAMUSCULAR | Status: DC

## 2021-12-14 MED ORDER — ALBUTEROL SULFATE HFA 108 (90 BASE) MCG/ACT IN AERS
2.0000 | INHALATION_SPRAY | Freq: Four times a day (QID) | RESPIRATORY_TRACT | Status: DC | PRN
  Administered 2021-12-16: 2 via RESPIRATORY_TRACT
  Filled 2021-12-14: qty 6.7

## 2021-12-14 MED ORDER — METFORMIN HCL 500 MG PO TABS: 1000.0000 mg | ORAL_TABLET | Freq: Two times a day (BID) | ORAL | Status: DC

## 2021-12-14 MED ORDER — LEVOTHYROXINE SODIUM 75 MCG PO TABS
175.0000 ug | ORAL_TABLET | Freq: Every day | ORAL | Status: DC
  Filled 2021-12-14: qty 1

## 2021-12-14 MED ORDER — LOSARTAN POTASSIUM 50 MG PO TABS
100.0000 mg | ORAL_TABLET | Freq: Every day | ORAL | Status: DC
  Administered 2021-12-14 – 2021-12-17 (×4): 100 mg via ORAL
  Filled 2021-12-14 (×4): qty 2

## 2021-12-14 MED ORDER — LORAZEPAM 2 MG/ML IJ SOLN: 0.0000 mg | Freq: Four times a day (QID) | INTRAMUSCULAR | Status: AC

## 2021-12-14 MED ORDER — METFORMIN HCL 500 MG PO TABS
1000.0000 mg | ORAL_TABLET | Freq: Two times a day (BID) | ORAL | Status: DC
  Administered 2021-12-15 – 2021-12-17 (×5): 1000 mg via ORAL
  Filled 2021-12-14 (×5): qty 2
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
## 2021-12-14 NOTE — BH Assessment (Signed)
Comprehensive Clinical Assessment (CCA) Note ? ?12/14/2021 ?Rodney Leach ?[id number] ? ?DISPOSITION: Chana Bode NP recommends an inpatient admission. Patient will be transferred to [facility] for medical clearance and await bed placement.   ? ?[facility] ED from 12/14/2021 in [facility] ED to Hosp-Admission (Discharged) from 10/07/2021 in [facility]  ?C-SSRS RISK CATEGORY High Risk No Risk  ? ?  ? The patient demonstrates the following risk factors for suicide: Chronic risk factors for suicide include: substance use disorder. Acute risk factors for suicide include: social withdrawal/isolation. Protective factors for this patient include: coping skills. Considering these factors, the overall suicide risk at this point appears to be high. Patient is not appropriate for outpatient follow up.  ? ?Patient is a 61 year old male that presents as a voluntary walk in to [facility] requesting assistance with ongoing alcohol issues and S/I. Patient reports multiple plans to self harm to include overdosing on his medications or cutting his wrists. Patient also voices H/I towards "a couple people" although denies intent and is vague in reference to who he intends to harm. Patient denies any prior attempts or gestures. Patient has a history significant for MDD and GAD. Patient states he currently receives medication management from [place] MD who assists with medication management for on going depression. Patient reports current compliance (See MAR) and denies having a therapist or counseling to assist with ongoing issues. Patient reports ongoing alcohol issues to include patient reporting that he has been consuming one fifth of liquor daily for the last 8 months with last use prior to arrival when patient reported he drank "close to a pint." Patient denies any other SA issues. Patient reports current withdrawals to include: nausea and "feeling shaking."      ? ?Per chart review  patient was admitted to [facility] for medical issues in March of this year. He was evaluated by psychiatry at that time and was recommended for inpatient psychiatric admission. Patient had been admitted to [facility] however he tested positive for COVID on 10/10/2021 and his admission was canceled. He was discharged home with a 7-day sample of Depakote per notes.   ?  ?Patient is alert and oriented x 5. Patient is observed to be somewhat agitated although is easily redirected. Patient's memory appears to be intact with thoughts organized. Patient's mood is irritable with affect congruent. Patient does not appear to be responding to internal stimuli.       ? ? ?Chief Complaint:  ?Chief Complaint  ?Patient presents with  ? Suicidal  ? Alcohol Problem  ? ?Visit Diagnosis: MDD recurrent without psychotic features, severe, Alcohol abuse   ? ? ?CCA Screening, Triage and Referral (STR) ? ?Patient Reported Information ?How did you hear about [place]? Self ? ?What Is the Reason for Your Visit/Call Today? Pt presents with ongoing S/I and ETOH issues ? ?How Long Has This Been Causing You Problems? > than 6 months ? ?What Do You Feel Would Help You the Most Today? Alcohol or Drug Use Treatment ? ? ?Have You Recently Had Any Thoughts About Hurting Yourself? Yes ? ?Are You Planning to Commit Suicide/Harm Yourself At This time? No ? ? ?Have you Recently Had Thoughts About [place]? No ? ?Are You Planning to Harm Someone at This Time? No ? ?Explanation: No data recorded ? ?Have You Used Any Alcohol or Drugs in the Past 24 Hours? Yes ? ?How Long Ago Did You Use Drugs or  Alcohol? No data recorded ?What Did You Use and How Much? Pt reports he consumes one fifth of liquor daily with last use prior to arrival when pt reports he "drank about a pint" ? ? ?Do You Currently Have a Therapist/Psychiatrist? Yes ? ?Name of Therapist/Psychiatrist: Izzy MD ? ? ?Have You Been Recently Discharged From Any Office Practice or Programs?  No ? ?Explanation of Discharge From Practice/Program: No data recorded ? ?  ?CCA Screening Triage Referral Assessment ?Type of Contact: Face-to-Face ? ?Telemedicine Service Delivery:   ?Is this Initial or Reassessment? No data recorded ?Date Telepsych consult ordered in [facility]:  No data recorded ?Time Telepsych consult ordered in CHL:  No data recorded ?Location of Assessment: [facility] Assessment Services ? ?Provider Location: [facility] Assessment Services ? ? ?Collateral Involvement: Wife who is present ? ? ?Does Patient Have a Stage manager Guardian? No data recorded ?Name and Contact of Legal Guardian: No data recorded ?If Minor and Not Living with Parent(s), Who has Custody? NA ? ?Is CPS involved or ever been involved? Never ? ?Is APS involved or ever been involved? Never ? ? ?Patient Determined To Be At Risk for Harm To Self or Others Based on Review of Patient Reported Information or Presenting Complaint? Yes, for Self-Harm ? ?Method: No data recorded ?Availability of Means: No data recorded ?Intent: No data recorded ?Notification Required: No data recorded ?Additional Information for Danger to Others Potential: No data recorded ?Additional Comments for Danger to Others Potential: No data recorded ?Are There Guns or Other Weapons in [place]? No data recorded ?Types of Guns/Weapons: No data recorded ?Are These Weapons Safely Secured?                            No data recorded ?Who Could Verify You Are Able To Have These Secured: No data recorded ?Do You Have any Outstanding Charges, Pending Court Dates, Parole/Probation? No data recorded ?Contacted To Inform of Risk of Harm To Self or Others: Other: Comment (NA) ? ? ? ?Does Patient Present under Involuntary Commitment? No ? ?IVC Papers Initial File Date: No data recorded ? ?[place] of Residence: [place] Arg[place] ? ? ?Patient Currently Receiving the Following Services: Medication Management ? ? ?Determination of Need: Emergent (2 hours) ? ? ?Options For Referral:  Inpatient Hospitalization ? ? ? ? ?CCA Biopsychosocial ?Patient Reported Schizophrenia/Schizoaffective Diagnosis in Past: No ? ? ?Strengths: Pt is willing to participate in treatment ? ? ?Mental Health Symptoms ?Depression:   ?Change in energy/activity; Fatigue; Hopelessness ?  ?Duration of Depressive symptoms:  ?Duration of Depressive Symptoms: Greater than two weeks ?  ?Mania:   ?Change in energy/activity; Irritability; Racing thoughts ?  ?Anxiety:    ?Difficulty concentrating; Irritability; Restlessness ?  ?Psychosis:   ?None ?  ?Duration of Psychotic symptoms:    ?Trauma:   ?None ?  ?Obsessions:   ?None ?  ?Compulsions:   ?None ?  ?Inattention:   ?None ?  ?Hyperactivity/Impulsivity:   ?None ?  ?Oppositional/Defiant Behaviors:   ?None ?  ?Emotional Irregularity:   ?Chronic feelings of emptiness; Intense/inappropriate anger; Mood lability ?  ?Other Mood/Personality Symptoms:   ?NA ?  ? ?Mental Status Exam ?Appearance and self-care  ?Stature:   ?Average ?  ?Weight:   ?Obese ?  ?Clothing:   ?Disheveled ?  ?Grooming:   ?Neglected ?  ?Cosmetic use:   ?None ?  ?Posture/gait:   ?Normal ?  ?Motor activity:   ?Agitated ?  ?  Sensorium  ?Attention:   ?Distractible ?  ?Concentration:   ?Normal ?  ?Orientation:   ?X5 ?  ?Recall/memory:   ?Normal ?  ?Affect and Mood  ?Affect:   ?Anxious ?  ?Mood:   ?Anxious; Depressed ?  ?Relating  ?Eye contact:   ?Normal ?  ?Facial expression:   ?Anxious ?  ?Attitude toward examiner:   ?Cooperative ?  ?Thought and Language  ?Speech flow:  ?Clear and Coherent ?  ?Thought content:   ?Appropriate to Mood and Circumstances ?  ?Preoccupation:   ?None ?  ?Hallucinations:   ?None ?  ?Organization:  No data recorded  ?Executive Functions  ?Fund of Knowledge:   ?[place] ?  ?Intelligence:   ?Average ?  ?Abstraction:   ?Normal ?  ?Judgement:   ?Fair ?  ?Reality Testing:   ?Realistic ?  ?Insight:   ?Fair ?  ?Decision Making:   ?Normal ?  ?Social Functioning  ?Social Maturity:   ?Responsible ?  ?Social  Judgement:   ?Normal ?  ?Stress  ?Stressors:   ?Other (Comment) (Ongoing SA issues) ?  ?Coping Ability:   ?Overwhelmed ?  ?Skill Deficits:   ?Activities of daily living ?  ?Supports:   ?Usual ?  ? ? ?Religion: ?R

## 2021-12-14 NOTE — ED Notes (Signed)
Report called to Delia Chimes at [facility]. [employer] contacted for transportation services to [employer]. EMTALA printed and placed in an envelope to give to driver at time of departure.Safety maintained and will continue to monitor.  ?

## 2021-12-14 NOTE — ED Provider Notes (Signed)
?Maish [facility] ?Provider Note ? ? ?CSN: [id number] ?Arrival date & time: 12/14/21  1829 ? ?  ? ?History ? ?Chief Complaint  ?Patient presents with  ? Suicidal  ? Alcohol Problem  ? ? ?Rodney Leach is a 61 y.o. male. ? ?HPI ?Patient is a 61 year old male with a history of bipolar disorder, depression, anxiety, PTSD, type 2 diabetes mellitus, CAD, COPD, who presents to the emergency department from behavioral health urgent care due to SI/HI.  Patient states that he drinks alcohol regularly and typically drinks about 1/5 of liquor per day.  States he was sober for many years but relapsed about 1 month ago.  He has been drinking throughout the day today and stopped around 4 PM just prior to going to the behavioral health urgent care.  He endorses worsening suicidal ideation.  He noted at the behavioral with urgent care that he had a plan to cut his wrists or overdose on medication.  He also noted "I could always she myself in the head".  Also notes that he has thought about hurting to other people but does not provide any other details.  Due to his medical history he was sent from behavioral with urgent care for further management until he can be placed inpatient. ? ?Patient notes to me that he has a well-healing ulcer noted to the plantar aspect of the left foot that has been following with wound as well as podiatry for but otherwise denies any somatic complaints.  Denies a history of seizures or DTs in the past.   ?  ? ?Home Medications ?Prior to Admission medications   ?Medication Sig Start Date End Date Taking? Authorizing Provider  ?albuterol (VENTOLIN HFA) 108 (90 Base) MCG/ACT inhaler Inhale 2 puffs into the lungs every 6 (six) hours as needed for shortness of breath.    [provider]  ?amLODipine (NORVASC) 5 MG tablet Take 5 mg by mouth daily.    [provider]  ?aspirin 81 MG EC tablet Take 81 mg by mouth daily.    [provider]  ?b complex  vitamins tablet Take 1 tablet by mouth daily.    [provider]  ?divalproex (DEPAKOTE ER) 500 MG 24 hr tablet Take 1,000 mg by mouth at bedtime. 11/25/21   [provider]  ?levothyroxine (SYNTHROID, LEVOTHROID) 175 MCG tablet Take 1 tablet (175 mcg total) by mouth daily. 09/16/12   Patrecia Pour, NP  ?losartan (COZAAR) 100 MG tablet Take 100 mg by mouth daily. 04/04/19   [provider]  ?metFORMIN (GLUCOPHAGE) 1000 MG tablet Take 1,000 mg by mouth 2 (two) times daily. 09/03/21   [provider]  ?QUEtiapine (SEROQUEL) 300 MG tablet Take 600 mg by mouth at bedtime. 11/13/21   [provider]  ?rosuvastatin (CRESTOR) 5 MG tablet Take 5 mg by mouth at bedtime. 07/10/21   [provider]  ?sertraline (ZOLOFT) 25 MG tablet Take 25 mg by mouth daily. 11/30/21   [provider]  ?thiamine 100 MG tablet Take 1 tablet (100 mg total) by mouth daily. 10/11/21   Barb Merino, MD  ?   ? ?Allergies    ?Carbamazepine, Adhesive [tape], Sulfa antibiotics, and Sulfamethoxazole   ? ?Review of Systems   ?Review of Systems  ?All other systems reviewed and are negative. ?Ten systems reviewed and are negative for acute change, except as noted in the HPI.   ?Physical Exam ?Updated Vital Signs ?BP 128/69   Pulse 88  Temp 98.3 ?F (36.8 ?C) (Oral)   Resp 16   SpO2 94%  ?Physical Exam ?Vitals and nursing note reviewed.  ?Constitutional:   ?   General: He is not in acute distress. ?   Appearance: Normal appearance. He is not ill-appearing, toxic-appearing or diaphoretic.  ?HENT:  ?   Head: Normocephalic and atraumatic.  ?   Right Ear: External ear normal.  ?   Left Ear: External ear normal.  ?   Nose: Nose normal.  ?   Mouth/Throat:  ?   Mouth: Mucous membranes are moist.  ?   Pharynx: Oropharynx is clear. No oropharyngeal exudate or posterior oropharyngeal erythema.  ?Eyes:  ?   Extraocular Movements: Extraocular movements intact.  ?Cardiovascular:  ?   Rate and Rhythm: Normal  rate and regular rhythm.  ?   Pulses: Normal pulses.  ?   Heart sounds: Normal heart sounds. No murmur heard. ?  No friction rub. No gallop.  ?Pulmonary:  ?   Effort: Pulmonary effort is normal. No respiratory distress.  ?   Breath sounds: Normal breath sounds. No stridor. No wheezing, rhonchi or rales.  ?Abdominal:  ?   General: Abdomen is flat.  ?   Tenderness: There is no abdominal tenderness.  ?Musculoskeletal:     ?   General: Normal range of motion.  ?   Cervical back: Normal range of motion and neck supple. No tenderness.  ?Skin: ?   General: Skin is warm and dry.  ?   Comments: Well-healing stage I-II ulceration noted to the plantar aspect of the left foot.  No surrounding erythema.  No drainage.  No tenderness noted at the site.  ?Neurological:  ?   General: No focal deficit present.  ?   Mental Status: He is alert and oriented to person, place, and time.  ?   Comments: No tremors noted.  ?Psychiatric:     ?   Mood and Affect: Mood normal.     ?   Behavior: Behavior normal.  ? ?ED Results / Procedures / Treatments   ?Labs ?(all labs ordered are listed, but only abnormal results are displayed) ?Labs Reviewed  ?COMPREHENSIVE METABOLIC PANEL - Abnormal; Notable for the following components:  ?    Result Value  ? Glucose, Bld 104 (*)   ? Albumin 3.4 (*)   ? All other components within normal limits  ?ETHANOL - Abnormal; Notable for the following components:  ? Alcohol, Ethyl (B) 125 (*)   ? All other components within normal limits  ?SALICYLATE LEVEL - Abnormal; Notable for the following components:  ? Salicylate Lvl <5.0 (*)   ? All other components within normal limits  ?ACETAMINOPHEN LEVEL - Abnormal; Notable for the following components:  ? Acetaminophen (Tylenol), Serum <10 (*)   ? All other components within normal limits  ?RESP PANEL BY RT-PCR (FLU A&B, COVID) ARPGX2  ?CBC  ?RAPID URINE DRUG SCREEN, HOSP PERFORMED  ? ? ?EKG ?None ? ?Radiology ?No results found. ? ?Procedures ?Procedures  ? ?Medications  Ordered in ED ?Medications  ?LORazepam (ATIVAN) injection 0-4 mg ( Intravenous See Alternative 12/14/21 1942)  ?  Or  ?LORazepam (ATIVAN) tablet 0-4 mg (2 mg Oral Given 12/14/21 1942)  ?LORazepam (ATIVAN) injection 0-4 mg (has no administration in time range)  ?  Or  ?LORazepam (ATIVAN) tablet 0-4 mg (has no administration in time range)  ?thiamine tablet 100 mg (100 mg Oral Given 12/14/21 1941)  ?  Or  ?thiamine (B-1) injection 100  mg ( Intravenous See Alternative 12/14/21 1941)  ?albuterol (VENTOLIN HFA) 108 (90 Base) MCG/ACT inhaler 2 puff (has no administration in time range)  ?amLODipine (NORVASC) tablet 5 mg (has no administration in time range)  ?aspirin EC tablet 81 mg (has no administration in time range)  ?divalproex (DEPAKOTE ER) 24 hr tablet 1,000 mg (has no administration in time range)  ?levothyroxine (SYNTHROID) tablet 175 mcg (has no administration in time range)  ?losartan (COZAAR) tablet 100 mg (has no administration in time range)  ?metFORMIN (GLUCOPHAGE) tablet 1,000 mg (has no administration in time range)  ?QUEtiapine (SEROQUEL) tablet 600 mg (has no administration in time range)  ?rosuvastatin (CRESTOR) tablet 5 mg (has no administration in time range)  ?sertraline (ZOLOFT) tablet 25 mg (has no administration in time range)  ? ?ED Course/ Medical Decision Making/ A&P ?  ?                        ?Medical Decision Making ?Amount and/or Complexity of Data Reviewed ?Labs: ordered. ? ?Risk ?OTC drugs. ?Prescription drug management. ? ?Pt is a 61 y.o. male with a complex medical history who presents to the emergency department from the behavioral health urgent care due to suicidal and homicidal ideation.  Patient also reports a history of alcohol abuse and typically drinks about 1/5 of liquor per day.  States he has been drinking liquor all day today and stopped drinking just prior to arrival. ? ?Labs: ?CBC without abnormalities. ?CMP with a glucose of 104 and an albumin of 3.4. ?Ethanol 125. ?Salicylate  less than 7. ?Acetaminophen less than 10. ?UDS is within normal limits. ?Respiratory panel is pending. ? ?I, Rayna Sexton, PA-C, personally reviewed and evaluated these images and lab results as part of my m

## 2021-12-14 NOTE — ED Triage Notes (Addendum)
Pt sent from [facility].  States he needs to detox from ETOH.  Last drink 4pm.  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  Seen by Dr. Amalia Hailey yesterday and states it was doing much better.   ? ?Pt changing into burgundy scrubs and security called to wand pt.  Diet tray ordered. ?

## 2021-12-14 NOTE — ED Triage Notes (Signed)
Pt presents to [facility] accompanied by his wife with SI and a plan to cut his wrist or overdose on medication. Pt states " I need to be locked up somewhere". Pt states " I plead the fifth, I'm not going to tell you that" when asked about HI. Pt states he was seen last month by his psychiatrist at [facility] and some of his medications were adjusted but he does not feel like they are working properly. Pt states that he has an upcoming appointment with his psychiatrist on 5/15. Pt appears to be under the influence. Pt states " I drink a 5th of liquor a day". Pt states that he consumed a 5th of alcohol before arriving to this facility. Pt appears to be having trouble breathing. Pt reports being diagnosed with Bipolar, PTSD, and anxiety and states that he is compliant with medication. Pt denies AVH. ?

## 2021-12-14 NOTE — Discharge Instructions (Addendum)
Transfer to [facility] to await IP bed availability   ?

## 2021-12-14 NOTE — ED Notes (Signed)
Called [phone] x4 times, no answer either time.  ?

## 2021-12-15 NOTE — ED Notes (Signed)
Breakfast order placed ?

## 2021-12-15 NOTE — ED Notes (Signed)
Dinner served

## 2021-12-16 NOTE — BH Assessment (Signed)
This Probation officer met with patient this date to evaluate current mental health status. Patient was seen and full CCA was completed on 5/6 when patient initially presented to [facility] requesting assistance with ongoing S/I, H/I and alcohol abuse. Patient met criteria for an inpatient admission that date and was transferred to [facility] due to ongoing medical issues and potential for severe withdrawals. Patient this date continued to voice S/I with multiple plans to self harm stating, "I can have more ways than you know." Patient is vague in reference to specific plan. Patient also voices ongoing H/I towards "a couple people that did him wrong" although again will not voice any additional information. Patient at this time meets criteria for a continued inpatient admission as appropriate bed placement is investigated.      ?

## 2021-12-16 NOTE — ED Notes (Signed)
Breakfast order placed ?

## 2021-12-16 NOTE — ED Provider Notes (Signed)
Emergency Medicine Observation Re-evaluation Note ? ?Rodney Leach is a 61 y.o. male, seen on rounds today.  Pt initially presented to the ED for complaints of Suicidal and Alcohol Problem ?Currently, the patient is resting. ? ?Physical Exam  ?BP (!) 151/95 (BP Location: Left Arm)   Pulse 78   Temp 97.6 ?F (36.4 ?C) (Oral)   Resp 20   SpO2 93%  ?Physical Exam ?General: NAD ?Cardiac: well perfused ?Lungs: even and unlabored ?Psych: no agitation ? ?ED Course / MDM  ?EKG:EKG Interpretation ? ?Date/Time:  Saturday Dec 14 2021 19:42:34 EDT ?Ventricular Rate:  88 ?PR Interval:  156 ?QRS Duration: 132 ?QT Interval:  382 ?QTC Calculation: 462 ?R Axis:   68 ?Text Interpretation: Normal sinus rhythm Right bundle branch block Abnormal ECG When compared with ECG of 07-Oct-2021 14:37, No significant change was found Confirmed by Delora Fuel ([id number]) on 12/15/2021 12:00:03 AM ? ?I have reviewed the labs performed to date as well as medications administered while in observation.  Recent changes in the last 24 hours include pt presented yesterday to the ED from [facility] for SI/HI in the context of EtOH use. Medically cleared and on CIWA. Waiting of TTS assessment. Home meds ordered.  ? ?Plan  ?Current plan is for TTS assessment for disposition. ? Rodney Leach is not under involuntary commitment. ? ? ?  ?Regan Lemming, MD ?12/16/21 [phone] ? ?

## 2021-12-16 NOTE — ED Notes (Signed)
Wife Rodney Leach [phone] wants an update immediately ?

## 2021-12-16 NOTE — ED Provider Notes (Signed)
Emergency Medicine Observation Re-evaluation Note ? ?Rodney Leach is a 61 y.o. male, seen on rounds today.  Pt initially presented to the ED for complaints of chronic/recurrent problems with etoh use disorder, and feelings of depression with intermittent SI.  Pt denies specific, recent inciting event or stressor.  Currently pt indicates goal is to quit using etoh, and get into an outpatient counseling and/or rehab type of program.  He indicates is feeling a bit better today, no SI/plan.  ? ?Physical Exam  ?BP (!) 151/95 (BP Location: Left Arm)   Pulse 78   Temp 97.6 ?F (36.4 ?C) (Oral)   Resp 20   SpO2 93%  ?Physical Exam ?General: alert, content, conversant.  ?Cardiac: regular rate. ?Lungs: breathing comfortably. ?Psych: normal mood and affect, oriented, conversant. Currently denies SI. Pt indicates would like to stay in hospital a couple more days, but indicates does not feel inpatient psychiatric stay or long term residental tx program would be in his best interest - indicating his plan would be to transition to home, with outpatient resources, counseling, etc.  He indicates he has stable, supportive relationship with spouse and his doctor/psychiatrist. Pt is not responding to internal stimuli - no acute psychosis noted.  ? ?ED Course / MDM  ? ? ?I have reviewed the labs performed to date as well as medications administered while in observation.  Recent changes in the last 24 hours include ED obs, reassessment. ? ?Plan  ? ? Rodney Leach is not under involuntary commitment. ? ?[place] reassessment remains pending at this time. ? ?Currently, pt is not acutely psychotic. He appears to have depressed mood, in part, related to chronic/recurrent etoh use issues. No current tremor or shakes, no delirium, no hx of dts, seizures or complicated etoh withdrawal - from that standpoint, pt is medically clear.  ? ?Anticipate BH re-eval and possible connection to outpatient program and resources.  ? ? ? ?  ?Lajean Saver,  MD ?12/16/21 1345 ? ?

## 2021-12-17 ENCOUNTER — Inpatient Hospital Stay
Admission: AD | Admit: 2021-12-17 | Discharge: 2021-12-26 | DRG: 885 | Disposition: A | Discharge status: Home / Self Care | Payer: BC Managed Care – PPO | Source: Intra-hospital | Attending: Emergency Medicine | Admitting: Emergency Medicine

## 2021-12-17 ENCOUNTER — Encounter: Payer: Self-pay | Admitting: Emergency Medicine

## 2021-12-17 ENCOUNTER — Other Ambulatory Visit: Payer: Self-pay

## 2021-12-17 DIAGNOSIS — F3130 Bipolar disorder, current episode depressed, mild or moderate severity, unspecified: Secondary | ICD-10-CM | POA: Diagnosis not present

## 2021-12-17 DIAGNOSIS — F4310 Post-traumatic stress disorder, unspecified: Secondary | ICD-10-CM | POA: Diagnosis present

## 2021-12-17 DIAGNOSIS — R45851 Suicidal ideations: Secondary | ICD-10-CM | POA: Diagnosis present

## 2021-12-17 DIAGNOSIS — E1140 Type 2 diabetes mellitus with diabetic neuropathy, unspecified: Secondary | ICD-10-CM | POA: Diagnosis present

## 2021-12-17 DIAGNOSIS — Z8249 Family history of ischemic heart disease and other diseases of the circulatory system: Secondary | ICD-10-CM | POA: Diagnosis not present

## 2021-12-17 DIAGNOSIS — G894 Chronic pain syndrome: Secondary | ICD-10-CM | POA: Diagnosis not present

## 2021-12-17 DIAGNOSIS — L97429 Non-pressure chronic ulcer of left heel and midfoot with unspecified severity: Secondary | ICD-10-CM | POA: Diagnosis not present

## 2021-12-17 DIAGNOSIS — F10230 Alcohol dependence with withdrawal, uncomplicated: Secondary | ICD-10-CM | POA: Diagnosis not present

## 2021-12-17 DIAGNOSIS — I10 Essential (primary) hypertension: Secondary | ICD-10-CM | POA: Diagnosis not present

## 2021-12-17 DIAGNOSIS — Z20822 Contact with and (suspected) exposure to covid-19: Secondary | ICD-10-CM | POA: Diagnosis not present

## 2021-12-17 DIAGNOSIS — Z79890 Hormone replacement therapy (postmenopausal): Secondary | ICD-10-CM

## 2021-12-17 DIAGNOSIS — F319 Bipolar disorder, unspecified: Secondary | ICD-10-CM

## 2021-12-17 DIAGNOSIS — E782 Mixed hyperlipidemia: Secondary | ICD-10-CM | POA: Diagnosis present

## 2021-12-17 DIAGNOSIS — Z7984 Long term (current) use of oral hypoglycemic drugs: Secondary | ICD-10-CM | POA: Diagnosis not present

## 2021-12-17 DIAGNOSIS — E118 Type 2 diabetes mellitus with unspecified complications: Secondary | ICD-10-CM | POA: Diagnosis present

## 2021-12-17 DIAGNOSIS — F10939 Alcohol use, unspecified with withdrawal, unspecified: Secondary | ICD-10-CM

## 2021-12-17 DIAGNOSIS — F1010 Alcohol abuse, uncomplicated: Secondary | ICD-10-CM | POA: Diagnosis present

## 2021-12-17 DIAGNOSIS — Z8673 Personal history of transient ischemic attack (TIA), and cerebral infarction without residual deficits: Secondary | ICD-10-CM | POA: Diagnosis not present

## 2021-12-17 DIAGNOSIS — Z79899 Other long term (current) drug therapy: Secondary | ICD-10-CM

## 2021-12-17 DIAGNOSIS — F1090 Alcohol use, unspecified, uncomplicated: Secondary | ICD-10-CM

## 2021-12-17 DIAGNOSIS — K219 Gastro-esophageal reflux disease without esophagitis: Secondary | ICD-10-CM | POA: Diagnosis present

## 2021-12-17 DIAGNOSIS — I2510 Atherosclerotic heart disease of native coronary artery without angina pectoris: Secondary | ICD-10-CM | POA: Diagnosis not present

## 2021-12-17 DIAGNOSIS — G4700 Insomnia, unspecified: Secondary | ICD-10-CM | POA: Diagnosis present

## 2021-12-17 DIAGNOSIS — K760 Fatty (change of) liver, not elsewhere classified: Secondary | ICD-10-CM | POA: Diagnosis not present

## 2021-12-17 DIAGNOSIS — J439 Emphysema, unspecified: Secondary | ICD-10-CM | POA: Diagnosis present

## 2021-12-17 DIAGNOSIS — E039 Hypothyroidism, unspecified: Secondary | ICD-10-CM | POA: Diagnosis not present

## 2021-12-17 DIAGNOSIS — Z7982 Long term (current) use of aspirin: Secondary | ICD-10-CM | POA: Diagnosis not present

## 2021-12-17 DIAGNOSIS — F17210 Nicotine dependence, cigarettes, uncomplicated: Secondary | ICD-10-CM | POA: Diagnosis present

## 2021-12-17 DIAGNOSIS — G2581 Restless legs syndrome: Secondary | ICD-10-CM | POA: Diagnosis not present

## 2021-12-17 DIAGNOSIS — J449 Chronic obstructive pulmonary disease, unspecified: Secondary | ICD-10-CM | POA: Diagnosis not present

## 2021-12-17 DIAGNOSIS — Z833 Family history of diabetes mellitus: Secondary | ICD-10-CM

## 2021-12-17 DIAGNOSIS — E569 Vitamin deficiency, unspecified: Secondary | ICD-10-CM | POA: Diagnosis present

## 2021-12-17 DIAGNOSIS — E1169 Type 2 diabetes mellitus with other specified complication: Secondary | ICD-10-CM | POA: Diagnosis present

## 2021-12-17 DIAGNOSIS — E119 Type 2 diabetes mellitus without complications: Secondary | ICD-10-CM | POA: Diagnosis not present

## 2021-12-17 HISTORY — DX: Alcohol use, unspecified, uncomplicated: F10.90

## 2021-12-17 HISTORY — DX: Bipolar disorder, unspecified: F31.9

## 2021-12-17 HISTORY — DX: Suicidal ideations: R45.851

## 2021-12-17 HISTORY — DX: Alcohol use, unspecified with withdrawal, unspecified: F10.939

## 2021-12-17 LAB — RESP PANEL BY RT-PCR (FLU A&B, COVID) ARPGX2
Influenza A by PCR: NEGATIVE
Influenza B by PCR: NEGATIVE
SARS Coronavirus 2 by RT PCR: NEGATIVE

## 2021-12-17 LAB — CBG MONITORING, ED: Glucose-Capillary: 132 mg/dL — ABNORMAL HIGH (ref 70–99)

## 2021-12-17 MED ORDER — CHLORDIAZEPOXIDE HCL 25 MG PO CAPS: 25.0000 mg | ORAL_CAPSULE | Freq: Three times a day (TID) | ORAL | Status: DC

## 2021-12-17 MED ORDER — CHLORDIAZEPOXIDE HCL 25 MG PO CAPS
25.0000 mg | ORAL_CAPSULE | Freq: Four times a day (QID) | ORAL | Status: DC
  Administered 2021-12-17: 25 mg via ORAL
  Filled 2021-12-17: qty 1

## 2021-12-17 MED ORDER — AMLODIPINE BESYLATE 5 MG PO TABS
5.0000 mg | ORAL_TABLET | Freq: Every day | ORAL | Status: DC
  Administered 2021-12-18: 5 mg via ORAL
  Filled 2021-12-17: qty 1

## 2021-12-17 MED ORDER — MAGNESIUM HYDROXIDE 400 MG/5ML PO SUSP: 30.0000 mL | Freq: Every day | ORAL | Status: DC | PRN

## 2021-12-17 MED ORDER — THIAMINE HCL 100 MG PO TABS
100.0000 mg | ORAL_TABLET | Freq: Every day | ORAL | Status: DC
  Administered 2021-12-18 – 2021-12-26 (×9): 100 mg via ORAL
  Filled 2021-12-17 (×9): qty 1

## 2021-12-17 MED ORDER — FAMOTIDINE 20 MG PO TABS
40.0000 mg | ORAL_TABLET | Freq: Every day | ORAL | Status: DC
  Administered 2021-12-17 – 2021-12-26 (×10): 40 mg via ORAL
  Filled 2021-12-17 (×10): qty 2

## 2021-12-17 MED ORDER — CHLORDIAZEPOXIDE HCL 25 MG PO CAPS: 25.0000 mg | ORAL_CAPSULE | Freq: Four times a day (QID) | ORAL | Status: DC | PRN

## 2021-12-17 MED ORDER — FLUOXETINE HCL 20 MG PO CAPS
60.0000 mg | ORAL_CAPSULE | Freq: Every day | ORAL | Status: DC
  Administered 2021-12-18: 60 mg via ORAL
  Filled 2021-12-17: qty 3

## 2021-12-17 MED ORDER — ASPIRIN 81 MG PO CHEW
81.0000 mg | CHEWABLE_TABLET | Freq: Every day | ORAL | Status: DC
  Administered 2021-12-18 – 2021-12-26 (×9): 81 mg via ORAL
  Filled 2021-12-17 (×9): qty 1

## 2021-12-17 MED ORDER — ALUM & MAG HYDROXIDE-SIMETH 200-200-20 MG/5ML PO SUSP: 30.0000 mL | ORAL | Status: DC | PRN

## 2021-12-17 MED ORDER — GABAPENTIN 400 MG PO CAPS
400.0000 mg | ORAL_CAPSULE | Freq: Three times a day (TID) | ORAL | Status: DC
Start: 2021-12-17 — End: 2021-12-26
  Administered 2021-12-17 – 2021-12-26 (×27): 400 mg via ORAL
  Filled 2021-12-17 (×27): qty 1

## 2021-12-17 MED ORDER — LOSARTAN POTASSIUM 50 MG PO TABS
100.0000 mg | ORAL_TABLET | Freq: Every day | ORAL | Status: DC
  Administered 2021-12-18 – 2021-12-26 (×9): 100 mg via ORAL
  Filled 2021-12-17 (×9): qty 2

## 2021-12-17 MED ORDER — ACETAMINOPHEN 325 MG PO TABS
650.0000 mg | ORAL_TABLET | Freq: Four times a day (QID) | ORAL | Status: DC | PRN
  Administered 2021-12-20 – 2021-12-25 (×3): 650 mg via ORAL
  Filled 2021-12-17 (×3): qty 2

## 2021-12-17 MED ORDER — DIVALPROEX SODIUM 500 MG PO DR TAB
500.0000 mg | DELAYED_RELEASE_TABLET | Freq: Two times a day (BID) | ORAL | Status: DC
  Administered 2021-12-17 – 2021-12-18 (×2): 500 mg via ORAL
  Filled 2021-12-17 (×2): qty 1

## 2021-12-17 MED ORDER — CHLORDIAZEPOXIDE HCL 25 MG PO CAPS: 25.0000 mg | ORAL_CAPSULE | Freq: Every day | ORAL | Status: DC

## 2021-12-17 MED ORDER — CHLORDIAZEPOXIDE HCL 25 MG PO CAPS: 25.0000 mg | ORAL_CAPSULE | ORAL | Status: DC

## 2021-12-17 MED ORDER — LEVOTHYROXINE SODIUM 50 MCG PO TABS
175.0000 ug | ORAL_TABLET | Freq: Every day | ORAL | Status: DC
  Administered 2021-12-18 – 2021-12-26 (×9): 175 ug via ORAL
  Filled 2021-12-17 (×10): qty 2

## 2021-12-17 MED ORDER — ROSUVASTATIN CALCIUM 5 MG PO TABS
5.0000 mg | ORAL_TABLET | Freq: Every day | ORAL | Status: DC
  Administered 2021-12-18 – 2021-12-26 (×9): 5 mg via ORAL
  Filled 2021-12-17 (×9): qty 1

## 2021-12-17 NOTE — Tx Team (Signed)
Initial Treatment Plan ?12/17/2021 ?6:48 PM ?Rodney Leach ?[id number] ? ? ? ?PATIENT STRESSORS: ?Financial difficulties   ?Substance abuse   ?Other: Pain   ? ? ?PATIENT STRENGTHS: ?Ability for insight  ?Capable of independent living  ?Communication skills  ?General fund of knowledge  ?Motivation for treatment/growth  ?Supportive family/friends  ? ? ?PATIENT IDENTIFIED PROBLEMS: ?SI  ?Alcohol abuse  ?Depression  ?Anxiety  ?Multiple medical issues  ?  ?  ?  ?  ?  ? ?DISCHARGE CRITERIA:  ?Ability to meet basic life and health needs ?Improved stabilization in mood, thinking, and/or behavior ?Need for constant or close observation no longer present ?Reduction of life-threatening or endangering symptoms to within safe limits ?Withdrawal symptoms are absent or subacute and managed without 24-hour nursing intervention ? ?PRELIMINARY DISCHARGE PLAN: ?Outpatient therapy ?Return to previous living arrangement ? ?PATIENT/FAMILY INVOLVEMENT: ?This treatment plan has been presented to and reviewed with the patient, Rodney Leach. The patient has been given the opportunity to ask questions and make suggestions. ? ?Lyda Kalata, RN ?12/17/2021, 6:48 PM ?

## 2021-12-17 NOTE — Progress Notes (Signed)
Admission Note:  ? ?Report was received from [place], [place] on a 61 year-old male who presents IVC in no acute distress for the treatment of SI and alcohol detox. Patient appears flat and depressed. Patient was calm and cooperative with admission process. Patient endorsed both depression and anxiety to this writer, stating that he is "discombobulated with everything" and his alcohol withdrawals are why he's feeling this way. Patient stated that he had a stroke back in 2013 and that it affected "the memory part of my brain", in which he also stated that he has issues with concentration and confusion about these hospitals. Patient denies any SI/HI/AVH and pain at this time. Patient's goal for treatment is to get help with alcohol detox and to "regain control over my life". Patient states that he lives with his wife and expects to return there upon discharge. Patient also reports that he is "in pain all the time and that takes it's toll on me, and for relief, the only thing I can do legally, is buy alcohol". Patient has a significant past medical history that includes, but is not limited to: Asthma, COPD, CAD, HTN, Stroke, Depression and DM without complication. Skin was assessed with Nicole Kindred, RN and found to be clear of any abnormal marks apart from surgical scars on the midline of his back, bilateral knees, right elbow, all from past surgeries. Patient also has a missing left thumb, a healing callus to the bottom of his left foot, in which he was provided a Band-Aid. Patient also has multiple tattoos on his bilateral upper chest, bilateral outer arms, and a scab to his right middle finger. Patient searched and no contraband found and unit policies explained and understanding verbalized. Consents obtained. Food and fluids offered, and fluids accepted. Patient had no additional questions or concerns to voice at this time. Patient remains safe on the unit. ? ?

## 2021-12-17 NOTE — ED Notes (Signed)
Pt up making phone call to wife. ?

## 2021-12-17 NOTE — ED Provider Notes (Signed)
Emergency Medicine Observation Re-evaluation Note ? ?Rodney Leach is a 61 y.o. male, seen on rounds today.  Pt initially presented to the ED for complaints of Suicidal and Alcohol Problem ?Currently, the patient is resting comfortably in his bed. ? ?Physical Exam  ?BP (!) 165/64 (BP Location: Right Arm)   Pulse 75   Temp 97.6 ?F (36.4 ?C) (Oral)   Resp 16   SpO2 96%  ?Physical Exam ?General: No acute distress ?Cardiac: Regular rate ?Lungs: No respiratory distress ?Psych: Cooperative ? ?ED Course / MDM  ?EKG:EKG Interpretation ? ?Date/Time:  Saturday Dec 14 2021 19:42:34 EDT ?Ventricular Rate:  88 ?PR Interval:  156 ?QRS Duration: 132 ?QT Interval:  382 ?QTC Calculation: 462 ?R Axis:   68 ?Text Interpretation: Normal sinus rhythm Right bundle branch block Abnormal ECG When compared with ECG of 07-Oct-2021 14:37, No significant change was found Confirmed by Delora Fuel ([id number]) on 12/15/2021 12:00:03 AM ? ?I have reviewed the labs performed to date as well as medications administered while in observation.  Recent changes in the last 24 hours include patient was assessed yesterday afternoon by TTS counselor.  It appears that he still eligible for inpatient admission.  No overnight complaints per nursing staff and report.. ? ?Plan  ?Current plan is for awaiting placement for psychiatric stabilization. ? Rodney Leach is not under involuntary commitment. ? ? ?  ?Varney Biles, MD ?12/17/21 1013 ? ?

## 2021-12-17 NOTE — BH Assessment (Addendum)
Patient is to be admitted to [facility]U today 12/17/21 by Dr. Weber Cooks.  ?Attending Physician will be Dr.  Weber Cooks .   ?Patient has been assigned to room 301, by [facility] Charge Nurse, Keil Pickering.   ? ?** Call report to: [phone] ** ? ?ER staff is aware of the admission: ? ?Seth Bake, Patient Access.  ?

## 2021-12-17 NOTE — ED Notes (Signed)
Pt received breakfast tray 

## 2021-12-17 NOTE — Consult Note (Addendum)
Telepsych Consultation  ? ?Reason for Consult:  SI/Alcohol use dx  ?Referring Physician: Lajean Saver, MD ?Location of Patient: [facility] ?Location of Provider: [facility] ? ?Patient Identification: Rodney Leach ?MRN:  [id number] ?Principal Diagnosis: Suicidal ideations ?Diagnosis:  Principal Problem: ?  Suicidal ideations ?Active Problems: ?  Alcohol use disorder ?  Alcohol withdrawal ([place]) ? ? ?Total Time spent with patient: 15 minutes ? ?Subjective:   ?Rodney Leach is a 61 y.o. male patient admitted to the [facility] after he was transferred from the [facility] due to having a boot/splint and dressing to left leg. Patient initially presented to the [facility] on 12/14/21 as walk in with his spouse with complaints of suicidal ideations with a plan, HI and requesting detox.  ? ?HPI:  Patient seen and re-evaluated via tele health by this provider; chart reviewed and consulted with Dr. Dwyane Dee on 12/17/21. On evaluation Rodney Leach is sitting upright in bed, facing the camera with good eye contact. He is alert and oriented x4. His thought process is logical and speech is clear and coherent. His mood is his dysphoric and affect is congruent. ? ?Today, patient endorses passive suicidal ideations with multiple plans to slit his wrists, overdose on pills, or shoot himself.  He states that he last experienced suicidal ideations this morning. He denies having access to a gun at home. He denies attempting suicide in the past. Patient denies homicidal ideations. ? ?Patient denies auditory or visual hallucinations. There is no objective evidence that the patient is currently responding to internal or external stimuli, or exhibiting delusional or paranoid thought content. Patient reports poor sleep, sleeping on average 4 hours per night. He reports a good appetite. ? ?Patient states that he has been drinking 1/5 of liquor or more every day for the past month and a half. He states that he is tired of drinking too much and needs treatment. He  stated that his last alcoholic drink was on Saturday. He reports drinking alcohol since the age of 23. Patient BAL on arrival was 125. UDS negative. Today, he reports withdrawal symptoms of feeling anxious, sweats, nausea, headaches, and body aches. Patient denies a past history of alcohol withdrawal seizures or DTs.  Patient states that his most recent substance use treatment was at [facility] in [place] several months ago. ? ?Patient states that he resides with his wife. He states that he unemployed and took an early retirement. ? ?Patient states that he has a boot/splint to his left leg from her previous surgery about 4 weeks ago. He states that he has a small wound to his left leg that is healing. He states that he changes his dressing once every week. Patient is able to ambulate without an assistive device. He states that he is able to perform his own activities of daily living.  ? ?Past Psychiatric History: Patient has a psychiatric history of bipolar disorder, depression, anxiety, and PTSD.   ? ?Risk to Self:  yes  ?Risk to Others:  denies  ?Prior Inpatient Therapy:  yes  ?Prior Outpatient Therapy:  yes  ? ?Past Medical History:  ?Past Medical History:  ?Diagnosis Date  ? Adenomatous colon polyp   ? ADHD, adult residual type   ? Anxiety 09/03/2012  ? Adequate for discharge  ? Aortic atherosclerosis ([place])   ? Arthritis   ? Asthma   ? Atypical chest pain   ? Bipolar affective ([place])   ? Bipolar depression ([place])   ? Bipolar II disorder ([place])   ?  CAD (coronary artery disease)   ? Carpal tunnel syndrome of right wrist 10/21/2017  ? Cervical disc displacement 03/24/2017  ? Formatting of this note might be different from the original. CERVICAL 5-6, CERVICAL 6-7  ? Chronic pain syndrome   ? Complication of anesthesia   ? has woken up during surgery before   ? Depression 09/03/2012  ? Adequate for discharge  ? Diabetes mellitus without complication ([place])   ? Emphysema/COPD [facility])   ? Essential hypertension 10/09/2021  ? Blood  pressure is picking up.  Hypotensive on arrival.  Resume amlodipine today.  May not need to multiple antihypertensives.  ? GERD (gastroesophageal reflux disease)   ? Hepatic steatosis   ? Hypomagnesemia 10/09/2021  ? Replace aggressively today.  Recheck levels tomorrow including phosphorus and potassium.  ? Hypothyroidism   ? Impotence, organic   ? Irregular heart beat   ? Lumbar radiculopathy, chronic   ? Mixed hyperlipidemia   ? MRSA (methicillin resistant staph aureus) culture positive 08/08/2016  ? Formatting of this note might be different from the original. UPDATE : MRSA RE-SWAB FOR NEXT SURGERY ALSO +MRSA ON 03-24-17  + MRSA NASAL SWAB ON 08-08-16  ? Nodule of lower lobe of right lung   ? Obstructive sleep apnea 10/09/2021  ? Start using CPAP at night.  Wean off oxygen.  ? Pneumonia   ? Postlaminectomy syndrome of lumbosacral region 08/08/2016  ? PTSD (post-traumatic stress disorder)   ? Recurrent major depressive disorder ([place]) 01/30/2018  ? Sciatica 08/08/2016  ? Shock ([place]) 10/07/2021  ? Slurred speech 01/14/2013  ? Spinal stenosis 08/08/2016  ? Stroke [facility])   ? "light" stroke  ? Type 2 diabetes mellitus with complication, without long-term current use of insulin ([place]) 10/09/2021  ? On metformin at home.  Fairly controlled as per patient.  Holding metformin.  Currently on sliding scale insulin.  Resume metformin on discharge.  ? Type 2 diabetes mellitus with diabetic neuropathy ([place])   ? Type 2 diabetes mellitus with hyperlipidemia ([place])   ?  ?Past Surgical History:  ?Procedure Laterality Date  ? BACK SURGERY    ? fusion L5/S1  ? COLONOSCOPY WITH PROPOFOL N/A 09/03/2017  ? Procedure: COLONOSCOPY WITH PROPOFOL;  Surgeon: Laurence Spates, MD;  Location: [place];  Service: Endoscopy;  Laterality: N/A;  ? HAND SURGERY    ? R & L   ? HERNIA REPAIR    ? umbilical  ? MULTIPLE TOOTH EXTRACTIONS    ? NOSE SURGERY    ? fracture repair  ? TOTAL KNEE ARTHROPLASTY  2012  ? Right  ? TOTAL KNEE ARTHROPLASTY  04/07/2012  ?  Procedure: TOTAL KNEE ARTHROPLASTY;  Surgeon: Ninetta Lights, MD;  Location: [place];  Service: Orthopedics;  Laterality: Left;  left total knee arthroplasty  ? ?Family History:  ?Family History  ?Problem Relation Age of Onset  ? Hypertension Mother   ? Diabetes Mother   ? Hypertension Father   ? ?Family Psychiatric  History: No hx reported.  ?Social History:  ?Social History  ? ?Substance and Sexual Activity  ?Alcohol Use Yes  ? Comment: daily  ?   ?Social History  ? ?Substance and Sexual Activity  ?Drug Use No  ?  ?Social History  ? ?Socioeconomic History  ? Marital status: Married  ?  Spouse name: Not on file  ? Number of children: Not on file  ? Years of education: Not on file  ? Highest education level: Not on file  ?Occupational History  ?  Not on file  ?Tobacco Use  ? Smoking status: Every Day  ?  Packs/day: 1.00  ?  Types: Cigarettes  ? Smokeless tobacco: Former  ?  Types: Chew  ?Vaping Use  ? Vaping Use: Never used  ?Substance and Sexual Activity  ? Alcohol use: Yes  ?  Comment: daily  ? Drug use: No  ? Sexual activity: Yes  ?Other Topics Concern  ? Not on file  ?Social History Narrative  ? Not on file  ? ?Social Determinants of Health  ? ?Financial Resource Strain: Not on file  ?Food Insecurity: Not on file  ?Transportation Needs: Not on file  ?Physical Activity: Not on file  ?Stress: Not on file  ?Social Connections: Not on file  ? ?Additional Social History: ?  ? ?Allergies:   ?Allergies  ?Allergen Reactions  ? Carbamazepine Hives  ? Adhesive [Tape] Rash  ? Sulfa Antibiotics Rash  ? ? ?Labs:  ?Results for orders placed or performed during the hospital encounter of 12/14/21 (from the past 48 hour(s))  ?CBG monitoring, ED     Status: Abnormal  ? Collection Time: 12/17/21  7:48 AM  ?Result Value Ref Range  ? Glucose-Capillary 132 (H) 70 - 99 mg/dL  ?  Comment: Glucose reference range applies only to samples taken after fasting for at least 8 hours.  ? ? ?Medications:  ?Current Facility-Administered  Medications  ?Medication Dose Route Frequency Provider Last Rate Last Admin  ? albuterol (VENTOLIN HFA) 108 (90 Base) MCG/ACT inhaler 2 puff  2 puff Inhalation Q6H PRN Rayna Sexton, PA-C   2 puff at 12/16/21 0038

## 2021-12-17 NOTE — ED Notes (Signed)
Breakfast order placed ?

## 2021-12-17 NOTE — ED Notes (Addendum)
Pt requesting supplies to take a shower with. The EMT went to get supplies for the pt and will set the pt up in the shower. Will continue to monitor. Pt's bed linen was changed as well.  ?

## 2021-12-17 NOTE — Progress Notes (Signed)
Patient calm and cooperative during assessment. Pt denies SI/HI/AVH. Pt stated that he had a stroke and his memory hasn't been the same. Pt given support. Pt observed interacting appropriately with staff and peers on the unit. Pt compliant with medication administration per MD orders. Pt being monitored Q 15 minutes for safety per unit protocol. Pt remains safe on the unit.  ?

## 2021-12-17 NOTE — ED Notes (Signed)
Pt speaking with TTS 

## 2021-12-17 NOTE — Plan of Care (Signed)
New admission. ? ?Problem: Education: ?Goal: Knowledge of [facility] Education information/materials will improve ?Outcome: Not Progressing ?Goal: Emotional status will improve ?Outcome: Not Progressing ?Goal: Mental status will improve ?Outcome: Not Progressing ?Goal: Verbalization of understanding the information provided will improve ?Outcome: Not Progressing ?  ?Problem: Safety: ?Goal: Periods of time without injury will increase ?Outcome: Not Progressing ?  ?Problem: Physical Regulation: ?Goal: Complications related to the disease process, condition or treatment will be avoided or minimized ?Outcome: Not Progressing ?  ?Problem: Safety: ?Goal: Ability to remain free from injury will improve ?Outcome: Not Progressing ?  ?Problem: Coping: ?Goal: Ability to identify and develop effective coping behavior will improve ?Outcome: Not Progressing ?  ?Problem: Coping: ?Goal: Coping ability will improve ?Outcome: Not Progressing ?Goal: Will verbalize feelings ?Outcome: Not Progressing ?  ?Problem: Health Behavior/Discharge Planning: ?Goal: Compliance with therapeutic regimen will improve ?Outcome: Not Progressing ?  ?

## 2021-12-18 DIAGNOSIS — F319 Bipolar disorder, unspecified: Secondary | ICD-10-CM

## 2021-12-18 LAB — LIPID PANEL
Cholesterol: 166 mg/dL (ref 0–200)
HDL: 38 mg/dL — ABNORMAL LOW (ref >40)
LDL Cholesterol: 99 mg/dL (ref 0–99)
Total CHOL/HDL Ratio: 4.4 RATIO
Triglycerides: 146 mg/dL (ref <150)
VLDL: 29 mg/dL (ref 0–40)

## 2021-12-18 LAB — GLUCOSE, CAPILLARY: Glucose-Capillary: 107 mg/dL — ABNORMAL HIGH (ref 70–99)

## 2021-12-18 MED ORDER — DIVALPROEX SODIUM 500 MG PO DR TAB
1000.0000 mg | DELAYED_RELEASE_TABLET | Freq: Two times a day (BID) | ORAL | Status: DC
  Administered 2021-12-18 – 2021-12-26 (×16): 1000 mg via ORAL
  Filled 2021-12-18 (×16): qty 2

## 2021-12-18 MED ORDER — MAGNESIUM OXIDE -MG SUPPLEMENT 400 (240 MG) MG PO TABS
400.0000 mg | ORAL_TABLET | Freq: Every day | ORAL | Status: DC
  Administered 2021-12-18 – 2021-12-25 (×8): 400 mg via ORAL
  Filled 2021-12-18 (×8): qty 1

## 2021-12-18 MED ORDER — FOLIC ACID 1 MG PO TABS
1.0000 mg | ORAL_TABLET | Freq: Every day | ORAL | Status: DC
  Administered 2021-12-18 – 2021-12-25 (×8): 1 mg via ORAL
  Filled 2021-12-18 (×8): qty 1

## 2021-12-18 MED ORDER — CARIPRAZINE HCL 1.5 MG PO CAPS
1.5000 mg | ORAL_CAPSULE | Freq: Every day | ORAL | Status: DC
  Administered 2021-12-18 – 2021-12-20 (×3): 1.5 mg via ORAL
  Filled 2021-12-18 (×3): qty 1

## 2021-12-18 MED ORDER — QUETIAPINE FUMARATE 200 MG PO TABS
600.0000 mg | ORAL_TABLET | Freq: Every day | ORAL | Status: DC
  Administered 2021-12-18 – 2021-12-19 (×2): 600 mg via ORAL
  Filled 2021-12-18 (×2): qty 3

## 2021-12-18 MED ORDER — AMLODIPINE BESYLATE 5 MG PO TABS
10.0000 mg | ORAL_TABLET | Freq: Every day | ORAL | Status: DC
Start: 2021-12-19 — End: 2021-12-26
  Administered 2021-12-19 – 2021-12-26 (×8): 10 mg via ORAL
  Filled 2021-12-18 (×8): qty 2

## 2021-12-18 NOTE — Progress Notes (Signed)
Recreation Therapy Notes ? ? ?Date: 12/18/2021 ? ?Time: 10:15 am   ? ?Location: Courtyard   ? ?Behavioral response: N/A ?  ?Intervention Topic: Leisure   ? ?Discussion/Intervention: ?Patient refused to attend group.  ? ?Clinical Observations/Feedback:  ?Patient refused to attend group.  ?  ?Adalin Vanderploeg LRT/CTRS ? ? ? ? ? ? ? ?Korryn Pancoast ?12/18/2021 12:11 PM ?

## 2021-12-18 NOTE — H&P (Signed)
Psychiatric Admission Assessment Adult ? ?Patient Identification: Rodney Leach ?MRN:  [id number] ?Date of Evaluation:  12/18/2021 ?Chief Complaint:  Bipolar disorder (manic depression) ([place]) [F31.9] ?Principal Diagnosis: Bipolar depression ([place]) ?Diagnosis:  Principal Problem: ?  Bipolar depression ([place]) ?Active Problems: ?  Type 2 diabetes mellitus with complication, without long-term current use of insulin ([place]) ?  Essential hypertension ?  Hypothyroidism ?  Alcohol use disorder ?  Bipolar disorder (manic depression) ([place]) ? ?History of Present Illness: Patient seen and chart reviewed.  61 year old man with a long history of depression as part of bipolar disorder and a long history of alcohol abuse presented to the hospital in [place] with depression and suicidal ideation.  Patient also presented with alcohol abuse relapse saying that he had started drinking again about a month ago and had been consuming about a 750 mL bottle of liquor a day.  Mood had been really down and depressed.  He also describes himself as just being "discombobulated" in his thinking and feeling confused much of the time.  He does not report any hallucinations or specific psychotic symptoms.  He says he has daily suicidal thoughts but has no intent or plan.  Patient claims that he has been continuing to follow up with his outpatient psychiatric provider and that a medicine change was made about 6 weeks ago.  His wife however had stated in one of the notes that he was only getting his medicines from his regular outpatient doctor and not from a psychiatric provider.  Patient's memory is by his report very bad for medicine.  He denies any history of withdrawal seizures or delirium tremens ?Associated Signs/Symptoms: ?Depression Symptoms:  depressed mood, ?anhedonia, ?insomnia, ?fatigue, ?feelings of worthlessness/guilt, ?difficulty concentrating, ?hopelessness, ?impaired memory, ?suicidal thoughts without plan, ?Duration of Depression  Symptoms: Greater than two weeks ? ?(Hypo) Manic Symptoms:  Impulsivity, ?Anxiety Symptoms:  Excessive Worry, ?Psychotic Symptoms:   Denies any ?PTSD Symptoms: ?He does have a previous history of a diagnosis of PTSD and some of his chronic anxiety and depression could be related to this although he does not discuss any specific trauma in his current history ?Total Time spent with patient: 1 hour ? ?Past Psychiatric History: Long history of depression going back over 20 years.  Patient tells me he had ECT treatment about 20 years ago but that he had complications from it and did not complete the course.  Of course we do not have any records available of that right now.  Over the years it appears that he has been on a great many different medicines including antidepressants and medicines for bipolar depression.  By his report as well as his wife's medicines will always seem like they worked temporarily and then we will "wear off" Long history of alcohol abuse with extended periods of sobriety interrupted by periods of heavy binging.  Denies ever having actually tried to kill himself ? ?Is the patient at risk to self? Yes.    ?Has the patient been a risk to self in the past 6 months? Yes.    ?Has the patient been a risk to self within the distant past? Yes.    ?Is the patient a risk to others? No.  ?Has the patient been a risk to others in the past 6 months? No.  ?Has the patient been a risk to others within the distant past? No.  ? ?Prior Inpatient Therapy:   ?Prior Outpatient Therapy:   ? ?Alcohol Screening: 1. How often do you have  a drink containing alcohol?: 4 or more times a week ?2. How many drinks containing alcohol do you have on a typical day when you are drinking?: 10 or more ?3. How often do you have six or more drinks on one occasion?: Daily or almost daily ?AUDIT-C Score: 12 ?4. How often during the last year have you found that you were not able to stop drinking once you had started?: Less than  monthly ?5. How often during the last year have you failed to do what was normally expected from you because of drinking?: Daily or almost daily ?6. How often during the last year have you needed a first drink in the morning to get yourself going after a heavy drinking session?: Daily or almost daily ?7. How often during the last year have you had a feeling of guilt of remorse after drinking?: Daily or almost daily ?8. How often during the last year have you been unable to remember what happened the night before because you had been drinking?: Daily or almost daily ?9. Have you or someone else been injured as a result of your drinking?: No ?10. Has a relative or friend or a doctor or another health worker been concerned about your drinking or suggested you cut down?: Yes, but not in the last year ?Alcohol Use Disorder Identification Test Final Score (AUDIT): 31 ?Alcohol Brief Interventions/Follow-up: Alcohol education/Brief advice (patient wants help with detox) ?Substance Abuse History in the last 12 months:  Yes.   ?Consequences of Substance Abuse: ?Alcohol abuse obviously complicating and worsening mood problems ?Previous Psychotropic Medications: Yes  ?Psychological Evaluations: Yes  ?Past Medical History:  ?Past Medical History:  ?Diagnosis Date  ? Adenomatous colon polyp   ? ADHD, adult residual type   ? Anxiety 09/03/2012  ? Adequate for discharge  ? Aortic atherosclerosis ([place])   ? Arthritis   ? Asthma   ? Atypical chest pain   ? Bipolar affective ([place])   ? Bipolar depression ([place])   ? Bipolar II disorder ([place])   ? CAD (coronary artery disease)   ? Carpal tunnel syndrome of right wrist 10/21/2017  ? Cervical disc displacement 03/24/2017  ? Formatting of this note might be different from the original. CERVICAL 5-6, CERVICAL 6-7  ? Chronic pain syndrome   ? Complication of anesthesia   ? has woken up during surgery before   ? Depression 09/03/2012  ? Adequate for discharge  ? Diabetes mellitus without complication  ([place])   ? Emphysema/COPD [facility])   ? Essential hypertension 10/09/2021  ? Blood pressure is picking up.  Hypotensive on arrival.  Resume amlodipine today.  May not need to multiple antihypertensives.  ? GERD (gastroesophageal reflux disease)   ? Hepatic steatosis   ? Hypomagnesemia 10/09/2021  ? Replace aggressively today.  Recheck levels tomorrow including phosphorus and potassium.  ? Hypothyroidism   ? Impotence, organic   ? Irregular heart beat   ? Lumbar radiculopathy, chronic   ? Mixed hyperlipidemia   ? MRSA (methicillin resistant staph aureus) culture positive 08/08/2016  ? Formatting of this note might be different from the original. UPDATE : MRSA RE-SWAB FOR NEXT SURGERY ALSO +MRSA ON 03-24-17  + MRSA NASAL SWAB ON 08-08-16  ? Nodule of lower lobe of right lung   ? Obstructive sleep apnea 10/09/2021  ? Start using CPAP at night.  Wean off oxygen.  ? Pneumonia   ? Postlaminectomy syndrome of lumbosacral region 08/08/2016  ? PTSD (post-traumatic stress disorder)   ?  Recurrent major depressive disorder ([place]) 01/30/2018  ? Sciatica 08/08/2016  ? Shock ([place]) 10/07/2021  ? Slurred speech 01/14/2013  ? Spinal stenosis 08/08/2016  ? Stroke [facility])   ? "light" stroke  ? Type 2 diabetes mellitus with complication, without long-term current use of insulin ([place]) 10/09/2021  ? On metformin at home.  Fairly controlled as per patient.  Holding metformin.  Currently on sliding scale insulin.  Resume metformin on discharge.  ? Type 2 diabetes mellitus with diabetic neuropathy ([place])   ? Type 2 diabetes mellitus with hyperlipidemia ([place])   ?  ?Past Surgical History:  ?Procedure Laterality Date  ? BACK SURGERY    ? fusion L5/S1  ? COLONOSCOPY WITH PROPOFOL N/A 09/03/2017  ? Procedure: COLONOSCOPY WITH PROPOFOL;  Surgeon: Laurence Spates, MD;  Location: [place];  Service: Endoscopy;  Laterality: N/A;  ? HAND SURGERY    ? R & L   ? HERNIA REPAIR    ? umbilical  ? MULTIPLE TOOTH EXTRACTIONS    ? NOSE SURGERY    ? fracture repair  ? TOTAL KNEE  ARTHROPLASTY  2012  ? Right  ? TOTAL KNEE ARTHROPLASTY  04/07/2012  ? Procedure: TOTAL KNEE ARTHROPLASTY;  Surgeon: Ninetta Lights, MD;  Location: [place];  Service: Orthopedics;  Laterality: Left;  left total kn
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
## 2021-12-18 NOTE — [facility] Suicide Risk Assessment (Signed)
[facility] Admission Suicide Risk Assessment ? ? ?Nursing information obtained from:  Patient ?Demographic factors:  Male, Caucasian, Unemployed ?Current Mental Status:  NA ?Loss Factors:  Financial problems / change in socioeconomic status ?Historical Factors:  NA ?Risk Reduction Factors:  Sense of responsibility to family, Living with another person, especially a relative ? ?Total Time spent with patient: 1 hour ?Principal Problem: Bipolar depression ([place]) ?Diagnosis:  Principal Problem: ?  Bipolar depression ([place]) ?Active Problems: ?  Type 2 diabetes mellitus with complication, without long-term current use of insulin ([place]) ?  Essential hypertension ?  Hypothyroidism ?  Alcohol use disorder ?  Bipolar disorder (manic depression) ([place]) ? ?Subjective Data: Patient seen and chart reviewed.  61 year old man with a history of recurrent depression associated with bipolar disorder as well as alcohol abuse presented to the hospital with suicidal ideation with no intention or plan.  On evaluation today patient reports depressed mood and continues to report suicidal thoughts but with no intention or plan to act on it. ? ?Continued Clinical Symptoms:  ?Alcohol Use Disorder Identification Test Final Score (AUDIT): 31 ?The "Alcohol Use Disorders Identification Test", Guidelines for Use in Primary Care, Second Edition.  World Pharmacologist [facility]). ?Score between 0-7:  no or low risk or alcohol related problems. ?Score between 8-15:  moderate risk of alcohol related problems. ?Score between 16-19:  high risk of alcohol related problems. ?Score 20 or above:  warrants further diagnostic evaluation for alcohol dependence and treatment. ? ? ?CLINICAL FACTORS:  ? Depression:   Comorbid alcohol abuse/dependence ? ? ?Musculoskeletal: ?Strength & Muscle Tone: within normal limits ?Gait & Station: normal ?Patient leans: N/A ? ?Psychiatric Specialty Exam: ? ?Presentation  ?General Appearance: Appropriate for Environment ? ?Eye  Contact:Fair ? ?Speech:Clear and Coherent ? ?Speech Volume:Normal ? ?Handedness:Right ? ? ?Mood and Affect  ?Mood:Dysphoric ? ?Affect:Congruent ? ? ?Thought Process  ?Thought Processes:Coherent; Goal Directed ? ?Descriptions of Associations:Intact ? ?Orientation:Full (Time, Place and Person) ? ?Thought Content:Logical ? ?History of Schizophrenia/Schizoaffective disorder:No ? ?Duration of Psychotic Symptoms:No data recorded ?Hallucinations:Hallucinations: None ? ?Ideas of Reference:None ? ?Suicidal Thoughts:Suicidal Thoughts: Yes, Passive ?SI Passive Intent and/or Plan: With Plan ? ?Homicidal Thoughts:Homicidal Thoughts: No ? ? ?Sensorium  ?Memory:Immediate Fair; Recent Fair; Remote Fair ? ?Judgment:Intact ? ?Insight:Fair ? ? ?Executive Functions  ?Concentration:Fair ? ?Attention Span:Fair ? ?Recall:Fair ? ?[place] ? ?Language:Fair ? ? ?Psychomotor Activity  ?Psychomotor Activity:Psychomotor Activity: Normal ? ? ?Assets  ?Assets:Communication Skills; Desire for Improvement; Financial Resources/Insurance; Housing; Intimacy; Leisure Time; Physical Health; Social Support; Resilience ? ? ?Sleep  ?Sleep:Sleep: Poor ?Number of Hours of Sleep: 4 ? ? ? ?Physical Exam: ?Physical Exam ?Vitals and nursing note reviewed.  ?Constitutional:   ?   Appearance: Normal appearance.  ?HENT:  ?   Head: Normocephalic and atraumatic.  ?   Mouth/Throat:  ?   Pharynx: Oropharynx is clear.  ?Eyes:  ?   Pupils: Pupils are equal, round, and reactive to light.  ?Cardiovascular:  ?   Rate and Rhythm: Normal rate and regular rhythm.  ?Pulmonary:  ?   Effort: Pulmonary effort is normal.  ?   Breath sounds: Normal breath sounds.  ?Abdominal:  ?   General: Abdomen is flat.  ?   Palpations: Abdomen is soft.  ?Musculoskeletal:     ?   General: Normal range of motion.  ?Skin: ?   General: Skin is warm and dry.  ?Neurological:  ?   General: No focal deficit present.  ?   Mental Status:  He is alert. Mental status is at baseline.   ?Psychiatric:     ?   Attention and Perception: Attention normal.     ?   Mood and Affect: Mood is anxious and depressed.     ?   Speech: Speech normal.     ?   Behavior: Behavior is cooperative.     ?   Thought Content: Thought content includes suicidal ideation. Thought content does not include suicidal plan.     ?   Cognition and Memory: Cognition normal.     ?   Judgment: Judgment normal.  ? ?Review of Systems  ?Constitutional: Negative.   ?HENT: Negative.    ?Eyes: Negative.   ?Respiratory: Negative.    ?Cardiovascular: Negative.   ?Gastrointestinal: Negative.   ?Musculoskeletal: Negative.   ?Skin: Negative.   ?Neurological: Negative.   ?Psychiatric/Behavioral:  Positive for depression, substance abuse and suicidal ideas. Negative for hallucinations. The patient is nervous/anxious and has insomnia.   ?Blood pressure (!) 155/104, pulse 70, temperature 98.3 ?F (36.8 ?C), resp. rate 20, height '6\' 4"'$  (1.93 m), weight (!) 149.2 kg, SpO2 92 %. Body mass index is 40.05 kg/m?. ? ? ?COGNITIVE FEATURES THAT CONTRIBUTE TO RISK:  ?Thought constriction (tunnel vision)   ? ?SUICIDE RISK:  ? Mild:  Suicidal ideation of limited frequency, intensity, duration, and specificity.  There are no identifiable plans, no associated intent, mild dysphoria and related symptoms, good self-control (both objective and subjective assessment), few other risk factors, and identifiable protective factors, including available and accessible social support. ? ?PLAN OF CARE: Continue 15-minute checks.  Reviewed whole treatment plan of medication with the patient.  Restart outpatient medicine and recommend a trial of Vraylar as well.  Engage in individual and group therapy.  Ongoing daily assessment of dangerousness prior to discharge planning ? ?I certify that inpatient services furnished can reasonably be expected to improve the patient's condition.  ? ?Alethia Berthold, MD ?12/18/2021, 3:38 PM ? ?

## 2021-12-18 NOTE — Group Note (Signed)
[place] LCSW Group Therapy Note ? ? ? ?Group Date: 12/18/2021 ?Start Time: 1330 ?End Time: 1430 ? ?Type of Therapy and Topic:  Group Therapy:  Overcoming Obstacles ? ?Participation Level:  [facility] PARTICIPATION LEVEL: Active ? ?Mood: ? ?Description of Group:   ?In this group patients will be encouraged to explore what they see as obstacles to their own wellness and recovery. They will be guided to discuss their thoughts, feelings, and behaviors related to these obstacles. The group will process together ways to cope with barriers, with attention given to specific choices patients can make. Each patient will be challenged to identify changes they are motivated to make in order to overcome their obstacles. This group will be process-oriented, with patients participating in exploration of their own experiences as well as giving and receiving support and challenge from other group members. ? ?Therapeutic Goals: ?1. Patient will identify personal and current obstacles as they relate to admission. ?2. Patient will identify barriers that currently interfere with their wellness or overcoming obstacles.  ?3. Patient will identify feelings, thought process and behaviors related to these barriers. ?4. Patient will identify two changes they are willing to make to overcome these obstacles:  ? ? ?Summary of Patient Progress ?Patient was an active participant in group.  Patient reports that an obstacle he has been  dealing with recently has been finances.  Patient was attentive and supportive in group.   ? ?Therapeutic Modalities:   ?Cognitive Behavioral Therapy ?Solution Focused Therapy ?Motivational Interviewing ?Relapse Prevention Therapy ? ? ?Rozann Lesches, LCSW ?

## 2021-12-18 NOTE — Plan of Care (Signed)
D: Pt alert and oriented. Pt rates anxiety 7/10 and denies experiencing any anxiety at this time. Pt reports experiencing 7/10 chronic pain at this time and denies wanting any pain management medications at this time. Pt denies experiencing any SI/HI, or AVH at this time.  ? ?Pt can be observed interacting with others as well as being present in the milieu. Pt is calm and cooperative. ? ?A: Scheduled medications administered to pt, per MD orders. Support and encouragement provided. Frequent verbal contact made. Routine safety checks conducted q15 minutes.  ? ?R: No adverse drug reactions noted. Pt verbally contracts for safety at this time. Pt compliant with medications and treatment plan. Pt interacts well with others on the unit. Pt remains safe at this time. Will continue to monitor.  ? ?Problem: Education: ?Goal: Knowledge of [facility] Education information/materials will improve ?Outcome: Progressing ?Goal: Mental status will improve ?Outcome: Progressing ?  ?

## 2021-12-19 DIAGNOSIS — F319 Bipolar disorder, unspecified: Secondary | ICD-10-CM | POA: Diagnosis not present

## 2021-12-19 NOTE — Progress Notes (Signed)
Recreation Therapy Notes ? ? ?Date: 12/19/2021 ?  ?Time: 10:30 am   ?  ?Location: Court yard  ?  ?Behavioral response: N/A ?  ?Intervention Topic: Social Skills  ?  ?Discussion/Intervention: ?Patient refused to attend group.  ?  ?Clinical Observations/Feedback:  ?Patient refused to attend group.  ?  ?Rodney Leach LRT/CTRS ?  ?  ? ? ? ? ? ? ? ?Rodney Leach ?12/19/2021 11:57 AM ? ? ? ? ? ? ? ?Rodney Leach ?12/19/2021 11:57 AM ?

## 2021-12-19 NOTE — Progress Notes (Signed)
[facility] MD Progress Note ? ?12/19/2021 1:58 PM ?Rodney Leach  ?MRN:  [id number] ?Subjective: Patient seen for follow-up.  61 year old man with a history of depression thought to be part of bipolar disorder.  He tells me he slept better last night.  He reports that he woke up in the middle of the night confused and a little dizzy but did not have a fall and ultimately felt like he slept better.  He also says he feels like his mind is a little quieter today.  A little bit less depressed.  No active suicidal thoughts.  No new complaints. ?Principal Problem: Bipolar depression ([place]) ?Diagnosis: Principal Problem: ?  Bipolar depression ([place]) ?Active Problems: ?  Type 2 diabetes mellitus with complication, without long-term current use of insulin ([place]) ?  Essential hypertension ?  Hypothyroidism ?  Alcohol use disorder ?  Bipolar disorder (manic depression) ([place]) ? ?Total Time spent with patient: 30 minutes ? ?Past Psychiatric History: Past history of recurrent depression and bipolar disorder and alcohol abuse ? ?Past Medical History:  ?Past Medical History:  ?Diagnosis Date  ? Adenomatous colon polyp   ? ADHD, adult residual type   ? Anxiety 09/03/2012  ? Adequate for discharge  ? Aortic atherosclerosis ([place])   ? Arthritis   ? Asthma   ? Atypical chest pain   ? Bipolar affective ([place])   ? Bipolar depression ([place])   ? Bipolar II disorder ([place])   ? CAD (coronary artery disease)   ? Carpal tunnel syndrome of right wrist 10/21/2017  ? Cervical disc displacement 03/24/2017  ? Formatting of this note might be different from the original. CERVICAL 5-6, CERVICAL 6-7  ? Chronic pain syndrome   ? Complication of anesthesia   ? has woken up during surgery before   ? Depression 09/03/2012  ? Adequate for discharge  ? Diabetes mellitus without complication ([place])   ? Emphysema/COPD [facility])   ? Essential hypertension 10/09/2021  ? Blood pressure is picking up.  Hypotensive on arrival.  Resume amlodipine today.  May not need to multiple  antihypertensives.  ? GERD (gastroesophageal reflux disease)   ? Hepatic steatosis   ? Hypomagnesemia 10/09/2021  ? Replace aggressively today.  Recheck levels tomorrow including phosphorus and potassium.  ? Hypothyroidism   ? Impotence, organic   ? Irregular heart beat   ? Lumbar radiculopathy, chronic   ? Mixed hyperlipidemia   ? MRSA (methicillin resistant staph aureus) culture positive 08/08/2016  ? Formatting of this note might be different from the original. UPDATE : MRSA RE-SWAB FOR NEXT SURGERY ALSO +MRSA ON 03-24-17  + MRSA NASAL SWAB ON 08-08-16  ? Nodule of lower lobe of right lung   ? Obstructive sleep apnea 10/09/2021  ? Start using CPAP at night.  Wean off oxygen.  ? Pneumonia   ? Postlaminectomy syndrome of lumbosacral region 08/08/2016  ? PTSD (post-traumatic stress disorder)   ? Recurrent major depressive disorder ([place]) 01/30/2018  ? Sciatica 08/08/2016  ? Shock ([place]) 10/07/2021  ? Slurred speech 01/14/2013  ? Spinal stenosis 08/08/2016  ? Stroke [facility])   ? "light" stroke  ? Type 2 diabetes mellitus with complication, without long-term current use of insulin ([place]) 10/09/2021  ? On metformin at home.  Fairly controlled as per patient.  Holding metformin.  Currently on sliding scale insulin.  Resume metformin on discharge.  ? Type 2 diabetes mellitus with diabetic neuropathy ([place])   ? Type 2 diabetes mellitus with hyperlipidemia ([place])   ?  ?  Past Surgical History:  ?Procedure Laterality Date  ? BACK SURGERY    ? fusion L5/S1  ? COLONOSCOPY WITH PROPOFOL N/A 09/03/2017  ? Procedure: COLONOSCOPY WITH PROPOFOL;  Surgeon: Laurence Spates, MD;  Location: [place];  Service: Endoscopy;  Laterality: N/A;  ? HAND SURGERY    ? R & L   ? HERNIA REPAIR    ? umbilical  ? MULTIPLE TOOTH EXTRACTIONS    ? NOSE SURGERY    ? fracture repair  ? TOTAL KNEE ARTHROPLASTY  2012  ? Right  ? TOTAL KNEE ARTHROPLASTY  04/07/2012  ? Procedure: TOTAL KNEE ARTHROPLASTY;  Surgeon: Ninetta Lights, MD;  Location: [place];  Service: Orthopedics;   Laterality: Left;  left total knee arthroplasty  ? ?Family History:  ?Family History  ?Problem Relation Age of Onset  ? Hypertension Mother   ? Diabetes Mother   ? Hypertension Father   ? ?Family Psychiatric  History: See previous ?Social History:  ?Social History  ? ?Substance and Sexual Activity  ?Alcohol Use Yes  ? Comment: daily  ?   ?Social History  ? ?Substance and Sexual Activity  ?Drug Use No  ?  ?Social History  ? ?Socioeconomic History  ? Marital status: Married  ?  Spouse name: Not on file  ? Number of children: Not on file  ? Years of education: Not on file  ? Highest education level: Not on file  ?Occupational History  ? Not on file  ?Tobacco Use  ? Smoking status: Every Day  ?  Packs/day: 1.00  ?  Types: Cigarettes  ? Smokeless tobacco: Former  ?  Types: Chew  ?Vaping Use  ? Vaping Use: Never used  ?Substance and Sexual Activity  ? Alcohol use: Yes  ?  Comment: daily  ? Drug use: No  ? Sexual activity: Yes  ?Other Topics Concern  ? Not on file  ?Social History Narrative  ? Not on file  ? ?Social Determinants of Health  ? ?Financial Resource Strain: Not on file  ?Food Insecurity: Not on file  ?Transportation Needs: Not on file  ?Physical Activity: Not on file  ?Stress: Not on file  ?Social Connections: Not on file  ? ?Additional Social History:  ?  ?  ?  ?  ?  ?  ?  ?  ?  ?  ?  ? ?Sleep: Fair ? ?Appetite:  Fair ? ?Current Medications: ?Current Facility-Administered Medications  ?Medication Dose Route Frequency Provider Last Rate Last Admin  ? acetaminophen (TYLENOL) tablet 650 mg  650 mg Oral Q6H PRN Freida Busman, MD      ? alum & mag hydroxide-simeth (MAALOX/MYLANTA) 200-200-20 MG/5ML suspension 30 mL  30 mL Oral Q4H PRN Damita Dunnings B, MD      ? amLODipine (NORVASC) tablet 10 mg  10 mg Oral Daily Emy Angevine, Madie Reno, MD   10 mg at 12/19/21 0732  ? aspirin chewable tablet 81 mg  81 mg Oral Daily Damita Dunnings B, MD   81 mg at 12/19/21 0732  ? cariprazine (VRAYLAR) capsule 1.5 mg  1.5 mg Oral Daily  Mackenzy Grumbine, Madie Reno, MD   1.5 mg at 12/19/21 0732  ? divalproex (DEPAKOTE) DR tablet 1,000 mg  1,000 mg Oral BID Orvil Faraone, Madie Reno, MD   1,000 mg at 12/19/21 0732  ? famotidine (PEPCID) tablet 40 mg  40 mg Oral Daily Damita Dunnings B, MD   40 mg at 12/19/21 0732  ? folic acid (FOLVITE) tablet 1 mg  1 mg Oral QHS Karyssa Amaral T, MD   1 mg at 12/18/21 2125  ? gabapentin (NEURONTIN) capsule 400 mg  400 mg Oral TID Damita Dunnings B, MD   400 mg at 12/19/21 1153  ? levothyroxine (SYNTHROID) tablet 175 mcg  175 mcg Oral Q0600 Freida Busman, MD   175 mcg at 12/19/21 6468  ? losartan (COZAAR) tablet 100 mg  100 mg Oral Daily Damita Dunnings B, MD   100 mg at 12/19/21 0321  ? magnesium hydroxide (MILK OF MAGNESIA) suspension 30 mL  30 mL Oral Daily PRN Damita Dunnings B, MD      ? magnesium oxide (MAG-OX) tablet 400 mg  400 mg Oral QHS Retal Tonkinson, Madie Reno, MD   400 mg at 12/18/21 2125  ? QUEtiapine (SEROQUEL) tablet 600 mg  600 mg Oral QHS Ryliegh Mcduffey, Madie Reno, MD   600 mg at 12/18/21 2125  ? rosuvastatin (CRESTOR) tablet 5 mg  5 mg Oral Daily Damita Dunnings B, MD   5 mg at 12/19/21 2248  ? thiamine tablet 100 mg  100 mg Oral Daily Damita Dunnings B, MD   100 mg at 12/19/21 0732  ? ? ?Lab Results:  ?Results for orders placed or performed during the hospital encounter of 12/17/21 (from the past 48 hour(s))  ?Lipid panel     Status: Abnormal  ? Collection Time: 12/18/21  9:32 AM  ?Result Value Ref Range  ? Cholesterol 166 0 - 200 mg/dL  ? Triglycerides 146 <150 mg/dL  ? HDL 38 (L) >40 mg/dL  ? Total CHOL/HDL Ratio 4.4 RATIO  ? VLDL 29 0 - 40 mg/dL  ? LDL Cholesterol 99 0 - 99 mg/dL  ?  Comment:        ?Total Cholesterol/HDL:CHD Risk ?Coronary Heart Disease Risk Table ?                    Men   Women ? 1/2 Average Risk   3.4   3.3 ? Average Risk       5.0   4.4 ? 2 X Average Risk   9.6   7.1 ? 3 X Average Risk  23.4   11.0 ?       ?Use the calculated Patient Ratio ?above and the CHD Risk Table ?to determine the patient's CHD Risk. ?       ?ATP III  CLASSIFICATION (LDL): ? <100     mg/dL   Optimal ? 100-129  mg/dL   Near or Above ?                   Optimal ? 130-159  mg/dL   Borderline ? 160-189  mg/dL   High ? >190     mg/dL   Very High ?Performed at [facility]

## 2021-12-19 NOTE — Progress Notes (Signed)
Patient has been tearful stating that he had a rough day was not willing to share stated that he hope with the medication he will have a better day in the morning. Denis SI/HI/A/VH and verbally contracted for safety.  ?

## 2021-12-19 NOTE — Group Note (Signed)
[facility] LCSW Group Therapy Note ? ? ? ?Group Date: 12/19/2021 ?Start Time: 1300 ?End Time: 1400 ? ?Type of Therapy and Topic:  Group Therapy:  Overcoming Obstacles ? ?Participation Level:  [facility] PARTICIPATION LEVEL: Did Not Attend ? ?Mood: ? ?Description of Group:   ?In this group patients will be encouraged to explore what they see as obstacles to their own wellness and recovery. They will be guided to discuss their thoughts, feelings, and behaviors related to these obstacles. The group will process together ways to cope with barriers, with attention given to specific choices patients can make. Each patient will be challenged to identify changes they are motivated to make in order to overcome their obstacles. This group will be process-oriented, with patients participating in exploration of their own experiences as well as giving and receiving support and challenge from other group members. ? ?Therapeutic Goals: ?1. Patient will identify personal and current obstacles as they relate to admission. ?2. Patient will identify barriers that currently interfere with their wellness or overcoming obstacles.  ?3. Patient will identify feelings, thought process and behaviors related to these barriers. ?4. Patient will identify two changes they are willing to make to overcome these obstacles:  ? ? ?Summary of Patient Progress ? ?Patient did not attend group despite encouraged participation.  ? ? ?Therapeutic Modalities:   ?Cognitive Behavioral Therapy ?Solution Focused Therapy ?Motivational Interviewing ?Relapse Prevention Therapy ? ? ?Durenda Hurt, LCSWA ?

## 2021-12-19 NOTE — Plan of Care (Signed)
D: Pt alert and oriented. Pt rates depression 8/10, hopelessness 7/10, and anxiety 7/10. Pt goal: "notice med change." Pt reports energy level as low and concentration as being good. Pt reports sleep last night as being good. Pt did receive medications for sleep and did find them helpful. Pt reports experiencing 7/10 generalize chronic pain at this time, pt refuses prn pain management meds. Pt denies experiencing any SI/HI, or AVH at this time.  ? ?A: Scheduled medications administered to pt, per MD orders. Support and encouragement provided. Frequent verbal contact made. Routine safety checks conducted q15 minutes.  ? ?R: No adverse drug reactions noted. Pt verbally contracts for safety at this time. Pt compliant with medications and treatment plan. Pt interacts well with others on the unit. Pt remains safe at this time. Will continue to monitor.  ? ?Problem: Education: ?Goal: Knowledge of [facility] Education information/materials will improve ?Outcome: Progressing ?  ?Problem: Coping: ?Goal: Will verbalize feelings ?Outcome: Progressing ?  ?

## 2021-12-20 DIAGNOSIS — F319 Bipolar disorder, unspecified: Secondary | ICD-10-CM | POA: Diagnosis not present

## 2021-12-20 MED ORDER — CARIPRAZINE HCL 1.5 MG PO CAPS
3.0000 mg | ORAL_CAPSULE | Freq: Every day | ORAL | Status: DC
  Administered 2021-12-21 – 2021-12-23 (×3): 3 mg via ORAL
  Filled 2021-12-20 (×3): qty 2

## 2021-12-20 MED ORDER — METOPROLOL SUCCINATE ER 25 MG PO TB24
25.0000 mg | ORAL_TABLET | Freq: Every day | ORAL | Status: DC
  Administered 2021-12-20 – 2021-12-26 (×7): 25 mg via ORAL
  Filled 2021-12-20 (×7): qty 1

## 2021-12-20 MED ORDER — QUETIAPINE FUMARATE 200 MG PO TABS
800.0000 mg | ORAL_TABLET | Freq: Every day | ORAL | Status: DC
  Administered 2021-12-20 – 2021-12-25 (×6): 800 mg via ORAL
  Filled 2021-12-20 (×6): qty 4

## 2021-12-20 NOTE — Progress Notes (Signed)
Patient asked for some Fixodent, while receiving his evening medication. Patient pulled his bottom dentures out and put some of the adhesive on and popped his teeth back in. Patient tolerated medication administration well, without any issues. Patient remains safe on the unit. ?

## 2021-12-20 NOTE — Progress Notes (Signed)
[facility] MD Progress Note ? ?12/20/2021 3:16 PM ?Rodney Leach  ?MRN:  [id number] ?Subjective: Follow-up for this patient with bipolar depression.  Patient seen and chart reviewed.  Patient says that he is feeling a little better.  Mood is improved.  He says he still thinks he could get a better night sleep.  No complaints of any side effects.  Blood pressure remains elevated ?Principal Problem: Bipolar depression ([place]) ?Diagnosis: Principal Problem: ?  Bipolar depression ([place]) ?Active Problems: ?  Type 2 diabetes mellitus with complication, without long-term current use of insulin ([place]) ?  Essential hypertension ?  Hypothyroidism ?  Alcohol use disorder ?  Bipolar disorder (manic depression) ([place]) ? ?Total Time spent with patient: 30 minutes ? ?Past Psychiatric History: Past history of bipolar depression mood instability and anxiety alcohol abuse ? ?Past Medical History:  ?Past Medical History:  ?Diagnosis Date  ? Adenomatous colon polyp   ? ADHD, adult residual type   ? Anxiety 09/03/2012  ? Adequate for discharge  ? Aortic atherosclerosis ([place])   ? Arthritis   ? Asthma   ? Atypical chest pain   ? Bipolar affective ([place])   ? Bipolar depression ([place])   ? Bipolar II disorder ([place])   ? CAD (coronary artery disease)   ? Carpal tunnel syndrome of right wrist 10/21/2017  ? Cervical disc displacement 03/24/2017  ? Formatting of this note might be different from the original. CERVICAL 5-6, CERVICAL 6-7  ? Chronic pain syndrome   ? Complication of anesthesia   ? has woken up during surgery before   ? Depression 09/03/2012  ? Adequate for discharge  ? Diabetes mellitus without complication ([place])   ? Emphysema/COPD [facility])   ? Essential hypertension 10/09/2021  ? Blood pressure is picking up.  Hypotensive on arrival.  Resume amlodipine today.  May not need to multiple antihypertensives.  ? GERD (gastroesophageal reflux disease)   ? Hepatic steatosis   ? Hypomagnesemia 10/09/2021  ? Replace aggressively today.  Recheck levels tomorrow  including phosphorus and potassium.  ? Hypothyroidism   ? Impotence, organic   ? Irregular heart beat   ? Lumbar radiculopathy, chronic   ? Mixed hyperlipidemia   ? MRSA (methicillin resistant staph aureus) culture positive 08/08/2016  ? Formatting of this note might be different from the original. UPDATE : MRSA RE-SWAB FOR NEXT SURGERY ALSO +MRSA ON 03-24-17  + MRSA NASAL SWAB ON 08-08-16  ? Nodule of lower lobe of right lung   ? Obstructive sleep apnea 10/09/2021  ? Start using CPAP at night.  Wean off oxygen.  ? Pneumonia   ? Postlaminectomy syndrome of lumbosacral region 08/08/2016  ? PTSD (post-traumatic stress disorder)   ? Recurrent major depressive disorder ([place]) 01/30/2018  ? Sciatica 08/08/2016  ? Shock ([place]) 10/07/2021  ? Slurred speech 01/14/2013  ? Spinal stenosis 08/08/2016  ? Stroke [facility])   ? "light" stroke  ? Type 2 diabetes mellitus with complication, without long-term current use of insulin ([place]) 10/09/2021  ? On metformin at home.  Fairly controlled as per patient.  Holding metformin.  Currently on sliding scale insulin.  Resume metformin on discharge.  ? Type 2 diabetes mellitus with diabetic neuropathy ([place])   ? Type 2 diabetes mellitus with hyperlipidemia ([place])   ?  ?Past Surgical History:  ?Procedure Laterality Date  ? BACK SURGERY    ? fusion L5/S1  ? COLONOSCOPY WITH PROPOFOL N/A 09/03/2017  ? Procedure: COLONOSCOPY WITH PROPOFOL;  Surgeon: Laurence Spates, MD;  [facility];  Service: Endoscopy;  Laterality: N/A;  ? HAND SURGERY    ? R & L   ? HERNIA REPAIR    ? umbilical  ? MULTIPLE TOOTH EXTRACTIONS    ? NOSE SURGERY    ? fracture repair  ? TOTAL KNEE ARTHROPLASTY  2012  ? Right  ? TOTAL KNEE ARTHROPLASTY  04/07/2012  ? Procedure: TOTAL KNEE ARTHROPLASTY;  Surgeon: Ninetta Lights, MD;  Location: [place];  Service: Orthopedics;  Laterality: Left;  left total knee arthroplasty  ? ?Family History:  ?Family History  ?Problem Relation Age of Onset  ? Hypertension Mother   ? Diabetes Mother   ?  Hypertension Father   ? ?Family Psychiatric  History: See previous ?Social History:  ?Social History  ? ?Substance and Sexual Activity  ?Alcohol Use Yes  ? Comment: daily  ?   ?Social History  ? ?Substance and Sexual Activity  ?Drug Use No  ?  ?Social History  ? ?Socioeconomic History  ? Marital status: Married  ?  Spouse name: Not on file  ? Number of children: Not on file  ? Years of education: Not on file  ? Highest education level: Not on file  ?Occupational History  ? Not on file  ?Tobacco Use  ? Smoking status: Every Day  ?  Packs/day: 1.00  ?  Types: Cigarettes  ? Smokeless tobacco: Former  ?  Types: Chew  ?Vaping Use  ? Vaping Use: Never used  ?Substance and Sexual Activity  ? Alcohol use: Yes  ?  Comment: daily  ? Drug use: No  ? Sexual activity: Yes  ?Other Topics Concern  ? Not on file  ?Social History Narrative  ? Not on file  ? ?Social Determinants of Health  ? ?Financial Resource Strain: Not on file  ?Food Insecurity: Not on file  ?Transportation Needs: Not on file  ?Physical Activity: Not on file  ?Stress: Not on file  ?Social Connections: Not on file  ? ?Additional Social History:  ?  ?  ?  ?  ?  ?  ?  ?  ?  ?  ?  ? ?Sleep: Fair ? ?Appetite:  Fair ? ?Current Medications: ?Current Facility-Administered Medications  ?Medication Dose Route Frequency Provider Last Rate Last Admin  ? acetaminophen (TYLENOL) tablet 650 mg  650 mg Oral Q6H PRN Freida Busman, MD   650 mg at 12/20/21 0850  ? alum & mag hydroxide-simeth (MAALOX/MYLANTA) 200-200-20 MG/5ML suspension 30 mL  30 mL Oral Q4H PRN Damita Dunnings B, MD      ? amLODipine (NORVASC) tablet 10 mg  10 mg Oral Daily Slyvester Latona, Madie Reno, MD   10 mg at 12/20/21 0853  ? aspirin chewable tablet 81 mg  81 mg Oral Daily Damita Dunnings B, MD   81 mg at 12/20/21 0851  ? [START ON 12/21/2021] cariprazine (VRAYLAR) capsule 3 mg  3 mg Oral Daily Lilla Callejo T, MD      ? divalproex (DEPAKOTE) DR tablet 1,000 mg  1,000 mg Oral BID Moussa Wiegand, Madie Reno, MD   1,000 mg at 12/20/21  0851  ? famotidine (PEPCID) tablet 40 mg  40 mg Oral Daily Damita Dunnings B, MD   40 mg at 12/20/21 0851  ? folic acid (FOLVITE) tablet 1 mg  1 mg Oral QHS Asako Saliba T, MD   1 mg at 12/19/21 2207  ? gabapentin (NEURONTIN) capsule 400 mg  400 mg Oral TID Freida Busman, MD  400 mg at 12/20/21 1226  ? levothyroxine (SYNTHROID) tablet 175 mcg  175 mcg Oral Q0600 Damita Dunnings B, MD   175 mcg at 12/20/21 0615  ? losartan (COZAAR) tablet 100 mg  100 mg Oral Daily Damita Dunnings B, MD   100 mg at 12/20/21 0853  ? magnesium hydroxide (MILK OF MAGNESIA) suspension 30 mL  30 mL Oral Daily PRN Damita Dunnings B, MD      ? magnesium oxide (MAG-OX) tablet 400 mg  400 mg Oral QHS Detria Cummings, Madie Reno, MD   400 mg at 12/19/21 2207  ? QUEtiapine (SEROQUEL) tablet 800 mg  800 mg Oral QHS Vian Fluegel T, MD      ? rosuvastatin (CRESTOR) tablet 5 mg  5 mg Oral Daily Damita Dunnings B, MD   5 mg at 12/20/21 5400  ? thiamine tablet 100 mg  100 mg Oral Daily Damita Dunnings B, MD   100 mg at 12/20/21 8676  ? ? ?Lab Results:  ?Results for orders placed or performed during the hospital encounter of 12/17/21 (from the past 48 hour(s))  ?Glucose, capillary     Status: Abnormal  ? Collection Time: 12/18/21  8:07 PM  ?Result Value Ref Range  ? Glucose-Capillary 107 (H) 70 - 99 mg/dL  ?  Comment: Glucose reference range applies only to samples taken after fasting for at least 8 hours.  ? Comment 1 Notify RN   ? ? ?Blood Alcohol level:  ?Lab Results  ?Component Value Date  ? ETH 125 (H) 12/14/2021  ? ETH 272 (H) 10/07/2021  ? ? ?Metabolic Disorder Labs: ?Lab Results  ?Component Value Date  ? HGBA1C 6.4 (H) 10/08/2021  ? MPG 136.98 10/08/2021  ? MPG 146 (H) 09/02/2012  ? ?No results found for: PROLACTIN ?Lab Results  ?Component Value Date  ? CHOL 166 12/18/2021  ? TRIG 146 12/18/2021  ? HDL 38 (L) 12/18/2021  ? CHOLHDL 4.4 12/18/2021  ? VLDL 29 12/18/2021  ? [place] 99 12/18/2021  ? [place] 90 09/02/2012  ? ? ?Physical Findings: ?AIMS:  , ,  ,  ,     ?CIWA:    ?COWS:    ? ?Musculoskeletal: ?Strength & Muscle Tone: within normal limits ?Gait & Station: normal ?Patient leans: N/A ? ?Psychiatric Specialty Exam: ? ?Presentation  ?General Appearance: Appropr

## 2021-12-20 NOTE — Progress Notes (Signed)
Recreation Therapy Notes ? ?INPATIENT RECREATION TR PLAN ? ?Patient Details ?Name: Rodney Leach ?MRN: [id number] ?DOB: 04/13/1961 ?Today's Date: 12/20/2021 ? ?Rec Therapy Plan ?Is patient appropriate for Therapeutic Recreation?: Yes ?Treatment times per week: at least 3 ?Estimated Length of Stay: 5-7 days ?TR Treatment/Interventions: Group participation (Comment) ? ?Discharge Criteria ?Pt will be discharged from therapy if:: Discharged ?Treatment plan/goals/alternatives discussed and agreed upon by:: Patient/family ? ?Discharge Summary ?  ? ? ?Arryanna Holquin ?12/20/2021, 11:47 AM ?

## 2021-12-20 NOTE — Progress Notes (Signed)
Cooperative with treatment, patient endorses depression. Patient denies SI, HI & AVH. Patient seemed to sleep well through out the night. ?

## 2021-12-20 NOTE — Plan of Care (Signed)
D- Patient alert and oriented. Patient presented in a pleasant mood on assessment stating that he slept "better" last night and had complaints of generalized pain to voice to this Probation officer. Patient rated his pain level an "8/10", in which he did request PRN pain medication, along with his scheduled medications. Patient denied depression, stating "I'm alright right now", however, he did endorse anxiety, rating it a "7/10". Patient reported that the pain is making him feel this way. Patient also denied SI, HI, AVH at this time. Patient's goal for today is "just to rest". ? ?A- Scheduled medications administered to patient, per MD orders. Support and encouragement provided.  Routine safety checks conducted every 15 minutes.  Patient informed to notify staff with problems or concerns. ? ?R- No adverse drug reactions noted. Patient contracts for safety at this time. Patient compliant with medications and treatment plan. Patient receptive, calm, and cooperative. Patient interacts well with others on the unit.  Patient remains safe at this time. ? ?Problem: Education: ?Goal: Knowledge of [facility] Education information/materials will improve ?Outcome: Progressing ?Goal: Emotional status will improve ?Outcome: Progressing ?Goal: Mental status will improve ?Outcome: Progressing ?Goal: Verbalization of understanding the information provided will improve ?Outcome: Progressing ?  ?Problem: Safety: ?Goal: Periods of time without injury will increase ?Outcome: Progressing ?  ?Problem: Physical Regulation: ?Goal: Complications related to the disease process, condition or treatment will be avoided or minimized ?Outcome: Progressing ?  ?Problem: Safety: ?Goal: Ability to remain free from injury will improve ?Outcome: Progressing ?  ?Problem: Coping: ?Goal: Ability to identify and develop effective coping behavior will improve ?Outcome: Progressing ?  ?Problem: Coping: ?Goal: Coping ability will improve ?Outcome:  Progressing ?Goal: Will verbalize feelings ?Outcome: Progressing ?  ?Problem: Health Behavior/Discharge Planning: ?Goal: Compliance with therapeutic regimen will improve ?Outcome: Progressing ?  ?

## 2021-12-20 NOTE — Progress Notes (Signed)
Recreation Therapy Notes ? ?INPATIENT RECREATION THERAPY ASSESSMENT ? ?Patient Details ?Name: Rodney Leach ?MRN: [id number] ?DOB: 1960/10/16 ?Today's Date: 12/20/2021 ?      ?Information Obtained From: ?Patient ? ?Able to Participate in Assessment/Interview: ?Yes ? ?Patient Presentation: ?Responsive ? ?Reason for Admission (Per Patient): ?Active Symptoms ? ?Patient Stressors: ?  ? ?Coping Skills:   ?Substance Abuse, Talk, TV ? ?Leisure Interests (2+):  ?Individual - TV, [place] ? ?Frequency of Recreation/Participation: ?Monthly ? ?Awareness of Community Resources:  ?No ? ?Community Resources:  ?  ? ?Current Use: ?No ? ?If no, Barriers?: ?  ? ?Expressed Interest in [employer] Information: ?Yes ? ?[place] of Residence:  ?[place] ? ?Patient Main Form of Transportation: ?Car ? ?Patient Strengths:  ?N/A ? ?Patient Identified Areas of Improvement:  ?Handle things better ? ?Patient Goal for Hospitalization:  ?Get back on track ? ?Current SI (including self-harm):  ?No ? ?Current HI:  ?No ? ?Current AVH: ?No ? ?Staff Intervention Plan: ?Group Attendance, Collaborate with Interdisciplinary Treatment Team ? ?Consent to Intern Participation: ?N/A ? ?Heydy Montilla ?12/20/2021, 11:47 AM ?

## 2021-12-20 NOTE — Progress Notes (Signed)
Recreation Therapy Notes ? ? ?Date: 12/20/2021 ? ?Time: 10:40 am   ? ?Location: Craft room   ? ?Behavioral response: N/A ?  ?Intervention Topic: Stress Management  ? ?Discussion/Intervention: ?Patient refused to attend group.  ? ?Clinical Observations/Feedback:  ?Patient refused to attend group.  ?  ?Arien Morine LRT/CTRS ? ? ? ? ? ? ? ?Mirah Nevins ?12/20/2021 11:27 AM ?

## 2021-12-20 NOTE — Progress Notes (Signed)
Patient has been resting throughout the day, however, he just came out requesting another blanket and to wash his clothes. Patient was given another blanket and allowed to wash his clothes. Patient remains safe on the unit. ?

## 2021-12-20 NOTE — Group Note (Signed)
[facility] LCSW Group Therapy Note ? ? ?Group Date: 12/20/2021 ?Start Time: 1300 ?End Time: 1400 ? ?Type of Therapy and Topic:  Group Therapy:  Feelings around Relapse and Recovery ? ?Participation Level:  Active  ? ?Description of Group:   ? Patients in this group will discuss emotions they experience before and after a relapse. They will process how experiencing these feelings, or avoidance of experiencing them, relates to having a relapse. Facilitator will guide patients to explore emotions they have related to recovery. Patients will be encouraged to process which emotions are more powerful. They will be guided to discuss the emotional reaction significant others in their lives may have to patients? relapse or recovery. Patients will be assisted in exploring ways to respond to the emotions of others without this contributing to a relapse. ? ?Therapeutic Goals: ?Patient will identify two or more emotions that lead to relapse for them:  ?Patient will identify two emotions that result when they relapse:  ?Patient will identify two emotions related to recovery:  ?Patient will demonstrate ability to communicate their needs through discussion and/or role plays. ? ? ?Summary of Patient Progress: ? ?Patient was present for the entirety of the group session. Patient was an active listener and participated in the topic of discussion, provided helpful advice to others, and added nuance to topic of conversation. Patient states he excessively worries and has chronic pain which causes him to drink.  ? ?Therapeutic Modalities:   ?Cognitive Behavioral Therapy ?Solution-Focused Therapy ?Assertiveness Training ?Relapse Prevention Therapy ? ? ?Durenda Hurt, LCSWA ?

## 2021-12-21 NOTE — Progress Notes (Signed)
[facility] MD Progress Note ? ?12/21/2021 9:34 AM ?[name]  ?MRN:  [id number] ?Subjective:  Pt is calm and cooperative, compliant with medications, denies SI, HI, AVH, States he is looking forward to going home. Patient reports feeling somewhat better today, though still reports moderately depressed mood. He slept well last night, has fair appetite and is denying current SI/HI.  ? ?Principal Problem: Bipolar depression ([place]) ?Diagnosis: Principal Problem: ?  Bipolar depression ([place]) ?Active Problems: ?  Type 2 diabetes mellitus with complication, without long-term current use of insulin ([place]) ?  Essential hypertension ?  Hypothyroidism ?  Alcohol use disorder ?  Bipolar disorder (manic depression) ([place]) ? ?Total Time spent with patient: 20 minutes ? ?Past Psychiatric History: Bipolar D/O ? ?Past Medical History:  ?Past Medical History:  ?Diagnosis Date  ? Adenomatous colon polyp   ? ADHD, adult residual type   ? Anxiety 09/03/2012  ? Adequate for discharge  ? Aortic atherosclerosis ([place])   ? Arthritis   ? Asthma   ? Atypical chest pain   ? Bipolar affective ([place])   ? Bipolar depression ([place])   ? Bipolar II disorder ([place])   ? CAD (coronary artery disease)   ? Carpal tunnel syndrome of right wrist 10/21/2017  ? Cervical disc displacement 03/24/2017  ? Formatting of this note might be different from the original. CERVICAL 5-6, CERVICAL 6-7  ? Chronic pain syndrome   ? Complication of anesthesia   ? has woken up during surgery before   ? Depression 09/03/2012  ? Adequate for discharge  ? Diabetes mellitus without complication ([place])   ? Emphysema/COPD [facility])   ? Essential hypertension 10/09/2021  ? Blood pressure is picking up.  Hypotensive on arrival.  Resume amlodipine today.  May not need to multiple antihypertensives.  ? GERD (gastroesophageal reflux disease)   ? Hepatic steatosis   ? Hypomagnesemia 10/09/2021  ? Replace aggressively today.  Recheck levels tomorrow including phosphorus and potassium.  ? Hypothyroidism   ?  Impotence, organic   ? Irregular heart beat   ? Lumbar radiculopathy, chronic   ? Mixed hyperlipidemia   ? MRSA (methicillin resistant staph aureus) culture positive 08/08/2016  ? Formatting of this note might be different from the original. UPDATE : MRSA RE-SWAB FOR NEXT SURGERY ALSO +MRSA ON 03-24-17  + MRSA NASAL SWAB ON 08-08-16  ? Nodule of lower lobe of right lung   ? Obstructive sleep apnea 10/09/2021  ? Start using CPAP at night.  Wean off oxygen.  ? Pneumonia   ? Postlaminectomy syndrome of lumbosacral region 08/08/2016  ? PTSD (post-traumatic stress disorder)   ? Recurrent major depressive disorder ([place]) 01/30/2018  ? Sciatica 08/08/2016  ? Shock ([place]) 10/07/2021  ? Slurred speech 01/14/2013  ? Spinal stenosis 08/08/2016  ? Stroke [facility])   ? "light" stroke  ? Type 2 diabetes mellitus with complication, without long-term current use of insulin ([place]) 10/09/2021  ? On metformin at home.  Fairly controlled as per patient.  Holding metformin.  Currently on sliding scale insulin.  Resume metformin on discharge.  ? Type 2 diabetes mellitus with diabetic neuropathy ([place])   ? Type 2 diabetes mellitus with hyperlipidemia ([place])   ?  ?Past Surgical History:  ?Procedure Laterality Date  ? BACK SURGERY    ? fusion L5/S1  ? COLONOSCOPY WITH PROPOFOL N/A 09/03/2017  ? Procedure: COLONOSCOPY WITH PROPOFOL;  Surgeon: Laurence Spates, MD;  Location: [facility];  Service: Endoscopy;  Laterality: N/A;  ? HAND SURGERY    ?  R & L   ? HERNIA REPAIR    ? umbilical  ? MULTIPLE TOOTH EXTRACTIONS    ? NOSE SURGERY    ? fracture repair  ? TOTAL KNEE ARTHROPLASTY  2012  ? Right  ? TOTAL KNEE ARTHROPLASTY  04/07/2012  ? Procedure: TOTAL KNEE ARTHROPLASTY;  Surgeon: Ninetta Lights, MD;  Location: [facility];  Service: Orthopedics;  Laterality: Left;  left total knee arthroplasty  ? ?Family History:  ?Family History  ?Problem Relation Age of Onset  ? Hypertension Mother   ? Diabetes Mother   ? Hypertension Father   ? ?Family Psychiatric  History:   ?Social History:  ?Social History  ? ?Substance and Sexual Activity  ?Alcohol Use Yes  ? Comment: daily  ?   ?Social History  ? ?Substance and Sexual Activity  ?Drug Use No  ?  ?Social History  ? ?Socioeconomic History  ? Marital status: Married  ?  Spouse name: Not on file  ? Number of children: Not on file  ? Years of education: Not on file  ? Highest education level: Not on file  ?Occupational History  ? Not on file  ?Tobacco Use  ? Smoking status: Every Day  ?  Packs/day: 1.00  ?  Types: Cigarettes  ? Smokeless tobacco: Former  ?  Types: Chew  ?Vaping Use  ? Vaping Use: Never used  ?Substance and Sexual Activity  ? Alcohol use: Yes  ?  Comment: daily  ? Drug use: No  ? Sexual activity: Yes  ?Other Topics Concern  ? Not on file  ?Social History Narrative  ? Not on file  ? ?Social Determinants of Health  ? ?Financial Resource Strain: Not on file  ?Food Insecurity: Not on file  ?Transportation Needs: Not on file  ?Physical Activity: Not on file  ?Stress: Not on file  ?Social Connections: Not on file  ? ?Additional Social History:  ?  ?  ?  ?  ?  ?  ?  ?  ?  ?  ?  ? ?Sleep: Good ? ?Appetite:  Fair ? ?Current Medications: ?Current Facility-Administered Medications  ?Medication Dose Route Frequency Provider Last Rate Last Admin  ? acetaminophen (TYLENOL) tablet 650 mg  650 mg Oral Q6H PRN Freida Busman, MD   650 mg at 12/20/21 0850  ? alum & mag hydroxide-simeth (MAALOX/MYLANTA) 200-200-20 MG/5ML suspension 30 mL  30 mL Oral Q4H PRN Damita Dunnings B, MD      ? amLODipine (NORVASC) tablet 10 mg  10 mg Oral Daily Clapacs, Madie Reno, MD   10 mg at 12/21/21 0851  ? aspirin chewable tablet 81 mg  81 mg Oral Daily Damita Dunnings B, MD   81 mg at 12/21/21 0850  ? cariprazine (VRAYLAR) capsule 3 mg  3 mg Oral Daily Clapacs, Madie Reno, MD   3 mg at 12/21/21 0851  ? divalproex (DEPAKOTE) DR tablet 1,000 mg  1,000 mg Oral BID Clapacs, Madie Reno, MD   1,000 mg at 12/21/21 6503  ? famotidine (PEPCID) tablet 40 mg  40 mg Oral Daily  Damita Dunnings B, MD   40 mg at 12/21/21 0851  ? folic acid (FOLVITE) tablet 1 mg  1 mg Oral QHS Clapacs, John T, MD   1 mg at 12/20/21 2136  ? gabapentin (NEURONTIN) capsule 400 mg  400 mg Oral TID Damita Dunnings B, MD   400 mg at 12/21/21 0851  ? levothyroxine (SYNTHROID) tablet 175 mcg  175 mcg Oral Q0600  Freida Busman, MD   175 mcg at 12/21/21 1829  ? losartan (COZAAR) tablet 100 mg  100 mg Oral Daily Damita Dunnings B, MD   100 mg at 12/21/21 9371  ? magnesium hydroxide (MILK OF MAGNESIA) suspension 30 mL  30 mL Oral Daily PRN Damita Dunnings B, MD      ? magnesium oxide (MAG-OX) tablet 400 mg  400 mg Oral QHS Clapacs, Madie Reno, MD   400 mg at 12/20/21 2136  ? metoprolol succinate (TOPROL-XL) 24 hr tablet 25 mg  25 mg Oral Daily Clapacs, Madie Reno, MD   25 mg at 12/21/21 6967  ? QUEtiapine (SEROQUEL) tablet 800 mg  800 mg Oral QHS Clapacs, John T, MD   800 mg at 12/20/21 2136  ? rosuvastatin (CRESTOR) tablet 5 mg  5 mg Oral Daily Damita Dunnings B, MD   5 mg at 12/21/21 8938  ? thiamine tablet 100 mg  100 mg Oral Daily Damita Dunnings B, MD   100 mg at 12/21/21 1017  ? ? ?Lab Results: No results found for this or any previous visit (from the past 48 hour(s)). ? ?Blood Alcohol level:  ?Lab Results  ?Component Value Date  ? ETH 125 (H) 12/14/2021  ? ETH 272 (H) 10/07/2021  ? ? ?Metabolic Disorder Labs: ?Lab Results  ?Component Value Date  ? HGBA1C 6.4 (H) 10/08/2021  ? MPG 136.98 10/08/2021  ? MPG 146 (H) 09/02/2012  ? ?No results found for: PROLACTIN ?Lab Results  ?Component Value Date  ? CHOL 166 12/18/2021  ? TRIG 146 12/18/2021  ? HDL 38 (L) 12/18/2021  ? CHOLHDL 4.4 12/18/2021  ? VLDL 29 12/18/2021  ? [place] 99 12/18/2021  ? [address] 90 09/02/2012  ? ? ?Physical Findings: ?AIMS:  , ,  ,  ,    ?CIWA:    ?COWS:    ? ?Musculoskeletal: ?Strength & Muscle Tone: within normal limits ?Gait & Station: normal ?Patient leans: Front ? ?Psychiatric Specialty Exam: ? ?Presentation  ?General Appearance: Appropriate for  Environment ? ?Eye Contact:Fair ? ?Speech:Clear and Coherent ? ?Speech Volume:Normal ? ?Handedness:Right ? ? ?Mood and Affect  ?Mood:Dysphoric ? ?Affect:Congruent ? ? ?Thought Process  ?Thought Processes:Coherent; Goal Directed ? ?

## 2021-12-21 NOTE — Plan of Care (Signed)
D- Patient alert and oriented. Patient presented in a pleasant mood on assessment stating that he slept "alright" last night, "I was up and down". Patient did report that when he first woke up "I couldn't remember where I was at, but this too shall pass". Patient continues to endorse chronic, generalized pain, rating it a "7/10". Patient did not request any PRN pain medication from this writer. Patient also reported both anxiety and depression, stating that it's "nothing new" from yesterday, in which he reported that his chronic pain is the reason he's feeling this way. Patient denied SI, HI, AVH to this Probation officer. Per his self-inventory, patient's goal for today is "meds", in which he will "talk to doctor", in order to achieve his goal. ? ?A- Scheduled medications administered to patient, per MD orders. Support and encouragement provided.  Routine safety checks conducted every 15 minutes.  Patient informed to notify staff with problems or concerns. ? ?R- No adverse drug reactions noted. Patient contracts for safety at this time. Patient compliant with medications and treatment plan. Patient receptive, calm, and cooperative. Patient interacts well with others on the unit.  Patient remains safe at this time. ? ?Problem: Education: ?Goal: Knowledge of [place] [facility] Education information/materials will improve ?Outcome: Progressing ?Goal: Emotional status will improve ?Outcome: Progressing ?Goal: Mental status will improve ?Outcome: Progressing ?Goal: Verbalization of understanding the information provided will improve ?Outcome: Progressing ?  ?Problem: Safety: ?Goal: Periods of time without injury will increase ?Outcome: Progressing ?  ?Problem: Physical Regulation: ?Goal: Complications related to the disease process, condition or treatment will be avoided or minimized ?Outcome: Progressing ?  ?Problem: Safety: ?Goal: Ability to remain free from injury will improve ?Outcome: Progressing ?  ?Problem: Coping: ?Goal:  Ability to identify and develop effective coping behavior will improve ?Outcome: Progressing ?  ?Problem: Coping: ?Goal: Coping ability will improve ?Outcome: Progressing ?Goal: Will verbalize feelings ?Outcome: Progressing ?  ?Problem: Health Behavior/Discharge Planning: ?Goal: Compliance with therapeutic regimen will improve ?Outcome: Progressing ?  ?

## 2021-12-21 NOTE — Plan of Care (Signed)
Pt is calm and cooperative, compliant with medications, denies SI, HI, AVH, States he is looking forward to going home.   ?

## 2021-12-21 NOTE — Group Note (Signed)
[facility] LCSW Group Therapy Note ? ? ?Group Date: 12/21/2021 ?Start Time: 1300 ?End Time: 1400 ? ? ?Type of Therapy/Topic:  Group Therapy:  Balance in Life ? ?Participation Level:  Active  ? ?Description of Group:   ? This group will address the concept of balance and how it feels and looks when one is unbalanced. Patients will be encouraged to process areas in their lives that are out of balance, and identify reasons for remaining unbalanced. Facilitators will guide patients utilizing problem- solving interventions to address and correct the stressor making their life unbalanced. Understanding and applying boundaries will be explored and addressed for obtaining  and maintaining a balanced life. Patients will be encouraged to explore ways to assertively make their unbalanced needs known to significant others in their lives, using other group members and facilitator for support and feedback. ? ?Therapeutic Goals: ?Patient will identify two or more emotions or situations they have that consume much of in their lives. ?Patient will identify signs/triggers that life has become out of balance:  ?Patient will identify two ways to set boundaries in order to achieve balance in their lives:  ?Patient will demonstrate ability to communicate their needs through discussion and/or role plays ? ?Summary of Patient Progress:  Patient presented on time to group and actively participated in topic of discussion. Patient identified that he needs structure and achieves this by making lists each day to complete daily tasks. Patient shared how he feels overwhelmed by keeping in touch with all of his family members, stating that he has a large family and he feels he never has enough time for himself. Moving forward, patient stated he would like to put up boundaries between people. Patient was receptive to discussion about assertive communication in order to establish healthy boundaries.  ? ? ?Therapeutic Modalities:   ?Cognitive Behavioral  Therapy ?Solution-Focused Therapy ?Assertiveness Training ? ? ?Kenna Gilbert Thaddeus Evitts, LCSWA ?

## 2021-12-21 NOTE — Progress Notes (Signed)
Patient has rested on and off throughout the day and he has also been present for social work group. Patient has been observed in the dayroom, watching television at times. During evening med pass, patient stated to this writer that after he received his medication, he was going back to his room to lay down. This Probation officer told patient that this was fine, being that there was nothing else that I needed from him.  ?

## 2021-12-22 MED ORDER — PAROXETINE HCL 20 MG PO TABS
20.0000 mg | ORAL_TABLET | Freq: Every day | ORAL | Status: DC
  Administered 2021-12-22 – 2021-12-26 (×5): 20 mg via ORAL
  Filled 2021-12-22 (×5): qty 1

## 2021-12-22 NOTE — Progress Notes (Signed)
Patient was cooperative with treatment on shift, he denies SI,HI & AVH depression. He was pleasant when he was engaged in conversation with current Probation officer. No new issues to report on shift at this time. ?

## 2021-12-22 NOTE — Progress Notes (Signed)
[facility] MD Progress Note ? ?12/22/2021 8:33 AM ?Rodney Leach  ?MRN:  [id number] ?Subjective:  Patient alert  and oriented x 4 affect is flat but brightens upon approach his thoughts are organized and coherent no distress noted interacting appropriately with peers and staff. Patient currently denies SI/HI/AVH and he was complaint with medication.  ? Patient continues to feel significantly depressed and anxious and does not really feel ready for DC yet. He reports having been on an antidepressant in the past and wants to get back on one now.  ?Principal Problem: Bipolar depression ([place]) ?Diagnosis: Principal Problem: ?  Bipolar depression ([place]) ?Active Problems: ?  Type 2 diabetes mellitus with complication, without long-term current use of insulin ([place]) ?  Essential hypertension ?  Hypothyroidism ?  Alcohol use disorder ?  Bipolar disorder (manic depression) ([place]) ? ?Total Time spent with patient: 20 minutes ? ?Past Psychiatric History:  Bipolar D/O ? ?Past Medical History:  ?Past Medical History:  ?Diagnosis Date  ? Adenomatous colon polyp   ? ADHD, adult residual type   ? Anxiety 09/03/2012  ? Adequate for discharge  ? Aortic atherosclerosis ([place])   ? Arthritis   ? Asthma   ? Atypical chest pain   ? Bipolar affective ([place])   ? Bipolar depression ([place])   ? Bipolar II disorder ([place])   ? CAD (coronary artery disease)   ? Carpal tunnel syndrome of right wrist 10/21/2017  ? Cervical disc displacement 03/24/2017  ? Formatting of this note might be different from the original. CERVICAL 5-6, CERVICAL 6-7  ? Chronic pain syndrome   ? Complication of anesthesia   ? has woken up during surgery before   ? Depression 09/03/2012  ? Adequate for discharge  ? Diabetes mellitus without complication ([place])   ? Emphysema/COPD [facility])   ? Essential hypertension 10/09/2021  ? Blood pressure is picking up.  Hypotensive on arrival.  Resume amlodipine today.  May not need to multiple antihypertensives.  ? GERD (gastroesophageal reflux disease)   ?  Hepatic steatosis   ? Hypomagnesemia 10/09/2021  ? Replace aggressively today.  Recheck levels tomorrow including phosphorus and potassium.  ? Hypothyroidism   ? Impotence, organic   ? Irregular heart beat   ? Lumbar radiculopathy, chronic   ? Mixed hyperlipidemia   ? MRSA (methicillin resistant staph aureus) culture positive 08/08/2016  ? Formatting of this note might be different from the original. UPDATE : MRSA RE-SWAB FOR NEXT SURGERY ALSO +MRSA ON 03-24-17  + MRSA NASAL SWAB ON 08-08-16  ? Nodule of lower lobe of right lung   ? Obstructive sleep apnea 10/09/2021  ? Start using CPAP at night.  Wean off oxygen.  ? Pneumonia   ? Postlaminectomy syndrome of lumbosacral region 08/08/2016  ? PTSD (post-traumatic stress disorder)   ? Recurrent major depressive disorder ([place]) 01/30/2018  ? Sciatica 08/08/2016  ? Shock ([place]) 10/07/2021  ? Slurred speech 01/14/2013  ? Spinal stenosis 08/08/2016  ? Stroke [facility])   ? "light" stroke  ? Type 2 diabetes mellitus with complication, without long-term current use of insulin ([place]) 10/09/2021  ? On metformin at home.  Fairly controlled as per patient.  Holding metformin.  Currently on sliding scale insulin.  Resume metformin on discharge.  ? Type 2 diabetes mellitus with diabetic neuropathy ([place])   ? Type 2 diabetes mellitus with hyperlipidemia ([place])   ?  ?Past Surgical History:  ?Procedure Laterality Date  ? BACK SURGERY    ? fusion L5/S1  ?  COLONOSCOPY WITH PROPOFOL N/A 09/03/2017  ? Procedure: COLONOSCOPY WITH PROPOFOL;  Surgeon: Laurence Spates, MD;  Location: [place];  Service: Endoscopy;  Laterality: N/A;  ? HAND SURGERY    ? R & L   ? HERNIA REPAIR    ? umbilical  ? MULTIPLE TOOTH EXTRACTIONS    ? NOSE SURGERY    ? fracture repair  ? TOTAL KNEE ARTHROPLASTY  2012  ? Right  ? TOTAL KNEE ARTHROPLASTY  04/07/2012  ? Procedure: TOTAL KNEE ARTHROPLASTY;  Surgeon: Ninetta Lights, MD;  Location: [place];  Service: Orthopedics;  Laterality: Left;  left total knee arthroplasty  ? ?Family  History:  ?Family History  ?Problem Relation Age of Onset  ? Hypertension Mother   ? Diabetes Mother   ? Hypertension Father   ? ?Family Psychiatric  History:  ?Social History:  ?Social History  ? ?Substance and Sexual Activity  ?Alcohol Use Yes  ? Comment: daily  ?   ?Social History  ? ?Substance and Sexual Activity  ?Drug Use No  ?  ?Social History  ? ?Socioeconomic History  ? Marital status: Married  ?  Spouse name: Not on file  ? Number of children: Not on file  ? Years of education: Not on file  ? Highest education level: Not on file  ?Occupational History  ? Not on file  ?Tobacco Use  ? Smoking status: Every Day  ?  Packs/day: 1.00  ?  Types: Cigarettes  ? Smokeless tobacco: Former  ?  Types: Chew  ?Vaping Use  ? Vaping Use: Never used  ?Substance and Sexual Activity  ? Alcohol use: Yes  ?  Comment: daily  ? Drug use: No  ? Sexual activity: Yes  ?Other Topics Concern  ? Not on file  ?Social History Narrative  ? Not on file  ? ?Social Determinants of Health  ? ?Financial Resource Strain: Not on file  ?Food Insecurity: Not on file  ?Transportation Needs: Not on file  ?Physical Activity: Not on file  ?Stress: Not on file  ?Social Connections: Not on file  ? ?Additional Social History:  ?  ?  ?  ?  ?  ?  ?  ?  ?  ?  ?  ? ?Sleep: Fair ? ?Appetite:  Fair ? ?Current Medications: ?Current Facility-Administered Medications  ?Medication Dose Route Frequency Provider Last Rate Last Admin  ? acetaminophen (TYLENOL) tablet 650 mg  650 mg Oral Q6H PRN Freida Busman, MD   650 mg at 12/20/21 0850  ? alum & mag hydroxide-simeth (MAALOX/MYLANTA) 200-200-20 MG/5ML suspension 30 mL  30 mL Oral Q4H PRN Damita Dunnings B, MD      ? amLODipine (NORVASC) tablet 10 mg  10 mg Oral Daily Clapacs, Madie Reno, MD   10 mg at 12/22/21 0737  ? aspirin chewable tablet 81 mg  81 mg Oral Daily Damita Dunnings B, MD   81 mg at 12/22/21 0820  ? cariprazine (VRAYLAR) capsule 3 mg  3 mg Oral Daily Clapacs, Madie Reno, MD   3 mg at 12/22/21 1062  ? divalproex  (DEPAKOTE) DR tablet 1,000 mg  1,000 mg Oral BID Clapacs, Madie Reno, MD   1,000 mg at 12/22/21 0819  ? famotidine (PEPCID) tablet 40 mg  40 mg Oral Daily Damita Dunnings B, MD   40 mg at 12/22/21 0819  ? folic acid (FOLVITE) tablet 1 mg  1 mg Oral QHS Clapacs, Madie Reno, MD   1 mg at 12/21/21 2108  ?  gabapentin (NEURONTIN) capsule 400 mg  400 mg Oral TID Damita Dunnings B, MD   400 mg at 12/22/21 0819  ? levothyroxine (SYNTHROID) tablet 175 mcg  175 mcg Oral Q0600 Freida Busman, MD   175 mcg at 12/22/21 0651  ? losartan (COZAAR) tablet 100 mg  100 mg Oral Daily Damita Dunnings B, MD   100 mg at 12/22/21 5176  ? magnesium hydroxide (MILK OF MAGNESIA) suspension 30 mL  30 mL Oral Daily PRN Damita Dunnings B, MD      ? magnesium oxide (MAG-OX) tablet 400 mg  400 mg Oral QHS Clapacs, Madie Reno, MD   400 mg at 12/21/21 2107  ? metoprolol succinate (TOPROL-XL) 24 hr tablet 25 mg  25 mg Oral Daily Clapacs, Madie Reno, MD   25 mg at 12/22/21 0819  ? QUEtiapine (SEROQUEL) tablet 800 mg  800 mg Oral QHS Clapacs, John T, MD   800 mg at 12/21/21 2107  ? rosuvastatin (CRESTOR) tablet 5 mg  5 mg Oral Daily Damita Dunnings B, MD   5 mg at 12/22/21 0819  ? thiamine tablet 100 mg  100 mg Oral Daily Damita Dunnings B, MD   100 mg at 12/22/21 0820  ? ? ?Lab Results: No results found for this or any previous visit (from the past 48 hour(s)). ? ?Blood Alcohol level:  ?Lab Results  ?Component Value Date  ? ETH 125 (H) 12/14/2021  ? ETH 272 (H) 10/07/2021  ? ? ?Metabolic Disorder Labs: ?Lab Results  ?Component Value Date  ? HGBA1C 6.4 (H) 10/08/2021  ? MPG 136.98 10/08/2021  ? MPG 146 (H) 09/02/2012  ? ?No results found for: PROLACTIN ?Lab Results  ?Component Value Date  ? CHOL 166 12/18/2021  ? TRIG 146 12/18/2021  ? HDL 38 (L) 12/18/2021  ? CHOLHDL 4.4 12/18/2021  ? VLDL 29 12/18/2021  ? [place] 99 12/18/2021  ? [place] 90 09/02/2012  ? ? ?Physical Findings: ?AIMS:  , ,  ,  ,    ?CIWA:    ?COWS:    ? ?Musculoskeletal: ?Strength & Muscle Tone: within normal  limits ?Gait & Station: normal ?Patient leans: Front ? ?Psychiatric Specialty Exam: ? ?Presentation  ?General Appearance: Appropriate for Environment; Casual ? ?Eye Contact:Fair ? ?Speech:Normal Rate ? ?[place]

## 2021-12-22 NOTE — Plan of Care (Signed)
Met with PT in medication room. NO signs of distress, injury noted at this time. PT denies SI / HI / AVH. No needs verbalized. Denies any anxiety or depression. Pt is seen ambulating in milieu. Staff will continue Q15 safety rounds.  ? ? ?Problem: Education: ?Goal: Knowledge of [facility] Education information/materials will improve ?Outcome: Progressing ?Goal: Emotional status will improve ?Outcome: Progressing ?Goal: Mental status will improve ?Outcome: Progressing ?Goal: Verbalization of understanding the information provided will improve ?Outcome: Progressing ?  ?

## 2021-12-22 NOTE — Progress Notes (Signed)
Patient alert  and oriented x 4 affect is flat but brightens upon approach his thoughts are organized and coherent no distress noted interacting appropriately with peers and staff. Patient currently denies SI/HI/AVH and he was complaint with medication regimen. 15 minutes safety checks maintained will continue to monitor closely.   ?

## 2021-12-22 NOTE — Group Note (Signed)
LCSW Group Therapy Note ? ?Group Date: 12/22/2021 ?Start Time: 8592 ?End Time: 9244 ? ? ?Type of Therapy and Topic:  Group Therapy - Healthy vs Unhealthy Coping Skills ? ?Participation Level:  Did Not Attend  ? ?Description of Group ?The focus of this group was to determine what unhealthy coping techniques typically are used by group members and what healthy coping techniques would be helpful in coping with various problems. Patients were guided in becoming aware of the differences between healthy and unhealthy coping techniques. Patients were asked to identify 2-3 healthy coping skills they would like to learn to use more effectively. ? ?Therapeutic Goals ?Patients learned that coping is what human beings do all day long to deal with various situations in their lives ?Patients defined and discussed healthy vs unhealthy coping techniques ?Patients identified their preferred coping techniques and identified whether these were healthy or unhealthy ?Patients determined 2-3 healthy coping skills they would like to become more familiar with and use more often. ?Patients provided support and ideas to each other ? ? ?Summary of Patient Progress:  Due to influx of patients on the units, group was not held.  ? ? ?Therapeutic Modalities ?Cognitive Behavioral Therapy ?Motivational Interviewing ? ?Kenna Gilbert Shenee Wignall, LCSWA ?12/22/2021  2:56 PM   ?

## 2021-12-23 DIAGNOSIS — F319 Bipolar disorder, unspecified: Secondary | ICD-10-CM | POA: Diagnosis not present

## 2021-12-23 MED ORDER — CARIPRAZINE HCL 1.5 MG PO CAPS
4.5000 mg | ORAL_CAPSULE | Freq: Every day | ORAL | Status: DC
  Administered 2021-12-24 – 2021-12-26 (×3): 4.5 mg via ORAL
  Filled 2021-12-23 (×3): qty 3

## 2021-12-23 NOTE — Plan of Care (Signed)
Patient verbalized he could not sleep last night and his anxiety id high today with no reason. Patient stayed in bed most of the shift. Denies SI,Hi  and AVH. Appropriate with staff & peers. Appetite good. Compliant with medications. Support and encouragement given. ?

## 2021-12-23 NOTE — Plan of Care (Signed)
?  Problem: Safety: Goal: Periods of time without injury will increase Outcome: Progressing   

## 2021-12-23 NOTE — BH IP Treatment Plan (Addendum)
Interdisciplinary Treatment and Diagnostic Plan Update ? ?12/23/2021 ?Time of Session: 0930 ?Rodney Leach ?MRN: [id number] ? ?Principal Diagnosis: Bipolar depression ([place]) ? ?Secondary Diagnoses: Principal Problem: ?  Bipolar depression ([place]) ?Active Problems: ?  Type 2 diabetes mellitus with complication, without long-term current use of insulin ([place]) ?  Essential hypertension ?  Hypothyroidism ?  Alcohol use disorder ?  Bipolar disorder (manic depression) ([place]) ? ? ?Current Medications:  ?Current Facility-Administered Medications  ?Medication Dose Route Frequency Provider Last Rate Last Admin  ? acetaminophen (TYLENOL) tablet 650 mg  650 mg Oral Q6H PRN Freida Busman, MD   650 mg at 12/20/21 0850  ? alum & mag hydroxide-simeth (MAALOX/MYLANTA) 200-200-20 MG/5ML suspension 30 mL  30 mL Oral Q4H PRN Damita Dunnings B, MD      ? amLODipine (NORVASC) tablet 10 mg  10 mg Oral Daily Clapacs, Madie Reno, MD   10 mg at 12/23/21 9381  ? aspirin chewable tablet 81 mg  81 mg Oral Daily Damita Dunnings B, MD   81 mg at 12/23/21 0175  ? cariprazine (VRAYLAR) capsule 3 mg  3 mg Oral Daily Clapacs, Madie Reno, MD   3 mg at 12/23/21 1025  ? divalproex (DEPAKOTE) DR tablet 1,000 mg  1,000 mg Oral BID Clapacs, Madie Reno, MD   1,000 mg at 12/23/21 8527  ? famotidine (PEPCID) tablet 40 mg  40 mg Oral Daily Damita Dunnings B, MD   40 mg at 12/23/21 7824  ? folic acid (FOLVITE) tablet 1 mg  1 mg Oral QHS Clapacs, John T, MD   1 mg at 12/22/21 2112  ? gabapentin (NEURONTIN) capsule 400 mg  400 mg Oral TID Damita Dunnings B, MD   400 mg at 12/23/21 2353  ? levothyroxine (SYNTHROID) tablet 175 mcg  175 mcg Oral Q0600 Damita Dunnings B, MD   175 mcg at 12/23/21 6144  ? losartan (COZAAR) tablet 100 mg  100 mg Oral Daily Damita Dunnings B, MD   100 mg at 12/23/21 3154  ? magnesium hydroxide (MILK OF MAGNESIA) suspension 30 mL  30 mL Oral Daily PRN Damita Dunnings B, MD      ? magnesium oxide (MAG-OX) tablet 400 mg  400 mg Oral QHS Clapacs, John T, MD   400 mg at  12/22/21 2112  ? metoprolol succinate (TOPROL-XL) 24 hr tablet 25 mg  25 mg Oral Daily Clapacs, Madie Reno, MD   25 mg at 12/23/21 0086  ? PARoxetine (PAXIL) tablet 20 mg  20 mg Oral Daily Malik, Kashif   20 mg at 12/23/21 7619  ? QUEtiapine (SEROQUEL) tablet 800 mg  800 mg Oral QHS Clapacs, John T, MD   800 mg at 12/22/21 2112  ? rosuvastatin (CRESTOR) tablet 5 mg  5 mg Oral Daily Damita Dunnings B, MD   5 mg at 12/23/21 0825  ? thiamine tablet 100 mg  100 mg Oral Daily Damita Dunnings B, MD   100 mg at 12/23/21 5093  ? ?PTA Medications: ?Medications Prior to Admission  ?Medication Sig Dispense Refill Last Dose  ? albuterol (VENTOLIN HFA) 108 (90 Base) MCG/ACT inhaler Inhale 2 puffs into the lungs every 6 (six) hours as needed for shortness of breath.     ? amLODipine (NORVASC) 5 MG tablet Take 5 mg by mouth daily.     ? aspirin 81 MG EC tablet Take 81 mg by mouth daily.     ? divalproex (DEPAKOTE ER) 500 MG 24 hr tablet Take 1,000 mg by  mouth at bedtime.     ? gabapentin (NEURONTIN) 400 MG capsule Take 400-800 mg by mouth See admin instructions. 800 mg in morning, 400 mg in evening     ? ibuprofen (ADVIL) 800 MG tablet Take 800 mg by mouth every 8 (eight) hours as needed for mild pain.     ? levothyroxine (SYNTHROID, LEVOTHROID) 175 MCG tablet Take 1 tablet (175 mcg total) by mouth daily. 30 tablet 0   ? losartan (COZAAR) 100 MG tablet Take 100 mg by mouth daily.     ? metFORMIN (GLUCOPHAGE) 1000 MG tablet Take 1,000 mg by mouth 2 (two) times daily.     ? QUEtiapine (SEROQUEL) 300 MG tablet Take 600 mg by mouth at bedtime.     ? sertraline (ZOLOFT) 25 MG tablet Take 25 mg by mouth daily.     ? thiamine 100 MG tablet Take 1 tablet (100 mg total) by mouth daily. (Patient not taking: Reported on 12/15/2021)     ? ? ?Patient Stressors: Financial difficulties   ?Substance abuse   ?Other: Pain   ? ?Patient Strengths: Ability for insight  ?Capable of independent living  ?Communication skills  ?General fund of knowledge   ?Motivation for treatment/growth  ?Supportive family/friends  ? ?Treatment Modalities: Medication Management, Group therapy, Case management,  ?1 to 1 session with clinician, Psychoeducation, Recreational therapy. ? ? ?Physician Treatment Plan for Primary Diagnosis: Bipolar depression ([place]) ?Long Term Goal(s): Improvement in symptoms so as ready for discharge  ? ?Short Term Goals: Ability to maintain clinical measurements within normal limits will improve ?Compliance with prescribed medications will improve ?Ability to verbalize feelings will improve ?Ability to disclose and discuss suicidal ideas ? ?Medication Management: Evaluate patient's response, side effects, and tolerance of medication regimen. ? ?Therapeutic Interventions: 1 to 1 sessions, Unit Group sessions and Medication administration. ? ?Evaluation of Outcomes: Progressing ? ?Physician Treatment Plan for Secondary Diagnosis: Principal Problem: ?  Bipolar depression ([place]) ?Active Problems: ?  Type 2 diabetes mellitus with complication, without long-term current use of insulin ([place]) ?  Essential hypertension ?  Hypothyroidism ?  Alcohol use disorder ?  Bipolar disorder (manic depression) ([place]) ? ?Long Term Goal(s): Improvement in symptoms so as ready for discharge  ? ?Short Term Goals: Ability to maintain clinical measurements within normal limits will improve ?Compliance with prescribed medications will improve ?Ability to verbalize feelings will improve ?Ability to disclose and discuss suicidal ideas    ? ?Medication Management: Evaluate patient's response, side effects, and tolerance of medication regimen. ? ?Therapeutic Interventions: 1 to 1 sessions, Unit Group sessions and Medication administration. ? ?Evaluation of Outcomes: Progressing ? ? ?RN Treatment Plan for Primary Diagnosis: Bipolar depression ([place]) ?Long Term Goal(s): Knowledge of disease and therapeutic regimen to maintain health will improve ? ?Short Term Goals: Ability to remain free  from injury will improve, Ability to verbalize frustration and anger appropriately will improve, Ability to demonstrate self-control, Ability to participate in decision making will improve, Ability to verbalize feelings will improve, Ability to disclose and discuss suicidal ideas, Ability to identify and develop effective coping behaviors will improve, and Compliance with prescribed medications will improve ? ?Medication Management: RN will administer medications as ordered by provider, will assess and evaluate patient's response and provide education to patient for prescribed medication. RN will report any adverse and/or side effects to prescribing provider. ? ?Therapeutic Interventions: 1 on 1 counseling sessions, Psychoeducation, Medication administration, Evaluate responses to treatment, Monitor vital signs and CBGs as ordered, Perform/monitor CIWA, COWS,  AIMS and Fall Risk screenings as ordered, Perform wound care treatments as ordered. ? ?Evaluation of Outcomes: Progressing ? ? ?LCSW Treatment Plan for Primary Diagnosis: Bipolar depression ([place]) ?Long Term Goal(s): Safe transition to appropriate next level of care at discharge, Engage patient in therapeutic group addressing interpersonal concerns. ? ?Short Term Goals: Engage patient in aftercare planning with referrals and resources, Increase social support, Increase ability to appropriately verbalize feelings, Increase emotional regulation, Facilitate acceptance of mental health diagnosis and concerns, Facilitate patient progression through stages of change regarding substance use diagnoses and concerns, Identify triggers associated with mental health/substance abuse issues, and Increase skills for wellness and recovery ? ?Therapeutic Interventions: Assess for all discharge needs, 1 to 1 time with Education officer, museum, Explore available resources and support systems, Assess for adequacy in community support network, Educate family and significant other(s) on suicide  prevention, Complete Psychosocial Assessment, Interpersonal group therapy. ? ?Evaluation of Outcomes: Progressing ? ? ?Progress in Treatment: ?Attending groups: Yes. ?Participating in groups: Yes. ?Taking medicat

## 2021-12-23 NOTE — Progress Notes (Signed)
[facility] MD Progress Note ? ?12/23/2021 4:02 PM ?Rodney Leach  ?MRN:  [id number] ?Subjective: Patient seen for follow-up.  Also spoke with his wife on the telephone.  This 61 year old man with bipolar disorder depressed.  Patient says he is not feeling very good today.  He had a momentary increase in his mood over the weekend but has slipped back into feeling depressed most of the time.  Denies suicidal ideation.  Denies psychotic symptoms.  Some nights he sleeps much better than others. ?Principal Problem: Bipolar depression ([place]) ?Diagnosis: Principal Problem: ?  Bipolar depression ([place]) ?Active Problems: ?  Type 2 diabetes mellitus with complication, without long-term current use of insulin ([place]) ?  Essential hypertension ?  Hypothyroidism ?  Alcohol use disorder ?  Bipolar disorder (manic depression) ([place]) ? ?Total Time spent with patient: 30 minutes ? ?Past Psychiatric History: Past history of bipolar disorder and alcohol abuse ? ?Past Medical History:  ?Past Medical History:  ?Diagnosis Date  ? Adenomatous colon polyp   ? ADHD, adult residual type   ? Anxiety 09/03/2012  ? Adequate for discharge  ? Aortic atherosclerosis ([place])   ? Arthritis   ? Asthma   ? Atypical chest pain   ? Bipolar affective ([place])   ? Bipolar depression ([place])   ? Bipolar II disorder ([place])   ? CAD (coronary artery disease)   ? Carpal tunnel syndrome of right wrist 10/21/2017  ? Cervical disc displacement 03/24/2017  ? Formatting of this note might be different from the original. CERVICAL 5-6, CERVICAL 6-7  ? Chronic pain syndrome   ? Complication of anesthesia   ? has woken up during surgery before   ? Depression 09/03/2012  ? Adequate for discharge  ? Diabetes mellitus without complication ([place])   ? Emphysema/COPD [facility])   ? Essential hypertension 10/09/2021  ? Blood pressure is picking up.  Hypotensive on arrival.  Resume amlodipine today.  May not need to multiple antihypertensives.  ? GERD (gastroesophageal reflux disease)   ? Hepatic steatosis    ? Hypomagnesemia 10/09/2021  ? Replace aggressively today.  Recheck levels tomorrow including phosphorus and potassium.  ? Hypothyroidism   ? Impotence, organic   ? Irregular heart beat   ? Lumbar radiculopathy, chronic   ? Mixed hyperlipidemia   ? MRSA (methicillin resistant staph aureus) culture positive 08/08/2016  ? Formatting of this note might be different from the original. UPDATE : MRSA RE-SWAB FOR NEXT SURGERY ALSO +MRSA ON 03-24-17  + MRSA NASAL SWAB ON 08-08-16  ? Nodule of lower lobe of right lung   ? Obstructive sleep apnea 10/09/2021  ? Start using CPAP at night.  Wean off oxygen.  ? Pneumonia   ? Postlaminectomy syndrome of lumbosacral region 08/08/2016  ? PTSD (post-traumatic stress disorder)   ? Recurrent major depressive disorder ([place]) 01/30/2018  ? Sciatica 08/08/2016  ? Shock ([place]) 10/07/2021  ? Slurred speech 01/14/2013  ? Spinal stenosis 08/08/2016  ? Stroke [facility])   ? "light" stroke  ? Type 2 diabetes mellitus with complication, without long-term current use of insulin ([place]) 10/09/2021  ? On metformin at home.  Fairly controlled as per patient.  Holding metformin.  Currently on sliding scale insulin.  Resume metformin on discharge.  ? Type 2 diabetes mellitus with diabetic neuropathy ([place])   ? Type 2 diabetes mellitus with hyperlipidemia ([place])   ?  ?Past Surgical History:  ?Procedure Laterality Date  ? BACK SURGERY    ? fusion L5/S1  ? COLONOSCOPY  WITH PROPOFOL N/A 09/03/2017  ? Procedure: COLONOSCOPY WITH PROPOFOL;  Surgeon: Laurence Spates, MD;  Location: [employer];  Service: Endoscopy;  Laterality: N/A;  ? HAND SURGERY    ? R & L   ? HERNIA REPAIR    ? umbilical  ? MULTIPLE TOOTH EXTRACTIONS    ? NOSE SURGERY    ? fracture repair  ? TOTAL KNEE ARTHROPLASTY  2012  ? Right  ? TOTAL KNEE ARTHROPLASTY  04/07/2012  ? Procedure: TOTAL KNEE ARTHROPLASTY;  Surgeon: Ninetta Lights, MD;  Location: [place];  Service: Orthopedics;  Laterality: Left;  left total knee arthroplasty  ? ?Family History:  ?Family  History  ?Problem Relation Age of Onset  ? Hypertension Mother   ? Diabetes Mother   ? Hypertension Father   ? ?Family Psychiatric  History: See previous ?Social History:  ?Social History  ? ?Substance and Sexual Activity  ?Alcohol Use Yes  ? Comment: daily  ?   ?Social History  ? ?Substance and Sexual Activity  ?Drug Use No  ?  ?Social History  ? ?Socioeconomic History  ? Marital status: Married  ?  Spouse name: Not on file  ? Number of children: Not on file  ? Years of education: Not on file  ? Highest education level: Not on file  ?Occupational History  ? Not on file  ?Tobacco Use  ? Smoking status: Every Day  ?  Packs/day: 1.00  ?  Types: Cigarettes  ? Smokeless tobacco: Former  ?  Types: Chew  ?Vaping Use  ? Vaping Use: Never used  ?Substance and Sexual Activity  ? Alcohol use: Yes  ?  Comment: daily  ? Drug use: No  ? Sexual activity: Yes  ?Other Topics Concern  ? Not on file  ?Social History Narrative  ? Not on file  ? ?Social Determinants of Health  ? ?Financial Resource Strain: Not on file  ?Food Insecurity: Not on file  ?Transportation Needs: Not on file  ?Physical Activity: Not on file  ?Stress: Not on file  ?Social Connections: Not on file  ? ?Additional Social History:  ?  ?  ?  ?  ?  ?  ?  ?  ?  ?  ?  ? ?Sleep: Fair ? ?Appetite:  Fair ? ?Current Medications: ?Current Facility-Administered Medications  ?Medication Dose Route Frequency Provider Last Rate Last Admin  ? acetaminophen (TYLENOL) tablet 650 mg  650 mg Oral Q6H PRN Freida Busman, MD   650 mg at 12/20/21 0850  ? alum & mag hydroxide-simeth (MAALOX/MYLANTA) 200-200-20 MG/5ML suspension 30 mL  30 mL Oral Q4H PRN Damita Dunnings B, MD      ? amLODipine (NORVASC) tablet 10 mg  10 mg Oral Daily Kameka Whan, Madie Reno, MD   10 mg at 12/23/21 7782  ? aspirin chewable tablet 81 mg  81 mg Oral Daily Damita Dunnings B, MD   81 mg at 12/23/21 4235  ? [START ON 12/24/2021] cariprazine (VRAYLAR) capsule 4.5 mg  4.5 mg Oral Daily Javarus Dorner T, MD      ? divalproex  (DEPAKOTE) DR tablet 1,000 mg  1,000 mg Oral BID Naryah Clenney, Madie Reno, MD   1,000 mg at 12/23/21 3614  ? famotidine (PEPCID) tablet 40 mg  40 mg Oral Daily Damita Dunnings B, MD   40 mg at 12/23/21 4315  ? folic acid (FOLVITE) tablet 1 mg  1 mg Oral QHS Parisha Beaulac, Madie Reno, MD   1 mg at 12/22/21 2112  ?  gabapentin (NEURONTIN) capsule 400 mg  400 mg Oral TID Damita Dunnings B, MD   400 mg at 12/23/21 1218  ? levothyroxine (SYNTHROID) tablet 175 mcg  175 mcg Oral Q0600 Damita Dunnings B, MD   175 mcg at 12/23/21 7829  ? losartan (COZAAR) tablet 100 mg  100 mg Oral Daily Damita Dunnings B, MD   100 mg at 12/23/21 5621  ? magnesium hydroxide (MILK OF MAGNESIA) suspension 30 mL  30 mL Oral Daily PRN Damita Dunnings B, MD      ? magnesium oxide (MAG-OX) tablet 400 mg  400 mg Oral QHS Ioannis Schuh T, MD   400 mg at 12/22/21 2112  ? metoprolol succinate (TOPROL-XL) 24 hr tablet 25 mg  25 mg Oral Daily Tayten Bergdoll, Madie Reno, MD   25 mg at 12/23/21 3086  ? PARoxetine (PAXIL) tablet 20 mg  20 mg Oral Daily Malik, Kashif   20 mg at 12/23/21 5784  ? QUEtiapine (SEROQUEL) tablet 800 mg  800 mg Oral QHS Gwendolyn Nishi T, MD   800 mg at 12/22/21 2112  ? rosuvastatin (CRESTOR) tablet 5 mg  5 mg Oral Daily Damita Dunnings B, MD   5 mg at 12/23/21 0825  ? thiamine tablet 100 mg  100 mg Oral Daily Damita Dunnings B, MD   100 mg at 12/23/21 6962  ? ? ?Lab Results: No results found for this or any previous visit (from the past 48 hour(s)). ? ?Blood Alcohol level:  ?Lab Results  ?Component Value Date  ? ETH 125 (H) 12/14/2021  ? ETH 272 (H) 10/07/2021  ? ? ?Metabolic Disorder Labs: ?Lab Results  ?Component Value Date  ? HGBA1C 6.4 (H) 10/08/2021  ? MPG 136.98 10/08/2021  ? MPG 146 (H) 09/02/2012  ? ?No results found for: PROLACTIN ?Lab Results  ?Component Value Date  ? CHOL 166 12/18/2021  ? TRIG 146 12/18/2021  ? HDL 38 (L) 12/18/2021  ? CHOLHDL 4.4 12/18/2021  ? VLDL 29 12/18/2021  ? [place] 99 12/18/2021  ? [place] 90 09/02/2012  ? ? ?Physical Findings: ?AIMS:  ,  ,  ,  ,    ?CIWA:    ?COWS:    ? ?Musculoskeletal: ?Strength & Muscle Tone: within normal limits ?Gait & Station: normal ?Patient leans: N/A ? ?Psychiatric Specialty Exam: ? ?Presentation  ?General Appeara

## 2021-12-23 NOTE — Progress Notes (Signed)
Recreation Therapy Notes ? ?Date: 12/23/2021 ? ?Time: 10:50 am   ? ?Location: Courtyard   ? ?Behavioral response: N/A ?  ?Intervention Topic: Leisure   ? ?Discussion/Intervention: ?Patient refused to attend group.  ? ?Clinical Observations/Feedback:  ?Patient refused to attend group.  ?  ?Leevi Cullars LRT/CTRS ? ? ? ? ? ? ? ?Rodney Leach ?12/23/2021 1:01 PM ?

## 2021-12-23 NOTE — Progress Notes (Signed)
Patient noted in room asleep most of shift. Awake periodically throughout the night. Remained in room in no distress ?

## 2021-12-23 NOTE — Group Note (Signed)
[place] LCSW Group Therapy Note ? ? ? ?Group Date: 12/23/2021 ?Start Time: 1300 ?End Time: 1400 ? ?Type of Therapy and Topic:  Group Therapy:  Overcoming Obstacles ? ?Participation Level:  [facility] PARTICIPATION LEVEL: Did Not Attend ? ?Mood: ? ?Description of Group:   ?In this group patients will be encouraged to explore what they see as obstacles to their own wellness and recovery. They will be guided to discuss their thoughts, feelings, and behaviors related to these obstacles. The group will process together ways to cope with barriers, with attention given to specific choices patients can make. Each patient will be challenged to identify changes they are motivated to make in order to overcome their obstacles. This group will be process-oriented, with patients participating in exploration of their own experiences as well as giving and receiving support and challenge from other group members. ? ?Therapeutic Goals: ?1. Patient will identify personal and current obstacles as they relate to admission. ?2. Patient will identify barriers that currently interfere with their wellness or overcoming obstacles.  ?3. Patient will identify feelings, thought process and behaviors related to these barriers. ?4. Patient will identify two changes they are willing to make to overcome these obstacles:  ? ? ?Summary of Patient Progress ? ? ?Patient did not attend group despite encouraged participation.  ? ? ?Therapeutic Modalities:   ?Cognitive Behavioral Therapy ?Solution Focused Therapy ?Motivational Interviewing ?Relapse Prevention Therapy ? ? ?Durenda Hurt, LCSWA ?

## 2021-12-24 DIAGNOSIS — F319 Bipolar disorder, unspecified: Secondary | ICD-10-CM | POA: Diagnosis not present

## 2021-12-24 MED ORDER — TEMAZEPAM 15 MG PO CAPS
15.0000 mg | ORAL_CAPSULE | Freq: Every day | ORAL | Status: DC
Start: 2021-12-24 — End: 2021-12-26
  Administered 2021-12-24 – 2021-12-25 (×2): 15 mg via ORAL
  Filled 2021-12-24 (×2): qty 1

## 2021-12-24 NOTE — Progress Notes (Signed)
Patient compliant with medications stated he felt better this evening after resting today. Support and encouragement provided. No adverse drug noted. Will continue to monitor.  ?

## 2021-12-24 NOTE — Group Note (Signed)
[place] LCSW Group Therapy Note ? ? ?Group Date: 12/24/2021 ?Start Time: 1300 ?End Time: 1400 ? ?Type of Therapy/Topic:  Group Therapy:  Feelings about Diagnosis ? ?Participation Level:  Did Not Attend  ? ? ?Description of Group:   ? This group will allow patients to explore their thoughts and feelings about diagnoses they have received. Patients will be guided to explore their level of understanding and acceptance of these diagnoses. Facilitator will encourage patients to process their thoughts and feelings about the reactions of others to their diagnosis, and will guide patients in identifying ways to discuss their diagnosis with significant others in their lives. This group will be process-oriented, with patients participating in exploration of their own experiences as well as giving and receiving support and challenge from other group members. ? ? ?Therapeutic Goals: ?1. Patient will demonstrate understanding of diagnosis as evidence by identifying two or more symptoms of the disorder:  ?2. Patient will be able to express two feelings regarding the diagnosis ?3. Patient will demonstrate ability to communicate their needs through discussion and/or role plays ? ?Summary of Patient Progress: ?X ? ? ? ?Therapeutic Modalities:   ?Cognitive Behavioral Therapy ?Brief Therapy ?Feelings Identification  ? ? ?Shirl Harris, LCSW ?

## 2021-12-24 NOTE — Plan of Care (Signed)
D: Pt alert and oriented. Pt rates depression 6/10 and anxiety 6/10. Pt reports experiencing generalized chronic pain at this time, prn medications offered and refused. Pt denies experiencing any SI/HI, or AVH at this time.  ? ?Pt shares that after speaking with the MD that he believes he will be here for a long time. When asked why, pt reports the MD said he would be here for a while.  ? ?A: Scheduled medications administered to pt, per MD orders. Support and encouragement provided. Frequent verbal contact made. Routine safety checks conducted q15 minutes.  ? ?R: No adverse drug reactions noted. Pt verbally contracts for safety at this time. Pt compliant with medications. Pt interacts well with others on the unit. Pt remains safe at this time. Will continue to monitor.  ? ?Problem: Education: ?Goal: Knowledge of [facility] Education information/materials will improve ?Outcome: Progressing ?  ?Problem: Coping: ?Goal: Coping ability will improve ?Outcome: Not Progressing ?  ?

## 2021-12-24 NOTE — Progress Notes (Signed)
Recreation Therapy Notes ? ?  ?Date: 12/24/2021 ?  ?Time: 9:55 am  ?  ?Location: Courtyard   ?  ?Behavioral response: N/A ?  ?Intervention Topic: Wellness  ?  ?Discussion/Intervention: ?Patient refused to attend group.  ?  ?Clinical Observations/Feedback:  ?Patient refused to attend group.  ?  ?Ilanna Deihl LRT/CTRS ? ? ? ? ? ? ? ?Charliee Krenz ?12/24/2021 11:14 AM ?

## 2021-12-24 NOTE — Progress Notes (Signed)
[facility] MD Progress Note ? ?12/24/2021 2:38 PM ?Rodney Leach  ?MRN:  [id number] ?Subjective: Follow-up for this patient with bipolar depression.  Patient seen and chart reviewed.  Patient says he did not sleep well last night.  Anxiety is still present but is improved.  Mood is slightly better.  He feels like he is not quite thinking at his normal level but things are getting better.  No new physical complaints ?Principal Problem: Bipolar depression ([place]) ?Diagnosis: Principal Problem: ?  Bipolar depression ([place]) ?Active Problems: ?  Type 2 diabetes mellitus with complication, without long-term current use of insulin ([place]) ?  Essential hypertension ?  Hypothyroidism ?  Alcohol use disorder ?  Bipolar disorder (manic depression) ([place]) ? ?Total Time spent with patient: 20 minutes ? ?Past Psychiatric History: Past history of bipolar depression and multiple previous hospitalizations also alcohol abuse ? ?Past Medical History:  ?Past Medical History:  ?Diagnosis Date  ? Adenomatous colon polyp   ? ADHD, adult residual type   ? Anxiety 09/03/2012  ? Adequate for discharge  ? Aortic atherosclerosis ([place])   ? Arthritis   ? Asthma   ? Atypical chest pain   ? Bipolar affective ([place])   ? Bipolar depression ([place])   ? Bipolar II disorder ([place])   ? CAD (coronary artery disease)   ? Carpal tunnel syndrome of right wrist 10/21/2017  ? Cervical disc displacement 03/24/2017  ? Formatting of this note might be different from the original. CERVICAL 5-6, CERVICAL 6-7  ? Chronic pain syndrome   ? Complication of anesthesia   ? has woken up during surgery before   ? Depression 09/03/2012  ? Adequate for discharge  ? Diabetes mellitus without complication ([place])   ? Emphysema/COPD [facility])   ? Essential hypertension 10/09/2021  ? Blood pressure is picking up.  Hypotensive on arrival.  Resume amlodipine today.  May not need to multiple antihypertensives.  ? GERD (gastroesophageal reflux disease)   ? Hepatic steatosis   ? Hypomagnesemia 10/09/2021  ?  Replace aggressively today.  Recheck levels tomorrow including phosphorus and potassium.  ? Hypothyroidism   ? Impotence, organic   ? Irregular heart beat   ? Lumbar radiculopathy, chronic   ? Mixed hyperlipidemia   ? MRSA (methicillin resistant staph aureus) culture positive 08/08/2016  ? Formatting of this note might be different from the original. UPDATE : MRSA RE-SWAB FOR NEXT SURGERY ALSO +MRSA ON 03-24-17  + MRSA NASAL SWAB ON 08-08-16  ? Nodule of lower lobe of right lung   ? Obstructive sleep apnea 10/09/2021  ? Start using CPAP at night.  Wean off oxygen.  ? Pneumonia   ? Postlaminectomy syndrome of lumbosacral region 08/08/2016  ? PTSD (post-traumatic stress disorder)   ? Recurrent major depressive disorder ([place]) 01/30/2018  ? Sciatica 08/08/2016  ? Shock ([place]) 10/07/2021  ? Slurred speech 01/14/2013  ? Spinal stenosis 08/08/2016  ? Stroke [facility])   ? "light" stroke  ? Type 2 diabetes mellitus with complication, without long-term current use of insulin ([place]) 10/09/2021  ? On metformin at home.  Fairly controlled as per patient.  Holding metformin.  Currently on sliding scale insulin.  Resume metformin on discharge.  ? Type 2 diabetes mellitus with diabetic neuropathy ([place])   ? Type 2 diabetes mellitus with hyperlipidemia ([place])   ?  ?Past Surgical History:  ?Procedure Laterality Date  ? BACK SURGERY    ? fusion L5/S1  ? COLONOSCOPY WITH PROPOFOL N/A 09/03/2017  ? Procedure: COLONOSCOPY  WITH PROPOFOL;  Surgeon: Laurence Spates, MD;  Location: [place];  Service: Endoscopy;  Laterality: N/A;  ? HAND SURGERY    ? R & L   ? HERNIA REPAIR    ? umbilical  ? MULTIPLE TOOTH EXTRACTIONS    ? NOSE SURGERY    ? fracture repair  ? TOTAL KNEE ARTHROPLASTY  2012  ? Right  ? TOTAL KNEE ARTHROPLASTY  04/07/2012  ? Procedure: TOTAL KNEE ARTHROPLASTY;  Surgeon: Ninetta Lights, MD;  Location: [place];  Service: Orthopedics;  Laterality: Left;  left total knee arthroplasty  ? ?Family History:  ?Family History  ?Problem Relation Age of  Onset  ? Hypertension Mother   ? Diabetes Mother   ? Hypertension Father   ? ?Family Psychiatric  History: See previous ?Social History:  ?Social History  ? ?Substance and Sexual Activity  ?Alcohol Use Yes  ? Comment: daily  ?   ?Social History  ? ?Substance and Sexual Activity  ?Drug Use No  ?  ?Social History  ? ?Socioeconomic History  ? Marital status: Married  ?  Spouse name: Not on file  ? Number of children: Not on file  ? Years of education: Not on file  ? Highest education level: Not on file  ?Occupational History  ? Not on file  ?Tobacco Use  ? Smoking status: Every Day  ?  Packs/day: 1.00  ?  Types: Cigarettes  ? Smokeless tobacco: Former  ?  Types: Chew  ?Vaping Use  ? Vaping Use: Never used  ?Substance and Sexual Activity  ? Alcohol use: Yes  ?  Comment: daily  ? Drug use: No  ? Sexual activity: Yes  ?Other Topics Concern  ? Not on file  ?Social History Narrative  ? Not on file  ? ?Social Determinants of Health  ? ?Financial Resource Strain: Not on file  ?Food Insecurity: Not on file  ?Transportation Needs: Not on file  ?Physical Activity: Not on file  ?Stress: Not on file  ?Social Connections: Not on file  ? ?Additional Social History:  ?  ?  ?  ?  ?  ?  ?  ?  ?  ?  ?  ? ?Sleep: Fair ? ?Appetite:  Fair ? ?Current Medications: ?Current Facility-Administered Medications  ?Medication Dose Route Frequency Provider Last Rate Last Admin  ? acetaminophen (TYLENOL) tablet 650 mg  650 mg Oral Q6H PRN Damita Dunnings B, MD   650 mg at 12/24/21 1434  ? alum & mag hydroxide-simeth (MAALOX/MYLANTA) 200-200-20 MG/5ML suspension 30 mL  30 mL Oral Q4H PRN Damita Dunnings B, MD      ? amLODipine (NORVASC) tablet 10 mg  10 mg Oral Daily Roston Grunewald, Madie Reno, MD   10 mg at 12/24/21 0858  ? aspirin chewable tablet 81 mg  81 mg Oral Daily Damita Dunnings B, MD   81 mg at 12/24/21 0857  ? cariprazine (VRAYLAR) capsule 4.5 mg  4.5 mg Oral Daily Wilburn Keir, Madie Reno, MD   4.5 mg at 12/24/21 0858  ? divalproex (DEPAKOTE) DR tablet 1,000 mg   1,000 mg Oral BID Ula Couvillon, Madie Reno, MD   1,000 mg at 12/24/21 0857  ? famotidine (PEPCID) tablet 40 mg  40 mg Oral Daily Damita Dunnings B, MD   40 mg at 12/24/21 0858  ? folic acid (FOLVITE) tablet 1 mg  1 mg Oral QHS Brenlynn Fake T, MD   1 mg at 12/23/21 2100  ? gabapentin (NEURONTIN) capsule 400 mg  400  mg Oral TID Damita Dunnings B, MD   400 mg at 12/24/21 1203  ? levothyroxine (SYNTHROID) tablet 175 mcg  175 mcg Oral Q0600 Damita Dunnings B, MD   175 mcg at 12/24/21 0700  ? losartan (COZAAR) tablet 100 mg  100 mg Oral Daily Damita Dunnings B, MD   100 mg at 12/24/21 8466  ? magnesium hydroxide (MILK OF MAGNESIA) suspension 30 mL  30 mL Oral Daily PRN Damita Dunnings B, MD      ? magnesium oxide (MAG-OX) tablet 400 mg  400 mg Oral QHS Ezabella Teska, Madie Reno, MD   400 mg at 12/23/21 2100  ? metoprolol succinate (TOPROL-XL) 24 hr tablet 25 mg  25 mg Oral Daily Yusif Gnau, Madie Reno, MD   25 mg at 12/24/21 0858  ? PARoxetine (PAXIL) tablet 20 mg  20 mg Oral Daily Malik, Kashif   20 mg at 12/24/21 0859  ? QUEtiapine (SEROQUEL) tablet 800 mg  800 mg Oral QHS Sanyiah Kanzler, Madie Reno, MD   800 mg at 12/23/21 2058  ? rosuvastatin (CRESTOR) tablet 5 mg  5 mg Oral Daily Damita Dunnings B, MD   5 mg at 12/24/21 0859  ? temazepam (RESTORIL) capsule 15 mg  15 mg Oral QHS Lewis Grivas T, MD      ? thiamine tablet 100 mg  100 mg Oral Daily Damita Dunnings B, MD   100 mg at 12/24/21 5993  ? ? ?Lab Results: No results found for this or any previous visit (from the past 48 hour(s)). ? ?Blood Alcohol level:  ?Lab Results  ?Component Value Date  ? ETH 125 (H) 12/14/2021  ? ETH 272 (H) 10/07/2021  ? ? ?Metabolic Disorder Labs: ?Lab Results  ?Component Value Date  ? HGBA1C 6.4 (H) 10/08/2021  ? MPG 136.98 10/08/2021  ? MPG 146 (H) 09/02/2012  ? ?No results found for: PROLACTIN ?Lab Results  ?Component Value Date  ? CHOL 166 12/18/2021  ? TRIG 146 12/18/2021  ? HDL 38 (L) 12/18/2021  ? CHOLHDL 4.4 12/18/2021  ? VLDL 29 12/18/2021  ? [place] 99 12/18/2021  ? [place] 90  09/02/2012  ? ? ?Physical Findings: ?AIMS:  , ,  ,  ,    ?CIWA:    ?COWS:    ? ?Musculoskeletal: ?Strength & Muscle Tone: within normal limits ?Gait & Station: normal ?Patient leans: N/A ? ?Psychiatric Speci

## 2021-12-25 DIAGNOSIS — F319 Bipolar disorder, unspecified: Secondary | ICD-10-CM | POA: Diagnosis not present

## 2021-12-25 NOTE — Group Note (Signed)
[place] LCSW Group Therapy Note ? ? ?Group Date: 12/25/2021 ?Start Time: 1300 ?End Time: 1400 ? ? ?Type of Therapy/Topic:  Group Therapy:  Emotion Regulation ? ?Participation Level:  Active  ? ?Mood: ? ?Description of Group:   ? The purpose of this group is to assist patients in learning to regulate negative emotions and experience positive emotions. Patients will be guided to discuss ways in which they have been vulnerable to their negative emotions. These vulnerabilities will be juxtaposed with experiences of positive emotions or situations, and patients challenged to use positive emotions to combat negative ones. Special emphasis will be placed on coping with negative emotions in conflict situations, and patients will process healthy conflict resolution skills. ? ?Therapeutic Goals: ?Patient will identify two positive emotions or experiences to reflect on in order to balance out negative emotions:  ?Patient will label two or more emotions that they find the most difficult to experience:  ?Patient will be able to demonstrate positive conflict resolution skills through discussion or role plays:  ? ?Summary of Patient Progress: ?Patient was present in group. Patient was appropriate and supportive of other group members.  Patient shared that he is trying to work on the "bigger fish" and "not the small things".  He reports that he knows that he needs to see a therapist.  ? ?Therapeutic Modalities:   ?Cognitive Behavioral Therapy ?Feelings Identification ?Dialectical Behavioral Therapy ? ? ?Rozann Lesches, LCSW ?

## 2021-12-25 NOTE — Plan of Care (Signed)
?  Problem: Education: ?Goal: Knowledge of [place] [facility] Education information/materials will improve ?Outcome: Progressing ?Goal: Emotional status will improve ?Outcome: Progressing ?Goal: Mental status will improve ?Outcome: Progressing ?Goal: Verbalization of understanding the information provided will improve ?Outcome: Progressing ?  ?Problem: Safety: ?Goal: Periods of time without injury will increase ?Outcome: Progressing ?  ?Problem: Physical Regulation: ?Goal: Complications related to the disease process, condition or treatment will be avoided or minimized ?Outcome: Progressing ?  ?Problem: Safety: ?Goal: Ability to remain free from injury will improve ?Outcome: Progressing ?  ?Problem: Coping: ?Goal: Ability to identify and develop effective coping behavior will improve ?Outcome: Progressing ?  ?Problem: Coping: ?Goal: Coping ability will improve ?Outcome: Progressing ?Goal: Will verbalize feelings ?Outcome: Progressing ?  ?

## 2021-12-25 NOTE — Progress Notes (Signed)
Recreation Therapy Notes ? ?Date: 12/25/2021 ? ?Time: 10:35 am   ? ?Location: Craft room  ? ?Behavioral response: N/A ?  ?Intervention Topic: Self-care  ? ?Discussion/Intervention: ?Patient refused to attend group.  ? ?Clinical Observations/Feedback:  ?Patient refused to attend group.  ?  ?Rodney Leach LRT/CTRS ? ? ? ? ? ? ? ?Rodney Leach ?12/25/2021 12:36 PM ?

## 2021-12-25 NOTE — Progress Notes (Signed)
At the nurses station requesting Tylenol for a headache. Appears anxious and out of sorts. Speech is pressured at this encounter.  Rates his headache at 9/10.  Reports not being able to sleep. Seroquel seems to have the opposite effect as he is up and active pacing the unit.  Will continue to monitor  with q 15 minute safety rounds.  Encouraged him to seek staff if symptoms worsened. ? ? ? ? ?C Butler-Nicholson, LPN ?

## 2021-12-25 NOTE — Progress Notes (Signed)
[facility] MD Progress Note ? ?12/25/2021 11:48 AM ?Rodney Leach  ?MRN:  [id number] ?Subjective: Patient seen and chart reviewed.  Patient slept better last night.  He is feeling better today.  More alert than he was yesterday.  Anxiety still mild but depression better no suicidal thoughts.  Tolerating medicine well ?Principal Problem: Bipolar depression ([place]) ?Diagnosis: Principal Problem: ?  Bipolar depression ([place]) ?Active Problems: ?  Type 2 diabetes mellitus with complication, without long-term current use of insulin ([place]) ?  Essential hypertension ?  Hypothyroidism ?  Alcohol use disorder ?  Bipolar disorder (manic depression) ([place]) ? ?Total Time spent with patient: 30 minutes ? ?Past Psychiatric History: Past history of recurrent bipolar depression and alcohol abuse ? ?Past Medical History:  ?Past Medical History:  ?Diagnosis Date  ? Adenomatous colon polyp   ? ADHD, adult residual type   ? Anxiety 09/03/2012  ? Adequate for discharge  ? Aortic atherosclerosis ([place])   ? Arthritis   ? Asthma   ? Atypical chest pain   ? Bipolar affective ([place])   ? Bipolar depression ([place])   ? Bipolar II disorder ([place])   ? CAD (coronary artery disease)   ? Carpal tunnel syndrome of right wrist 10/21/2017  ? Cervical disc displacement 03/24/2017  ? Formatting of this note might be different from the original. CERVICAL 5-6, CERVICAL 6-7  ? Chronic pain syndrome   ? Complication of anesthesia   ? has woken up during surgery before   ? Depression 09/03/2012  ? Adequate for discharge  ? Diabetes mellitus without complication ([place])   ? Emphysema/COPD [facility])   ? Essential hypertension 10/09/2021  ? Blood pressure is picking up.  Hypotensive on arrival.  Resume amlodipine today.  May not need to multiple antihypertensives.  ? GERD (gastroesophageal reflux disease)   ? Hepatic steatosis   ? Hypomagnesemia 10/09/2021  ? Replace aggressively today.  Recheck levels tomorrow including phosphorus and potassium.  ? Hypothyroidism   ? Impotence, organic   ?  Irregular heart beat   ? Lumbar radiculopathy, chronic   ? Mixed hyperlipidemia   ? MRSA (methicillin resistant staph aureus) culture positive 08/08/2016  ? Formatting of this note might be different from the original. UPDATE : MRSA RE-SWAB FOR NEXT SURGERY ALSO +MRSA ON 03-24-17  + MRSA NASAL SWAB ON 08-08-16  ? Nodule of lower lobe of right lung   ? Obstructive sleep apnea 10/09/2021  ? Start using CPAP at night.  Wean off oxygen.  ? Pneumonia   ? Postlaminectomy syndrome of lumbosacral region 08/08/2016  ? PTSD (post-traumatic stress disorder)   ? Recurrent major depressive disorder ([place]) 01/30/2018  ? Sciatica 08/08/2016  ? Shock ([place]) 10/07/2021  ? Slurred speech 01/14/2013  ? Spinal stenosis 08/08/2016  ? Stroke [facility])   ? "light" stroke  ? Type 2 diabetes mellitus with complication, without long-term current use of insulin ([place]) 10/09/2021  ? On metformin at home.  Fairly controlled as per patient.  Holding metformin.  Currently on sliding scale insulin.  Resume metformin on discharge.  ? Type 2 diabetes mellitus with diabetic neuropathy ([place])   ? Type 2 diabetes mellitus with hyperlipidemia ([place])   ?  ?Past Surgical History:  ?Procedure Laterality Date  ? BACK SURGERY    ? fusion L5/S1  ? COLONOSCOPY WITH PROPOFOL N/A 09/03/2017  ? Procedure: COLONOSCOPY WITH PROPOFOL;  Surgeon: Laurence Spates, MD;  Location: [place];  Service: Endoscopy;  Laterality: N/A;  ? HAND SURGERY    ?  R & L   ? HERNIA REPAIR    ? umbilical  ? MULTIPLE TOOTH EXTRACTIONS    ? NOSE SURGERY    ? fracture repair  ? TOTAL KNEE ARTHROPLASTY  2012  ? Right  ? TOTAL KNEE ARTHROPLASTY  04/07/2012  ? Procedure: TOTAL KNEE ARTHROPLASTY;  Surgeon: Ninetta Lights, MD;  Location: [place];  Service: Orthopedics;  Laterality: Left;  left total knee arthroplasty  ? ?Family History:  ?Family History  ?Problem Relation Age of Onset  ? Hypertension Mother   ? Diabetes Mother   ? Hypertension Father   ? ?Family Psychiatric  History: See previous ?Social  History:  ?Social History  ? ?Substance and Sexual Activity  ?Alcohol Use Yes  ? Comment: daily  ?   ?Social History  ? ?Substance and Sexual Activity  ?Drug Use No  ?  ?Social History  ? ?Socioeconomic History  ? Marital status: Married  ?  Spouse name: Not on file  ? Number of children: Not on file  ? Years of education: Not on file  ? Highest education level: Not on file  ?Occupational History  ? Not on file  ?Tobacco Use  ? Smoking status: Every Day  ?  Packs/day: 1.00  ?  Types: Cigarettes  ? Smokeless tobacco: Former  ?  Types: Chew  ?Vaping Use  ? Vaping Use: Never used  ?Substance and Sexual Activity  ? Alcohol use: Yes  ?  Comment: daily  ? Drug use: No  ? Sexual activity: Yes  ?Other Topics Concern  ? Not on file  ?Social History Narrative  ? Not on file  ? ?Social Determinants of Health  ? ?Financial Resource Strain: Not on file  ?Food Insecurity: Not on file  ?Transportation Needs: Not on file  ?Physical Activity: Not on file  ?Stress: Not on file  ?Social Connections: Not on file  ? ?Additional Social History:  ?  ?  ?  ?  ?  ?  ?  ?  ?  ?  ?  ? ?Sleep: Fair ? ?Appetite:  Fair ? ?Current Medications: ?Current Facility-Administered Medications  ?Medication Dose Route Frequency Provider Last Rate Last Admin  ? acetaminophen (TYLENOL) tablet 650 mg  650 mg Oral Q6H PRN Freida Busman, MD   650 mg at 12/25/21 0034  ? alum & mag hydroxide-simeth (MAALOX/MYLANTA) 200-200-20 MG/5ML suspension 30 mL  30 mL Oral Q4H PRN Damita Dunnings B, MD      ? amLODipine (NORVASC) tablet 10 mg  10 mg Oral Daily Devan Babino, Madie Reno, MD   10 mg at 12/25/21 3810  ? aspirin chewable tablet 81 mg  81 mg Oral Daily Damita Dunnings B, MD   81 mg at 12/25/21 0820  ? cariprazine (VRAYLAR) capsule 4.5 mg  4.5 mg Oral Daily Tamira Ryland, Madie Reno, MD   4.5 mg at 12/25/21 0820  ? divalproex (DEPAKOTE) DR tablet 1,000 mg  1,000 mg Oral BID Tamlyn Sides, Madie Reno, MD   1,000 mg at 12/25/21 0820  ? famotidine (PEPCID) tablet 40 mg  40 mg Oral Daily Damita Dunnings B, MD   40 mg at 12/25/21 0820  ? folic acid (FOLVITE) tablet 1 mg  1 mg Oral QHS Billi Bright T, MD   1 mg at 12/24/21 2107  ? gabapentin (NEURONTIN) capsule 400 mg  400 mg Oral TID Damita Dunnings B, MD   400 mg at 12/25/21 1134  ? levothyroxine (SYNTHROID) tablet 175 mcg  175 mcg Oral  [id number] Freida Busman, MD   175 mcg at 12/25/21 7096  ? losartan (COZAAR) tablet 100 mg  100 mg Oral Daily Damita Dunnings B, MD   100 mg at 12/25/21 0820  ? magnesium hydroxide (MILK OF MAGNESIA) suspension 30 mL  30 mL Oral Daily PRN Damita Dunnings B, MD      ? magnesium oxide (MAG-OX) tablet 400 mg  400 mg Oral QHS Satya Bohall, Madie Reno, MD   400 mg at 12/24/21 2111  ? metoprolol succinate (TOPROL-XL) 24 hr tablet 25 mg  25 mg Oral Daily Kevonta Phariss, Madie Reno, MD   25 mg at 12/25/21 2836  ? PARoxetine (PAXIL) tablet 20 mg  20 mg Oral Daily Malik, Kashif   20 mg at 12/25/21 6294  ? QUEtiapine (SEROQUEL) tablet 800 mg  800 mg Oral QHS Kaelynne Christley T, MD   800 mg at 12/24/21 2107  ? rosuvastatin (CRESTOR) tablet 5 mg  5 mg Oral Daily Damita Dunnings B, MD   5 mg at 12/25/21 0820  ? temazepam (RESTORIL) capsule 15 mg  15 mg Oral QHS Kendyll Huettner, Madie Reno, MD   15 mg at 12/24/21 2108  ? thiamine tablet 100 mg  100 mg Oral Daily Damita Dunnings B, MD   100 mg at 12/25/21 7654  ? ? ?Lab Results: No results found for this or any previous visit (from the past 48 hour(s)). ? ?Blood Alcohol level:  ?Lab Results  ?Component Value Date  ? ETH 125 (H) 12/14/2021  ? ETH 272 (H) 10/07/2021  ? ? ?Metabolic Disorder Labs: ?Lab Results  ?Component Value Date  ? HGBA1C 6.4 (H) 10/08/2021  ? MPG 136.98 10/08/2021  ? MPG 146 (H) 09/02/2012  ? ?No results found for: PROLACTIN ?Lab Results  ?Component Value Date  ? CHOL 166 12/18/2021  ? TRIG 146 12/18/2021  ? HDL 38 (L) 12/18/2021  ? CHOLHDL 4.4 12/18/2021  ? VLDL 29 12/18/2021  ? [place] 99 12/18/2021  ? [place] 90 09/02/2012  ? ? ?Physical Findings: ?AIMS:  , ,  ,  ,    ?CIWA:    ?COWS:    ? ?Musculoskeletal: ?Strength &  Muscle Tone: within normal limits ?Gait & Station: normal ?Patient leans: N/A ? ?Psychiatric Specialty Exam: ? ?Presentation  ?General Appearance: Appropriate for Environment; Casual; Neat ? ?Eye Contact:Fai

## 2021-12-25 NOTE — Progress Notes (Signed)
Patient reports starting to have slight tick in his right hand.  Resulting in him spilling his coffee. Reminded him to speak with the doctor about the issue. ? ? ? ? ?C Butler-Nicholson, LPN ?

## 2021-12-25 NOTE — Plan of Care (Signed)
?  Problem: Education: ?Goal: Knowledge of [place] [facility] Education information/materials will improve ?Outcome: Progressing ?Goal: Emotional status will improve ?Outcome: Progressing ?Goal: Mental status will improve ?Outcome: Progressing ?Goal: Verbalization of understanding the information provided will improve ?Outcome: Progressing ?  ?Problem: Safety: ?Goal: Periods of time without injury will increase ?Outcome: Progressing ?  ?Problem: Physical Regulation: ?Goal: Complications related to the disease process, condition or treatment will be avoided or minimized ?Outcome: Progressing ?  ?Problem: Health Behavior/Discharge Planning: ?Goal: Compliance with therapeutic regimen will improve ?Outcome: Progressing ?  ?Problem: Coping: ?Goal: Coping ability will improve ?Outcome: Progressing ?Goal: Will verbalize feelings ?Outcome: Progressing ?  ?

## 2021-12-25 NOTE — Plan of Care (Signed)
Patient stated that he had a better day today. Pleasant and cooperative on approach. Visible in the milieu,interacting with peers. Denies SI,HI and AVH. Looking forward for discharge tomorrow. Appetite and energy level good. Support and encouragement given. ?

## 2021-12-26 DIAGNOSIS — F319 Bipolar disorder, unspecified: Secondary | ICD-10-CM | POA: Diagnosis not present

## 2021-12-26 MED ORDER — TEMAZEPAM 15 MG PO CAPS: 15.0000 mg | ORAL_CAPSULE | Freq: Every day | ORAL | 1 refills | Status: DC

## 2021-12-26 MED ORDER — GABAPENTIN 400 MG PO CAPS: 400.0000 mg | ORAL_CAPSULE | Freq: Three times a day (TID) | ORAL | 1 refills | Status: AC

## 2021-12-26 MED ORDER — ALBUTEROL SULFATE HFA 108 (90 BASE) MCG/ACT IN AERS: 2.0000 | INHALATION_SPRAY | Freq: Four times a day (QID) | RESPIRATORY_TRACT | 1 refills | Status: AC | PRN

## 2021-12-26 MED ORDER — CARIPRAZINE HCL 4.5 MG PO CAPS: 4.5000 mg | ORAL_CAPSULE | Freq: Every day | ORAL | 1 refills | Status: DC

## 2021-12-26 MED ORDER — QUETIAPINE FUMARATE 400 MG PO TABS: 800.0000 mg | ORAL_TABLET | Freq: Every day | ORAL | 1 refills | Status: DC

## 2021-12-26 MED ORDER — ROSUVASTATIN CALCIUM 5 MG PO TABS: 5.0000 mg | ORAL_TABLET | Freq: Every day | ORAL | 1 refills | Status: DC

## 2021-12-26 MED ORDER — PAROXETINE HCL 20 MG PO TABS: 20.0000 mg | ORAL_TABLET | Freq: Every day | ORAL | 1 refills | Status: DC

## 2021-12-26 MED ORDER — METOPROLOL SUCCINATE ER 25 MG PO TB24: 25.0000 mg | ORAL_TABLET | Freq: Every day | ORAL | 1 refills | Status: AC

## 2021-12-26 MED ORDER — MAGNESIUM OXIDE -MG SUPPLEMENT 400 (240 MG) MG PO TABS: 400.0000 mg | ORAL_TABLET | Freq: Every day | ORAL | 1 refills | Status: DC

## 2021-12-26 MED ORDER — FAMOTIDINE 40 MG PO TABS: 40.0000 mg | ORAL_TABLET | Freq: Every day | ORAL | 1 refills | Status: DC

## 2021-12-26 MED ORDER — ASPIRIN 81 MG PO CHEW: 81.0000 mg | CHEWABLE_TABLET | Freq: Every day | ORAL | 1 refills | Status: DC

## 2021-12-26 MED ORDER — LOSARTAN POTASSIUM 100 MG PO TABS: 100.0000 mg | ORAL_TABLET | Freq: Every day | ORAL | 1 refills | Status: AC

## 2021-12-26 MED ORDER — AMLODIPINE BESYLATE 10 MG PO TABS: 10.0000 mg | ORAL_TABLET | Freq: Every day | ORAL | 1 refills | Status: DC

## 2021-12-26 MED ORDER — LEVOTHYROXINE SODIUM 175 MCG PO TABS: 175.0000 ug | ORAL_TABLET | Freq: Every day | ORAL | 1 refills | Status: AC

## 2021-12-26 MED ORDER — FOLIC ACID 1 MG PO TABS
1.0000 mg | ORAL_TABLET | Freq: Every day | ORAL | 1 refills | Status: AC
Start: 2021-12-26 — End: ?

## 2021-12-26 MED ORDER — THIAMINE HCL 100 MG PO TABS: 100.0000 mg | ORAL_TABLET | Freq: Every day | ORAL | 1 refills | Status: DC

## 2021-12-26 MED ORDER — DIVALPROEX SODIUM 500 MG PO DR TAB: 1000.0000 mg | DELAYED_RELEASE_TABLET | Freq: Two times a day (BID) | ORAL | 1 refills | Status: AC

## 2021-12-26 NOTE — Progress Notes (Signed)
Patient ID: Rodney Leach, male   DOB: 1960/08/17, 61 y.o.   MRN: [id number] Patient denies SI/HI/AVH. Belongings were returned to patient. Discharge instructions  including medication and follow up information were reviewed with patient and understanding was verbalized. Patient was not observed to be in distress at time of discharge. Patient was escorted out with staff to medical mall to be transported home by spouse.

## 2021-12-26 NOTE — BHH Suicide Risk Assessment (Signed)
[facility] Discharge Suicide Risk Assessment   Principal Problem: Bipolar depression ([place]) Discharge Diagnoses: Principal Problem:   Bipolar depression ([place]) Active Problems:   Type 2 diabetes mellitus with complication, without long-term current use of insulin ([place])   Essential hypertension   Hypothyroidism   Alcohol use disorder   Bipolar disorder (manic depression) ([place])   Total Time spent with patient: 30 minutes  Musculoskeletal: Strength & Muscle Tone: within normal limits Gait & Station: normal Patient leans: N/A  Psychiatric Specialty Exam  Presentation  General Appearance: Appropriate for Environment; Casual; Neat  Eye Contact:Fair  Speech:Clear and Coherent  Speech Volume:Normal  Handedness:Right   Mood and Affect  Mood:Anxious; Depressed  Duration of Depression Symptoms: Greater than two weeks  Affect:Constricted   Thought Process  Thought Processes:Coherent; Linear  Descriptions of Associations:Intact  Orientation:Full (Time, Place and Person)  Thought Content:Abstract Reasoning; Logical  History of Schizophrenia/Schizoaffective disorder:No  Duration of Psychotic Symptoms:No data recorded Hallucinations:No data recorded Ideas of Reference:None  Suicidal Thoughts:No data recorded Homicidal Thoughts:No data recorded  Sensorium  Memory:Immediate Fair; Remote Fair  Judgment:Fair  Insight:Fair   Executive Functions  Concentration:Fair  Attention Span:Fair  [place]   Psychomotor Activity  Psychomotor Activity:No data recorded  Assets  Assets:Communication Skills; Physical Health; Financial Resources/Insurance; Agricultural consultant; Housing   Sleep  Sleep:No data recorded  Physical Exam: Physical Exam Vitals and nursing note reviewed.  Constitutional:      Appearance: Normal appearance.  HENT:     Head: Normocephalic and atraumatic.     Mouth/Throat:      Pharynx: Oropharynx is clear.  Eyes:     Pupils: Pupils are equal, round, and reactive to light.  Cardiovascular:     Rate and Rhythm: Normal rate and regular rhythm.  Pulmonary:     Effort: Pulmonary effort is normal.     Breath sounds: Normal breath sounds.  Abdominal:     General: Abdomen is flat.     Palpations: Abdomen is soft.  Musculoskeletal:        General: Normal range of motion.  Skin:    General: Skin is warm and dry.  Neurological:     General: No focal deficit present.     Mental Status: He is alert. Mental status is at baseline.  Psychiatric:        Attention and Perception: Attention normal.        Mood and Affect: Mood normal.        Speech: Speech normal.        Behavior: Behavior is cooperative.        Thought Content: Thought content normal.        Cognition and Memory: Cognition normal.        Judgment: Judgment normal.   Review of Systems  Constitutional: Negative.   HENT: Negative.    Eyes: Negative.   Respiratory: Negative.    Cardiovascular: Negative.   Gastrointestinal: Negative.   Musculoskeletal: Negative.   Skin: Negative.   Neurological: Negative.   Psychiatric/Behavioral:  Negative for depression, hallucinations, memory loss, substance abuse and suicidal ideas. The patient is not nervous/anxious and does not have insomnia.   Blood pressure (!) 141/75, pulse 67, temperature 97.9 F (36.6 C), temperature source Oral, resp. rate 18, height '6\' 4"'$  (1.93 m), weight (!) 149.2 kg, SpO2 94 %. Body mass index is 40.05 kg/m.  Mental Status Per Nursing Assessment::   On Admission:  NA  Demographic Factors:  Male and Caucasian  Loss  Factors: NA  Historical Factors: Impulsivity  Risk Reduction Factors:   Sense of responsibility to family, Living with another person, especially a relative, Positive social support, and Positive therapeutic relationship  Continued Clinical Symptoms:  Bipolar Disorder:   Depressive phase  Cognitive Features  That Contribute To Risk:  None    Suicide Risk:  Minimal: No identifiable suicidal ideation.  Patients presenting with no risk factors but with morbid ruminations; may be classified as minimal risk based on the severity of the depressive symptoms   Follow-up [facility]. Go on 02/05/2022.   Specialty: Behavioral Health Why: Please present for scheduled in person appointment on 02/05/2022 at 1445. Please bring intake paperwork, photo ID, and insurance card. Contact information: [place], [place]. Go on 01/07/2022.   Why: Please present for scheduled in person appointment on Monday 01/07/2022 at 1010 AM. Please bring hospital discharge paperwork to appointment. Contact information: [place] [address] [phone]         [place] on 12/30/2021.   Why: Please present for WALK IN hour Monday 12/30/2021 at 0730. Clinic begins seeing patient at 0800 on a first come first serve basis. Contact information: [place] [address] [phone]                 Plan Of Care/Follow-up recommendations:  Follow-up with outpatient mental health treatment.  Continue current medicine.  Discharge plan reviewed with patient who expresses understanding and agreement.  Denies all suicidal ideation.  Denies homicidal ideation.  Behavior calm and appropriate with no sign of psychosis.  Does not appear to be in acute danger to self or others  Alethia Berthold, MD 12/26/2021, 10:37 AM

## 2021-12-26 NOTE — Group Note (Signed)
[facility] LCSW Group Therapy Note   Group Date: 12/26/2021 Start Time: 1300 End Time: 1400   Type of Therapy/Topic:  Group Therapy:  Balance in Life  Participation Level:  Did Not Attend   Description of Group:    This group will address the concept of balance and how it feels and looks when one is unbalanced. Patients will be encouraged to process areas in their lives that are out of balance, and identify reasons for remaining unbalanced. Facilitators will guide patients utilizing problem- solving interventions to address and correct the stressor making their life unbalanced. Understanding and applying boundaries will be explored and addressed for obtaining  and maintaining a balanced life. Patients will be encouraged to explore ways to assertively make their unbalanced needs known to significant others in their lives, using other group members and facilitator for support and feedback.  Therapeutic Goals: Patient will identify two or more emotions or situations they have that consume much of in their lives. Patient will identify signs/triggers that life has become out of balance:  Patient will identify two ways to set boundaries in order to achieve balance in their lives:  Patient will demonstrate ability to communicate their needs through discussion and/or role plays  Summary of Patient Progress: Patient did not attend group despite encouraged participation.     Therapeutic Modalities:   Cognitive Behavioral Therapy Solution-Focused Therapy Assertiveness Training   Sharlene Motts [place], [place]

## 2021-12-26 NOTE — Progress Notes (Signed)
  [facility] Adult Case Management Discharge Plan :  Will you be returning to the same living situation after discharge:  Yes,  Patient to return to place of residence.   At discharge, do you have transportation home?: Yes,  Patient's spouse to assist with transportation from hospital.   Do you have the ability to pay for your medications: Yes,  [employer] community PPO.  Release of information consent forms completed and in the chart;  Patient's signature needed at discharge.  Patient to Follow up at:  Follow-up Information     [facility]. Go on 02/05/2022.   Specialty: Behavioral Health Why: Please present for scheduled in person appointment on 02/05/2022 at 1445. Please bring intake paperwork, photo ID, and insurance card. Contact information: [place], [place]. Go on 01/07/2022.   Why: Please present for scheduled in person appointment on Monday 01/07/2022 at 1010 AM. Please bring hospital discharge paperwork to appointment. Contact information: [place] [address] [phone]         [place] on 12/30/2021.   Why: Please present for WALK IN hour Monday 12/30/2021 at 0730. Clinic begins seeing patient at 0800 on a first come first serve basis. Contact information: [place] [address] [phone]                  Next level of care provider has access to [place] and Suicide Prevention discussed: Yes,  SPE completed with patient, declined consent for CSW to reach family/friend.   Has patient been referred to the Quitline?: Patient refused referral Endorses intentions to quit smoking though refuses any resources to assist.  Tobacco Use: High Risk   Smoking Tobacco Use: Every Day   Smokeless Tobacco Use: Former   Passive Exposure: Not on  file    Patient has been referred for addiction treatment: Yes Patient expressed interest in attending [facility] walk in hours to begin treatment for Alcohol Use Disorder. SDOH shows moderate risk of alcohol related outcomes.   Durenda Hurt, LCSWA 12/26/2021, 9:51 AM

## 2021-12-26 NOTE — Progress Notes (Signed)
Recreation Therapy Notes  Date: 12/26/2021   Time: 10:40 am     Location: Court yard    Behavioral response: N/A   Intervention Topic: Values    Discussion/Intervention: Patient refused to attend group.    Clinical Observations/Feedback:  Patient refused to attend group.    Ayrianna Mcginniss LRT/CTRS        Laverne Klugh 12/26/2021 11:05 AM

## 2021-12-26 NOTE — Plan of Care (Signed)
  Problem: Education: Goal: Knowledge of [place] [facility] Education information/materials will improve Outcome: Adequate for Discharge Goal: Emotional status will improve Outcome: Adequate for Discharge Goal: Mental status will improve Outcome: Adequate for Discharge Goal: Verbalization of understanding the information provided will improve Outcome: Adequate for Discharge   Problem: Safety: Goal: Periods of time without injury will increase Outcome: Adequate for Discharge   Problem: Physical Regulation: Goal: Complications related to the disease process, condition or treatment will be avoided or minimized Outcome: Adequate for Discharge   Problem: Safety: Goal: Ability to remain free from injury will improve Outcome: Adequate for Discharge   Problem: Coping: Goal: Ability to identify and develop effective coping behavior will improve Outcome: Adequate for Discharge   Problem: Coping: Goal: Coping ability will improve Outcome: Adequate for Discharge Goal: Will verbalize feelings Outcome: Adequate for Discharge   Problem: Health Behavior/Discharge Planning: Goal: Compliance with therapeutic regimen will improve Outcome: Adequate for Discharge   Problem: Group Participation Goal: STG - Patient will engage in groups without prompting or encouragement from LRT x3 group sessions within 5 recreation therapy group sessions Description: STG - Patient will engage in groups without prompting or encouragement from LRT x3 group sessions within 5 recreation therapy group sessions Outcome: Adequate for Discharge

## 2021-12-26 NOTE — Progress Notes (Signed)
Recreation Therapy Notes  INPATIENT RECREATION TR PLAN  Patient Details Name: BURK HOCTOR MRN: [id number] DOB: Aug 08, 1961 Today's Date: 12/26/2021  Rec Therapy Plan Is patient appropriate for Therapeutic Recreation?: Yes Treatment times per week: at least 3 Estimated Length of Stay: 5-7 days TR Treatment/Interventions: Group participation (Comment)  Discharge Criteria Pt will be discharged from therapy if:: Discharged Treatment plan/goals/alternatives discussed and agreed upon by:: Patient/family  Discharge Summary Short term goals set: Patient will engage in groups without prompting or encouragement from LRT x3 group sessions within 5 recreation therapy group sessions Short term goals met: Not met Reason goals not met: Patient did not attend any groups Therapeutic equipment acquired: N/A Reason patient discharged from therapy: Discharge from hospital Pt/family agrees with progress & goals achieved: Yes Date patient discharged from therapy: 12/26/21   Donevin Sainsbury 12/26/2021, 3:23 PM

## 2021-12-26 NOTE — Discharge Summary (Signed)
Physician Discharge Summary Note  Patient:  Rodney Leach is an 61 y.o., male MRN:  [id number] DOB:  1961/03/22 Patient phone:  [phone] (home)  Patient address:   [place] [address],  Total Time spent with patient: 30 minutes  Date of Admission:  12/17/2021 Date of Discharge: 12/26/2021  Reason for Admission: Admitted because of exacerbation of bipolar depression and alcohol abuse suicidal ideation  Principal Problem: Bipolar depression ([place]) Discharge Diagnoses: Principal Problem:   Bipolar depression ([place]) Active Problems:   Type 2 diabetes mellitus with complication, without long-term current use of insulin ([place])   Essential hypertension   Hypothyroidism   Alcohol use disorder   Bipolar disorder (manic depression) ([place])   Past Psychiatric History: Past history of bipolar disorder recurrent depressive symptoms in particular as well as alcohol abuse and multiple medical problems  Past Medical History:  Past Medical History:  Diagnosis Date   Adenomatous colon polyp    ADHD, adult residual type    Anxiety 09/03/2012   Adequate for discharge   Aortic atherosclerosis ([name])    Arthritis    Asthma    Atypical chest pain    Bipolar affective ([place])    Bipolar depression ([place])    Bipolar II disorder ([place])    CAD (coronary artery disease)    Carpal tunnel syndrome of right wrist 10/21/2017   Cervical disc displacement 03/24/2017   Formatting of this note might be different from the original. CERVICAL 5-6, CERVICAL 6-7   Chronic pain syndrome    Complication of anesthesia    has woken up during surgery before    Depression 09/03/2012   Adequate for discharge   Diabetes mellitus without complication ([place])    Emphysema/COPD ([place])    Essential hypertension 10/09/2021   Blood pressure is picking up.  Hypotensive on arrival.  Resume amlodipine today.  May not need to multiple antihypertensives.   GERD (gastroesophageal reflux disease)    Hepatic  steatosis    Hypomagnesemia 10/09/2021   Replace aggressively today.  Recheck levels tomorrow including phosphorus and potassium.   Hypothyroidism    Impotence, organic    Irregular heart beat    Lumbar radiculopathy, chronic    Mixed hyperlipidemia    MRSA (methicillin resistant staph aureus) culture positive 08/08/2016   Formatting of this note might be different from the original. UPDATE : MRSA RE-SWAB FOR NEXT SURGERY ALSO +MRSA ON 03-24-17  + MRSA NASAL SWAB ON 08-08-16   Nodule of lower lobe of right lung    Obstructive sleep apnea 10/09/2021   Start using CPAP at night.  Wean off oxygen.   Pneumonia    Postlaminectomy syndrome of lumbosacral region 08/08/2016   PTSD (post-traumatic stress disorder)    Recurrent major depressive disorder ([place]) 01/30/2018   Sciatica 08/08/2016   Shock ([place]) 10/07/2021   Slurred speech 01/14/2013   Spinal stenosis 08/08/2016   Stroke ([place])    "light" stroke   Type 2 diabetes mellitus with complication, without long-term current use of insulin ([place] ) 10/09/2021   On metformin at home.  Fairly controlled as per patient.  Holding metformin.  Currently on sliding scale insulin.  Resume metformin on discharge.   Type 2 diabetes mellitus with diabetic neuropathy ([name])    Type 2 diabetes mellitus with hyperlipidemia [facility])     Past Surgical History:  Procedure Laterality Date   BACK SURGERY     fusion L5/S1   COLONOSCOPY WITH PROPOFOL N/A 09/03/2017   Procedure: COLONOSCOPY WITH  PROPOFOL;  Surgeon: Laurence Spates, MD;  Location: [facility];  Service: Endoscopy;  Laterality: N/A;   HAND SURGERY     R & L    HERNIA REPAIR     umbilical   MULTIPLE TOOTH EXTRACTIONS     NOSE SURGERY     fracture repair   TOTAL KNEE ARTHROPLASTY  2012   Right   TOTAL KNEE ARTHROPLASTY  04/07/2012   Procedure: TOTAL KNEE ARTHROPLASTY;  Surgeon: Ninetta Lights, MD;  Location: [facility];  Service: Orthopedics;  Laterality: Left;  left total knee arthroplasty   Family History:   Family History  Problem Relation Age of Onset   Hypertension Mother    Diabetes Mother    Hypertension Father    Family Psychiatric  History: See previous Social History:  Social History   Substance and Sexual Activity  Alcohol Use Yes   Comment: daily     Social History   Substance and Sexual Activity  Drug Use No    Social History   Socioeconomic History   Marital status: Married    Spouse name: Not on file   Number of children: Not on file   Years of education: Not on file   Highest education level: Not on file  Occupational History   Not on file  Tobacco Use   Smoking status: Every Day    Packs/day: 1.00    Types: Cigarettes   Smokeless tobacco: Former    Types: Nurse, children's Use: Never used  Substance and Sexual Activity   Alcohol use: Yes    Comment: daily   Drug use: No   Sexual activity: Yes  Other Topics Concern   Not on file  Social History Narrative   Not on file   Social Determinants of Health   Financial Resource Strain: Not on file  Food Insecurity: Not on file  Transportation Needs: Not on file  Physical Activity: Not on file  Stress: Not on file  Social Connections: Not on file    Hospital Course: Admitted to psychiatric unit.  15-minute checks continued.  Met with treatment team and appropriately engaged in therapy.  Attended individual and group therapy.  Medications were adjusted to treat bipolar depression symptoms by introducing Vraylar continuing Seroquel and Depakote at higher doses.  Medical issues were addressed by somewhat increasing antihypertensive medications.  Patient had some ups and downs but overall showed consistent improvement and for the last couple days has reported feeling back to baseline.  Did not have any complications related to alcohol withdrawal.  Shows good insight.  Reports that his mood is much better and denies suicidal ideation.  Prescriptions provided and will follow up with his usual outpatient  providers.  Physical Findings: AIMS:  , ,  ,  ,    CIWA:    COWS:     Musculoskeletal: Strength & Muscle Tone: within normal limits Gait & Station: normal Patient leans: N/A   Psychiatric Specialty Exam:  Presentation  General Appearance: Appropriate for Environment; Casual; Neat  Eye Contact:Fair  Speech:Clear and Coherent  Speech Volume:Normal  Handedness:Right   Mood and Affect  Mood:Anxious; Depressed  Affect:Constricted   Thought Process  Thought Processes:Coherent; Linear  Descriptions of Associations:Intact  Orientation:Full (Time, Place and Person)  Thought Content:Abstract Reasoning; Logical  History of Schizophrenia/Schizoaffective disorder:No  Duration of Psychotic Symptoms:No data recorded Hallucinations:No data recorded Ideas of Reference:None  Suicidal Thoughts:No data recorded Homicidal Thoughts:No data recorded  Sensorium  Memory:Immediate Fair; Remote  [place]  Insight:Fair   Executive Functions  Concentration:Fair  Attention Span:Fair  [place]   Psychomotor Activity  Psychomotor Activity:No data recorded  Assets  Assets:Communication Skills; Physical Health; Financial Resources/Insurance; Agricultural consultant; Housing   Sleep  Sleep:No data recorded   Physical Exam: Physical Exam Vitals and nursing note reviewed.  Constitutional:      Appearance: Normal appearance.  HENT:     Head: Normocephalic and atraumatic.     Mouth/Throat:     Pharynx: Oropharynx is clear.  Eyes:     Pupils: Pupils are equal, round, and reactive to light.  Cardiovascular:     Rate and Rhythm: Normal rate and regular rhythm.  Pulmonary:     Effort: Pulmonary effort is normal.     Breath sounds: Normal breath sounds.  Abdominal:     General: Abdomen is flat.     Palpations: Abdomen is soft.  Musculoskeletal:        General: Normal range of motion.  Skin:     General: Skin is warm and dry.  Neurological:     General: No focal deficit present.     Mental Status: He is alert. Mental status is at baseline.  Psychiatric:        Attention and Perception: Attention normal.        Mood and Affect: Mood normal.        Speech: Speech normal.        Behavior: Behavior normal.        Thought Content: Thought content normal.        Cognition and Memory: Cognition normal.   Review of Systems  Constitutional: Negative.   HENT: Negative.    Eyes: Negative.   Respiratory: Negative.    Cardiovascular: Negative.   Gastrointestinal: Negative.   Musculoskeletal: Negative.   Skin: Negative.   Neurological: Negative.   Psychiatric/Behavioral: Negative.    Blood pressure (!) 141/75, pulse 67, temperature 97.9 F (36.6 C), temperature source Oral, resp. rate 18, height 6' 4"  (1.93 m), weight (!) 149.2 kg, SpO2 94 %. Body mass index is 40.05 kg/m.   Social History   Tobacco Use  Smoking Status Every Day   Packs/day: 1.00   Types: Cigarettes  Smokeless Tobacco Former   Types: Chew   Tobacco Cessation:  N/A, patient does not currently use tobacco products   Blood Alcohol level:  Lab Results  Component Value Date   ETH 125 (H) 12/14/2021   ETH 272 (H) 59/16/3846    Metabolic Disorder Labs:  Lab Results  Component Value Date   HGBA1C 6.4 (H) 10/08/2021   MPG 136.98 10/08/2021   MPG 146 (H) 09/02/2012   No results found for: PROLACTIN Lab Results  Component Value Date   CHOL 166 12/18/2021   TRIG 146 12/18/2021   HDL 38 (L) 12/18/2021   CHOLHDL 4.4 12/18/2021   VLDL 29 12/18/2021   [place] 99 12/18/2021   [place] 90 09/02/2012    See Psychiatric Specialty Exam and Suicide Risk Assessment completed by Attending Physician prior to discharge.  Discharge destination:  Home  Is patient on multiple antipsychotic therapies at discharge:  Yes,   Do you recommend tapering to monotherapy for antipsychotics?  No   Has Patient had three or  more failed trials of antipsychotic monotherapy by history:  Yes,   Antipsychotic medications that previously failed include:   1.  Risperdal., 2.  Seroquel., and 3.  Aripiprazole.  Recommended Plan for Multiple  Antipsychotic Therapies: Do not recommend tapering.  Seroquel and Vraylar both appropriate for bipolar disorder  Discharge Instructions     Diet - low sodium heart healthy   Complete by: As directed    Increase activity slowly   Complete by: As directed       Allergies as of 12/26/2021       Reactions   Carbamazepine Hives   Adhesive [tape] Rash   Sulfa Antibiotics Rash        Medication List     STOP taking these medications    aspirin EC 81 MG tablet Replaced by: aspirin 81 MG chewable tablet   divalproex 500 MG 24 hr tablet Commonly known as: DEPAKOTE ER Replaced by: divalproex 500 MG DR tablet   ibuprofen 800 MG tablet Commonly known as: ADVIL   sertraline 25 MG tablet Commonly known as: ZOLOFT       TAKE these medications      Indication  albuterol 108 (90 Base) MCG/ACT inhaler Commonly known as: VENTOLIN HFA Inhale 2 puffs into the lungs every 6 (six) hours as needed for shortness of breath.  Indication: Chronic Obstructive Lung Disease   amLODipine 10 MG tablet Commonly known as: NORVASC Take 1 tablet (10 mg total) by mouth daily. Start taking on: Dec 27, 2021 What changed:  medication strength how much to take  Indication: High Blood Pressure Disorder   aspirin 81 MG chewable tablet Chew 1 tablet (81 mg total) by mouth daily. Start taking on: Dec 27, 2021 Replaces: aspirin EC 81 MG tablet  Indication: Stable Angina Pectoris   Cariprazine HCl 4.5 MG Caps Take 1 capsule (4.5 mg total) by mouth daily. Start taking on: Dec 27, 2021  Indication: Depressive Phase of Manic-Depression   divalproex 500 MG DR tablet Commonly known as: DEPAKOTE Take 2 tablets (1,000 mg total) by mouth 2 (two) times daily. Replaces: divalproex 500 MG 24  hr tablet  Indication: Depressive Phase of Manic-Depression   famotidine 40 MG tablet Commonly known as: PEPCID Take 1 tablet (40 mg total) by mouth daily. Start taking on: Dec 27, 2021  Indication: Gastroesophageal Reflux Disease   folic acid 1 MG tablet Commonly known as: FOLVITE Take 1 tablet (1 mg total) by mouth at bedtime.  Indication: Restless legs   gabapentin 400 MG capsule Commonly known as: NEURONTIN Take 1 capsule (400 mg total) by mouth 3 (three) times daily. What changed:  how much to take when to take this additional instructions  Indication: Abuse or Misuse of Alcohol   levothyroxine 175 MCG tablet Commonly known as: SYNTHROID Take 1 tablet (175 mcg total) by mouth daily at 6 (six) AM. Start taking on: Dec 27, 2021 What changed: when to take this  Indication: Underactive Thyroid   losartan 100 MG tablet Commonly known as: COZAAR Take 1 tablet (100 mg total) by mouth daily.  Indication: High Blood Pressure Disorder   magnesium oxide 400 (240 Mg) MG tablet Commonly known as: MAG-OX Take 1 tablet (400 mg total) by mouth at bedtime.  Indication: Restless legs   metFORMIN 1000 MG tablet Commonly known as: GLUCOPHAGE Take 1,000 mg by mouth 2 (two) times daily.  Indication: Type 2 Diabetes   metoprolol succinate 25 MG 24 hr tablet Commonly known as: TOPROL-XL Take 1 tablet (25 mg total) by mouth daily. Start taking on: Dec 27, 2021  Indication: High Blood Pressure Disorder   PARoxetine 20 MG tablet Commonly known as: PAXIL Take 1 tablet (20 mg total) by mouth  daily. Start taking on: Dec 27, 2021  Indication: Depressive Phase of Manic-Depression   QUEtiapine 400 MG tablet Commonly known as: SEROQUEL Take 2 tablets (800 mg total) by mouth at bedtime. What changed:  medication strength how much to take  Indication: Depressive Phase of Manic-Depression   rosuvastatin 5 MG tablet Commonly known as: CRESTOR Take 1 tablet (5 mg total) by mouth  daily. Start taking on: Dec 27, 2021  Indication: High Amount of Fats in the Blood   temazepam 15 MG capsule Commonly known as: RESTORIL Take 1 capsule (15 mg total) by mouth at bedtime.  Indication: Trouble Sleeping   thiamine 100 MG tablet Take 1 tablet (100 mg total) by mouth daily.  Indication: Deficiency of Vitamin B1        Follow-up Information     [facility]. Go on 02/05/2022.   Specialty: Behavioral Health Why: Please present for scheduled in person appointment on 02/05/2022 at 1445. Please bring intake paperwork, photo ID, and insurance card. Contact information: [place], [place]. Go on 01/07/2022.   Why: Please present for scheduled in person appointment on Monday 01/07/2022 at 1010 AM. Please bring hospital discharge paperwork to appointment. Contact information: [place] [address] [phone]         [facility] on 12/30/2021.   Why: Please present for WALK IN hour Monday 12/30/2021 at 0730. Clinic begins seeing patient at 0800 on a first come first serve basis. Contact information: [place] [address] [phone]                 Follow-up recommendations: Discharge home follow up with outpatient providers continue usual medicine  Comments: Prescriptions provided  Signed: Alethia Berthold, MD 12/26/2021, 10:45 AM

## 2021-12-27 NOTE — [facility] Suicide Risk Assessment (Signed)
[facility] INPATIENT:  Family/Significant Other Suicide Prevention Education  Suicide Prevention Education:  Patient Refusal for Family/Significant Other Suicide Prevention Education: The patient Rodney Leach has refused to provide written consent for family/significant other to be provided Family/Significant Other Suicide Prevention Education during admission and/or prior to discharge.  Physician notified.  CSW completed SPE with patient. Discussed potential triggers leading to suicidal ideation in addition to coping skills one might use in order to delay and distract self from self harming behaviors. CSW encouraged patient to utilize emergency services if they felt unable to maintain their safety. SPE flyer provided to patient at this time.   Durenda Hurt 12/27/2021, 10:37 AM

## 2021-12-30 ENCOUNTER — Encounter: Payer: Self-pay | Admitting: Cardiology

## 2021-12-30 ENCOUNTER — Ambulatory Visit (INDEPENDENT_AMBULATORY_CARE_PROVIDER_SITE_OTHER): Payer: BC Managed Care – PPO | Admitting: Cardiology

## 2021-12-30 VITALS — BP 132/76 | HR 77 | Ht 76.0 in | Wt 343.8 lb

## 2021-12-30 DIAGNOSIS — E782 Mixed hyperlipidemia: Secondary | ICD-10-CM

## 2021-12-30 DIAGNOSIS — E1169 Type 2 diabetes mellitus with other specified complication: Secondary | ICD-10-CM

## 2021-12-30 DIAGNOSIS — F17210 Nicotine dependence, cigarettes, uncomplicated: Secondary | ICD-10-CM | POA: Insufficient documentation

## 2021-12-30 DIAGNOSIS — I209 Angina pectoris, unspecified: Secondary | ICD-10-CM

## 2021-12-30 DIAGNOSIS — E785 Hyperlipidemia, unspecified: Secondary | ICD-10-CM | POA: Diagnosis not present

## 2021-12-30 DIAGNOSIS — E875 Hyperkalemia: Secondary | ICD-10-CM

## 2021-12-30 DIAGNOSIS — F1090 Alcohol use, unspecified, uncomplicated: Secondary | ICD-10-CM

## 2021-12-30 DIAGNOSIS — I10 Essential (primary) hypertension: Secondary | ICD-10-CM

## 2021-12-30 HISTORY — DX: Nicotine dependence, cigarettes, uncomplicated: F17.210

## 2021-12-30 HISTORY — DX: Morbid (severe) obesity due to excess calories: E66.01

## 2021-12-30 HISTORY — DX: Angina pectoris, unspecified: I20.9

## 2021-12-30 MED ORDER — NITROGLYCERIN 0.4 MG SL SUBL: 0.4000 mg | SUBLINGUAL_TABLET | SUBLINGUAL | 1 refills | Status: AC | PRN

## 2021-12-30 NOTE — Progress Notes (Signed)
Cardiology Office Note:    Date:  12/30/2021   ID:  Rodney Leach, DOB 05-03-1961, MRN [id number]  PCP:  Leonides Sake, MD  Cardiologist:  Jenean Lindau, MD   Referring MD: Leonides Sake, MD    ASSESSMENT:    1. Essential hypertension   2. Mixed hyperlipidemia   3. Type 2 diabetes mellitus with hyperlipidemia ([place])   4. Angina pectoris ([place])   5. Morbid obesity ([place])   6. Cigarette smoker   7. Alcohol use disorder    PLAN:    In order of problems listed above:  Angina pectoris: Patient's symptoms are very concerning and following recommendations were made to the patient.  Sublingual nitroglycerin prescription was sent, its protocol and 911 protocol explained and the patient vocalized understanding questions were answered to the patient's satisfaction.In view of the patient's symptoms, I discussed with the patient options for evaluation. Invasive and noninvasive options were given to the patient. I discussed stress testing and coronary angiography and left heart catheterization at length. Benefits, pros and cons of each approach were discussed at length. Patient had multiple questions which were answered to the patient's satisfaction. Patient opted for invasive evaluation and we will set up for coronary angiography and left heart catheterization. Further recommendations will be made based on the findings with coronary angiography. In the interim if the patient has any significant symptoms in hospital to the nearest emergency room.  I also mention CT coronary angiography but is not keen on it. Essential hypertension: Blood pressure stable and diet was emphasized. Mixed dyslipidemia, diabetes mellitus and obesity: Diet was emphasized.  Lifestyle modification urged.  I told her that it is extremely important that this be optimized otherwise he stands to deal with the consequences of adverse reactions such as myocardial infarction's atherosclerotic vascular disease including stroke  and such issues.  He promises to do better. Cigarette smoker: I spent 5 minutes with the patient discussing solely about smoking. Smoking cessation was counseled. I suggested to the patient also different medications and pharmacological interventions. Patient is keen to try stopping on its own at this time. He will get back to me if he needs any further assistance in this matter.  Alcohol use also also discouraged.  He was told to quit drinking alcohol and he promises to do so.  He will be seen in follow-up appointment and to call recheck.   Medication Adjustments/Labs and Tests Ordered: Current medicines are reviewed at length with the patient today.  Concerns regarding medicines are outlined above.  No orders of the defined types were placed in this encounter.  No orders of the defined types were placed in this encounter.    History of Present Illness:    Rodney Leach is a 61 y.o. male who is being seen today for the evaluation of chest pain at the request of [place], Lorin Mercy, MD. patient is a pleasant 61 year old male.  He is accompanied by his mother today.  Patient's gives history of essential hypertension dyslipidemia, diabetes mellitus, obesity, sedentary lifestyle and heavy cigarette smoking since young age.  He also has history of alcohol abuse and bipolar disorder.  He has orthopedic issues and therefore he cannot ambulate much.  Patient mentions to me that when he is under stress he has chest tightness radiated to the neck or to the back at times.  No orthopnea or PND.  This is concerned him.  When he rests he feels better.  At the time of my  evaluation he is alert awake oriented and in no distress.  At the time of my evaluation, the patient is alert awake oriented and in no distress.  Past Medical History:  Diagnosis Date   Adenomatous colon polyp    ADHD, adult residual type    Anxiety 09/03/2012   Adequate for discharge   Aortic atherosclerosis ([name])    Arthritis    Asthma     Atypical chest pain    Bipolar affective ([place])    Bipolar depression ([place])    Bipolar II disorder ([name])    CAD (coronary artery disease)    Carpal tunnel syndrome of right wrist 10/21/2017   Cervical disc displacement 03/24/2017   Formatting of this note might be different from the original. CERVICAL 5-6, CERVICAL 6-7   Chronic pain syndrome    Complication of anesthesia    has woken up during surgery before    Depression 09/03/2012   Adequate for discharge   Diabetes mellitus without complication ([place])    Emphysema/COPD ([place])    Essential hypertension 10/09/2021   Blood pressure is picking up.  Hypotensive on arrival.  Resume amlodipine today.  May not need to multiple antihypertensives.   GERD (gastroesophageal reflux disease)    Hepatic steatosis    Hypomagnesemia 10/09/2021   Replace aggressively today.  Recheck levels tomorrow including phosphorus and potassium.   Hypothyroidism    Impotence, organic    Irregular heart beat    Lumbar radiculopathy, chronic    Mixed hyperlipidemia    MRSA (methicillin resistant staph aureus) culture positive 08/08/2016   Formatting of this note might be different from the original. UPDATE : MRSA RE-SWAB FOR NEXT SURGERY ALSO +MRSA ON 03-24-17  + MRSA NASAL SWAB ON 08-08-16   Nodule of lower lobe of right lung    Obstructive sleep apnea 10/09/2021   Start using CPAP at night.  Wean off oxygen.   Pneumonia    Postlaminectomy syndrome of lumbosacral region 08/08/2016   PTSD (post-traumatic stress disorder)    Recurrent major depressive disorder ([place]) 01/30/2018   Sciatica 08/08/2016   Shock ([place]) 10/07/2021   Slurred speech 01/14/2013   Spinal stenosis 08/08/2016   Stroke ([place])    "light" stroke   Type 2 diabetes mellitus with complication, without long-term current use of insulin ([place]) 10/09/2021   On metformin at home.  Fairly controlled as per patient.  Holding metformin.  Currently on sliding scale insulin.  Resume metformin on discharge.   Type 2  diabetes mellitus with diabetic neuropathy ([name])    Type 2 diabetes mellitus with hyperlipidemia [facility])     Past Surgical History:  Procedure Laterality Date   BACK SURGERY     fusion L5/S1   COLONOSCOPY WITH PROPOFOL N/A 09/03/2017   Procedure: COLONOSCOPY WITH PROPOFOL;  Surgeon: [place]rence Spates, MD;  [facility] END[facility]COPY;  Service: Endoscopy;  Laterality: N/A;   HAND SURGERY     R & L    HERNIA REPAIR     umbilical   MULTIPLE TOOTH EXTRACTIONS     NOSE SURGERY     fracture repair   TOTAL KNEE ARTHROPLASTY  2012   Right   TOTAL KNEE ARTHROPLASTY  04/07/2012   Procedure: TOTAL KNEE ARTHROPLASTY;  Surgeon: Ninetta Lights, MD;  Location: [facility];  Service: Orthopedics;  Laterality: Left;  left total knee arthroplasty    Current Medications: Current Meds  Medication Sig   albuterol (VENTOLIN HFA) 108 (90 Base) MCG/ACT inhaler Inhale 2 puffs into the  lungs every 6 (six) hours as needed for shortness of breath.   amLODipine (NORVASC) 10 MG tablet Take 1 tablet (10 mg total) by mouth daily.   aspirin 81 MG chewable tablet Chew 1 tablet (81 mg total) by mouth daily.   cariprazine 4.5 MG CAPS Take 1 capsule (4.5 mg total) by mouth daily.   famotidine (PEPCID) 40 MG tablet Take 1 tablet (40 mg total) by mouth daily.   folic acid (FOLVITE) 1 MG tablet Take 1 tablet (1 mg total) by mouth at bedtime.   levothyroxine (SYNTHROID) 175 MCG tablet Take 1 tablet (175 mcg total) by mouth daily at 6 (six) AM.   losartan (COZAAR) 100 MG tablet Take 1 tablet (100 mg total) by mouth daily.   magnesium oxide (MAG-OX) 400 MG tablet Take 1 tablet by mouth at bedtime.   metFORMIN (GLUCOPHAGE) 1000 MG tablet Take 1,000 mg by mouth 2 (two) times daily.   metoprolol succinate (TOPROL-XL) 25 MG 24 hr tablet Take 1 tablet (25 mg total) by mouth daily.   PARoxetine (PAXIL) 20 MG tablet Take 1 tablet (20 mg total) by mouth daily.   QUEtiapine (SEROQUEL) 400 MG tablet Take 2 tablets (800 mg total) by mouth at  bedtime.   rosuvastatin (CRESTOR) 5 MG tablet Take 1 tablet (5 mg total) by mouth daily.   temazepam (RESTORIL) 15 MG capsule Take 1 capsule (15 mg total) by mouth at bedtime.   thiamine 100 MG tablet Take 1 tablet (100 mg total) by mouth daily.     Allergies:   Carbamazepine, Adhesive [tape], and Sulfa antibiotics   Social History   Socioeconomic History   Marital status: Married    Spouse name: Not on file   Number of children: Not on file   Years of education: Not on file   Highest education level: Not on file  Occupational History   Not on file  Tobacco Use   Smoking status: Every Day    Packs/day: 1.00    Types: Cigarettes   Smokeless tobacco: Former    Types: Nurse, children's Use: Never used  Substance and Sexual Activity   Alcohol use: Yes    Comment: daily   Drug use: No   Sexual activity: Yes  Other Topics Concern   Not on file  Social History Narrative   Not on file   Social Determinants of Health   Financial Resource Strain: Not on file  Food Insecurity: Not on file  Transportation Needs: Not on file  Physical Activity: Not on file  Stress: Not on file  Social Connections: Not on file     Family History: The patient's family history includes Diabetes in his mother; Hypertension in his father and mother.  ROS:   Please see the history of present illness.    All other systems reviewed and are negative.  EKGs/Labs/Other Studies Reviewed:    The following studies were reviewed today: EKG reveals sinus rhythm and nonspecific ST-T changes.   Recent Labs: 10/07/2021: B Natriuretic Peptide 100.6; TSH 2.658 10/10/2021: Magnesium 1.8 12/14/2021: ALT 30; BUN 12; Creatinine, Ser 0.90; Hemoglobin 16.0; Platelets 226; Potassium 4.2; Sodium 136  Recent Lipid Panel    Component Value Date/Time   CHOL 166 12/18/2021 0932   TRIG 146 12/18/2021 0932   HDL 38 (L) 12/18/2021 0932   CHOLHDL 4.4 12/18/2021 0932   VLDL 29 12/18/2021 0932   LDLCALC 99  12/18/2021 0932    Physical Exam:    VS:  BP 132/76   Pulse 77   Ht '6\' 4"'$  (1.93 m)   Wt (!) 343 lb 12.8 oz (155.9 kg)   SpO2 97%   BMI 41.85 kg/m     Wt Readings from Last 3 Encounters:  12/30/21 (!) 343 lb 12.8 oz (155.9 kg)  10/08/21 (!) 339 lb 15.2 oz (154.2 kg)  09/03/17 (!) 331 lb (150.1 kg)     GEN: Patient is in no acute distress HEENT: Normal NECK: No JVD; No carotid bruits LYMPHATICS: No lymphadenopathy CARDIAC: S1 S2 regular, 2/6 systolic murmur at the apex. RESPIRATORY:  Clear to auscultation without rales, wheezing or rhonchi  ABDOMEN: Soft, non-tender, non-distended MUSCULOSKELETAL:  No edema; No deformity  SKIN: Warm and dry NEUROLOGIC:  Alert and oriented x 3 PSYCHIATRIC:  Normal affect    Signed, Jenean Lindau, MD  12/30/2021 11:10 AM    [place]

## 2021-12-30 NOTE — Patient Instructions (Signed)
Medication Instructions:  Your physician has recommended you make the following change in your medication:   START: Nitroglycerin 0.4 mg under the tongue every 5 minutes as needed for chest pain.  *If you need a refill on your cardiac medications before your next appointment, please call your pharmacy*   Lab Work: Your physician recommends that you return for lab work in:   Labs today: BMP, CBC  If you have labs (blood work) drawn today and your tests are completely normal, you will receive your results only by: MyChart Message (if you have MyChart) OR A paper copy in the mail If you have any lab test that is abnormal or we need to change your treatment, we will call you to review the results.   Testing/Procedures:  [place] AT [address] [place] [address] Dept: [phone] Loc: [place]  12/30/2021  You are scheduled for a Cardiac Catheterization on Thursday, May 25 with Dr. Peter Martinique.  1. Please arrive at the Main Entrance A at [facility]: [place],  [id number] at 7:00 AM (This time is two hours before your procedure to ensure your preparation). Free valet parking service is available.   Special note: Every effort is made to have your procedure done on time. Please understand that emergencies sometimes delay scheduled procedures.  2. Diet: Do not eat solid foods after midnight.  You may have clear liquids until 5 AM upon the day of the procedure.  3. Labs: You will need to have blood drawn on Monday, May 22 at [employer] [facility]: [address] Technical sales engineer . You do not need to be fasting.  4. Medication instructions in preparation for your procedure:   Contrast Allergy: No  Do not take Diabetes Med Glucophage (Metformin) on the day of the procedure and HOLD 48 HOURS AFTER THE PROCEDURE.  On the morning of your procedure, take Aspirin and  any morning medicines NOT listed above.  You may use sips of water.  5. Plan to go home the same day, you will only stay overnight if medically necessary. 6. You MUST have a responsible adult to drive you home. 7. An adult MUST be with you the first 24 hours after you arrive home. 8. Bring a current list of your medications, and the last time and date medication taken. 9. Bring ID and current insurance cards. 10.Please wear clothes that are easy to get on and off and wear slip-on shoes.  Thank you for allowing [place] to care for you!   -- [facility]    Follow-Up: At [facility], you and your health needs are our priority.  As part of our continuing mission to provide you with exceptional heart care, we have created designated Provider Care Teams.  These Care Teams include your primary Cardiologist (physician) and Advanced Practice Providers (APPs -  Physician Assistants and Nurse Practitioners) who all work together to provide you with the care you need, when you need it.  We recommend signing up for the patient portal called "MyChart".  Sign up information is provided on this After Visit Summary.  MyChart is used to connect with patients for Virtual Visits (Telemedicine).  Patients are able to view lab/test results, encounter notes, upcoming appointments, etc.  Non-urgent messages can be sent to your provider as well.   To learn more about what you can do with MyChart, go to [email or web address].  Your next appointment:   3 month(s)  The format for your next appointment:   In Person  Provider:   Jyl Heinz, MD    Other Instructions None  Important Information About Sugar

## 2021-12-30 NOTE — H&P (View-Only) (Signed)
Cardiology Office Note:    Date:  12/30/2021   ID:  Rodney Leach, DOB 1960-11-10, MRN [id number]  PCP:  Leonides Sake, MD  Cardiologist:  Jenean Lindau, MD   Referring MD: Leonides Sake, MD    ASSESSMENT:    1. Essential hypertension   2. Mixed hyperlipidemia   3. Type 2 diabetes mellitus with hyperlipidemia ([place])   4. Angina pectoris ([place])   5. Morbid obesity ([place])   6. Cigarette smoker   7. Alcohol use disorder    PLAN:    In order of problems listed above:  Angina pectoris: Patient's symptoms are very concerning and following recommendations were made to the patient.  Sublingual nitroglycerin prescription was sent, its protocol and 911 protocol explained and the patient vocalized understanding questions were answered to the patient's satisfaction.In view of the patient's symptoms, I discussed with the patient options for evaluation. Invasive and noninvasive options were given to the patient. I discussed stress testing and coronary angiography and left heart catheterization at length. Benefits, pros and cons of each approach were discussed at length. Patient had multiple questions which were answered to the patient's satisfaction. Patient opted for invasive evaluation and we will set up for coronary angiography and left heart catheterization. Further recommendations will be made based on the findings with coronary angiography. In the interim if the patient has any significant symptoms in hospital to the nearest emergency room.  I also mention CT coronary angiography but is not keen on it. Essential hypertension: Blood pressure stable and diet was emphasized. Mixed dyslipidemia, diabetes mellitus and obesity: Diet was emphasized.  Lifestyle modification urged.  I told her that it is extremely important that this be optimized otherwise he stands to deal with the consequences of adverse reactions such as myocardial infarction's atherosclerotic vascular disease including stroke  and such issues.  He promises to do better. Cigarette smoker: I spent 5 minutes with the patient discussing solely about smoking. Smoking cessation was counseled. I suggested to the patient also different medications and pharmacological interventions. Patient is keen to try stopping on its own at this time. He will get back to me if he needs any further assistance in this matter.  Alcohol use also also discouraged.  He was told to quit drinking alcohol and he promises to do so.  He will be seen in follow-up appointment and to call recheck.   Medication Adjustments/Labs and Tests Ordered: Current medicines are reviewed at length with the patient today.  Concerns regarding medicines are outlined above.  No orders of the defined types were placed in this encounter.  No orders of the defined types were placed in this encounter.    History of Present Illness:    Rodney Leach is a 61 y.o. male who is being seen today for the evaluation of chest pain at the request of Hamrick, Lorin Mercy, MD. patient is a pleasant 61 year old male.  He is accompanied by his mother today.  Patient's gives history of essential hypertension dyslipidemia, diabetes mellitus, obesity, sedentary lifestyle and heavy cigarette smoking since young age.  He also has history of alcohol abuse and bipolar disorder.  He has orthopedic issues and therefore he cannot ambulate much.  Patient mentions to me that when he is under stress he has chest tightness radiated to the neck or to the back at times.  No orthopnea or PND.  This is concerned him.  When he rests he feels better.  At the time of my  evaluation he is alert awake oriented and in no distress.  At the time of my evaluation, the patient is alert awake oriented and in no distress.  Past Medical History:  Diagnosis Date   Adenomatous colon polyp    ADHD, adult residual type    Anxiety 09/03/2012   Adequate for discharge   Aortic atherosclerosis ([name])    Arthritis    Asthma     Atypical chest pain    Bipolar affective ([place])    Bipolar depression ([place])    Bipolar II disorder ([name])    CAD (coronary artery disease)    Carpal tunnel syndrome of right wrist 10/21/2017   Cervical disc displacement 03/24/2017   Formatting of this note might be different from the original. CERVICAL 5-6, CERVICAL 6-7   Chronic pain syndrome    Complication of anesthesia    has woken up during surgery before    Depression 09/03/2012   Adequate for discharge   Diabetes mellitus without complication ([place])    Emphysema/COPD ([place])    Essential hypertension 10/09/2021   Blood pressure is picking up.  Hypotensive on arrival.  Resume amlodipine today.  May not need to multiple antihypertensives.   GERD (gastroesophageal reflux disease)    Hepatic steatosis    Hypomagnesemia 10/09/2021   Replace aggressively today.  Recheck levels tomorrow including phosphorus and potassium.   Hypothyroidism    Impotence, organic    Irregular heart beat    Lumbar radiculopathy, chronic    Mixed hyperlipidemia    MRSA (methicillin resistant staph aureus) culture positive 08/08/2016   Formatting of this note might be different from the original. UPDATE : MRSA RE-SWAB FOR NEXT SURGERY ALSO +MRSA ON 03-24-17  + MRSA NASAL SWAB ON 08-08-16   Nodule of lower lobe of right lung    Obstructive sleep apnea 10/09/2021   Start using CPAP at night.  Wean off oxygen.   Pneumonia    Postlaminectomy syndrome of lumbosacral region 08/08/2016   PTSD (post-traumatic stress disorder)    Recurrent major depressive disorder ([place]) 01/30/2018   Sciatica 08/08/2016   Shock ([place]) 10/07/2021   Slurred speech 01/14/2013   Spinal stenosis 08/08/2016   Stroke ([place])    "light" stroke   Type 2 diabetes mellitus with complication, without long-term current use of insulin ([place]) 10/09/2021   On metformin at home.  Fairly controlled as per patient.  Holding metformin.  Currently on sliding scale insulin.  Resume metformin on discharge.   Type 2  diabetes mellitus with diabetic neuropathy ([name])    Type 2 diabetes mellitus with hyperlipidemia [facility])     Past Surgical History:  Procedure Laterality Date   BACK SURGERY     fusion L5/S1   COLONOSCOPY WITH PROPOFOL N/A 09/03/2017   Procedure: COLONOSCOPY WITH PROPOFOL;  Surgeon: Laurence Spates, MD;  Location[facility] ENDOSCOPY;  Service: Endoscopy;  Laterality: N/A;   HAND SURGERY     R & L    HERNIA REPAIR     umbilical   MULTIPLE TOOTH EXTRACTIONS     NOSE SURGERY     fracture repair   TOTAL KNEE ARTHROPLASTY  2012   Right   TOTAL KNEE ARTHROPLASTY  04/07/2012   Procedure: TOTAL KNEE ARTHROPLASTY;  Surgeon: Ninetta Lights, MD;  Location: [place];  Service: Orthopedics;  Laterality: Left;  left total knee arthroplasty    Current Medications: Current Meds  Medication Sig   albuterol (VENTOLIN HFA) 108 (90 Base) MCG/ACT inhaler Inhale 2 puffs into the  lungs every 6 (six) hours as needed for shortness of breath.   amLODipine (NORVASC) 10 MG tablet Take 1 tablet (10 mg total) by mouth daily.   aspirin 81 MG chewable tablet Chew 1 tablet (81 mg total) by mouth daily.   cariprazine 4.5 MG CAPS Take 1 capsule (4.5 mg total) by mouth daily.   famotidine (PEPCID) 40 MG tablet Take 1 tablet (40 mg total) by mouth daily.   folic acid (FOLVITE) 1 MG tablet Take 1 tablet (1 mg total) by mouth at bedtime.   levothyroxine (SYNTHROID) 175 MCG tablet Take 1 tablet (175 mcg total) by mouth daily at 6 (six) AM.   losartan (COZAAR) 100 MG tablet Take 1 tablet (100 mg total) by mouth daily.   magnesium oxide (MAG-OX) 400 MG tablet Take 1 tablet by mouth at bedtime.   metFORMIN (GLUCOPHAGE) 1000 MG tablet Take 1,000 mg by mouth 2 (two) times daily.   metoprolol succinate (TOPROL-XL) 25 MG 24 hr tablet Take 1 tablet (25 mg total) by mouth daily.   PARoxetine (PAXIL) 20 MG tablet Take 1 tablet (20 mg total) by mouth daily.   QUEtiapine (SEROQUEL) 400 MG tablet Take 2 tablets (800 mg total) by mouth at  bedtime.   rosuvastatin (CRESTOR) 5 MG tablet Take 1 tablet (5 mg total) by mouth daily.   temazepam (RESTORIL) 15 MG capsule Take 1 capsule (15 mg total) by mouth at bedtime.   thiamine 100 MG tablet Take 1 tablet (100 mg total) by mouth daily.     Allergies:   Carbamazepine, Adhesive [tape], and Sulfa antibiotics   Social History   Socioeconomic History   Marital status: Married    Spouse name: Not on file   Number of children: Not on file   Years of education: Not on file   Highest education level: Not on file  Occupational History   Not on file  Tobacco Use   Smoking status: Every Day    Packs/day: 1.00    Types: Cigarettes   Smokeless tobacco: Former    Types: Nurse, children's Use: Never used  Substance and Sexual Activity   Alcohol use: Yes    Comment: daily   Drug use: No   Sexual activity: Yes  Other Topics Concern   Not on file  Social History Narrative   Not on file   Social Determinants of Health   Financial Resource Strain: Not on file  Food Insecurity: Not on file  Transportation Needs: Not on file  Physical Activity: Not on file  Stress: Not on file  Social Connections: Not on file     Family History: The patient's family history includes Diabetes in his mother; Hypertension in his father and mother.  ROS:   Please see the history of present illness.    All other systems reviewed and are negative.  EKGs/Labs/Other Studies Reviewed:    The following studies were reviewed today: EKG reveals sinus rhythm and nonspecific ST-T changes.   Recent Labs: 10/07/2021: B Natriuretic Peptide 100.6; TSH 2.658 10/10/2021: Magnesium 1.8 12/14/2021: ALT 30; BUN 12; Creatinine, Ser 0.90; Hemoglobin 16.0; Platelets 226; Potassium 4.2; Sodium 136  Recent Lipid Panel    Component Value Date/Time   CHOL 166 12/18/2021 0932   TRIG 146 12/18/2021 0932   HDL 38 (L) 12/18/2021 0932   CHOLHDL 4.4 12/18/2021 0932   VLDL 29 12/18/2021 0932   LDLCALC 99  12/18/2021 0932    Physical Exam:    VS:  BP 132/76   Pulse 77   Ht '6\' 4"'$  (1.93 m)   Wt (!) 343 lb 12.8 oz (155.9 kg)   SpO2 97%   BMI 41.85 kg/m     Wt Readings from Last 3 Encounters:  12/30/21 (!) 343 lb 12.8 oz (155.9 kg)  10/08/21 (!) 339 lb 15.2 oz (154.2 kg)  09/03/17 (!) 331 lb (150.1 kg)     GEN: Patient is in no acute distress HEENT: Normal NECK: No JVD; No carotid bruits LYMPHATICS: No lymphadenopathy CARDIAC: S1 S2 regular, 2/6 systolic murmur at the apex. RESPIRATORY:  Clear to auscultation without rales, wheezing or rhonchi  ABDOMEN: Soft, non-tender, non-distended MUSCULOSKELETAL:  No edema; No deformity  SKIN: Warm and dry NEUROLOGIC:  Alert and oriented x 3 PSYCHIATRIC:  Normal affect    Signed, Jenean Lindau, MD  12/30/2021 11:10 AM    [place]

## 2021-12-31 LAB — CBC
Hematocrit: 47.8 % (ref 37.5–51.0)
Hemoglobin: 16 g/dL (ref 13.0–17.7)
MCH: 31.6 pg (ref 26.6–33.0)
MCHC: 33.5 g/dL (ref 31.5–35.7)
MCV: 95 fL (ref 79–97)
Platelets: 285 x10E3/uL (ref 150–450)
RBC: 5.06 x10E6/uL (ref 4.14–5.80)
RDW: 13.1 % (ref 11.6–15.4)
WBC: 9.9 x10E3/uL (ref 3.4–10.8)

## 2021-12-31 LAB — BASIC METABOLIC PANEL
BUN/Creatinine Ratio: 14 (ref 10–24)
BUN: 14 mg/dL (ref 8–27)
CO2: 25 mmol/L (ref 20–29)
Calcium: 9.9 mg/dL (ref 8.6–10.2)
Chloride: 92 mmol/L — ABNORMAL LOW (ref 96–106)
Creatinine, Ser: 0.99 mg/dL (ref 0.76–1.27)
Glucose: 301 mg/dL — ABNORMAL HIGH (ref 70–99)
Potassium: 5.5 mmol/L — ABNORMAL HIGH (ref 3.5–5.2)
Sodium: 131 mmol/L — ABNORMAL LOW (ref 134–144)
eGFR: 86 mL/min/{1.73_m2} (ref >59)

## 2022-01-01 ENCOUNTER — Telehealth: Payer: Self-pay | Admitting: *Deleted

## 2022-01-01 NOTE — Telephone Encounter (Addendum)
Cardiac Catheterization scheduled at [facility] for: Thursday Jan 02, 2022 9 AM Arrival time and place: [facility] Main Entrance A at: 7 AM   Nothing to eat after midnight prior to procedure, clear liquids until 5 AM day of procedure.  Medication instructions: -Hold:  Metformin-day of procedure and 48 hours post procedure -Except hold medications usual morning medications can be taken with sips of water including aspirin 81 mg.  Confirmed patient has responsible adult to drive home post procedure and be with patient first 24 hours after arriving home.  Patient reports no new symptoms concerning for COVID-19/no exposure to COVID-19 in the past 10 days.  Reviewed procedure instructions with patient.

## 2022-01-02 ENCOUNTER — Ambulatory Visit (HOSPITAL_COMMUNITY)
Admission: RE | Admit: 2022-01-02 | Discharge: 2022-01-02 | Disposition: A | Discharge status: Home / Self Care | Payer: BC Managed Care – PPO | Attending: Cardiology | Admitting: Cardiology

## 2022-01-02 ENCOUNTER — Encounter (HOSPITAL_COMMUNITY)
Admission: RE | Disposition: A | Discharge status: Home / Self Care | Payer: Self-pay | Source: Home / Self Care | Attending: Cardiology

## 2022-01-02 ENCOUNTER — Other Ambulatory Visit: Payer: Self-pay

## 2022-01-02 DIAGNOSIS — E785 Hyperlipidemia, unspecified: Secondary | ICD-10-CM

## 2022-01-02 DIAGNOSIS — Z6841 Body Mass Index (BMI) 40.0 and over, adult: Secondary | ICD-10-CM | POA: Diagnosis not present

## 2022-01-02 DIAGNOSIS — Z7984 Long term (current) use of oral hypoglycemic drugs: Secondary | ICD-10-CM | POA: Diagnosis not present

## 2022-01-02 DIAGNOSIS — F1010 Alcohol abuse, uncomplicated: Secondary | ICD-10-CM | POA: Insufficient documentation

## 2022-01-02 DIAGNOSIS — I10 Essential (primary) hypertension: Secondary | ICD-10-CM | POA: Diagnosis not present

## 2022-01-02 DIAGNOSIS — E1169 Type 2 diabetes mellitus with other specified complication: Secondary | ICD-10-CM | POA: Diagnosis not present

## 2022-01-02 DIAGNOSIS — E782 Mixed hyperlipidemia: Secondary | ICD-10-CM | POA: Diagnosis not present

## 2022-01-02 DIAGNOSIS — F17210 Nicotine dependence, cigarettes, uncomplicated: Secondary | ICD-10-CM

## 2022-01-02 DIAGNOSIS — F1090 Alcohol use, unspecified, uncomplicated: Secondary | ICD-10-CM

## 2022-01-02 DIAGNOSIS — I209 Angina pectoris, unspecified: Secondary | ICD-10-CM

## 2022-01-02 DIAGNOSIS — R079 Chest pain, unspecified: Secondary | ICD-10-CM

## 2022-01-02 DIAGNOSIS — I25119 Atherosclerotic heart disease of native coronary artery with unspecified angina pectoris: Secondary | ICD-10-CM | POA: Insufficient documentation

## 2022-01-02 HISTORY — PX: LEFT HEART CATH AND CORONARY ANGIOGRAPHY: CATH118249

## 2022-01-02 LAB — BASIC METABOLIC PANEL
Anion gap: 8 (ref 5–15)
BUN: 15 mg/dL (ref 8–23)
CO2: 28 mmol/L (ref 22–32)
Calcium: 8.7 mg/dL — ABNORMAL LOW (ref 8.9–10.3)
Chloride: 94 mmol/L — ABNORMAL LOW (ref 98–111)
Creatinine, Ser: 1.03 mg/dL (ref 0.61–1.24)
GFR, Estimated: >60 mL/min (ref >60)
Glucose, Bld: 365 mg/dL — ABNORMAL HIGH (ref 70–99)
Potassium: 4.2 mmol/L (ref 3.5–5.1)
Sodium: 130 mmol/L — ABNORMAL LOW (ref 135–145)

## 2022-01-02 LAB — GLUCOSE, CAPILLARY
Glucose-Capillary: 342 mg/dL — ABNORMAL HIGH (ref 70–99)
Glucose-Capillary: 328 mg/dL — ABNORMAL HIGH (ref 70–99)

## 2022-01-02 SURGERY — LEFT HEART CATH AND CORONARY ANGIOGRAPHY
Anesthesia: LOCAL

## 2022-01-02 MED ORDER — FENTANYL CITRATE (PF) 100 MCG/2ML IJ SOLN
INTRAMUSCULAR | Status: DC | PRN
  Administered 2022-01-02: 25 ug via INTRAVENOUS

## 2022-01-02 MED ORDER — SODIUM CHLORIDE 0.9% FLUSH
3.0000 mL | INTRAVENOUS | Status: DC | PRN
Start: 2022-01-02 — End: 2022-01-02

## 2022-01-02 MED ORDER — SODIUM CHLORIDE 0.9% FLUSH: 3.0000 mL | Freq: Two times a day (BID) | INTRAVENOUS | Status: DC

## 2022-01-02 MED ORDER — ONDANSETRON HCL 4 MG/2ML IJ SOLN: 4.0000 mg | Freq: Four times a day (QID) | INTRAMUSCULAR | Status: DC | PRN

## 2022-01-02 MED ORDER — INSULIN ASPART 100 UNIT/ML IJ SOLN
5.0000 [IU] | Freq: Once | INTRAMUSCULAR | Status: AC
  Administered 2022-01-02: 5 [IU] via SUBCUTANEOUS
  Filled 2022-01-02: qty 1

## 2022-01-02 MED ORDER — ASPIRIN 81 MG PO CHEW: 81.0000 mg | CHEWABLE_TABLET | ORAL | Status: DC

## 2022-01-02 MED ORDER — SODIUM CHLORIDE 0.9 % IV SOLN: 250.0000 mL | INTRAVENOUS | Status: DC | PRN

## 2022-01-02 MED ORDER — SODIUM CHLORIDE 0.9 % WEIGHT BASED INFUSION
1.0000 mL/kg/h | INTRAVENOUS | Status: DC
  Administered 2022-01-02: 1 mL/kg/h via INTRAVENOUS

## 2022-01-02 MED ORDER — HEPARIN SODIUM (PORCINE) 1000 UNIT/ML IJ SOLN
INTRAMUSCULAR | Status: DC | PRN
  Administered 2022-01-02: 6000 [IU] via INTRAVENOUS

## 2022-01-02 MED ORDER — VERAPAMIL HCL 2.5 MG/ML IV SOLN
INTRAVENOUS | Status: AC
  Filled 2022-01-02: qty 2

## 2022-01-02 MED ORDER — SODIUM CHLORIDE 0.9 % WEIGHT BASED INFUSION
3.0000 mL/kg/h | INTRAVENOUS | Status: AC
  Administered 2022-01-02: 3 mL/kg/h via INTRAVENOUS

## 2022-01-02 MED ORDER — IOHEXOL 350 MG/ML SOLN
INTRAVENOUS | Status: DC | PRN
  Administered 2022-01-02: 55 mL

## 2022-01-02 MED ORDER — HEPARIN SODIUM (PORCINE) 1000 UNIT/ML IJ SOLN
INTRAMUSCULAR | Status: AC
  Filled 2022-01-02: qty 10

## 2022-01-02 MED ORDER — HYDRALAZINE HCL 20 MG/ML IJ SOLN: 10.0000 mg | INTRAMUSCULAR | Status: DC | PRN

## 2022-01-02 MED ORDER — MIDAZOLAM HCL 2 MG/2ML IJ SOLN
INTRAMUSCULAR | Status: DC | PRN
Start: 2022-01-02 — End: 2022-01-02
  Administered 2022-01-02: 2 mg via INTRAVENOUS

## 2022-01-02 MED ORDER — FENTANYL CITRATE (PF) 100 MCG/2ML IJ SOLN
INTRAMUSCULAR | Status: AC
  Filled 2022-01-02: qty 2

## 2022-01-02 MED ORDER — HEPARIN (PORCINE) IN NACL 1000-0.9 UT/500ML-% IV SOLN
INTRAVENOUS | Status: DC | PRN
  Administered 2022-01-02 (×2): 500 mL

## 2022-01-02 MED ORDER — LIDOCAINE HCL (PF) 1 % IJ SOLN
INTRAMUSCULAR | Status: DC | PRN
  Administered 2022-01-02: 2 mL

## 2022-01-02 MED ORDER — HEPARIN (PORCINE) IN NACL 1000-0.9 UT/500ML-% IV SOLN
INTRAVENOUS | Status: AC
  Filled 2022-01-02: qty 1000

## 2022-01-02 MED ORDER — ACETAMINOPHEN 325 MG PO TABS: 650.0000 mg | ORAL_TABLET | ORAL | Status: DC | PRN

## 2022-01-02 MED ORDER — VERAPAMIL HCL 2.5 MG/ML IV SOLN
INTRAVENOUS | Status: DC | PRN
  Administered 2022-01-02: 10 mL via INTRA_ARTERIAL

## 2022-01-02 MED ORDER — MIDAZOLAM HCL 2 MG/2ML IJ SOLN
INTRAMUSCULAR | Status: AC
  Filled 2022-01-02: qty 2

## 2022-01-02 MED ORDER — LIDOCAINE HCL (PF) 1 % IJ SOLN
INTRAMUSCULAR | Status: AC
  Filled 2022-01-02: qty 30

## 2022-01-02 MED ORDER — SODIUM CHLORIDE 0.9% FLUSH: 3.0000 mL | INTRAVENOUS | Status: DC | PRN

## 2022-01-02 MED ORDER — SODIUM CHLORIDE 0.9 % IV SOLN: INTRAVENOUS | Status: DC

## 2022-01-02 MED ORDER — METFORMIN HCL 1000 MG PO TABS: 1000.0000 mg | ORAL_TABLET | Freq: Two times a day (BID) | ORAL | Status: AC

## 2022-01-02 SURGICAL SUPPLY — 11 items
BAND CMPR LRG ZPHR (HEMOSTASIS) ×1
BAND ZEPHYR COMPRESS 30 LONG (HEMOSTASIS) ×1 IMPLANT
CATH INFINITI JR4 5F (CATHETERS) ×1 IMPLANT
CATH JL3.5 FR DIAG (CATHETERS) ×1 IMPLANT
GLIDESHEATH SLEND SS 6F .021 (SHEATH) ×1 IMPLANT
GUIDEWIRE INQWIRE 1.5J.035X260 (WIRE) IMPLANT
INQWIRE 1.5J .035X260CM (WIRE) ×4
KIT HEART LEFT (KITS) ×2 IMPLANT
PACK CARDIAC CATHETERIZATION (CUSTOM PROCEDURE TRAY) ×2 IMPLANT
TRANSDUCER W/STOPCOCK (MISCELLANEOUS) ×2 IMPLANT
TUBING CIL FLEX 10 FLL-RA (TUBING) ×2 IMPLANT

## 2022-01-02 NOTE — Interval H&P Note (Signed)
History and Physical Interval Note:  01/02/2022 9:28 AM  Rodney Leach  has presented today for surgery, with the diagnosis of cad.  The various methods of treatment have been discussed with the patient and family. After consideration of risks, benefits and other options for treatment, the patient has consented to  Procedure(s): LEFT HEART CATH AND CORONARY ANGIOGRAPHY (N/A) as a surgical intervention.  The patient's history has been reviewed, patient examined, no change in status, stable for surgery.  I have reviewed the patient's chart and labs.  Questions were answered to the patient's satisfaction.   Cath Lab Visit (complete for each Cath Lab visit)  Clinical Evaluation Leading to the Procedure:   ACS: No.  Non-ACS:    Anginal Classification: CCS III  Anti-ischemic medical therapy: Maximal Therapy (2 or more classes of medications)  Non-Invasive Test Results: No non-invasive testing performed  Prior CABG: No previous CABG        Collier Salina [facility] 01/02/2022 9:28 AM

## 2022-01-03 ENCOUNTER — Ambulatory Visit (INDEPENDENT_AMBULATORY_CARE_PROVIDER_SITE_OTHER): Payer: BC Managed Care – PPO | Admitting: Podiatry

## 2022-01-03 ENCOUNTER — Encounter: Payer: Self-pay | Admitting: Podiatry

## 2022-01-03 VITALS — Temp 97.7°F

## 2022-01-03 DIAGNOSIS — L97522 Non-pressure chronic ulcer of other part of left foot with fat layer exposed: Secondary | ICD-10-CM

## 2022-01-03 MED ORDER — SODIUM POLYSTYRENE SULFONATE PO POWD: Freq: Once | ORAL | 0 refills | Status: AC

## 2022-01-03 NOTE — Progress Notes (Signed)
Subjective:  Patient presents today status post second metatarsal head resection with debridement of ulcer left foot. DOS: 11/14/2021.  No new complaints today.  Patient believes that the wound is mostly healed.  He presents for follow-up treatment and evaluation  Past Medical History:  Diagnosis Date   Adenomatous colon polyp    ADHD, adult residual type    Anxiety 09/03/2012   Adequate for discharge   Aortic atherosclerosis ([name])    Arthritis    Asthma    Atypical chest pain    Bipolar affective ([name])    Bipolar depression ([name])    Bipolar II disorder ([name])    CAD (coronary artery disease)    Carpal tunnel syndrome of right wrist 10/21/2017   Cervical disc displacement 03/24/2017   Formatting of this note might be different from the original. CERVICAL 5-6, CERVICAL 6-7   Chronic pain syndrome    Complication of anesthesia    has woken up during surgery before    Depression 09/03/2012   Adequate for discharge   Diabetes mellitus without complication ([place])    Emphysema/COPD ([place])    Essential hypertension 10/09/2021   Blood pressure is picking up.  Hypotensive on arrival.  Resume amlodipine today.  May not need to multiple antihypertensives.   GERD (gastroesophageal reflux disease)    Hepatic steatosis    Hypomagnesemia 10/09/2021   Replace aggressively today.  Recheck levels tomorrow including phosphorus and potassium.   Hypothyroidism    Impotence, organic    Irregular heart beat    Lumbar radiculopathy, chronic    Mixed hyperlipidemia    MRSA (methicillin resistant staph aureus) culture positive 08/08/2016   Formatting of this note might be different from the original. UPDATE : MRSA RE-SWAB FOR NEXT SURGERY ALSO +MRSA ON 03-24-17  + MRSA NASAL SWAB ON 08-08-16   Nodule of lower lobe of right lung    Obstructive sleep apnea 10/09/2021   Start using CPAP at night.  Wean off oxygen.   Pneumonia    Postlaminectomy syndrome of lumbosacral region 08/08/2016   PTSD (post-traumatic  stress disorder)    Recurrent major depressive disorder ([place]) 01/30/2018   Sciatica 08/08/2016   Shock ([place]) 10/07/2021   Slurred speech 01/14/2013   Spinal stenosis 08/08/2016   Stroke ([place])    "light" stroke   Type 2 diabetes mellitus with complication, without long-term current use of insulin ([place]) 10/09/2021   On metformin at home.  Fairly controlled as per patient.  Holding metformin.  Currently on sliding scale insulin.  Resume metformin on discharge.   Type 2 diabetes mellitus with diabetic neuropathy ([name])    Type 2 diabetes mellitus with hyperlipidemia ([place])     Objective/Physical Exam Neurovascular status intact.  Skin incisions healed.  No dehiscence.  No erythema or edema noted.  Ulcer to the plantar aspect of the left foot has healed.  After debridement of the right superficial eschar there is complete reepithelialization.  Assessment: 1. s/p second metatarsal head resection with debridement of ulcer left foot. DOS: 11/29/2021 2.  Ulcer left foot fat layer exposed secondary to diabetes mellitus   Plan of Care:  1. Patient was evaluated.  2.  Light debridement of the superficial eschar/scab to the plantar aspect of the foot was debrided away.  Healthy underlying skin with no bleeding.  The wound is completely healed 3.  Patient may discontinue the postsurgical shoe 4.  Recommend good supportive shoes and sneakers. 5.  Return to clinic 3 months for routine foot  care  Edrick Kins, DPM [facility]  Dr. Edrick Kins, DPM    [address], [place] [address]                Office [phone]  Fax [phone]

## 2022-01-03 NOTE — Addendum Note (Signed)
Addended by: Truddie Hidden on: 01/03/2022 01:29 PM   Modules accepted: Orders

## 2022-01-04 ENCOUNTER — Telehealth (HOSPITAL_COMMUNITY): Payer: Self-pay | Admitting: Family Medicine

## 2022-01-04 NOTE — BH Assessment (Signed)
Care Management - Follow Up [facility] Discharges   Patient has been placed in an inpatient psychiatric hospital [facility] ) on 12-17-2021.

## 2022-01-07 DIAGNOSIS — F3162 Bipolar disorder, current episode mixed, moderate: Secondary | ICD-10-CM | POA: Diagnosis not present

## 2022-01-07 DIAGNOSIS — F1010 Alcohol abuse, uncomplicated: Secondary | ICD-10-CM | POA: Diagnosis not present

## 2022-01-17 DIAGNOSIS — F1010 Alcohol abuse, uncomplicated: Secondary | ICD-10-CM | POA: Diagnosis not present

## 2022-01-17 DIAGNOSIS — F3162 Bipolar disorder, current episode mixed, moderate: Secondary | ICD-10-CM | POA: Diagnosis not present

## 2022-01-18 ENCOUNTER — Emergency Department (HOSPITAL_COMMUNITY): Payer: BC Managed Care – PPO

## 2022-01-18 ENCOUNTER — Inpatient Hospital Stay (HOSPITAL_COMMUNITY)
Admission: EM | Admit: 2022-01-18 | Discharge: 2022-01-20 | DRG: 871 | Disposition: A | Discharge status: Home / Self Care | Payer: BC Managed Care – PPO | Attending: Internal Medicine | Admitting: Internal Medicine

## 2022-01-18 ENCOUNTER — Other Ambulatory Visit: Payer: Self-pay

## 2022-01-18 ENCOUNTER — Inpatient Hospital Stay (HOSPITAL_COMMUNITY): Payer: BC Managed Care – PPO

## 2022-01-18 DIAGNOSIS — R4182 Altered mental status, unspecified: Secondary | ICD-10-CM | POA: Diagnosis not present

## 2022-01-18 DIAGNOSIS — F17210 Nicotine dependence, cigarettes, uncomplicated: Secondary | ICD-10-CM | POA: Diagnosis present

## 2022-01-18 DIAGNOSIS — Z833 Family history of diabetes mellitus: Secondary | ICD-10-CM | POA: Diagnosis not present

## 2022-01-18 DIAGNOSIS — E1140 Type 2 diabetes mellitus with diabetic neuropathy, unspecified: Secondary | ICD-10-CM | POA: Diagnosis not present

## 2022-01-18 DIAGNOSIS — I2510 Atherosclerotic heart disease of native coronary artery without angina pectoris: Secondary | ICD-10-CM | POA: Diagnosis present

## 2022-01-18 DIAGNOSIS — G4733 Obstructive sleep apnea (adult) (pediatric): Secondary | ICD-10-CM | POA: Diagnosis not present

## 2022-01-18 DIAGNOSIS — J439 Emphysema, unspecified: Secondary | ICD-10-CM | POA: Diagnosis not present

## 2022-01-18 DIAGNOSIS — Z8249 Family history of ischemic heart disease and other diseases of the circulatory system: Secondary | ICD-10-CM | POA: Diagnosis not present

## 2022-01-18 DIAGNOSIS — F4310 Post-traumatic stress disorder, unspecified: Secondary | ICD-10-CM | POA: Diagnosis present

## 2022-01-18 DIAGNOSIS — F3181 Bipolar II disorder: Secondary | ICD-10-CM | POA: Diagnosis not present

## 2022-01-18 DIAGNOSIS — R410 Disorientation, unspecified: Secondary | ICD-10-CM | POA: Diagnosis not present

## 2022-01-18 DIAGNOSIS — K760 Fatty (change of) liver, not elsewhere classified: Secondary | ICD-10-CM | POA: Diagnosis present

## 2022-01-18 DIAGNOSIS — R490 Dysphonia: Secondary | ICD-10-CM | POA: Diagnosis not present

## 2022-01-18 DIAGNOSIS — E782 Mixed hyperlipidemia: Secondary | ICD-10-CM | POA: Diagnosis present

## 2022-01-18 DIAGNOSIS — A419 Sepsis, unspecified organism: Secondary | ICD-10-CM

## 2022-01-18 DIAGNOSIS — Z20822 Contact with and (suspected) exposure to covid-19: Secondary | ICD-10-CM | POA: Diagnosis present

## 2022-01-18 DIAGNOSIS — Z981 Arthrodesis status: Secondary | ICD-10-CM

## 2022-01-18 DIAGNOSIS — R531 Weakness: Secondary | ICD-10-CM | POA: Diagnosis not present

## 2022-01-18 DIAGNOSIS — K219 Gastro-esophageal reflux disease without esophagitis: Secondary | ICD-10-CM | POA: Diagnosis not present

## 2022-01-18 DIAGNOSIS — R000 Tachycardia, unspecified: Secondary | ICD-10-CM | POA: Diagnosis not present

## 2022-01-18 DIAGNOSIS — I959 Hypotension, unspecified: Secondary | ICD-10-CM | POA: Diagnosis not present

## 2022-01-18 DIAGNOSIS — Z6841 Body Mass Index (BMI) 40.0 and over, adult: Secondary | ICD-10-CM | POA: Diagnosis not present

## 2022-01-18 DIAGNOSIS — Z8673 Personal history of transient ischemic attack (TIA), and cerebral infarction without residual deficits: Secondary | ICD-10-CM | POA: Diagnosis not present

## 2022-01-18 DIAGNOSIS — E039 Hypothyroidism, unspecified: Secondary | ICD-10-CM | POA: Diagnosis present

## 2022-01-18 DIAGNOSIS — J811 Chronic pulmonary edema: Secondary | ICD-10-CM | POA: Diagnosis not present

## 2022-01-18 DIAGNOSIS — Z7982 Long term (current) use of aspirin: Secondary | ICD-10-CM

## 2022-01-18 DIAGNOSIS — Z794 Long term (current) use of insulin: Secondary | ICD-10-CM

## 2022-01-18 DIAGNOSIS — R509 Fever, unspecified: Principal | ICD-10-CM

## 2022-01-18 DIAGNOSIS — Z96653 Presence of artificial knee joint, bilateral: Secondary | ICD-10-CM | POA: Diagnosis present

## 2022-01-18 DIAGNOSIS — B348 Other viral infections of unspecified site: Secondary | ICD-10-CM | POA: Diagnosis not present

## 2022-01-18 DIAGNOSIS — E1169 Type 2 diabetes mellitus with other specified complication: Secondary | ICD-10-CM | POA: Diagnosis present

## 2022-01-18 DIAGNOSIS — E118 Type 2 diabetes mellitus with unspecified complications: Secondary | ICD-10-CM | POA: Diagnosis present

## 2022-01-18 DIAGNOSIS — I509 Heart failure, unspecified: Secondary | ICD-10-CM | POA: Diagnosis present

## 2022-01-18 DIAGNOSIS — G894 Chronic pain syndrome: Secondary | ICD-10-CM | POA: Diagnosis present

## 2022-01-18 DIAGNOSIS — G9341 Metabolic encephalopathy: Secondary | ICD-10-CM | POA: Diagnosis not present

## 2022-01-18 DIAGNOSIS — Z79899 Other long term (current) drug therapy: Secondary | ICD-10-CM | POA: Diagnosis not present

## 2022-01-18 DIAGNOSIS — F319 Bipolar disorder, unspecified: Secondary | ICD-10-CM | POA: Diagnosis not present

## 2022-01-18 DIAGNOSIS — Z7984 Long term (current) use of oral hypoglycemic drugs: Secondary | ICD-10-CM

## 2022-01-18 DIAGNOSIS — F32A Depression, unspecified: Secondary | ICD-10-CM | POA: Diagnosis not present

## 2022-01-18 DIAGNOSIS — I110 Hypertensive heart disease with heart failure: Secondary | ICD-10-CM | POA: Diagnosis not present

## 2022-01-18 DIAGNOSIS — E1165 Type 2 diabetes mellitus with hyperglycemia: Secondary | ICD-10-CM | POA: Diagnosis present

## 2022-01-18 DIAGNOSIS — Z79890 Hormone replacement therapy (postmenopausal): Secondary | ICD-10-CM

## 2022-01-18 DIAGNOSIS — E871 Hypo-osmolality and hyponatremia: Secondary | ICD-10-CM | POA: Diagnosis present

## 2022-01-18 DIAGNOSIS — R6510 Systemic inflammatory response syndrome (SIRS) of non-infectious origin without acute organ dysfunction: Secondary | ICD-10-CM | POA: Diagnosis present

## 2022-01-18 HISTORY — DX: Hypo-osmolality and hyponatremia: E87.1

## 2022-01-18 HISTORY — DX: Hypotension, unspecified: I95.9

## 2022-01-18 HISTORY — DX: Metabolic encephalopathy: G93.41

## 2022-01-18 HISTORY — DX: Sepsis, unspecified organism: A41.9

## 2022-01-18 LAB — URINALYSIS, ROUTINE W REFLEX MICROSCOPIC
Bacteria, UA: NONE SEEN
Bilirubin Urine: NEGATIVE
Glucose, UA: >=500 mg/dL — AB
Ketones, ur: 20 mg/dL — AB
Nitrite: NEGATIVE
Protein, ur: 30 mg/dL — AB
Specific Gravity, Urine: 1.025 (ref 1.005–1.030)
pH: 5 (ref 5.0–8.0)

## 2022-01-18 LAB — CBC WITH DIFFERENTIAL/PLATELET
Abs Immature Granulocytes: 0.13 10*3/uL — ABNORMAL HIGH (ref 0.00–0.07)
Basophils Absolute: 0.1 10*3/uL (ref 0.0–0.1)
Basophils Relative: 0 %
Eosinophils Absolute: 0.2 10*3/uL (ref 0.0–0.5)
Eosinophils Relative: 1 %
HCT: 47.6 % (ref 39.0–52.0)
Hemoglobin: 16.7 g/dL (ref 13.0–17.0)
Immature Granulocytes: 1 %
Lymphocytes Relative: 11 %
Lymphs Abs: 1.8 10*3/uL (ref 0.7–4.0)
MCH: 32.2 pg (ref 26.0–34.0)
MCHC: 35.1 g/dL (ref 30.0–36.0)
MCV: 91.7 fL (ref 80.0–100.0)
Monocytes Absolute: 2 10*3/uL — ABNORMAL HIGH (ref 0.1–1.0)
Monocytes Relative: 12 %
Neutro Abs: 12.5 10*3/uL — ABNORMAL HIGH (ref 1.7–7.7)
Neutrophils Relative %: 75 %
Platelets: 178 10*3/uL (ref 150–400)
RBC: 5.19 MIL/uL (ref 4.22–5.81)
RDW: 13.9 % (ref 11.5–15.5)
WBC: 16.7 10*3/uL — ABNORMAL HIGH (ref 4.0–10.5)
nRBC: 0 % (ref 0.0–0.2)

## 2022-01-18 LAB — VALPROIC ACID LEVEL: Valproic Acid Lvl: 74 ug/mL (ref 50.0–100.0)

## 2022-01-18 LAB — RESPIRATORY PANEL BY PCR

## 2022-01-18 LAB — CBG MONITORING, ED
Glucose-Capillary: 330 mg/dL — ABNORMAL HIGH (ref 70–99)
Glucose-Capillary: 394 mg/dL — ABNORMAL HIGH (ref 70–99)
Glucose-Capillary: 326 mg/dL — ABNORMAL HIGH (ref 70–99)
Glucose-Capillary: 336 mg/dL — ABNORMAL HIGH (ref 70–99)

## 2022-01-18 LAB — TSH: TSH: 3.362 u[IU]/mL (ref 0.350–4.500)

## 2022-01-18 LAB — COMPREHENSIVE METABOLIC PANEL
ALT: 18 U/L (ref 0–44)
AST: 14 U/L — ABNORMAL LOW (ref 15–41)
Albumin: 3.2 g/dL — ABNORMAL LOW (ref 3.5–5.0)
Alkaline Phosphatase: 84 U/L (ref 38–126)
Anion gap: 12 (ref 5–15)
BUN: 12 mg/dL (ref 8–23)
CO2: 23 mmol/L (ref 22–32)
Calcium: 8.7 mg/dL — ABNORMAL LOW (ref 8.9–10.3)
Chloride: 92 mmol/L — ABNORMAL LOW (ref 98–111)
Creatinine, Ser: 1.23 mg/dL (ref 0.61–1.24)
GFR, Estimated: >60 mL/min (ref >60)
Glucose, Bld: 316 mg/dL — ABNORMAL HIGH (ref 70–99)
Potassium: 4.2 mmol/L (ref 3.5–5.1)
Sodium: 127 mmol/L — ABNORMAL LOW (ref 135–145)
Total Bilirubin: 0.8 mg/dL (ref 0.3–1.2)
Total Protein: 6.7 g/dL (ref 6.5–8.1)

## 2022-01-18 LAB — RAPID URINE DRUG SCREEN, HOSP PERFORMED
Amphetamines: NOT DETECTED
Barbiturates: NOT DETECTED
Benzodiazepines: POSITIVE — AB
Cocaine: NOT DETECTED
Opiates: NOT DETECTED
Tetrahydrocannabinol: NOT DETECTED

## 2022-01-18 LAB — I-STAT VENOUS BLOOD GAS, ED
Acid-base deficit: 1 mmol/L (ref 0.0–2.0)
Bicarbonate: 23 mmol/L (ref 20.0–28.0)
Calcium, Ion: 1.05 mmol/L — ABNORMAL LOW (ref 1.15–1.40)
HCT: 48 % (ref 39.0–52.0)
Hemoglobin: 16.3 g/dL (ref 13.0–17.0)
O2 Saturation: 84 %
Potassium: 4.4 mmol/L (ref 3.5–5.1)
Sodium: 127 mmol/L — ABNORMAL LOW (ref 135–145)
TCO2: 24 mmol/L (ref 22–32)
pCO2, Ven: 37.5 mmHg — ABNORMAL LOW (ref 44–60)
pH, Ven: 7.396 (ref 7.25–7.43)
pO2, Ven: 49 mmHg — ABNORMAL HIGH (ref 32–45)

## 2022-01-18 LAB — SARS CORONAVIRUS 2 BY RT PCR: SARS Coronavirus 2 by RT PCR: NEGATIVE

## 2022-01-18 LAB — HEMOGLOBIN A1C
Hgb A1c MFr Bld: 9.2 % — ABNORMAL HIGH (ref 4.8–5.6)
Mean Plasma Glucose: 217.34 mg/dL

## 2022-01-18 LAB — LACTIC ACID, PLASMA
Lactic Acid, Venous: 2.3 mmol/L (ref 0.5–1.9)
Lactic Acid, Venous: 3.2 mmol/L (ref 0.5–1.9)

## 2022-01-18 LAB — BLOOD GAS, VENOUS
Acid-Base Excess: 2 mmol/L (ref 0.0–2.0)
Bicarbonate: 25.6 mmol/L (ref 20.0–28.0)
O2 Saturation: 80.1 %
Patient temperature: 37.8
pCO2, Ven: 37 mmHg — ABNORMAL LOW (ref 44–60)
pH, Ven: 7.45 — ABNORMAL HIGH (ref 7.25–7.43)
pO2, Ven: 51 mmHg — ABNORMAL HIGH (ref 32–45)

## 2022-01-18 LAB — BRAIN NATRIURETIC PEPTIDE: B Natriuretic Peptide: 109.6 pg/mL — ABNORMAL HIGH (ref 0.0–100.0)

## 2022-01-18 LAB — SODIUM, URINE, RANDOM: Sodium, Ur: 70 mmol/L

## 2022-01-18 LAB — GLUCOSE, CAPILLARY
Glucose-Capillary: 307 mg/dL — ABNORMAL HIGH (ref 70–99)
Glucose-Capillary: 317 mg/dL — ABNORMAL HIGH (ref 70–99)

## 2022-01-18 LAB — AMMONIA: Ammonia: 20 umol/L (ref 9–35)

## 2022-01-18 LAB — ETHANOL: Alcohol, Ethyl (B): <10 mg/dL (ref <10)

## 2022-01-18 LAB — GROUP A STREP BY PCR: Group A Strep by PCR: NOT DETECTED

## 2022-01-18 LAB — OSMOLALITY, URINE: Osmolality, Ur: 460 mOsm/kg (ref 300–900)

## 2022-01-18 MED ORDER — LEVOTHYROXINE SODIUM 75 MCG PO TABS
175.0000 ug | ORAL_TABLET | Freq: Every day | ORAL | Status: DC
  Administered 2022-01-18 – 2022-01-20 (×3): 175 ug via ORAL
  Filled 2022-01-18 (×3): qty 1

## 2022-01-18 MED ORDER — SODIUM CHLORIDE 0.9 % IV SOLN: 1.0000 g | INTRAVENOUS | Status: DC

## 2022-01-18 MED ORDER — MAGNESIUM OXIDE -MG SUPPLEMENT 400 (240 MG) MG PO TABS
400.0000 mg | ORAL_TABLET | Freq: Every day | ORAL | Status: DC
  Administered 2022-01-18 – 2022-01-19 (×2): 400 mg via ORAL
  Filled 2022-01-18 (×2): qty 1

## 2022-01-18 MED ORDER — ENOXAPARIN SODIUM 80 MG/0.8ML IJ SOSY
80.0000 mg | PREFILLED_SYRINGE | INTRAMUSCULAR | Status: DC
  Administered 2022-01-18 – 2022-01-19 (×2): 80 mg via SUBCUTANEOUS
  Filled 2022-01-18 (×2): qty 0.8

## 2022-01-18 MED ORDER — LORAZEPAM 1 MG PO TABS
1.0000 mg | ORAL_TABLET | ORAL | Status: DC | PRN
Start: 2022-01-18 — End: 2022-01-20

## 2022-01-18 MED ORDER — SODIUM CHLORIDE 0.9 % IV SOLN
100.0000 mg | Freq: Two times a day (BID) | INTRAVENOUS | Status: DC
  Administered 2022-01-18 – 2022-01-19 (×3): 100 mg via INTRAVENOUS
  Filled 2022-01-18 (×5): qty 100

## 2022-01-18 MED ORDER — INSULIN GLARGINE-YFGN 100 UNIT/ML ~~LOC~~ SOLN
16.0000 [IU] | Freq: Every day | SUBCUTANEOUS | Status: DC
  Administered 2022-01-18 – 2022-01-19 (×2): 16 [IU] via SUBCUTANEOUS
  Filled 2022-01-18 (×2): qty 0.16

## 2022-01-18 MED ORDER — ALBUTEROL SULFATE (2.5 MG/3ML) 0.083% IN NEBU: 2.5000 mg | INHALATION_SOLUTION | RESPIRATORY_TRACT | Status: DC | PRN

## 2022-01-18 MED ORDER — QUETIAPINE FUMARATE 100 MG PO TABS
800.0000 mg | ORAL_TABLET | Freq: Every day | ORAL | Status: DC
  Administered 2022-01-18 – 2022-01-19 (×2): 800 mg via ORAL
  Filled 2022-01-18 (×2): qty 8

## 2022-01-18 MED ORDER — MAGNESIUM OXIDE 400 MG PO TABS
400.0000 mg | ORAL_TABLET | Freq: Every day | ORAL | Status: DC
  Filled 2022-01-18 (×2): qty 1

## 2022-01-18 MED ORDER — GUAIFENESIN ER 600 MG PO TB12
600.0000 mg | ORAL_TABLET | Freq: Two times a day (BID) | ORAL | Status: DC
  Administered 2022-01-18 – 2022-01-19 (×4): 600 mg via ORAL
  Filled 2022-01-18 (×4): qty 1

## 2022-01-18 MED ORDER — DIVALPROEX SODIUM 500 MG PO DR TAB
1000.0000 mg | DELAYED_RELEASE_TABLET | Freq: Two times a day (BID) | ORAL | Status: DC
  Administered 2022-01-18 – 2022-01-20 (×4): 1000 mg via ORAL
  Filled 2022-01-18 (×4): qty 2

## 2022-01-18 MED ORDER — ACETAMINOPHEN 650 MG RE SUPP: 650.0000 mg | Freq: Four times a day (QID) | RECTAL | Status: DC | PRN

## 2022-01-18 MED ORDER — FAMOTIDINE 20 MG PO TABS
40.0000 mg | ORAL_TABLET | Freq: Every day | ORAL | Status: DC
  Administered 2022-01-18 – 2022-01-19 (×2): 40 mg via ORAL
  Filled 2022-01-18 (×2): qty 2

## 2022-01-18 MED ORDER — SODIUM CHLORIDE 0.9 % IV SOLN
2.0000 g | INTRAVENOUS | Status: DC
  Administered 2022-01-19 – 2022-01-20 (×2): 2 g via INTRAVENOUS
  Filled 2022-01-18 (×2): qty 20

## 2022-01-18 MED ORDER — FOLIC ACID 1 MG PO TABS
1.0000 mg | ORAL_TABLET | Freq: Every day | ORAL | Status: DC
  Administered 2022-01-18 – 2022-01-19 (×2): 1 mg via ORAL
  Filled 2022-01-18 (×2): qty 1

## 2022-01-18 MED ORDER — CEFTRIAXONE SODIUM 1 G IJ SOLR
1.0000 g | Freq: Once | INTRAMUSCULAR | Status: AC
Start: 2022-01-18 — End: 2022-01-18
  Administered 2022-01-18: 1 g via INTRAVENOUS
  Filled 2022-01-18: qty 10

## 2022-01-18 MED ORDER — INSULIN ASPART 100 UNIT/ML IJ SOLN
0.0000 [IU] | Freq: Every day | INTRAMUSCULAR | Status: DC
  Administered 2022-01-18: 4 [IU] via SUBCUTANEOUS

## 2022-01-18 MED ORDER — INSULIN GLARGINE-YFGN 100 UNIT/ML ~~LOC~~ SOLN
10.0000 [IU] | Freq: Every day | SUBCUTANEOUS | Status: DC
Start: 2022-01-18 — End: 2022-01-18
  Filled 2022-01-18: qty 0.1

## 2022-01-18 MED ORDER — MENTHOL 3 MG MT LOZG: 1.0000 | LOZENGE | OROMUCOSAL | Status: DC | PRN

## 2022-01-18 MED ORDER — ASPIRIN 81 MG PO TBEC
81.0000 mg | DELAYED_RELEASE_TABLET | Freq: Every day | ORAL | Status: DC
  Administered 2022-01-18 – 2022-01-19 (×2): 81 mg via ORAL
  Filled 2022-01-18 (×2): qty 1

## 2022-01-18 MED ORDER — TEMAZEPAM 15 MG PO CAPS
15.0000 mg | ORAL_CAPSULE | Freq: Every day | ORAL | Status: DC
  Administered 2022-01-18 – 2022-01-19 (×2): 15 mg via ORAL
  Filled 2022-01-18 (×2): qty 1

## 2022-01-18 MED ORDER — SODIUM CHLORIDE 0.9 % IV SOLN
500.0000 mg | INTRAVENOUS | Status: DC
  Administered 2022-01-18: 500 mg via INTRAVENOUS
  Filled 2022-01-18: qty 5

## 2022-01-18 MED ORDER — INSULIN GLARGINE-YFGN 100 UNIT/ML ~~LOC~~ SOLN
5.0000 [IU] | Freq: Every day | SUBCUTANEOUS | Status: DC
Start: 2022-01-18 — End: 2022-01-18
  Filled 2022-01-18: qty 0.05

## 2022-01-18 MED ORDER — GABAPENTIN 400 MG PO CAPS
400.0000 mg | ORAL_CAPSULE | Freq: Three times a day (TID) | ORAL | Status: DC
  Administered 2022-01-18 – 2022-01-19 (×5): 400 mg via ORAL
  Filled 2022-01-18 (×5): qty 1

## 2022-01-18 MED ORDER — PAROXETINE HCL 20 MG PO TABS
20.0000 mg | ORAL_TABLET | Freq: Every day | ORAL | Status: DC
  Administered 2022-01-18 – 2022-01-19 (×2): 20 mg via ORAL
  Filled 2022-01-18 (×2): qty 1

## 2022-01-18 MED ORDER — INSULIN ASPART 100 UNIT/ML IJ SOLN
0.0000 [IU] | Freq: Three times a day (TID) | INTRAMUSCULAR | Status: DC
  Administered 2022-01-18 (×2): 11 [IU] via SUBCUTANEOUS

## 2022-01-18 MED ORDER — SODIUM CHLORIDE 0.9 % IV SOLN
1.0000 g | Freq: Once | INTRAVENOUS | Status: AC
  Administered 2022-01-18: 1 g via INTRAVENOUS
  Filled 2022-01-18: qty 10

## 2022-01-18 MED ORDER — ACETAMINOPHEN 325 MG PO TABS: 650.0000 mg | ORAL_TABLET | Freq: Four times a day (QID) | ORAL | Status: DC | PRN

## 2022-01-18 MED ORDER — SODIUM CHLORIDE 0.9 % IV SOLN: INTRAVENOUS | Status: DC

## 2022-01-18 MED ORDER — OLANZAPINE 5 MG PO TBDP: 10.0000 mg | ORAL_TABLET | Freq: Three times a day (TID) | ORAL | Status: DC | PRN

## 2022-01-18 MED ORDER — ZIPRASIDONE MESYLATE 20 MG IM SOLR: 20.0000 mg | INTRAMUSCULAR | Status: DC | PRN

## 2022-01-18 MED ORDER — ACETAMINOPHEN 500 MG PO TABS
1000.0000 mg | ORAL_TABLET | Freq: Once | ORAL | Status: AC
  Administered 2022-01-18: 1000 mg via ORAL
  Filled 2022-01-18: qty 2

## 2022-01-18 NOTE — Progress Notes (Signed)
Adjusted diabetic regimen per recommendations by diabetic educator.  Patient received a total 22 units of NovoLog insulin per sliding scale.  Started Semglee 16 units with NovoLog 0 to 5 units nightly.  Appreciate diabetic consultative services.

## 2022-01-18 NOTE — H&P (Addendum)
History and Physical    Patient: Rodney Leach ACZ:[id number] DOB: [birth year] DOA: 01/18/2022 DOS: the patient was seen and examined on 01/18/2022 PCP: Leonides Sake, MD  Patient coming from: Home via EMS  Chief Complaint:  Chief Complaint  Patient presents with   Weakness   Sore Throat   HPI: Rodney Leach is a 61 y.o. male with medical history significant of hypertension, CAD, CHF, CVA, hypothyroidism, bipolar disorder,  history of MRSA, GERD, tobacco abuse, and prior alcohol abuse who presents with complaints of not feeling well over the last few days.  He reports that he has had a nonproductive cough that is worse than his normal along with urinary frequency.  Associated symptoms include subjective fever/chills, hoarse voice, decreased sleep, and confusion.  Denied having any wheezing, nausea, vomiting, or diarrhea.  His wife adds that he had been not sleeping well over the last 2 weeks up intermittently at night having to urinate.  He had seen his psychiatrist yesterday and wife makes note that no changes were made.  He was supposed to start on Vraylar, but they had a difficult time getting prior authorization and his wife was just able to pick up the medication today.  He previously had self medicated with alcohol, but has not drank in over a month.  Upon admission to the emergency department patient was noted to be febrile up to 101.2 F with heart rate 78-1 03, respirations 18-27, blood pressure 79/44-121/58, and O2 saturations currently maintained on room air.  COVID screening was negative.  Labs significant for WBC 16.7, sodium 127, chloride 92, glucose 316, anion gap 12, and lactic acid 2.3.  Chest x-ray noted cardiomegaly with pulmonary vascular congestion.  Urinalysis noted trace leukocytes with no bacteria seen, but 21-50 WBCs.  CT scan of the head did not note any acute abnormality.  Patient has been given acetaminophen 1000 mg p.o.,  Rocephin, and azithromycin.  Review of  Systems: As mentioned in the history of present illness. All other systems reviewed and are negative. Past Medical History:  Diagnosis Date   Adenomatous colon polyp    ADHD, adult residual type    Anxiety 09/03/2012   Adequate for discharge   Aortic atherosclerosis ([name])    Arthritis    Asthma    Atypical chest pain    Bipolar affective ([place])    Bipolar depression ([place])    Bipolar II disorder ([name])    CAD (coronary artery disease)    Carpal tunnel syndrome of right wrist 10/21/2017   Cervical disc displacement 03/24/2017   Formatting of this note might be different from the original. CERVICAL 5-6, CERVICAL 6-7   Chronic pain syndrome    Complication of anesthesia    has woken up during surgery before    Depression 09/03/2012   Adequate for discharge   Diabetes mellitus without complication ([place])    Emphysema/COPD ([place])    Essential hypertension 10/09/2021   Blood pressure is picking up.  Hypotensive on arrival.  Resume amlodipine today.  May not need to multiple antihypertensives.   GERD (gastroesophageal reflux disease)    Hepatic steatosis    Hypomagnesemia 10/09/2021   Replace aggressively today.  Recheck levels tomorrow including phosphorus and potassium.   Hypothyroidism    Impotence, organic    Irregular heart beat    Lumbar radiculopathy, chronic    Mixed hyperlipidemia    MRSA (methicillin resistant staph aureus) culture positive 08/08/2016   Formatting of this note might be different from  the original. UPDATE : MRSA RE-SWAB FOR NEXT SURGERY ALSO +MRSA ON 03-24-17  + MRSA NASAL SWAB ON 08-08-16   Nodule of lower lobe of right lung    Obstructive sleep apnea 10/09/2021   Start using CPAP at night.  Wean off oxygen.   Pneumonia    Postlaminectomy syndrome of lumbosacral region 08/08/2016   PTSD (post-traumatic stress disorder)    Recurrent major depressive disorder ([place]) 01/30/2018   Sciatica 08/08/2016   Shock ([place]) 10/07/2021   Slurred speech 01/14/2013   Spinal stenosis  08/08/2016   Stroke ([place])    "light" stroke   Type 2 diabetes mellitus with complication, without long-term current use of insulin ([place]) 10/09/2021   On metformin at home.  Fairly controlled as per patient.  Holding metformin.  Currently on sliding scale insulin.  Resume metformin on discharge.   Type 2 diabetes mellitus with diabetic neuropathy ([name])    Type 2 diabetes mellitus with hyperlipidemia [facility])    Past Surgical History:  Procedure Laterality Date   BACK SURGERY     fusion L5/S1   COLONOSCOPY WITH PROPOFOL N/A 09/03/2017   Procedure: COLONOSCOPY WITH PROPOFOL;  Surgeon: Laurence Spates, MD;  Location: [facility] ENDOSCOPY;  Service: Endoscopy;  Laterality: N/A;   HAND SURGERY     R & L    HERNIA REPAIR     umbilical   LEFT HEART CATH AND CORONARY ANGIOGRAPHY N/A 01/02/2022   Procedure: LEFT HEART CATH AND CORONARY ANGIOGRAPHY;  Surgeon: Martinique, Peter M, MD;  Location: [facility] CV LAB;  Service: Cardiovascular;  Laterality: N/A;   MULTIPLE TOOTH EXTRACTIONS     NOSE SURGERY     fracture repair   TOTAL KNEE ARTHROPLASTY  2012   Right   TOTAL KNEE ARTHROPLASTY  04/07/2012   Procedure: TOTAL KNEE ARTHROPLASTY;  Surgeon: Ninetta Lights, MD;  Location: [place];  Service: Orthopedics;  Laterality: Left;  left total knee arthroplasty   Social History:  reports that he has been smoking cigarettes. He has been smoking an average of .75 packs per day. He has quit using smokeless tobacco.  His smokeless tobacco use included chew. He reports current alcohol use. He reports that he does not use drugs.  Allergies  Allergen Reactions   Carbamazepine Hives   Adhesive [Tape] Rash   Sulfa Antibiotics Rash    Family History  Problem Relation Age of Onset   Hypertension Mother    Diabetes Mother    Hypertension Father     Prior to Admission medications   Medication Sig Start Date End Date Taking? Authorizing Provider  albuterol (VENTOLIN HFA) 108 (90 Base) MCG/ACT inhaler Inhale 2 puffs into  the lungs every 6 (six) hours as needed for shortness of breath. 12/26/21  Yes Clapacs, Madie Reno, MD  amLODipine (NORVASC) 10 MG tablet Take 1 tablet (10 mg total) by mouth daily. 12/27/21  Yes Clapacs, Madie Reno, MD  aspirin EC 81 MG tablet Take 81 mg by mouth daily. Swallow whole.   Yes [provider]  divalproex (DEPAKOTE) 500 MG DR tablet Take 2 tablets (1,000 mg total) by mouth 2 (two) times daily. 12/26/21  Yes Clapacs, Madie Reno, MD  famotidine (PEPCID) 40 MG tablet Take 1 tablet (40 mg total) by mouth daily. 12/27/21  Yes Clapacs, Madie Reno, MD  folic acid (FOLVITE) 1 MG tablet Take 1 tablet (1 mg total) by mouth at bedtime. 12/26/21  Yes Clapacs, Madie Reno, MD  gabapentin (NEURONTIN) 400 MG capsule Take 1 capsule (  400 mg total) by mouth 3 (three) times daily. 12/26/21  Yes Clapacs, Madie Reno, MD  levothyroxine (SYNTHROID) 175 MCG tablet Take 1 tablet (175 mcg total) by mouth daily at 6 (six) AM. 12/27/21  Yes Clapacs, Madie Reno, MD  losartan (COZAAR) 100 MG tablet Take 1 tablet (100 mg total) by mouth daily. 12/26/21  Yes Clapacs, Madie Reno, MD  magnesium oxide (MAG-OX) 400 MG tablet Take 400 mg by mouth at bedtime. 12/26/21  Yes [provider]  metFORMIN (GLUCOPHAGE) 1000 MG tablet Take 1 tablet (1,000 mg total) by mouth 2 (two) times daily. 01/05/22  Yes Martinique, Peter M, MD  metoprolol succinate (TOPROL-XL) 25 MG 24 hr tablet Take 1 tablet (25 mg total) by mouth daily. 12/27/21  Yes Clapacs, Madie Reno, MD  nitroGLYCERIN (NITROSTAT) 0.4 MG SL tablet Place 1 tablet (0.4 mg total) under the tongue every 5 (five) minutes as needed for chest pain. 12/30/21  Yes Revankar, Reita Cliche, MD  NON FORMULARY CPAP at bedtime   Yes [provider]  PARoxetine (PAXIL) 20 MG tablet Take 1 tablet (20 mg total) by mouth daily. 12/27/21  Yes Clapacs, Madie Reno, MD  QUEtiapine (SEROQUEL) 400 MG tablet Take 2 tablets (800 mg total) by mouth at bedtime. 12/26/21  Yes Clapacs, Madie Reno, MD  temazepam (RESTORIL) 15 MG capsule Take 1  capsule (15 mg total) by mouth at bedtime. 12/26/21  Yes Clapacs, Madie Reno, MD  aspirin 81 MG chewable tablet Chew 1 tablet (81 mg total) by mouth daily. Patient not taking: Reported on 01/18/2022 12/27/21   Clapacs, Madie Reno, MD  cariprazine 4.5 MG CAPS Take 1 capsule (4.5 mg total) by mouth daily. Patient not taking: Reported on 01/18/2022 12/27/21   Clapacs, Madie Reno, MD  rosuvastatin (CRESTOR) 5 MG tablet Take 1 tablet (5 mg total) by mouth daily. Patient not taking: Reported on 01/18/2022 12/27/21   Clapacs, Madie Reno, MD  VRAYLAR 1.5 MG capsule Take 1.5 mg by mouth daily. 01/17/22   [provider]    Physical Exam: Vitals:   01/18/22 0615 01/18/22 0630 01/18/22 0645 01/18/22 0700  BP:  (!) 109/56 (!) 79/44 103/65  Pulse: 86 78 80 82  Resp: (!) '27 19 18 17  '$ Temp:      TempSrc:      SpO2: 94% 94% 94% 95%  Weight:      Height:        Constitutional: Obese elderly male who appears ill and is lethargic but easily arousable. Eyes: PERRL, lids and conjunctivae normal ENMT: Mucous membranes are dry. Neck: normal, supple,    Respiratory: Normal respiratory effort with clear lungs.  O2 saturations currently maintained on 2 L of nasal cannula oxygen.  Hoarse voice.   Cardiovascular: Regular rate and rhythm, no murmurs / rubs / gallops.  Trace lower extremity edema.  2+ pedal pulses.   Abdomen: no tenderness, no masses palpated. No hepatosplenomegaly. Bowel sounds positive.  Musculoskeletal: no clubbing / cyanosis. No joint deformity upper and lower extremities. Good ROM, no contractures. Normal muscle tone.  Skin: no rashes, lesions, ulcers. No induration Neurologic: CN 2-12 grossly intact. Sensation intact, DTR normal. Strength 5/5 in all 4.  Psychiatric: Normal judgment and insight.  Lethargic, but oriented x3 at this time  Data Reviewed:  EKG reveals sinus tachycardia at 103 bpm with QTc 493 with RBBB  Assessment and Plan: Sepsis, unknown source Patient presented with complaints of  malaise, nonproductive cough, sore throat, and urinary frequency.  He was  noted to be febrile up to 101.2 F with tachycardia and tachypnea.  WBC was elevated at 16.7 and initial lactic acid 2.3.  COVID-19 screening was negative.  Urinalysis noted trace leukocytes with 21-50 WBCs, but did not note signs of bacteria.  Chest x-ray noted cardiomegaly with central pulmonary vascular congestion.  Patient has been started on empiric antibiotics of Rocephin and azithromycin. -Admit to a telemetry bed -Follow-up blood and urine cultures -Check respiratory virus panel given patient's symptoms of hoarseness and sepsis physiology noted on initial admission. -Continue empiric antibiotics of Rocephin and Doxycyline  -Trend lactic acid level -Tylenol po as need for fever  Transient hypotension Acute. Blood pressures noted to be transient as low as 79/44.  Home blood pressure regimen includes amlodipine 10 mg daily, losartan 100 mg daily, and metoprolol succinate 25 mg daily -Hold home blood pressure medications at this time.  Reevaluate and determine when medically appropriate to resume. -Normal saline IV fluids at 100 mL/h -Adjust IV fluids as needed to maintain maps greater than 65  Acute metabolic encephalopathy Patient was noted to be altered.  UDS positive for benzodiazepines which she is on for sleep..  Valproic acid and ammonia levels within normal limits.  Gabapentin had been decreased recently from previous dosage.  Possibly secondary to infection of unclear source at this time. -Neurochecks -Check venous blood gas -Pharmacy consulted to evaluate for possible drug drug interactions  Bipolar depression Home medication regimen includes Paxil, Seroquel, and Depakote. Vraylar had just recently been picked up, but medication not started yet. -Continue home regimen and start Vraylar in outpatient setting  Hyponatremia Acute on chronic.  Sodium 127 on admission.  Possibly -Check urine sodium and urine  osmolarity  Diabetes mellitus type 2, uncontrolled On admission glucose elevated up to 316. Home medication regimen includes metformin 1000 mg twice daily. Wife reported last hemoglobin A1c previously was 6.7. -Hypoglycemic protocols -Hold metformin -Check Hemoglobin A1c 9.2  -semglee 5 units -CBGs before every meal with moderate SSI -Diabetic education consulted  Pulmonary vascular congestion Last EF noted to be 60-65% with indeterminate diastolic parameters 0/25/4270.  Patient noted to have some trace lower extremity edema, but does not appear grossly fluid overloaded. -Check BNP  CAD Patient just underwent cardiac catheterization on 5/25 which noted mild nonobstructive coronary artery disease with normal LV function. -Continue aspirin  Hypothyroidism -Check TSH -Continue levothyroxine    GERD -Continue Pepcid  Tobacco abuse Patient continues to smoke a pack of cigarettes per day on average, but declines need of nicotine patch. -Continue to counsel need to/from tobacco use  OSA on CPAP -Continue CPAP at night  Morbid obesity BMI 43.12 kg/m2   Advance Care Planning:   Code Status: Full Code   Consults: none  Family Communication: Wife updated over the phone  Severity of Illness:  The appropriate patient status for this patient is INPATIENT. Inpatient status is judged to be reasonable and necessary in order to provide the required intensity of service to ensure the patient's safety. The patient's presenting symptoms, physical exam findings, and initial radiographic and laboratory data in the context of their chronic comorbidities is felt to place them at high risk for further clinical deterioration. Furthermore, it is not anticipated that the patient will be medically stable for discharge from the hospital within 2 midnights of admission.   * I certify that at the point of admission it is my clinical judgment that the patient will require inpatient hospital care spanning  beyond 2 midnights from the  point of admission due to high intensity of service, high risk for further deterioration and high frequency of surveillance required.* Author: Norval Morton, MD 01/18/2022 7:26 AM  For on call review [email or web address].

## 2022-01-18 NOTE — ED Provider Notes (Signed)
[facility] EMERGENCY DEPARTMENT Provider Note   CSN: [id number] Arrival date & time: 01/18/22  0058     History  Chief Complaint  Patient presents with   Weakness   Sore Throat    Rodney Leach is a 61 y.o. male.  The history is provided by the patient and medical records.  Weakness Sore Throat  61 year old male with history of alcohol abuse, bipolar disorder, coronary artery disease, depression, diabetes, GERD, hypothyroidism, obesity, prior stroke, presenting to the ED with wife for altered mental status and generalized weakness.  Wife reports when she came home from work today he was complaining of not feeling well, seem to worsen throughout the evening and "was not acting like himself".  He had had a cough but denies fever, chills, sweats, nausea, vomiting. He did eat and drink well today.  He denies any specific pain.  He does take medication for sleep and had already taken it tonight.  Denies any known sick contacts.  Home Medications Prior to Admission medications   Medication Sig Start Date End Date Taking? Authorizing Provider  albuterol (VENTOLIN HFA) 108 (90 Base) MCG/ACT inhaler Inhale 2 puffs into the lungs every 6 (six) hours as needed for shortness of breath. 12/26/21   Clapacs, Madie Reno, MD  amLODipine (NORVASC) 10 MG tablet Take 1 tablet (10 mg total) by mouth daily. 12/27/21   Clapacs, Madie Reno, MD  aspirin 81 MG chewable tablet Chew 1 tablet (81 mg total) by mouth daily. 12/27/21   Clapacs, Madie Reno, MD  cariprazine 4.5 MG CAPS Take 1 capsule (4.5 mg total) by mouth daily. 12/27/21   Clapacs, Madie Reno, MD  divalproex (DEPAKOTE) 500 MG DR tablet Take 2 tablets (1,000 mg total) by mouth 2 (two) times daily. 12/26/21   Clapacs, Madie Reno, MD  famotidine (PEPCID) 40 MG tablet Take 1 tablet (40 mg total) by mouth daily. 12/27/21   Clapacs, Madie Reno, MD  folic acid (FOLVITE) 1 MG tablet Take 1 tablet (1 mg total) by mouth at bedtime. 12/26/21   Clapacs, Madie Reno, MD   gabapentin (NEURONTIN) 400 MG capsule Take 1 capsule (400 mg total) by mouth 3 (three) times daily. 12/26/21   Clapacs, Madie Reno, MD  levothyroxine (SYNTHROID) 175 MCG tablet Take 1 tablet (175 mcg total) by mouth daily at 6 (six) AM. 12/27/21   Clapacs, Madie Reno, MD  losartan (COZAAR) 100 MG tablet Take 1 tablet (100 mg total) by mouth daily. 12/26/21   Clapacs, Madie Reno, MD  magnesium oxide (MAG-OX) 400 MG tablet Take 1 tablet by mouth at bedtime. 12/26/21   [provider]  metFORMIN (GLUCOPHAGE) 1000 MG tablet Take 1 tablet (1,000 mg total) by mouth 2 (two) times daily. 01/05/22   Martinique, Peter M, MD  metoprolol succinate (TOPROL-XL) 25 MG 24 hr tablet Take 1 tablet (25 mg total) by mouth daily. 12/27/21   Clapacs, Madie Reno, MD  nitroGLYCERIN (NITROSTAT) 0.4 MG SL tablet Place 1 tablet (0.4 mg total) under the tongue every 5 (five) minutes as needed for chest pain. 12/30/21   Revankar, Reita Cliche, MD  PARoxetine (PAXIL) 20 MG tablet Take 1 tablet (20 mg total) by mouth daily. 12/27/21   Clapacs, Madie Reno, MD  QUEtiapine (SEROQUEL) 400 MG tablet Take 2 tablets (800 mg total) by mouth at bedtime. 12/26/21   Clapacs, Madie Reno, MD  rosuvastatin (CRESTOR) 5 MG tablet Take 1 tablet (5 mg total) by mouth daily. 12/27/21   Clapacs, Madie Reno,  MD  temazepam (RESTORIL) 15 MG capsule Take 1 capsule (15 mg total) by mouth at bedtime. 12/26/21   Clapacs, Madie Reno, MD  thiamine 100 MG tablet Take 1 tablet (100 mg total) by mouth daily. 12/26/21   Clapacs, Madie Reno, MD      Allergies    Carbamazepine, Adhesive [tape], and Sulfa antibiotics    Review of Systems   Review of Systems  Neurological:  Positive for weakness.  All other systems reviewed and are negative.   Physical Exam Updated Vital Signs BP (!) 121/58 (BP Location: Right Arm)   Pulse (!) 103   Temp 99.9 F (37.7 C) (Oral)   Resp (!) 23   Ht '6\' 3"'$  (1.905 m)   Wt (!) 156.5 kg   SpO2 95%   BMI 43.12 kg/m   Physical Exam Vitals and nursing note reviewed.   Constitutional:      General: He is sleeping.     Appearance: He is well-developed. He is obese.  HENT:     Head: Normocephalic and atraumatic.     Mouth/Throat:     Comments: Voice is hoarse (baseline per wife) Eyes:     Conjunctiva/sclera: Conjunctivae normal.     Pupils: Pupils are equal, round, and reactive to light.  Neck:     Comments: FROM neck, no rigidity Cardiovascular:     Rate and Rhythm: Normal rate and regular rhythm.     Heart sounds: Normal heart sounds.  Pulmonary:     Effort: Pulmonary effort is normal.     Breath sounds: Normal breath sounds.  Abdominal:     General: Bowel sounds are normal.     Palpations: Abdomen is soft.  Musculoskeletal:        General: Normal range of motion.     Cervical back: Normal range of motion.  Skin:    General: Skin is warm and dry.  Neurological:     Mental Status: He is oriented to person, place, and time and easily aroused.     Comments: Falling asleep quickly during exam but awakens quickly, able to answer questions and follow commands     ED Results / Procedures / Treatments   Labs (all labs ordered are listed, but only abnormal results are displayed) Labs Reviewed  LACTIC ACID, PLASMA - Abnormal; Notable for the following components:      Result Value   Lactic Acid, Venous 2.3 (*)    All other components within normal limits  CBC WITH DIFFERENTIAL/PLATELET - Abnormal; Notable for the following components:   WBC 16.7 (*)    Neutro Abs 12.5 (*)    Monocytes Absolute 2.0 (*)    Abs Immature Granulocytes 0.13 (*)    All other components within normal limits  COMPREHENSIVE METABOLIC PANEL - Abnormal; Notable for the following components:   Sodium 127 (*)    Chloride 92 (*)    Glucose, Bld 316 (*)    Calcium 8.7 (*)    Albumin 3.2 (*)    AST 14 (*)    All other components within normal limits  URINALYSIS, ROUTINE W REFLEX MICROSCOPIC - Abnormal; Notable for the following components:   Glucose, UA >=500 (*)     Hgb urine dipstick SMALL (*)    Ketones, ur 20 (*)    Protein, ur 30 (*)    Leukocytes,Ua TRACE (*)    All other components within normal limits  RAPID URINE DRUG SCREEN, HOSP PERFORMED - Abnormal; Notable for the following components:  Benzodiazepines POSITIVE (*)    All other components within normal limits  CBG MONITORING, ED - Abnormal; Notable for the following components:   Glucose-Capillary 330 (*)    All other components within normal limits  SARS CORONAVIRUS 2 BY RT PCR  CULTURE, BLOOD (ROUTINE X 2)  CULTURE, BLOOD (ROUTINE X 2)  URINE CULTURE  VALPROIC ACID LEVEL  AMMONIA  ETHANOL  LACTIC ACID, PLASMA    EKG None  Radiology DG Chest Port 1 View  Result Date: 01/18/2022 CLINICAL DATA:  Altered mental status, generalized weakness. EXAM: PORTABLE CHEST 1 VIEW COMPARISON:  10/07/2021. FINDINGS: Heart is enlarged and the mediastinal contour stable. The pulmonary vasculature is distended. Lung volumes are low. No consolidation, effusion, or pneumothorax. Spinal fusion hardware is noted in the cervical, thoracic, and lumbar spine. IMPRESSION: Cardiomegaly with pulmonary vascular congestion. Electronically Signed   By: Brett Fairy M.D.   On: 01/18/2022 02:16    Procedures Procedures    CRITICAL CARE Performed by: Larene Pickett   Total critical care time: 45 minutes  Critical care time was exclusive of separately billable procedures and treating other patients.  Critical care was necessary to treat or prevent imminent or life-threatening deterioration.  Critical care was time spent personally by me on the following activities: development of treatment plan with patient and/or surrogate as well as nursing, discussions with consultants, evaluation of patient's response to treatment, examination of patient, obtaining history from patient or surrogate, ordering and performing treatments and interventions, ordering and review of laboratory studies, ordering and review of  radiographic studies, pulse oximetry and re-evaluation of patient's condition.   Medications Ordered in ED Medications - No data to display  ED Course/ Medical Decision Making/ A&P                           Medical Decision Making Amount and/or Complexity of Data Reviewed Labs: ordered. Radiology: ordered and independent interpretation performed. ECG/medicine tests: ordered and independent interpretation performed.  Risk OTC drugs. Decision regarding hospitalization.  61 year old male presenting to the ED with altered mental status.  Wife states when she returned home from work he complained of not feeling well and continued to decline throughout the evening.  He did already take his Seroquel, unsure if this is contributing to his sleepiness.  He is sleeping on initial assessment but does arouse appropriately.  He is able to answer questions and follow commands.  He has had some cough but nonproductive.  No noted fever or chills at home.  Oral temp of 99.97F on arrival, mildly tachycardic but BP stable.  Concern for possible sepsis.  Will check labs, CXR, cultures, covid screen.    Labs as above--leukocytosis of 16.7, lactate 2.3.  He does appear his lactate are chronically elevated compared with prior.  Chemistry without any significant electrolyte derangement.  UA with21-30 WBCs, leuks present without bacteria.  Culture pending.  Covid screen negative.  CXR with vascular congestion, question if this may represent underlying CAP.    4:32 AM Reassessed-- he is starting to arouse more rapidly, stay awake for questioning at this time.  Wife states he has improved some but still not back to baseline.  Does have rectal temp of 101.66F.  UA with 21-30 WBCs, leuks without noted bacteria.  CXR also possible PNA vs vascular congestion.  Urine and blood cultures pending.  Will start rocephin, aztihromycin for coverage of both potential sources.  Abdomen soft, non-tender, no meningeal signs.  Will  admit for ongoing care.  Discussed with hospitalist, Dr. Marlowe Sax-- will admit for ongoing care. Requested CT head which has ordered.  Final Clinical Impression(s) / ED Diagnoses Final diagnoses:  Fever, unspecified fever cause  Altered mental status, unspecified altered mental status type    Rx / DC Orders ED Discharge Orders     None         Larene Pickett, PA-C 01/18/22 0555    Mesner, [name], MD 01/18/22 0600

## 2022-01-18 NOTE — ED Provider Notes (Signed)
I provided a substantive portion of the care of this patient.  I personally performed the entirety of the history, exam, and medical decision making for this encounter.  Not acting normal per wife but seems to be improving. Has difficulty sleeping and hasn't slept more than 1.5 hours a night for the last 5 nights. Takes multiple medications to help with sleep and pain. Tonight he was groggy and not acting like himself. No particular complaints. Patient feels really tired but not really able to hold conversation from sleepiness.  Diaphoretic and mildly tacyhycardic. Hoarse voice. Lungs with mild crackles. Abdomen benign.  Will recheck temperature. Consider septic workup depending on that. Urine possibly infected - pyelo? Prostatits? Doubt meningitis at this time. Suspect will need admission.       Rodney Leach, [name], MD 01/18/22 [phone]

## 2022-01-18 NOTE — ED Notes (Signed)
LACTIC 2.3, MESNER MD NOTIFIED

## 2022-01-18 NOTE — Inpatient Diabetes Management (Signed)
Inpatient Diabetes Program Recommendations  AACE/ADA: New Consensus Statement on Inpatient Glycemic Control (2015)  Target Ranges:  Prepandial:   less than 140 mg/dL      Peak postprandial:   less than 180 mg/dL (1-2 hours)      Critically ill patients:  140 - 180 mg/dL   Lab Results  Component Value Date   GLUCAP 336 (H) 01/18/2022   HGBA1C 9.2 (H) 01/18/2022    Review of Glycemic Control  Latest Reference Range & Units 01/18/22 08:56 01/18/22 10:53 01/18/22 12:11  Glucose-Capillary 70 - 99 mg/dL 394 (H) 326 (H) 336 (H)  (H): Data is abnormally high  Diabetes history: DM2 Outpatient Diabetes medications: Metformin 1000 mg BID Current orders for Inpatient glycemic control: Semglee 5 units QD, Novolog 0-15 units TID   Inpatient Diabetes Program Recommendations:    Semglee 16 units QD (0.1 units x 156.5kg) Novolog 0-5 units QHS  Will continue to follow while inpatient.  Thank you, [name], MSN, [place] Diabetes Coordinator Inpatient Diabetes Program [phone] (team pager from 8a-5p)

## 2022-01-18 NOTE — Progress Notes (Signed)
Placed patient on CPAP for the night via auto-mode with oxygen set at 2lpm  

## 2022-01-19 DIAGNOSIS — A419 Sepsis, unspecified organism: Secondary | ICD-10-CM

## 2022-01-19 DIAGNOSIS — G4733 Obstructive sleep apnea (adult) (pediatric): Secondary | ICD-10-CM

## 2022-01-19 DIAGNOSIS — F319 Bipolar disorder, unspecified: Secondary | ICD-10-CM | POA: Diagnosis not present

## 2022-01-19 DIAGNOSIS — G9341 Metabolic encephalopathy: Secondary | ICD-10-CM | POA: Diagnosis not present

## 2022-01-19 DIAGNOSIS — E039 Hypothyroidism, unspecified: Secondary | ICD-10-CM

## 2022-01-19 LAB — BLOOD CULTURE ID PANEL (REFLEXED) - BCID2

## 2022-01-19 LAB — BASIC METABOLIC PANEL
Anion gap: 10 (ref 5–15)
BUN: 18 mg/dL (ref 8–23)
CO2: 23 mmol/L (ref 22–32)
Calcium: 8.1 mg/dL — ABNORMAL LOW (ref 8.9–10.3)
Chloride: 95 mmol/L — ABNORMAL LOW (ref 98–111)
Creatinine, Ser: 1.45 mg/dL — ABNORMAL HIGH (ref 0.61–1.24)
GFR, Estimated: 54 mL/min — ABNORMAL LOW (ref >60)
Glucose, Bld: 287 mg/dL — ABNORMAL HIGH (ref 70–99)
Potassium: 3.8 mmol/L (ref 3.5–5.1)
Sodium: 128 mmol/L — ABNORMAL LOW (ref 135–145)

## 2022-01-19 LAB — GLUCOSE, CAPILLARY
Glucose-Capillary: 263 mg/dL — ABNORMAL HIGH (ref 70–99)
Glucose-Capillary: 259 mg/dL — ABNORMAL HIGH (ref 70–99)
Glucose-Capillary: 221 mg/dL — ABNORMAL HIGH (ref 70–99)
Glucose-Capillary: 287 mg/dL — ABNORMAL HIGH (ref 70–99)

## 2022-01-19 LAB — CBC
HCT: 42.4 % (ref 39.0–52.0)
Hemoglobin: 15 g/dL (ref 13.0–17.0)
MCH: 32.2 pg (ref 26.0–34.0)
MCHC: 35.4 g/dL (ref 30.0–36.0)
MCV: 91 fL (ref 80.0–100.0)
Platelets: 143 10*3/uL — ABNORMAL LOW (ref 150–400)
RBC: 4.66 MIL/uL (ref 4.22–5.81)
RDW: 14.2 % (ref 11.5–15.5)
WBC: 17.7 10*3/uL — ABNORMAL HIGH (ref 4.0–10.5)
nRBC: 0 % (ref 0.0–0.2)

## 2022-01-19 LAB — URINE CULTURE

## 2022-01-19 MED ORDER — INSULIN ASPART 100 UNIT/ML IJ SOLN
0.0000 [IU] | Freq: Three times a day (TID) | INTRAMUSCULAR | Status: DC
  Administered 2022-01-19: 5 [IU] via SUBCUTANEOUS
  Administered 2022-01-19: 8 [IU] via SUBCUTANEOUS
  Administered 2022-01-20: 3 [IU] via SUBCUTANEOUS

## 2022-01-19 MED ORDER — ORAL CARE MOUTH RINSE
15.0000 mL | Freq: Two times a day (BID) | OROMUCOSAL | Status: DC
  Administered 2022-01-19: 15 mL via OROMUCOSAL

## 2022-01-19 MED ORDER — INSULIN GLARGINE-YFGN 100 UNIT/ML ~~LOC~~ SOLN
25.0000 [IU] | Freq: Every day | SUBCUTANEOUS | Status: DC
  Filled 2022-01-19: qty 0.25

## 2022-01-19 MED ORDER — INSULIN GLARGINE-YFGN 100 UNIT/ML ~~LOC~~ SOLN
10.0000 [IU] | Freq: Once | SUBCUTANEOUS | Status: AC
  Administered 2022-01-19: 10 [IU] via SUBCUTANEOUS
  Filled 2022-01-19: qty 0.1

## 2022-01-19 MED ORDER — INSULIN ASPART 100 UNIT/ML IJ SOLN
0.0000 [IU] | Freq: Every day | INTRAMUSCULAR | Status: DC
  Administered 2022-01-19: 3 [IU] via SUBCUTANEOUS

## 2022-01-19 NOTE — Progress Notes (Signed)
Placed patient on CPAP for the night via auto-mode.  

## 2022-01-19 NOTE — Progress Notes (Signed)
PHARMACY - PHYSICIAN COMMUNICATION CRITICAL VALUE ALERT - BLOOD CULTURE IDENTIFICATION (BCID)  Rodney SWALLOWS is an 61 y.o. male who presented to [facility] on 01/18/2022 with a chief complaint of sepsis  Assessment:  Staphylococcus species 1/4 bottles, likely contaminant  Name of physician (or Provider) Contacted: smith  Current antibiotics: Ceftriaxone  Changes to prescribed antibiotics recommended:  Patient is on recommended antibiotics - No changes needed  Results for orders placed or performed during the hospital encounter of 01/18/22  Blood Culture ID Panel (Reflexed) (Collected: 01/18/2022  4:30 AM)  Result Value Ref Range   Enterococcus faecalis NOT DETECTED NOT DETECTED   Enterococcus Faecium NOT DETECTED NOT DETECTED   Listeria monocytogenes NOT DETECTED NOT DETECTED   Staphylococcus species DETECTED (A) NOT DETECTED   Staphylococcus aureus (BCID) NOT DETECTED NOT DETECTED   Staphylococcus epidermidis NOT DETECTED NOT DETECTED   Staphylococcus lugdunensis NOT DETECTED NOT DETECTED   Streptococcus species NOT DETECTED NOT DETECTED   Streptococcus agalactiae NOT DETECTED NOT DETECTED   Streptococcus pneumoniae NOT DETECTED NOT DETECTED   Streptococcus pyogenes NOT DETECTED NOT DETECTED   A.calcoaceticus-baumannii NOT DETECTED NOT DETECTED   Bacteroides fragilis NOT DETECTED NOT DETECTED   Enterobacterales NOT DETECTED NOT DETECTED   Enterobacter cloacae complex NOT DETECTED NOT DETECTED   Escherichia coli NOT DETECTED NOT DETECTED   Klebsiella aerogenes NOT DETECTED NOT DETECTED   Klebsiella oxytoca NOT DETECTED NOT DETECTED   Klebsiella pneumoniae NOT DETECTED NOT DETECTED   Proteus species NOT DETECTED NOT DETECTED   Salmonella species NOT DETECTED NOT DETECTED   Serratia marcescens NOT DETECTED NOT DETECTED   Haemophilus influenzae NOT DETECTED NOT DETECTED   Neisseria meningitidis NOT DETECTED NOT DETECTED   Pseudomonas aeruginosa NOT DETECTED NOT DETECTED    Stenotrophomonas maltophilia NOT DETECTED NOT DETECTED   Candida albicans NOT DETECTED NOT DETECTED   Candida auris NOT DETECTED NOT DETECTED   Candida glabrata NOT DETECTED NOT DETECTED   Candida krusei NOT DETECTED NOT DETECTED   Candida parapsilosis NOT DETECTED NOT DETECTED   Candida tropicalis NOT DETECTED NOT DETECTED   Cryptococcus neoformans/gattii NOT DETECTED NOT DETECTED    Jodean Lima Naviah Belfield 01/19/2022  5:54 AM

## 2022-01-19 NOTE — Plan of Care (Signed)
  Problem: Health Behavior/Discharge Planning: Goal: Ability to manage health-related needs will improve Outcome: Progressing   Problem: Metabolic: Goal: Ability to maintain appropriate glucose levels will improve Outcome: Progressing   Problem: Skin Integrity: Goal: Risk for impaired skin integrity will decrease Outcome: Progressing

## 2022-01-19 NOTE — Progress Notes (Signed)
PROGRESS NOTE    Rodney Leach  DPO:[id number] DOB: Oct 24, 1960 DOA: 01/18/2022 PCP: Leonides Sake, MD    Brief Narrative:  61 year old with history of hypertension, coronary artery disease, history of stroke, hypothyroidism, bipolar disorder presented with not feeling well for last few days from home.  Had some productive cough and viral symptoms.  Increased urinary frequency.  In the emergency room temperature 101.2, heart rate 103, blood pressure 79/44 and quickly responded to IV fluids.  WC count 16.7.  Lactic acid 2.3.  Chest x-ray with cardiomegaly with pulmonary vascular congestion.  Urinalysis with 21-50 WBCs.  CT head normal.  Given Tylenol, Rocephin and azithromycin and admitted to the hospital due to significant symptoms. Remains in the hospital undergoing infection work-up.   Assessment & Plan:   Sepsis present on admission, source unknown.  Possible viral symptom.  Final tests pending. Blood cultures and urine cultures pending. COVID-19 and influenza negative, respiratory virus panel negative. Continue empiric Rocephin and doxycycline, chest physiotherapy and mobility.  Continue antibiotics until final cultures for next 24 hours.  Avoiding further IV fluids.  Encourage oral intake. Blood pressures adequately responded.  Acute metabolic encephalopathy: Probably multifactorial.  Suspect infected metabolic encephalopathy. UDS positive for benzodiazepine but patient is on treatment at home.  Valproic acid and ammonia levels normal.  Mental status has much improved today.  Bipolar disorder: On multiple home regimen including Paxil, Seroquel and Depakote that he will continue.  Type 2 diabetes, uncontrolled.  Blood sugars 316 on presentation.  Hemoglobin A1c 9.2.  Patient on insulin at home.  That is resumed.  Continue monitoring.  Hyponatremia: Pseudohyponatremia due to elevated blood sugars.  Stable.  Coronary artery disease: Stable.  Cardiac cath 5/25 with nonobstructive  coronary artery disease and normal ejection fraction.  Continue aspirin.  Smoker: Counseled to quit.  OSA on CPAP: Continue CPAP at night.  Morbid obesity: BMI more than 40.  Will definitely benefit with weight loss and exercise.    DVT prophylaxis:   Lovenox subcu   Code Status: Full code Family Communication: Wife called, she was unable to pick up the phone. Disposition Plan: Status is: Inpatient Remains inpatient appropriate because: Treatment for sepsis, IV antibiotics     Consultants:  None  Procedures:  None  Antimicrobials:  Rocephin and doxycycline 6/10---   Subjective: Patient seen and examined.  Feels much better.  Strength is back.  Wearing CPAP.  Slept better last night.  Afebrile overnight.  Denies any nausea vomiting chest pain.  He does have nasal intonation but that is usual for him. Blood cultures with contaminant.  Blood pressures are adequate.  Objective: Vitals:   01/18/22 2112 01/18/22 2300 01/19/22 0452 01/19/22 0900  BP: 109/60  108/61 112/66  Pulse: 85 81 78 80  Resp: '18 19 18 20  '$ Temp: 99 F (37.2 C)  98.4 F (36.9 C) 98 F (36.7 C)  TempSrc: Oral   Oral  SpO2: 94% 95% 95% 96%  Weight:      Height:        Intake/Output Summary (Last 24 hours) at 01/19/2022 1028 Last data filed at 01/19/2022 0600 Gross per 24 hour  Intake 2581.41 ml  Output 1725 ml  Net 856.41 ml   Filed Weights   01/18/22 0104  Weight: (!) 156.5 kg    Examination:  General exam: Appears calm and comfortable.  Not in distress. Respiratory system: Clear to auscultation. Respiratory effort normal.  Wearing CPAP but on room air. Cardiovascular system: S1 &  S2 heard, RRR. No JVD, murmurs, rubs, gallops or clicks. No pedal edema. Gastrointestinal system: Abdomen is nondistended, soft and nontender. No organomegaly or masses felt. Normal bowel sounds heard.  Obese and pendulous. Central nervous system: Alert and oriented. No focal neurological deficits. Extremities:  Symmetric 5 x 5 power. Skin: No rashes, lesions or ulcers Psychiatry: Judgement and insight appear normal. Mood & affect appropriate.     Data Reviewed: I have personally reviewed following labs and imaging studies  CBC: Recent Labs  Lab 01/18/22 0200 01/18/22 0807 01/19/22 0437  WBC 16.7*  --  17.7*  NEUTROABS 12.5*  --   --   HGB 16.7 16.3 15.0  HCT 47.6 48.0 42.4  MCV 91.7  --  91.0  PLT 178  --  027*   Basic Metabolic Panel: Recent Labs  Lab 01/18/22 0200 01/18/22 0807 01/19/22 0437  NA 127* 127* 128*  K 4.2 4.4 3.8  CL 92*  --  95*  CO2 23  --  23  GLUCOSE 316*  --  287*  BUN 12  --  18  CREATININE 1.23  --  1.45*  CALCIUM 8.7*  --  8.1*   GFR: Estimated Creatinine Clearance: 84.6 mL/min (A) (by C-G formula based on SCr of 1.45 mg/dL (H)). Liver Function Tests: Recent Labs  Lab 01/18/22 0200  AST 14*  ALT 18  ALKPHOS 84  BILITOT 0.8  PROT 6.7  ALBUMIN 3.2*   No results for input(s): "LIPASE", "AMYLASE" in the last 168 hours. Recent Labs  Lab 01/18/22 0200  AMMONIA 20   Coagulation Profile: No results for input(s): "INR", "PROTIME" in the last 168 hours. Cardiac Enzymes: No results for input(s): "CKTOTAL", "CKMB", "CKMBINDEX", "TROPONINI" in the last 168 hours. BNP (last 3 results) No results for input(s): "PROBNP" in the last 8760 hours. HbA1C: Recent Labs    01/18/22 0749  HGBA1C 9.2*   CBG: Recent Labs  Lab 01/18/22 1053 01/18/22 1211 01/18/22 1609 01/18/22 2109 01/19/22 0945  GLUCAP 326* 336* 307* 317* 263*   Lipid Profile: No results for input(s): "CHOL", "HDL", "LDLCALC", "TRIG", "CHOLHDL", "LDLDIRECT" in the last 72 hours. Thyroid Function Tests: Recent Labs    01/18/22 0739  TSH 3.362   Anemia Panel: No results for input(s): "VITAMINB12", "FOLATE", "FERRITIN", "TIBC", "IRON", "RETICCTPCT" in the last 72 hours. Sepsis Labs: Recent Labs  Lab 01/18/22 0200 01/18/22 1141  LATICACIDVEN 2.3* 3.2*    Recent Results  (from the past 240 hour(s))  SARS Coronavirus 2 by RT PCR (hospital order, performed in [facility] hospital lab) *cepheid single result test* Anterior Nasal Swab     Status: None   Collection Time: 01/18/22  1:39 AM   Specimen: Anterior Nasal Swab  Result Value Ref Range Status   SARS Coronavirus 2 by RT PCR NEGATIVE NEGATIVE Final    Comment: (NOTE) SARS-CoV-2 target nucleic acids are NOT DETECTED.  The SARS-CoV-2 RNA is generally detectable in upper and lower respiratory specimens during the acute phase of infection. The lowest concentration of SARS-CoV-2 viral copies this assay can detect is 250 copies / mL. A negative result does not preclude SARS-CoV-2 infection and should not be used as the sole basis for treatment or other patient management decisions.  A negative result may occur with improper specimen collection / handling, submission of specimen other than nasopharyngeal swab, presence of viral mutation(s) within the areas targeted by this assay, and inadequate number of viral copies (<250 copies / mL). A negative result must be combined  with clinical observations, patient history, and epidemiological information.  Fact Sheet for Patients:   https://www.patel.info/  Fact Sheet for Healthcare Providers: https://hall.com/  This test is not yet approved or  cleared by the [place] FDA and has been authorized for detection and/or diagnosis of SARS-CoV-2 by FDA under an Emergency Use Authorization (EUA).  This EUA will remain in effect (meaning this test can be used) for the duration of the COVID-19 declaration under Section 564(b)(1) of the Act, 21 U.S.C. section 360bbb-3(b)(1), unless the authorization is terminated or revoked sooner.  Performed at [facility], [place] [address], [place], [place] [address]   Urine Culture     Status: Abnormal   Collection Time: 01/18/22  1:47 AM   Specimen: Urine, Clean Catch  Result  Value Ref Range Status   Specimen Description URINE, CLEAN CATCH  Final   Special Requests   Final    NONE Performed at [facility], [place] [address] [place], [place] [address]    Culture MULTIPLE SPECIES PRESENT, SUGGEST RECOLLECTION (A)  Final   Report Status 01/19/2022 FINAL  Final  Blood culture (routine x 2)     Status: None (Preliminary result)   Collection Time: 01/18/22  2:00 AM   Specimen: BLOOD  Result Value Ref Range Status   Specimen Description BLOOD SITE NOT SPECIFIED  Final   Special Requests   Final    BOTTLES DRAWN AEROBIC AND ANAEROBIC Blood Culture adequate volume   Culture   Final    NO GROWTH < 12 HOURS Performed at [facility], [place] [address], [place], [place] [address]    Report Status PENDING  Incomplete  Blood culture (routine x 2)     Status: None (Preliminary result)   Collection Time: 01/18/22  4:30 AM   Specimen: BLOOD  Result Value Ref Range Status   Specimen Description BLOOD SITE NOT SPECIFIED  Final   Special Requests   Final    BOTTLES DRAWN AEROBIC AND ANAEROBIC Blood Culture adequate volume   Culture  Setup Time   Final    GRAM POSITIVE COCCI ANAEROBIC BOTTLE ONLY CRITICAL RESULT CALLED TO, READ BACK BY AND VERIFIED WITH: T RUDISILL,PHARMD'@0528'$  01/19/22 Performed at [facility], [place] [address] [place], [place] [address]    Culture GRAM POSITIVE COCCI  Final   Report Status PENDING  Incomplete  Blood Culture ID Panel (Reflexed)     Status: Abnormal   Collection Time: 01/18/22  4:30 AM  Result Value Ref Range Status   Enterococcus faecalis NOT DETECTED NOT DETECTED Final   Enterococcus Faecium NOT DETECTED NOT DETECTED Final   Listeria monocytogenes NOT DETECTED NOT DETECTED Final   Staphylococcus species DETECTED (A) NOT DETECTED Final    Comment: CRITICAL RESULT CALLED TO, READ BACK BY AND VERIFIED WITH: T RUDISILL,PHARMD'@0528'$  01/19/22 [place]    Staphylococcus aureus (BCID) NOT DETECTED NOT DETECTED Final    Staphylococcus epidermidis NOT DETECTED NOT DETECTED Final   Staphylococcus lugdunensis NOT DETECTED NOT DETECTED Final   Streptococcus species NOT DETECTED NOT DETECTED Final   Streptococcus agalactiae NOT DETECTED NOT DETECTED Final   Streptococcus pneumoniae NOT DETECTED NOT DETECTED Final   Streptococcus pyogenes NOT DETECTED NOT DETECTED Final   A.calcoaceticus-baumannii NOT DETECTED NOT DETECTED Final   Bacteroides fragilis NOT DETECTED NOT DETECTED Final   Enterobacterales NOT DETECTED NOT DETECTED Final   Enterobacter cloacae complex NOT DETECTED NOT DETECTED Final   Escherichia coli NOT DETECTED NOT DETECTED Final  Klebsiella aerogenes NOT DETECTED NOT DETECTED Final   Klebsiella oxytoca NOT DETECTED NOT DETECTED Final   Klebsiella pneumoniae NOT DETECTED NOT DETECTED Final   Proteus species NOT DETECTED NOT DETECTED Final   Salmonella species NOT DETECTED NOT DETECTED Final   Serratia marcescens NOT DETECTED NOT DETECTED Final   Haemophilus influenzae NOT DETECTED NOT DETECTED Final   Neisseria meningitidis NOT DETECTED NOT DETECTED Final   Pseudomonas aeruginosa NOT DETECTED NOT DETECTED Final   Stenotrophomonas maltophilia NOT DETECTED NOT DETECTED Final   Candida albicans NOT DETECTED NOT DETECTED Final   Candida auris NOT DETECTED NOT DETECTED Final   Candida glabrata NOT DETECTED NOT DETECTED Final   Candida krusei NOT DETECTED NOT DETECTED Final   Candida parapsilosis NOT DETECTED NOT DETECTED Final   Candida tropicalis NOT DETECTED NOT DETECTED Final   Cryptococcus neoformans/gattii NOT DETECTED NOT DETECTED Final    Comment: Performed at [facility], [place] [address], [place], [place] [address]  Respiratory (~20 pathogens) panel by PCR     Status: None   Collection Time: 01/18/22 11:33 AM   Specimen: Nasopharyngeal Swab; Respiratory  Result Value Ref Range Status   Adenovirus NOT DETECTED NOT DETECTED Final   Coronavirus 229E NOT DETECTED NOT DETECTED Final     Comment: (NOTE) The Coronavirus on the Respiratory Panel, DOES NOT test for the novel  Coronavirus (2019 nCoV)    Coronavirus HKU1 NOT DETECTED NOT DETECTED Final   Coronavirus NL63 NOT DETECTED NOT DETECTED Final   Coronavirus OC43 NOT DETECTED NOT DETECTED Final   Metapneumovirus NOT DETECTED NOT DETECTED Final   Rhinovirus / Enterovirus NOT DETECTED NOT DETECTED Final   Influenza A NOT DETECTED NOT DETECTED Final   Influenza B NOT DETECTED NOT DETECTED Final   Parainfluenza Virus 1 NOT DETECTED NOT DETECTED Final   Parainfluenza Virus 2 NOT DETECTED NOT DETECTED Final   Parainfluenza Virus 3 NOT DETECTED NOT DETECTED Final   Parainfluenza Virus 4 NOT DETECTED NOT DETECTED Final   Respiratory Syncytial Virus NOT DETECTED NOT DETECTED Final   Bordetella pertussis NOT DETECTED NOT DETECTED Final   Bordetella Parapertussis NOT DETECTED NOT DETECTED Final   Chlamydophila pneumoniae NOT DETECTED NOT DETECTED Final   Mycoplasma pneumoniae NOT DETECTED NOT DETECTED Final    Comment: Performed at [facility], [place]. [address], [place], [place] [address]  Group A Strep by PCR     Status: None   Collection Time: 01/18/22 11:33 AM  Result Value Ref Range Status   Group A Strep by PCR NOT DETECTED NOT DETECTED Final    Comment: Performed at [facility], [place] [address], [place], [place] [address]         Radiology Studies: DG Neck Soft Tissue  Result Date: 01/18/2022 CLINICAL DATA:  Hoarseness EXAM: NECK SOFT TISSUES - 1+ VIEW COMPARISON:  None Available. FINDINGS: The oropharynx and hypopharynx appear normal. Normal epiglottis. Anterior cervical fusion noted from C5-C7. no prevertebral soft tissue swelling. IMPRESSION: No acute findings. Electronically Signed   By: Suzy Bouchard M.D.   On: 01/18/2022 15:47   CT HEAD WO CONTRAST (5MM)  Result Date: 01/18/2022 CLINICAL DATA:  Delirium EXAM: CT HEAD WITHOUT CONTRAST TECHNIQUE: Contiguous axial images were obtained from  the base of the skull through the vertex without intravenous contrast. RADIATION DOSE REDUCTION: This exam was performed according to the departmental dose-optimization program which includes automated exposure control, adjustment of the mA and/or kV according to patient size and/or use  of iterative reconstruction technique. COMPARISON:  Prior CT scan of the head 10/07/2021 FINDINGS: Brain: No evidence of acute infarction, hemorrhage, hydrocephalus, extra-axial collection or mass lesion/mass effect. Stable appearance of atrophy, chronic microvascular ischemic white matter disease and bilateral basal ganglia remote lacunar infarcts. Vascular: No hyperdense vessel or unexpected calcification. Skull: Normal. Negative for fracture or focal lesion. Sinuses/Orbits: No acute finding. Other: None. IMPRESSION: 1. No acute intracranial abnormality. 2. Stable chronic findings. Electronically Signed   By: Jacqulynn Cadet M.D.   On: 01/18/2022 07:13   DG Chest Port 1 View  Result Date: 01/18/2022 CLINICAL DATA:  Altered mental status, generalized weakness. EXAM: PORTABLE CHEST 1 VIEW COMPARISON:  10/07/2021. FINDINGS: Heart is enlarged and the mediastinal contour stable. The pulmonary vasculature is distended. Lung volumes are low. No consolidation, effusion, or pneumothorax. Spinal fusion hardware is noted in the cervical, thoracic, and lumbar spine. IMPRESSION: Cardiomegaly with pulmonary vascular congestion. Electronically Signed   By: Brett Fairy M.D.   On: 01/18/2022 02:16        Scheduled Meds:  aspirin EC  81 mg Oral Daily   divalproex  1,000 mg Oral BID   enoxaparin (LOVENOX) injection  80 mg Subcutaneous Q24H   famotidine  40 mg Oral Daily   folic acid  1 mg Oral QHS   gabapentin  400 mg Oral TID   guaiFENesin  600 mg Oral BID   insulin aspart  0-5 Units Subcutaneous QHS   insulin glargine-yfgn  16 Units Subcutaneous Daily   levothyroxine  175 mcg Oral Q0600   magnesium oxide  400 mg Oral QHS    mouth rinse  15 mL Mouth Rinse BID   PARoxetine  20 mg Oral Daily   QUEtiapine  800 mg Oral QHS   temazepam  15 mg Oral QHS   Continuous Infusions:  cefTRIAXone (ROCEPHIN)  IV 2 g (01/19/22 0946)   doxycycline (VIBRAMYCIN) IV Stopped (01/19/22 0232)     LOS: 1 day    Time spent: 35 minutes    Barb Merino, MD [facility] Pager [phone]

## 2022-01-19 NOTE — Inpatient Diabetes Management (Signed)
Inpatient Diabetes Program Recommendations  AACE/ADA: New Consensus Statement on Inpatient Glycemic Control (2015)  Target Ranges:  Prepandial:   less than 140 mg/dL      Peak postprandial:   less than 180 mg/dL (1-2 hours)      Critically ill patients:  140 - 180 mg/dL   Lab Results  Component Value Date   GLUCAP 263 (H) 01/19/2022   HGBA1C 9.2 (H) 01/18/2022    Review of Glycemic Control  Latest Reference Range & Units 01/18/22 08:56 01/18/22 10:53 01/18/22 12:11 01/18/22 16:09 01/18/22 21:09 01/19/22 09:45  Glucose-Capillary 70 - 99 mg/dL 394 (H) 326 (H) 336 (H) 307 (H) 317 (H) 263 (H)  (H): Data is abnormally high  Diabetes history: DM2 Outpatient Diabetes medications: Metformin 1000 mg BID Current orders for Inpatient glycemic control: Semglee 16 units QD, Novolog 0-5 units QHS  Inpatient Diabetes Program Recommendations:    1-Semglee 25 units QD (could give addisional 9 units now as he has had 16 units this morning)  2-Novlog 0-15 units TID and 0-5 QHS  Will continue to follow while inpatient.  Thank you, [name], MSN, [place] Diabetes Coordinator Inpatient Diabetes Program [phone] (team pager from 8a-5p)

## 2022-01-20 DIAGNOSIS — A419 Sepsis, unspecified organism: Secondary | ICD-10-CM | POA: Diagnosis not present

## 2022-01-20 DIAGNOSIS — G9341 Metabolic encephalopathy: Secondary | ICD-10-CM | POA: Diagnosis not present

## 2022-01-20 LAB — BASIC METABOLIC PANEL
Anion gap: 9 (ref 5–15)
BUN: 17 mg/dL (ref 8–23)
CO2: 24 mmol/L (ref 22–32)
Calcium: 8.5 mg/dL — ABNORMAL LOW (ref 8.9–10.3)
Chloride: 99 mmol/L (ref 98–111)
Creatinine, Ser: 0.92 mg/dL (ref 0.61–1.24)
GFR, Estimated: >60 mL/min (ref >60)
Glucose, Bld: 238 mg/dL — ABNORMAL HIGH (ref 70–99)
Potassium: 3.8 mmol/L (ref 3.5–5.1)
Sodium: 132 mmol/L — ABNORMAL LOW (ref 135–145)

## 2022-01-20 LAB — CBC WITH DIFFERENTIAL/PLATELET
Abs Immature Granulocytes: 0.06 10*3/uL (ref 0.00–0.07)
Basophils Absolute: 0.1 10*3/uL (ref 0.0–0.1)
Basophils Relative: 1 %
Eosinophils Absolute: 0.6 10*3/uL — ABNORMAL HIGH (ref 0.0–0.5)
Eosinophils Relative: 6 %
HCT: 43.6 % (ref 39.0–52.0)
Hemoglobin: 14.6 g/dL (ref 13.0–17.0)
Immature Granulocytes: 1 %
Lymphocytes Relative: 18 %
Lymphs Abs: 1.7 10*3/uL (ref 0.7–4.0)
MCH: 31 pg (ref 26.0–34.0)
MCHC: 33.5 g/dL (ref 30.0–36.0)
MCV: 92.6 fL (ref 80.0–100.0)
Monocytes Absolute: 1.1 10*3/uL — ABNORMAL HIGH (ref 0.1–1.0)
Monocytes Relative: 11 %
Neutro Abs: 6.3 10*3/uL (ref 1.7–7.7)
Neutrophils Relative %: 63 %
Platelets: 137 10*3/uL — ABNORMAL LOW (ref 150–400)
RBC: 4.71 MIL/uL (ref 4.22–5.81)
RDW: 14.2 % (ref 11.5–15.5)
WBC: 9.9 10*3/uL (ref 4.0–10.5)
nRBC: 0 % (ref 0.0–0.2)

## 2022-01-20 LAB — CULTURE, BLOOD (ROUTINE X 2): Special Requests: ADEQUATE

## 2022-01-20 LAB — PHOSPHORUS: Phosphorus: 3.1 mg/dL (ref 2.5–4.6)

## 2022-01-20 LAB — GLUCOSE, CAPILLARY: Glucose-Capillary: 199 mg/dL — ABNORMAL HIGH (ref 70–99)

## 2022-01-20 LAB — MAGNESIUM: Magnesium: 1.7 mg/dL (ref 1.7–2.4)

## 2022-01-20 LAB — STREP PNEUMONIAE URINARY ANTIGEN: Strep Pneumo Urinary Antigen: NEGATIVE

## 2022-01-20 MED ORDER — DOXYCYCLINE HYCLATE 100 MG PO TABS: 100.0000 mg | ORAL_TABLET | Freq: Two times a day (BID) | ORAL | 0 refills | Status: AC

## 2022-01-20 MED ORDER — INSULIN GLARGINE 100 UNIT/ML SOLOSTAR PEN: 20.0000 [IU] | PEN_INJECTOR | Freq: Every day | SUBCUTANEOUS | 11 refills | Status: DC

## 2022-01-20 MED ORDER — ASSURE ID SAFETY PEN NEEDLES 31G X 5 MM MISC: 1.0000 | Freq: Every day | 0 refills | Status: DC

## 2022-01-20 NOTE — Plan of Care (Signed)

## 2022-01-20 NOTE — Discharge Summary (Signed)
Physician Discharge Summary  Rodney Leach VHQ:[id number] DOB: October 29, 1960 DOA: 01/18/2022  PCP: Leonides Sake, MD  Admit date: 01/18/2022 Discharge date: 01/20/2022  Admitted From: Home Disposition: Home  Recommendations for Outpatient Follow-up:  Follow up with PCP in 1-2 weeks Please obtain BMP/CBC in one week We will hold your blood pressure medicine losartan until follow-up examination.  Home Health: N/A Equipment/Devices: N/A  Discharge Condition: Stable CODE STATUS: Full code Diet recommendation: Low-salt and low-carb diet  Discharge summary: 61 year old with history of hypertension, coronary artery disease, history of stroke, hypothyroidism, bipolar disorder presented with not feeling well for last few days from home.  Had some productive cough and viral symptoms.  Increased urinary frequency.  In the emergency room temperature 101.2, heart rate 103, blood pressure 79/44 and quickly responded to IV fluids.  WC count 16.7.  Lactic acid 2.3.  Chest x-ray with cardiomegaly with pulmonary vascular congestion.  Urinalysis with 21-50 WBCs.  CT head normal.  Given Tylenol, Rocephin and azithromycin and admitted to the hospital due to significant symptoms. He stayed in the hospital, undergoing infection work-up.  No source of infection was found.  Mental status improved and sleep improved.     Sepsis present on admission, source unknown.  Possible viral symptom.   Blood cultures with coag negative staph Urine culture with multiple species COVID-19 and influenza negative, respiratory virus panel negative. Sepsis physiology improved.  Received Rocephin and doxycycline.  Since he presented with respiratory symptoms, will treat with 5 more days of doxycycline.   Acute metabolic encephalopathy: Probably multifactorial.  Suspect infected metabolic encephalopathy. UDS positive for benzodiazepine but patient is on treatment at home.  Valproic acid and ammonia levels normal.   Mental  status normalized.  Sleep improved.   Bipolar disorder: On multiple home regimen including Paxil, Seroquel and Depakote that he will continue.   Type 2 diabetes, uncontrolled.  Blood sugars 316 on presentation.  Hemoglobin A1c 9.2.   Patient only taking metformin at home this time.  Used to be on insulin.  Agreeable to go back on long-acting insulin 20 units once a day along with metformin 1 g twice daily.    Coronary artery disease: Stable.  Cardiac cath 5/25 with nonobstructive coronary artery disease and normal ejection fraction.  Continue aspirin.   Smoker: Counseled to quit.   OSA on CPAP: Continue CPAP at night.   Symptoms improved.  Medically stabilized.  Able to go home today.  Discharge Diagnoses:  Principal Problem:   Sepsis ([place]) Active Problems:   Depression   Transient hypotension   Acute metabolic encephalopathy   Bipolar depression ([name])   Hyponatremia   Type 2 diabetes mellitus with complication, without long-term current use of insulin ([name])   CAD (coronary artery disease)   Hypothyroidism   Obstructive sleep apnea   Morbid obesity ([place])   Cigarette smoker    Discharge Instructions  Discharge Instructions     Call MD for:  temperature >100.4   Complete by: As directed    Diet - low sodium heart healthy   Complete by: As directed    Diet Carb Modified   Complete by: As directed    Discharge instructions   Complete by: As directed    You are prescribed insulin , check your blood sugars at home  Hold off taking losartan , your blood pressure pill until you see your doctor in follow up   Increase activity slowly   Complete by: As directed  Allergies as of 01/20/2022       Reactions   Carbamazepine Hives   Adhesive [tape] Rash   Sulfa Antibiotics Rash        Medication List     STOP taking these medications    rosuvastatin 5 MG tablet Commonly known as: CRESTOR       TAKE these medications    albuterol 108 (90 Base) MCG/ACT  inhaler Commonly known as: VENTOLIN HFA Inhale 2 puffs into the lungs every 6 (six) hours as needed for shortness of breath.   amLODipine 10 MG tablet Commonly known as: NORVASC Take 1 tablet (10 mg total) by mouth daily.   aspirin EC 81 MG tablet Take 81 mg by mouth daily. Swallow whole.   Assure ID Safety Pen Needles 31G X 5 MM Misc Generic drug: Insulin Pen Needle 1 each by Does not apply route daily.   divalproex 500 MG DR tablet Commonly known as: DEPAKOTE Take 2 tablets (1,000 mg total) by mouth 2 (two) times daily.   doxycycline 100 MG tablet Commonly known as: VIBRA-TABS Take 1 tablet (100 mg total) by mouth 2 (two) times daily for 5 days.   famotidine 40 MG tablet Commonly known as: PEPCID Take 1 tablet (40 mg total) by mouth daily.   folic acid 1 MG tablet Commonly known as: FOLVITE Take 1 tablet (1 mg total) by mouth at bedtime.   gabapentin 400 MG capsule Commonly known as: NEURONTIN Take 1 capsule (400 mg total) by mouth 3 (three) times daily.   insulin glargine 100 UNIT/ML Solostar Pen Commonly known as: LANTUS Inject 20 Units into the skin daily.   levothyroxine 175 MCG tablet Commonly known as: SYNTHROID Take 1 tablet (175 mcg total) by mouth daily at 6 (six) AM.   losartan 100 MG tablet Commonly known as: COZAAR Take 1 tablet (100 mg total) by mouth daily.   magnesium oxide 400 MG tablet Commonly known as: MAG-OX Take 400 mg by mouth at bedtime.   metFORMIN 1000 MG tablet Commonly known as: GLUCOPHAGE Take 1 tablet (1,000 mg total) by mouth 2 (two) times daily.   metoprolol succinate 25 MG 24 hr tablet Commonly known as: TOPROL-XL Take 1 tablet (25 mg total) by mouth daily.   nitroGLYCERIN 0.4 MG SL tablet Commonly known as: NITROSTAT Place 1 tablet (0.4 mg total) under the tongue every 5 (five) minutes as needed for chest pain.   NON FORMULARY CPAP at bedtime   PARoxetine 20 MG tablet Commonly known as: PAXIL Take 1 tablet (20 mg  total) by mouth daily.   QUEtiapine 400 MG tablet Commonly known as: SEROQUEL Take 2 tablets (800 mg total) by mouth at bedtime.   temazepam 15 MG capsule Commonly known as: RESTORIL Take 1 capsule (15 mg total) by mouth at bedtime.   Vraylar 1.5 MG capsule Generic drug: cariprazine Take 1.5 mg by mouth daily.        Allergies  Allergen Reactions   Carbamazepine Hives   Adhesive [Tape] Rash   Sulfa Antibiotics Rash    Consultations: None   Procedures/Studies: DG Neck Soft Tissue  Result Date: 01/18/2022 CLINICAL DATA:  Hoarseness EXAM: NECK SOFT TISSUES - 1+ VIEW COMPARISON:  None Available. FINDINGS: The oropharynx and hypopharynx appear normal. Normal epiglottis. Anterior cervical fusion noted from C5-C7. no prevertebral soft tissue swelling. IMPRESSION: No acute findings. Electronically Signed   By: Suzy Bouchard M.D.   On: 01/18/2022 15:47   CT HEAD WO CONTRAST (5MM)  Result Date:  01/18/2022 CLINICAL DATA:  Delirium EXAM: CT HEAD WITHOUT CONTRAST TECHNIQUE: Contiguous axial images were obtained from the base of the skull through the vertex without intravenous contrast. RADIATION DOSE REDUCTION: This exam was performed according to the departmental dose-optimization program which includes automated exposure control, adjustment of the mA and/or kV according to patient size and/or use of iterative reconstruction technique. COMPARISON:  Prior CT scan of the head 10/07/2021 FINDINGS: Brain: No evidence of acute infarction, hemorrhage, hydrocephalus, extra-axial collection or mass lesion/mass effect. Stable appearance of atrophy, chronic microvascular ischemic white matter disease and bilateral basal ganglia remote lacunar infarcts. Vascular: No hyperdense vessel or unexpected calcification. Skull: Normal. Negative for fracture or focal lesion. Sinuses/Orbits: No acute finding. Other: None. IMPRESSION: 1. No acute intracranial abnormality. 2. Stable chronic findings.  Electronically Signed   By: Jacqulynn Cadet M.D.   On: 01/18/2022 07:13   DG Chest Port 1 View  Result Date: 01/18/2022 CLINICAL DATA:  Altered mental status, generalized weakness. EXAM: PORTABLE CHEST 1 VIEW COMPARISON:  10/07/2021. FINDINGS: Heart is enlarged and the mediastinal contour stable. The pulmonary vasculature is distended. Lung volumes are low. No consolidation, effusion, or pneumothorax. Spinal fusion hardware is noted in the cervical, thoracic, and lumbar spine. IMPRESSION: Cardiomegaly with pulmonary vascular congestion. Electronically Signed   By: Brett Fairy M.D.   On: 01/18/2022 02:16   CARDIAC CATHETERIZATION  Result Date: 01/03/2022   Prox LAD to Mid LAD lesion is 20% stenosed.   Dist Cx lesion is 20% stenosed with 20% stenosed side branch in LPAV.   Prox RCA lesion is 20% stenosed.   The left ventricular systolic function is normal.   LV end diastolic pressure is normal.   The left ventricular ejection fraction is 55-65% by visual estimate. Mild nonobstructive CAD Normal LV function Normal LVEDP Plan: aggressive risk factor modification.   (Echo, Carotid, EGD, Colonoscopy, ERCP)    Subjective: Patient seen and examined.  No overnight events.  He was so happy that he could sleep well last night.  Remains afebrile since admission.  Blood pressures adequately responded and normalized.  Eager to go home.   Discharge Exam: Vitals:   01/19/22 2146 01/20/22 0458  BP:  98/64  Pulse: 81 78  Resp: 18 19  Temp:  (!) 97.5 F (36.4 C)  SpO2: 96% 97%   Vitals:   01/19/22 1650 01/19/22 2101 01/19/22 2146 01/20/22 0458  BP:  (!) 120/93  98/64  Pulse:  60 81 78  Resp: '18 19 18 19  '$ Temp:  97.7 F (36.5 C)  (!) 97.5 F (36.4 C)  TempSrc:  Oral    SpO2:  95% 96% 97%  Weight:    (!) 150.4 kg  Height:        General: Pt is alert, awake, not in acute distress Cardiovascular: RRR, S1/S2 +, no rubs, no gallops Respiratory: CTA bilaterally, no wheezing, no rhonchi Abdominal:  Soft, NT, ND, bowel sounds + Extremities: no edema, no cyanosis    The results of significant diagnostics from this hospitalization (including imaging, microbiology, ancillary and laboratory) are listed below for reference.     Microbiology: Recent Results (from the past 240 hour(s))  SARS Coronavirus 2 by RT PCR (hospital order, performed in [facility] hospital lab) *cepheid single result test* Anterior Nasal Swab     Status: None   Collection Time: 01/18/22  1:39 AM   Specimen: Anterior Nasal Swab  Result Value Ref Range Status   SARS Coronavirus 2 by RT PCR NEGATIVE  NEGATIVE Final    Comment: (NOTE) SARS-CoV-2 target nucleic acids are NOT DETECTED.  The SARS-CoV-2 RNA is generally detectable in upper and lower respiratory specimens during the acute phase of infection. The lowest concentration of SARS-CoV-2 viral copies this assay can detect is 250 copies / mL. A negative result does not preclude SARS-CoV-2 infection and should not be used as the sole basis for treatment or other patient management decisions.  A negative result may occur with improper specimen collection / handling, submission of specimen other than nasopharyngeal swab, presence of viral mutation(s) within the areas targeted by this assay, and inadequate number of viral copies (<250 copies / mL). A negative result must be combined with clinical observations, patient history, and epidemiological information.  Fact Sheet for Patients:   https://www.patel.info/  Fact Sheet for Healthcare Providers: https://hall.com/  This test is not yet approved or  cleared by the [place] FDA and has been authorized for detection and/or diagnosis of SARS-CoV-2 by FDA under an Emergency Use Authorization (EUA).  This EUA will remain in effect (meaning this test can be used) for the duration of the COVID-19 declaration under Section 564(b)(1) of the Act, 21 U.S.C. section  360bbb-3(b)(1), unless the authorization is terminated or revoked sooner.  Performed at [facility], [place] [address], [place], [place] [address]   Urine Culture     Status: Abnormal   Collection Time: 01/18/22  1:47 AM   Specimen: Urine, Clean Catch  Result Value Ref Range Status   Specimen Description URINE, CLEAN CATCH  Final   Special Requests   Final    NONE Performed at [facility], [place] [address] [place], [place] [address]    Culture MULTIPLE SPECIES PRESENT, SUGGEST RECOLLECTION (A)  Final   Report Status 01/19/2022 FINAL  Final  Blood culture (routine x 2)     Status: None (Preliminary result)   Collection Time: 01/18/22  2:00 AM   Specimen: BLOOD  Result Value Ref Range Status   Specimen Description BLOOD SITE NOT SPECIFIED  Final   Special Requests   Final    BOTTLES DRAWN AEROBIC AND ANAEROBIC Blood Culture adequate volume   Culture   Final    NO GROWTH 2 DAYS Performed at [facility], [place] [address], [place], [place] [address]    Report Status PENDING  Incomplete  Blood culture (routine x 2)     Status: Abnormal (Preliminary result)   Collection Time: 01/18/22  4:30 AM   Specimen: BLOOD  Result Value Ref Range Status   Specimen Description BLOOD SITE NOT SPECIFIED  Final   Special Requests   Final    BOTTLES DRAWN AEROBIC AND ANAEROBIC Blood Culture adequate volume   Culture  Setup Time   Final    GRAM POSITIVE COCCI ANAEROBIC BOTTLE ONLY CRITICAL RESULT CALLED TO, READ BACK BY AND VERIFIED WITH: T RUDISILL,PHARMD'@0528'$  01/19/22    Culture (A)  Final    STAPHYLOCOCCUS HOMINIS THE SIGNIFICANCE OF ISOLATING THIS ORGANISM FROM A SINGLE SET OF BLOOD CULTURES WHEN MULTIPLE SETS ARE DRAWN IS UNCERTAIN. PLEASE NOTIFY THE MICROBIOLOGY DEPARTMENT WITHIN ONE WEEK IF SPECIATION AND SENSITIVITIES ARE REQUIRED. Performed at [facility], [place] [address] [place], [place] [address]    Report Status PENDING  Incomplete  Blood Culture ID Panel  (Reflexed)     Status: Abnormal   Collection Time: 01/18/22  4:30 AM  Result Value Ref Range Status   Enterococcus faecalis NOT DETECTED NOT DETECTED  Final   Enterococcus Faecium NOT DETECTED NOT DETECTED Final   Listeria monocytogenes NOT DETECTED NOT DETECTED Final   Staphylococcus species DETECTED (A) NOT DETECTED Final    Comment: CRITICAL RESULT CALLED TO, READ BACK BY AND VERIFIED WITH: T RUDISILL,PHARMD'@0528'$  01/19/22 [place]    Staphylococcus aureus (BCID) NOT DETECTED NOT DETECTED Final   Staphylococcus epidermidis NOT DETECTED NOT DETECTED Final   Staphylococcus lugdunensis NOT DETECTED NOT DETECTED Final   Streptococcus species NOT DETECTED NOT DETECTED Final   Streptococcus agalactiae NOT DETECTED NOT DETECTED Final   Streptococcus pneumoniae NOT DETECTED NOT DETECTED Final   Streptococcus pyogenes NOT DETECTED NOT DETECTED Final   A.calcoaceticus-baumannii NOT DETECTED NOT DETECTED Final   Bacteroides fragilis NOT DETECTED NOT DETECTED Final   Enterobacterales NOT DETECTED NOT DETECTED Final   Enterobacter cloacae complex NOT DETECTED NOT DETECTED Final   Escherichia coli NOT DETECTED NOT DETECTED Final   Klebsiella aerogenes NOT DETECTED NOT DETECTED Final   Klebsiella oxytoca NOT DETECTED NOT DETECTED Final   Klebsiella pneumoniae NOT DETECTED NOT DETECTED Final   Proteus species NOT DETECTED NOT DETECTED Final   Salmonella species NOT DETECTED NOT DETECTED Final   Serratia marcescens NOT DETECTED NOT DETECTED Final   Haemophilus influenzae NOT DETECTED NOT DETECTED Final   Neisseria meningitidis NOT DETECTED NOT DETECTED Final   Pseudomonas aeruginosa NOT DETECTED NOT DETECTED Final   Stenotrophomonas maltophilia NOT DETECTED NOT DETECTED Final   Candida albicans NOT DETECTED NOT DETECTED Final   Candida auris NOT DETECTED NOT DETECTED Final   Candida glabrata NOT DETECTED NOT DETECTED Final   Candida krusei NOT DETECTED NOT DETECTED Final   Candida parapsilosis NOT  DETECTED NOT DETECTED Final   Candida tropicalis NOT DETECTED NOT DETECTED Final   Cryptococcus neoformans/gattii NOT DETECTED NOT DETECTED Final    Comment: Performed at [facility], [address] [place], [place] [address]  Respiratory (~20 pathogens) panel by PCR     Status: None   Collection Time: 01/18/22 11:33 AM   Specimen: Nasopharyngeal Swab; Respiratory  Result Value Ref Range Status   Adenovirus NOT DETECTED NOT DETECTED Final   Coronavirus 229E NOT DETECTED NOT DETECTED Final    Comment: (NOTE) The Coronavirus on the Respiratory Panel, DOES NOT test for the novel  Coronavirus (2019 nCoV)    Coronavirus HKU1 NOT DETECTED NOT DETECTED Final   Coronavirus NL63 NOT DETECTED NOT DETECTED Final   Coronavirus OC43 NOT DETECTED NOT DETECTED Final   Metapneumovirus NOT DETECTED NOT DETECTED Final   Rhinovirus / Enterovirus NOT DETECTED NOT DETECTED Final   Influenza A NOT DETECTED NOT DETECTED Final   Influenza B NOT DETECTED NOT DETECTED Final   Parainfluenza Virus 1 NOT DETECTED NOT DETECTED Final   Parainfluenza Virus 2 NOT DETECTED NOT DETECTED Final   Parainfluenza Virus 3 NOT DETECTED NOT DETECTED Final   Parainfluenza Virus 4 NOT DETECTED NOT DETECTED Final   Respiratory Syncytial Virus NOT DETECTED NOT DETECTED Final   Bordetella pertussis NOT DETECTED NOT DETECTED Final   Bordetella Parapertussis NOT DETECTED NOT DETECTED Final   Chlamydophila pneumoniae NOT DETECTED NOT DETECTED Final   Mycoplasma pneumoniae NOT DETECTED NOT DETECTED Final    Comment: Performed at [facility], [place]. [address] [place], [place] [address]  Group A Strep by PCR     Status: None   Collection Time: 01/18/22 11:33 AM  Result Value Ref Range Status   Group A Strep by PCR NOT DETECTED NOT DETECTED Final  Comment: Performed at [facility], [place] [address], [place], [place] [address]     Labs: BNP (last 3 results) Recent Labs    10/07/21 1544 01/18/22 0759  BNP  100.6* 803.2*   Basic Metabolic Panel: Recent Labs  Lab 01/18/22 0200 01/18/22 0807 01/19/22 0437 01/20/22 0258  NA 127* 127* 128* 132*  K 4.2 4.4 3.8 3.8  CL 92*  --  95* 99  CO2 23  --  23 24  GLUCOSE 316*  --  287* 238*  BUN 12  --  18 17  CREATININE 1.23  --  1.45* 0.92  CALCIUM 8.7*  --  8.1* 8.5*  MG  --   --   --  1.7  PHOS  --   --   --  3.1   Liver Function Tests: Recent Labs  Lab 01/18/22 0200  AST 14*  ALT 18  ALKPHOS 84  BILITOT 0.8  PROT 6.7  ALBUMIN 3.2*   No results for input(s): "LIPASE", "AMYLASE" in the last 168 hours. Recent Labs  Lab 01/18/22 0200  AMMONIA 20   CBC: Recent Labs  Lab 01/18/22 0200 01/18/22 0807 01/19/22 0437 01/20/22 0258  WBC 16.7*  --  17.7* 9.9  NEUTROABS 12.5*  --   --  6.3  HGB 16.7 16.3 15.0 14.6  HCT 47.6 48.0 42.4 43.6  MCV 91.7  --  91.0 92.6  PLT 178  --  143* 137*   Cardiac Enzymes: No results for input(s): "CKTOTAL", "CKMB", "CKMBINDEX", "TROPONINI" in the last 168 hours. BNP: Invalid input(s): "POCBNP" CBG: Recent Labs  Lab 01/19/22 0945 01/19/22 1127 01/19/22 1643 01/19/22 2106 01/20/22 0732  GLUCAP 263* 259* 221* 287* 199*   D-Dimer No results for input(s): "DDIMER" in the last 72 hours. Hgb A1c Recent Labs    01/18/22 0749  HGBA1C 9.2*   Lipid Profile No results for input(s): "CHOL", "HDL", "LDLCALC", "TRIG", "CHOLHDL", "LDLDIRECT" in the last 72 hours. Thyroid function studies Recent Labs    01/18/22 0739  TSH 3.362   Anemia work up No results for input(s): "VITAMINB12", "FOLATE", "FERRITIN", "TIBC", "IRON", "RETICCTPCT" in the last 72 hours. Urinalysis    Component Value Date/Time   COLORURINE YELLOW 01/18/2022 0223   APPEARANCEUR CLEAR 01/18/2022 0223   LABSPEC 1.025 01/18/2022 0223   PHURINE 5.0 01/18/2022 0223   GLUCOSEU >=500 (A) 01/18/2022 0223   HGBUR SMALL (A) 01/18/2022 0223   BILIRUBINUR NEGATIVE 01/18/2022 0223   KETONESUR 20 (A) 01/18/2022 0223   PROTEINUR 30  (A) 01/18/2022 0223   UROBILINOGEN 0.2 01/15/2013 0004   NITRITE NEGATIVE 01/18/2022 0223   LEUKOCYTESUR TRACE (A) 01/18/2022 0223   Sepsis Labs Recent Labs  Lab 01/18/22 0200 01/19/22 0437 01/20/22 0258  WBC 16.7* 17.7* 9.9   Microbiology Recent Results (from the past 240 hour(s))  SARS Coronavirus 2 by RT PCR (hospital order, performed in [facility] hospital lab) *cepheid single result test* Anterior Nasal Swab     Status: None   Collection Time: 01/18/22  1:39 AM   Specimen: Anterior Nasal Swab  Result Value Ref Range Status   SARS Coronavirus 2 by RT PCR NEGATIVE NEGATIVE Final    Comment: (NOTE) SARS-CoV-2 target nucleic acids are NOT DETECTED.  The SARS-CoV-2 RNA is generally detectable in upper and lower respiratory specimens during the acute phase of infection. The lowest concentration of SARS-CoV-2 viral copies this assay can detect is 250 copies / mL. A negative result does not preclude SARS-CoV-2 infection and should  not be used as the sole basis for treatment or other patient management decisions.  A negative result may occur with improper specimen collection / handling, submission of specimen other than nasopharyngeal swab, presence of viral mutation(s) within the areas targeted by this assay, and inadequate number of viral copies (<250 copies / mL). A negative result must be combined with clinical observations, patient history, and epidemiological information.  Fact Sheet for Patients:   https://www.patel.info/  Fact Sheet for Healthcare Providers: https://hall.com/  This test is not yet approved or  cleared by the [place] FDA and has been authorized for detection and/or diagnosis of SARS-CoV-2 by FDA under an Emergency Use Authorization (EUA).  This EUA will remain in effect (meaning this test can be used) for the duration of the COVID-19 declaration under Section 564(b)(1) of the Act, 21 U.S.C. section  360bbb-3(b)(1), unless the authorization is terminated or revoked sooner.  Performed at [facility], [place] [address], [place], [place] [address]   Urine Culture     Status: Abnormal   Collection Time: 01/18/22  1:47 AM   Specimen: Urine, Clean Catch  Result Value Ref Range Status   Specimen Description URINE, CLEAN CATCH  Final   Special Requests   Final    NONE Performed at [facility], [place] [address] [place], [place] [address]    Culture MULTIPLE SPECIES PRESENT, SUGGEST RECOLLECTION (A)  Final   Report Status 01/19/2022 FINAL  Final  Blood culture (routine x 2)     Status: None (Preliminary result)   Collection Time: 01/18/22  2:00 AM   Specimen: BLOOD  Result Value Ref Range Status   Specimen Description BLOOD SITE NOT SPECIFIED  Final   Special Requests   Final    BOTTLES DRAWN AEROBIC AND ANAEROBIC Blood Culture adequate volume   Culture   Final    NO GROWTH 2 DAYS Performed at [facility], [place] [address], [place], [place] [address]    Report Status PENDING  Incomplete  Blood culture (routine x 2)     Status: Abnormal (Preliminary result)   Collection Time: 01/18/22  4:30 AM   Specimen: BLOOD  Result Value Ref Range Status   Specimen Description BLOOD SITE NOT SPECIFIED  Final   Special Requests   Final    BOTTLES DRAWN AEROBIC AND ANAEROBIC Blood Culture adequate volume   Culture  Setup Time   Final    GRAM POSITIVE COCCI ANAEROBIC BOTTLE ONLY CRITICAL RESULT CALLED TO, READ BACK BY AND VERIFIED WITH: T RUDISILL,PHARMD'@0528'$  01/19/22    Culture (A)  Final    STAPHYLOCOCCUS HOMINIS THE SIGNIFICANCE OF ISOLATING THIS ORGANISM FROM A SINGLE SET OF BLOOD CULTURES WHEN MULTIPLE SETS ARE DRAWN IS UNCERTAIN. PLEASE NOTIFY THE MICROBIOLOGY DEPARTMENT WITHIN ONE WEEK IF SPECIATION AND SENSITIVITIES ARE REQUIRED. Performed at [facility], [place] [address] [place],  [address]    Report Status PENDING  Incomplete  Blood Culture ID Panel  (Reflexed)     Status: Abnormal   Collection Time: 01/18/22  4:30 AM  Result Value Ref Range Status   Enterococcus faecalis NOT DETECTED NOT DETECTED Final   Enterococcus Faecium NOT DETECTED NOT DETECTED Final   Listeria monocytogenes NOT DETECTED NOT DETECTED Final   Staphylococcus species DETECTED (A) NOT DETECTED Final    Comment: CRITICAL RESULT CALLED TO, READ BACK BY AND VERIFIED WITH: T RUDISILL,PHARMD'@0528'$  01/19/22 [place]    Staphylococcus aureus (BCID) NOT DETECTED NOT DETECTED Final  Staphylococcus epidermidis NOT DETECTED NOT DETECTED Final   Staphylococcus lugdunensis NOT DETECTED NOT DETECTED Final   Streptococcus species NOT DETECTED NOT DETECTED Final   Streptococcus agalactiae NOT DETECTED NOT DETECTED Final   Streptococcus pneumoniae NOT DETECTED NOT DETECTED Final   Streptococcus pyogenes NOT DETECTED NOT DETECTED Final   A.calcoaceticus-baumannii NOT DETECTED NOT DETECTED Final   Bacteroides fragilis NOT DETECTED NOT DETECTED Final   Enterobacterales NOT DETECTED NOT DETECTED Final   Enterobacter cloacae complex NOT DETECTED NOT DETECTED Final   Escherichia coli NOT DETECTED NOT DETECTED Final   Klebsiella aerogenes NOT DETECTED NOT DETECTED Final   Klebsiella oxytoca NOT DETECTED NOT DETECTED Final   Klebsiella pneumoniae NOT DETECTED NOT DETECTED Final   Proteus species NOT DETECTED NOT DETECTED Final   Salmonella species NOT DETECTED NOT DETECTED Final   Serratia marcescens NOT DETECTED NOT DETECTED Final   Haemophilus influenzae NOT DETECTED NOT DETECTED Final   Neisseria meningitidis NOT DETECTED NOT DETECTED Final   Pseudomonas aeruginosa NOT DETECTED NOT DETECTED Final   Stenotrophomonas maltophilia NOT DETECTED NOT DETECTED Final   Candida albicans NOT DETECTED NOT DETECTED Final   Candida auris NOT DETECTED NOT DETECTED Final   Candida glabrata NOT DETECTED NOT DETECTED Final   Candida krusei NOT DETECTED NOT DETECTED Final   Candida parapsilosis NOT  DETECTED NOT DETECTED Final   Candida tropicalis NOT DETECTED NOT DETECTED Final   Cryptococcus neoformans/gattii NOT DETECTED NOT DETECTED Final    Comment: Performed at [facility], [address], [place], [place] [address]  Respiratory (~20 pathogens) panel by PCR     Status: None   Collection Time: 01/18/22 11:33 AM   Specimen: Nasopharyngeal Swab; Respiratory  Result Value Ref Range Status   Adenovirus NOT DETECTED NOT DETECTED Final   Coronavirus 229E NOT DETECTED NOT DETECTED Final    Comment: (NOTE) The Coronavirus on the Respiratory Panel, DOES NOT test for the novel  Coronavirus (2019 nCoV)    Coronavirus HKU1 NOT DETECTED NOT DETECTED Final   Coronavirus NL63 NOT DETECTED NOT DETECTED Final   Coronavirus OC43 NOT DETECTED NOT DETECTED Final   Metapneumovirus NOT DETECTED NOT DETECTED Final   Rhinovirus / Enterovirus NOT DETECTED NOT DETECTED Final   Influenza A NOT DETECTED NOT DETECTED Final   Influenza B NOT DETECTED NOT DETECTED Final   Parainfluenza Virus 1 NOT DETECTED NOT DETECTED Final   Parainfluenza Virus 2 NOT DETECTED NOT DETECTED Final   Parainfluenza Virus 3 NOT DETECTED NOT DETECTED Final   Parainfluenza Virus 4 NOT DETECTED NOT DETECTED Final   Respiratory Syncytial Virus NOT DETECTED NOT DETECTED Final   Bordetella pertussis NOT DETECTED NOT DETECTED Final   Bordetella Parapertussis NOT DETECTED NOT DETECTED Final   Chlamydophila pneumoniae NOT DETECTED NOT DETECTED Final   Mycoplasma pneumoniae NOT DETECTED NOT DETECTED Final    Comment: Performed at [facility], [place]. [address] [place], [place] [address]  Group A Strep by PCR     Status: None   Collection Time: 01/18/22 11:33 AM  Result Value Ref Range Status   Group A Strep by PCR NOT DETECTED NOT DETECTED Final    Comment: Performed at [facility], [place] [address], [place], [place] [address]     Time coordinating discharge: 35 minutes  SIGNED:   Barb Merino, MD  [facility] 01/20/2022, 9:45 AM

## 2022-01-23 LAB — CULTURE, BLOOD (ROUTINE X 2)
Culture: NO GROWTH
Special Requests: ADEQUATE

## 2022-01-27 DIAGNOSIS — E1169 Type 2 diabetes mellitus with other specified complication: Secondary | ICD-10-CM | POA: Diagnosis not present

## 2022-01-27 DIAGNOSIS — Z8619 Personal history of other infectious and parasitic diseases: Secondary | ICD-10-CM | POA: Diagnosis not present

## 2022-01-27 DIAGNOSIS — E785 Hyperlipidemia, unspecified: Secondary | ICD-10-CM | POA: Diagnosis not present

## 2022-01-27 DIAGNOSIS — D696 Thrombocytopenia, unspecified: Secondary | ICD-10-CM | POA: Diagnosis not present

## 2022-01-27 DIAGNOSIS — F319 Bipolar disorder, unspecified: Secondary | ICD-10-CM | POA: Diagnosis not present

## 2022-01-27 DIAGNOSIS — E871 Hypo-osmolality and hyponatremia: Secondary | ICD-10-CM | POA: Diagnosis not present

## 2022-01-31 DIAGNOSIS — F3162 Bipolar disorder, current episode mixed, moderate: Secondary | ICD-10-CM | POA: Diagnosis not present

## 2022-01-31 DIAGNOSIS — F1010 Alcohol abuse, uncomplicated: Secondary | ICD-10-CM | POA: Diagnosis not present

## 2022-02-05 ENCOUNTER — Ambulatory Visit (INDEPENDENT_AMBULATORY_CARE_PROVIDER_SITE_OTHER): Payer: BC Managed Care – PPO | Admitting: Licensed Clinical Social Worker

## 2022-02-05 ENCOUNTER — Encounter (HOSPITAL_COMMUNITY): Payer: Self-pay

## 2022-02-05 DIAGNOSIS — F3132 Bipolar disorder, current episode depressed, moderate: Secondary | ICD-10-CM | POA: Diagnosis not present

## 2022-02-05 DIAGNOSIS — F4310 Post-traumatic stress disorder, unspecified: Secondary | ICD-10-CM
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
## 2022-02-05 NOTE — Plan of Care (Signed)
Developed treatment plan using pt input

## 2022-02-10 DIAGNOSIS — F3162 Bipolar disorder, current episode mixed, moderate: Secondary | ICD-10-CM | POA: Diagnosis not present

## 2022-02-10 DIAGNOSIS — F1010 Alcohol abuse, uncomplicated: Secondary | ICD-10-CM | POA: Diagnosis not present

## 2022-02-19 DIAGNOSIS — F1010 Alcohol abuse, uncomplicated: Secondary | ICD-10-CM | POA: Diagnosis not present

## 2022-02-19 DIAGNOSIS — F3162 Bipolar disorder, current episode mixed, moderate: Secondary | ICD-10-CM | POA: Diagnosis not present

## 2022-03-05 DIAGNOSIS — F1010 Alcohol abuse, uncomplicated: Secondary | ICD-10-CM | POA: Diagnosis not present

## 2022-03-05 DIAGNOSIS — F3162 Bipolar disorder, current episode mixed, moderate: Secondary | ICD-10-CM | POA: Diagnosis not present

## 2022-03-19 ENCOUNTER — Ambulatory Visit (INDEPENDENT_AMBULATORY_CARE_PROVIDER_SITE_OTHER): Payer: BC Managed Care – PPO | Admitting: Licensed Clinical Social Worker

## 2022-03-19 DIAGNOSIS — F4310 Post-traumatic stress disorder, unspecified: Secondary | ICD-10-CM

## 2022-03-19 DIAGNOSIS — F3132 Bipolar disorder, current episode depressed, moderate: Secondary | ICD-10-CM | POA: Diagnosis not present

## 2022-03-19 NOTE — Progress Notes (Signed)
Virtual Visit via Video Note  I connected with [name] on 03/19/22 at  8:00 AM EDT by a video enabled telemedicine application and verified that I am speaking with the correct person using two identifiers.  Location: Patient: home Provider: [place] Office   I discussed the limitations of evaluation and management by telemedicine and the availability of in person appointments. The patient expressed understanding and agreed to proceed.  I discussed the assessment and treatment plan with the patient. The patient was provided an opportunity to ask questions and all were answered. The patient agreed with the plan and demonstrated an understanding of the instructions.   The patient was advised to call back or seek an in-person evaluation if the symptoms worsen or if the condition fails to improve as anticipated.  I provided 30 minutes of non-face-to-face time during this encounter.   Ayva Veilleux R Reilley Latorre, LCSW   THERAPIST PROGRESS NOTE  Session Time: 8:10-8:40a  Participation Level: Active  Behavioral Response: NAAlertAnxious and Depressed  Type of Therapy: Individual Therapy  Treatment Goals addressed: Timmie reports that he recently had a medication change--increase dose on his Seroquel. Patient reports that he is feeling out of balance since the change. Patient reports good appetite, fair sleep quality and quantity, and limited energy throughout the day. Patient reports that he is feeling depressed, and feels limited focus and concentration. Patient reports that he has an appointment with his psychiatrist in two weeks. Encouraged patient to keep appointment and to write down all concerns that he has about medication change. Reviewed information with patient that medication changes often trigger a fluctuation of symptoms up to four to five weeks after the change. Patient reflects understanding.  Assisted patient in identifying activities that are  pleasurable and reduce stress throughout the day. Encouraged patient to include these activities into his daily schedule as frequently as possible.  Discussed overall health management--patient reports that he is doing a pretty good job of managing his chronic health conditions. Reviewed diabetes self management behaviors, eating behaviors/nutrition, and importance of physical activity (as much as he is able to do physically).    Continued recommendations are as follows: self care behaviors, positive social engagements, focusing on overall work/home/life balance, and focusing on positive physical and emotional wellness.   ProgressTowards Goals: Progressing  Interventions: CBT, Motivational Interviewing, and Solution Focused  Summary: SHARAD VANEATON is a 61 y.o. male who presents with continuing symptoms related to bipolar disorder (depression, anxiety) .   Suicidal/Homicidal: No  Therapist Response: Pt is continuing to apply interventions learned in session into daily life situations. Pt is currently on track to meet goals utilizing interventions mentioned above. Personal growth and progress noted. Treatment to continue as indicated.   Plan: Return again in 4-5 weeks.  Diagnosis:  Encounter Diagnoses  Name Primary?   PTSD (post-traumatic stress disorder) Yes   Bipolar affective disorder, currently depressed, moderate ([place])     Collaboration of Care: Other Pt encouraged to follow up with psychiatrist of record  Patient/Guardian was advised Release of Information must be obtained prior to any record release in order to collaborate their care with an outside provider. Patient/Guardian was advised if they have not already done so to contact the registration department to sign all necessary forms in order for [place] to release information regarding their care.   Consent: Patient/Guardian gives verbal consent for treatment and assignment of benefits for services provided during this visit.  Patient/Guardian expressed understanding and agreed to proceed.  [place], LCSW 03/19/2022

## 2022-04-01 ENCOUNTER — Encounter: Payer: Self-pay | Admitting: Cardiology

## 2022-04-01 ENCOUNTER — Ambulatory Visit (INDEPENDENT_AMBULATORY_CARE_PROVIDER_SITE_OTHER): Payer: BC Managed Care – PPO | Admitting: Cardiology

## 2022-04-01 VITALS — BP 138/64 | HR 76 | Ht 72.0 in | Wt 338.6 lb

## 2022-04-01 DIAGNOSIS — E1169 Type 2 diabetes mellitus with other specified complication: Secondary | ICD-10-CM

## 2022-04-01 DIAGNOSIS — I10 Essential (primary) hypertension: Secondary | ICD-10-CM | POA: Diagnosis not present

## 2022-04-01 DIAGNOSIS — E782 Mixed hyperlipidemia: Secondary | ICD-10-CM

## 2022-04-01 DIAGNOSIS — I700 Atherosclerosis of aorta: Secondary | ICD-10-CM

## 2022-04-01 DIAGNOSIS — I2510 Atherosclerotic heart disease of native coronary artery without angina pectoris: Secondary | ICD-10-CM

## 2022-04-01 DIAGNOSIS — E785 Hyperlipidemia, unspecified: Secondary | ICD-10-CM

## 2022-04-01 DIAGNOSIS — F17210 Nicotine dependence, cigarettes, uncomplicated: Secondary | ICD-10-CM

## 2022-04-01 DIAGNOSIS — E118 Type 2 diabetes mellitus with unspecified complications: Secondary | ICD-10-CM

## 2022-04-01 NOTE — Patient Instructions (Signed)

## 2022-04-01 NOTE — Progress Notes (Signed)
Cardiology Office Note:    Date:  04/01/2022   ID:  Rodney Leach, DOB Jan 05, 1961, MRN [id number]  PCP:  Leonides Sake, MD  Cardiologist:  Jenean Lindau, MD   Referring MD: Leonides Sake, MD    ASSESSMENT:    1. Aortic atherosclerosis (Rodney Leach)   2. Coronary artery disease involving native coronary artery of native heart without angina pectoris   3. Essential hypertension   4. Mixed hyperlipidemia   5. Type 2 diabetes mellitus with hyperlipidemia (Rodney Leach)   6. Cigarette smoker   7. Type 2 diabetes mellitus with complication, without long-term current use of insulin (Rodney Leach)    PLAN:    In order of problems listed above:  Nonobstructive coronary artery disease: Secondary prevention stressed with the patient.  Importance of compliance with diet medication stressed any vocalized understanding.  Coronary angiography report was discussed with him at length and questions were answered to his satisfaction.  Details are mentioned below.  He was advised to walk at least half an hour a day 5 days a week and he promises to do so. Essential hypertension: Blood pressure stable and diet was emphasized.  Lifestyle modification urged. Mixed dyslipidemia, diabetes mellitus and obesity: Weight reduction stressed diet was emphasized lifestyle modification urged and had an extensive discussion with him about diet and he promises to do better. Cigarette smoker: I spent 5 minutes with the patient discussing solely about smoking. Smoking cessation was counseled. I suggested to the patient also different medications and pharmacological interventions. Patient is keen to try stopping on its own at this time. He will get back to me if he needs any further assistance in this matter. Patient will be seen in follow-up appointment in 12 months or earlier if the patient has any concerns    Medication Adjustments/Labs and Tests Ordered: Current medicines are reviewed at length with the patient today.  Concerns  regarding medicines are outlined above.  No orders of the defined types were placed in this encounter.  No orders of the defined types were placed in this encounter.    No chief complaint on file.    History of Present Illness:    Rodney Leach is a 61 y.o. male.  Patient has past medical history of coronary artery disease by coronary angiography done recently.  This is mild and nonobstructive in nature, aortic atherosclerosis, essential hypertension, mixed dyslipidemia, diabetes mellitus obesity and smoking.  He denies any problems at this time and takes care of activities of daily living.  No chest pain orthopnea or PND.  Unfortunately his continues to smoke but trying to cut down.  Past Medical History:  Diagnosis Date   Adenomatous colon polyp    ADHD, adult residual type    Anxiety 09/03/2012   Adequate for discharge   Aortic atherosclerosis (Rodney Leach)    Arthritis    Asthma    Atypical chest pain    Bipolar affective (Rodney Leach)    Bipolar depression ([name])    Bipolar II disorder (Rodney Leach)    CAD (coronary artery disease)    Carpal tunnel syndrome of right wrist 10/21/2017   Cervical disc displacement 03/24/2017   Formatting of this note might be different from the original. CERVICAL 5-6, CERVICAL 6-7   Chronic pain syndrome    Complication of anesthesia    has woken up during surgery before    Depression 09/03/2012   Adequate for discharge   Diabetes mellitus without complication (Rodney Leach)    Emphysema/COPD (Rodney Leach)  Essential hypertension 10/09/2021   Blood pressure is picking up.  Hypotensive on arrival.  Resume amlodipine today.  May not need to multiple antihypertensives.   GERD (gastroesophageal reflux disease)    Hepatic steatosis    Hypomagnesemia 10/09/2021   Replace aggressively today.  Recheck levels tomorrow including phosphorus and potassium.   Hypothyroidism    Impotence, organic    Irregular heart beat    Lumbar radiculopathy, chronic    Mixed hyperlipidemia    MRSA  (methicillin resistant staph aureus) culture positive 08/08/2016   Formatting of this note might be different from the original. UPDATE : MRSA RE-SWAB FOR NEXT SURGERY ALSO +MRSA ON 03-24-17  + MRSA NASAL SWAB ON 08-08-16   Nodule of lower lobe of right lung    Obstructive sleep apnea 10/09/2021   Start using CPAP at night.  Wean off oxygen.   Pneumonia    Postlaminectomy syndrome of lumbosacral region 08/08/2016   PTSD (post-traumatic stress disorder)    Recurrent major depressive disorder (Rodney Leach) 01/30/2018   Sciatica 08/08/2016   Shock ([place]) 10/07/2021   Slurred speech 01/14/2013   Spinal stenosis 08/08/2016   Stroke ([place])    "light" stroke   Type 2 diabetes mellitus with complication, without long-term current use of insulin (Rodney Leach) 10/09/2021   On metformin at home.  Fairly controlled as per patient.  Holding metformin.  Currently on sliding scale insulin.  Resume metformin on discharge.   Type 2 diabetes mellitus with diabetic neuropathy (Rodney Leach)    Type 2 diabetes mellitus with hyperlipidemia [facility])     Past Surgical History:  Procedure Laterality Date   BACK SURGERY     fusion L5/S1   COLONOSCOPY WITH PROPOFOL N/A 09/03/2017   Procedure: COLONOSCOPY WITH PROPOFOL;  Surgeon: Laurence Spates, MD;  Location: [facility] ENDOSCOPY;  Service: Endoscopy;  Laterality: N/A;   HAND SURGERY     R & L    HERNIA REPAIR     umbilical   LEFT HEART CATH AND CORONARY ANGIOGRAPHY N/A 01/02/2022   Procedure: LEFT HEART CATH AND CORONARY ANGIOGRAPHY;  Surgeon: Martinique, Peter M, MD;  Location: [place] [facility] LAB;  Service: Cardiovascular;  Laterality: N/A;   MULTIPLE TOOTH EXTRACTIONS     NOSE SURGERY     fracture repair   TOTAL KNEE ARTHROPLASTY  2012   Right   TOTAL KNEE ARTHROPLASTY  04/07/2012   Procedure: TOTAL KNEE ARTHROPLASTY;  Surgeon: Ninetta Lights, MD;  Location: [place];  Service: Orthopedics;  Laterality: Left;  left total knee arthroplasty    Current Medications: Current Meds  Medication Sig    albuterol (VENTOLIN HFA) 108 (90 Base) MCG/ACT inhaler Inhale 2 puffs into the lungs every 6 (six) hours as needed for shortness of breath.   amLODipine (NORVASC) 10 MG tablet Take 1 tablet (10 mg total) by mouth daily.   aspirin EC 81 MG tablet Take 81 mg by mouth daily. Swallow whole.   divalproex (DEPAKOTE) 500 MG DR tablet Take 2 tablets (1,000 mg total) by mouth 2 (two) times daily.   famotidine (PEPCID) 40 MG tablet Take 1 tablet (40 mg total) by mouth daily.   folic acid (FOLVITE) 1 MG tablet Take 1 tablet (1 mg total) by mouth at bedtime.   gabapentin (NEURONTIN) 400 MG capsule Take 1 capsule (400 mg total) by mouth 3 (three) times daily.   insulin glargine (LANTUS) 100 UNIT/ML Solostar Pen Inject 20 Units into the skin daily. (Patient taking differently: Inject 42 Units into the skin daily.)  Insulin Pen Needle (ASSURE ID SAFETY PEN NEEDLES) 31G X 5 MM MISC 1 each by Does not apply route daily.   levothyroxine (SYNTHROID) 175 MCG tablet Take 1 tablet (175 mcg total) by mouth daily at 6 (six) AM.   losartan (COZAAR) 100 MG tablet Take 1 tablet (100 mg total) by mouth daily.   magnesium oxide (MAG-OX) 400 MG tablet Take 400 mg by mouth at bedtime.   metFORMIN (GLUCOPHAGE) 1000 MG tablet Take 1 tablet (1,000 mg total) by mouth 2 (two) times daily.   metoprolol succinate (TOPROL-XL) 25 MG 24 hr tablet Take 1 tablet (25 mg total) by mouth daily.   nitroGLYCERIN (NITROSTAT) 0.4 MG SL tablet Place 1 tablet (0.4 mg total) under the tongue every 5 (five) minutes as needed for chest pain.   NON FORMULARY CPAP at bedtime   PARoxetine (PAXIL) 20 MG tablet Take 1 tablet (20 mg total) by mouth daily.   QUEtiapine (SEROQUEL) 400 MG tablet Take 2 tablets (800 mg total) by mouth at bedtime.   temazepam (RESTORIL) 15 MG capsule Take 1 capsule (15 mg total) by mouth at bedtime.   VRAYLAR 1.5 MG capsule Take 1.5 mg by mouth daily.     Allergies:   Carbamazepine, Adhesive [tape], and Sulfa antibiotics    Social History   Socioeconomic History   Marital status: Married    Spouse name: Not on file   Number of children: Not on file   Years of education: Not on file   Highest education level: Not on file  Occupational History   Not on file  Tobacco Use   Smoking status: Every Day    Packs/day: 0.75    Types: Cigarettes   Smokeless tobacco: Former    Types: Nurse, children's Use: Never used  Substance and Sexual Activity   Alcohol use: Yes    Comment: OCCASIONALLY   Drug use: No   Sexual activity: Yes  Other Topics Concern   Not on file  Social History Narrative   Not on file   Social Determinants of Health   Financial Resource Strain: Not on file  Food Insecurity: Not on file  Transportation Needs: Not on file  Physical Activity: Not on file  Stress: Not on file  Social Connections: Not on file     Family History: The patient's family history includes Diabetes in his mother; Hypertension in his father and mother.  ROS:   Please see the history of present illness.    All other systems reviewed and are negative.  EKGs/Labs/Other Studies Reviewed:    The following studies were reviewed today:    Recent Labs: 01/18/2022: ALT 18; B Natriuretic Peptide 109.6; TSH 3.362 01/20/2022: BUN 17; Creatinine, Ser 0.92; Hemoglobin 14.6; Magnesium 1.7; Platelets 137; Potassium 3.8; Sodium 132  Recent Lipid Panel    Component Value Date/Time   CHOL 166 12/18/2021 0932   TRIG 146 12/18/2021 0932   HDL 38 (L) 12/18/2021 0932   CHOLHDL 4.4 12/18/2021 0932   VLDL 29 12/18/2021 0932   LDLCALC 99 12/18/2021 0932    Physical Exam:    VS:  BP 138/64 (BP Location: Right Arm, Patient Position: Sitting, Cuff Size: Large)   Pulse 76   Ht 6' (1.829 m)   Wt (!) 338 lb 9.6 oz (153.6 kg)   SpO2 99%   BMI 45.92 kg/m     Wt Readings from Last 3 Encounters:  04/01/22 (!) 338 lb 9.6 oz (153.6 kg)  01/20/22 Marland Kitchen)  331 lb 9.2 oz (150.4 kg)  01/02/22 (!) 343 lb (155.6 kg)      GEN: Patient is in no acute distress HEENT: Normal NECK: No JVD; No carotid bruits LYMPHATICS: No lymphadenopathy CARDIAC: Hear sounds regular, 2/6 systolic murmur at the apex. RESPIRATORY:  Clear to auscultation without rales, wheezing or rhonchi  ABDOMEN: Soft, non-tender, non-distended MUSCULOSKELETAL:  No edema; No deformity  SKIN: Warm and dry NEUROLOGIC:  Alert and oriented x 3 PSYCHIATRIC:  Normal affect   Signed, Jenean Lindau, MD  04/01/2022 1:08 PM    [facility]

## 2022-04-02 DIAGNOSIS — F3162 Bipolar disorder, current episode mixed, moderate: Secondary | ICD-10-CM | POA: Diagnosis not present

## 2022-04-02 DIAGNOSIS — F1010 Alcohol abuse, uncomplicated: Secondary | ICD-10-CM | POA: Diagnosis not present

## 2022-04-04 ENCOUNTER — Ambulatory Visit (INDEPENDENT_AMBULATORY_CARE_PROVIDER_SITE_OTHER): Payer: BC Managed Care – PPO | Admitting: Podiatry

## 2022-04-04 DIAGNOSIS — M79674 Pain in right toe(s): Secondary | ICD-10-CM | POA: Diagnosis not present

## 2022-04-04 DIAGNOSIS — B351 Tinea unguium: Secondary | ICD-10-CM

## 2022-04-04 DIAGNOSIS — M79675 Pain in left toe(s): Secondary | ICD-10-CM

## 2022-04-04 NOTE — Progress Notes (Signed)
Chief Complaint  Patient presents with   foot care    Patient is here for routine foot care.    SUBJECTIVE Patient with a history of diabetes mellitus presents to office today complaining of elongated, thickened nails that cause pain while ambulating in shoes.  Patient is unable to trim their own nails. Patient is here for further evaluation and treatment.   Past Medical History:  Diagnosis Date   Adenomatous colon polyp    ADHD, adult residual type    Anxiety 09/03/2012   Adequate for discharge   Aortic atherosclerosis ([name])    Arthritis    Asthma    Atypical chest pain    Bipolar affective ([place])    Bipolar depression ([place])    Bipolar II disorder ([name])    CAD (coronary artery disease)    Carpal tunnel syndrome of right wrist 10/21/2017   Cervical disc displacement 03/24/2017   Formatting of this note might be different from the original. CERVICAL 5-6, CERVICAL 6-7   Chronic pain syndrome    Complication of anesthesia    has woken up during surgery before    Depression 09/03/2012   Adequate for discharge   Diabetes mellitus without complication ([place])    Emphysema/COPD ([place])    Essential hypertension 10/09/2021   Blood pressure is picking up.  Hypotensive on arrival.  Resume amlodipine today.  May not need to multiple antihypertensives.   GERD (gastroesophageal reflux disease)    Hepatic steatosis    Hypomagnesemia 10/09/2021   Replace aggressively today.  Recheck levels tomorrow including phosphorus and potassium.   Hypothyroidism    Impotence, organic    Irregular heart beat    Lumbar radiculopathy, chronic    Mixed hyperlipidemia    MRSA (methicillin resistant staph aureus) culture positive 08/08/2016   Formatting of this note might be different from the original. UPDATE : MRSA RE-SWAB FOR NEXT SURGERY ALSO +MRSA ON 03-24-17  + MRSA NASAL SWAB ON 08-08-16   Nodule of lower lobe of right lung    Obstructive sleep apnea 10/09/2021   Start using CPAP at night.  Wean off  oxygen.   Pneumonia    Postlaminectomy syndrome of lumbosacral region 08/08/2016   PTSD (post-traumatic stress disorder)    Recurrent major depressive disorder ([place]) 01/30/2018   Sciatica 08/08/2016   Shock ([place]) 10/07/2021   Slurred speech 01/14/2013   Spinal stenosis 08/08/2016   Stroke ([place])    "light" stroke   Type 2 diabetes mellitus with complication, without long-term current use of insulin ([place]) 10/09/2021   On metformin at home.  Fairly controlled as per patient.  Holding metformin.  Currently on sliding scale insulin.  Resume metformin on discharge.   Type 2 diabetes mellitus with diabetic neuropathy ([name])    Type 2 diabetes mellitus with hyperlipidemia ([place])     OBJECTIVE General Patient is awake, alert, and oriented x 3 and in no acute distress. Derm Skin is dry and supple bilateral. Negative open lesions or macerations. Remaining integument unremarkable. Nails are tender, long, thickened and dystrophic with subungual debris, consistent with onychomycosis, 1-5 bilateral. No signs of infection noted. Vasc  DP and PT pedal pulses palpable bilaterally. Temperature gradient within normal limits.  Neuro Epicritic and protective threshold sensation diminished bilaterally.  Musculoskeletal Exam No symptomatic pedal deformities noted bilateral. Muscular strength within normal limits.  ASSESSMENT 1. Diabetes Mellitus w/ peripheral neuropathy 2.  Pain due to onychomycosis of toenails bilateral  PLAN OF CARE 1. Patient evaluated today. 2. Instructed  to maintain good pedal hygiene and foot care. Stressed importance of controlling blood sugar.  3. Mechanical debridement of nails 1-5 bilaterally performed using a nail nipper. Filed with dremel without incident.  4. Return to clinic in 3 mos.     Edrick Kins, DPM [facility]  Dr. Edrick Kins, DPM    [address], [place] [address]                Office [phone]   Fax [phone]

## 2022-04-24 DIAGNOSIS — E039 Hypothyroidism, unspecified: Secondary | ICD-10-CM | POA: Diagnosis not present

## 2022-04-24 DIAGNOSIS — Z23 Encounter for immunization: Secondary | ICD-10-CM | POA: Diagnosis not present

## 2022-04-24 DIAGNOSIS — I10 Essential (primary) hypertension: Secondary | ICD-10-CM | POA: Diagnosis not present

## 2022-04-24 DIAGNOSIS — E782 Mixed hyperlipidemia: Secondary | ICD-10-CM | POA: Diagnosis not present

## 2022-04-24 DIAGNOSIS — E1140 Type 2 diabetes mellitus with diabetic neuropathy, unspecified: Secondary | ICD-10-CM | POA: Diagnosis not present

## 2022-04-24 DIAGNOSIS — I700 Atherosclerosis of aorta: Secondary | ICD-10-CM | POA: Diagnosis not present

## 2022-04-29 ENCOUNTER — Ambulatory Visit (INDEPENDENT_AMBULATORY_CARE_PROVIDER_SITE_OTHER): Payer: BC Managed Care – PPO | Admitting: Licensed Clinical Social Worker

## 2022-04-29 DIAGNOSIS — F4310 Post-traumatic stress disorder, unspecified: Secondary | ICD-10-CM | POA: Diagnosis not present

## 2022-04-29 DIAGNOSIS — F3132 Bipolar disorder, current episode depressed, moderate: Secondary | ICD-10-CM

## 2022-04-29 NOTE — Progress Notes (Signed)
Virtual Visit via Video Note  I connected with [name] on 04/29/22 at  8:00 AM EDT by a video enabled telemedicine application and verified that I am speaking with the correct person using two identifiers.  Location: Patient: home Provider: [place] Office   I discussed the limitations of evaluation and management by telemedicine and the availability of in person appointments. The patient expressed understanding and agreed to proceed.  I discussed the assessment and treatment plan with the patient. The patient was provided an opportunity to ask questions and all were answered. The patient agreed with the plan and demonstrated an understanding of the instructions.   The patient was advised to call back or seek an in-person evaluation if the symptoms worsen or if the condition fails to improve as anticipated.  I provided 30 minutes of non-face-to-face time during this encounter.   [place], LCSW   THERAPIST PROGRESS NOTE  Session Time: [phone]  *pt requested to end session early since he was traveling in his personal vehicle and was going to lose service  Participation Level: Active  Behavioral Response: NAAlertAnxious and Depressed  Type of Therapy: Individual Therapy  Treatment Goals addressed: Problem: PTSD-Trauma Disorder Goal: Reduce the negative impact trauma related symptoms have on social, occupational, and family functioning per pt self report 3 out of 5 sessions documented.  Outcome: Progressing Goal: Reduction in intrusive event recollections, avoidance of event reminders, intense arousal, or disinterest in activities or relationships 3 out of 5 sessions documented Outcome: Progressing Goal: Decrease depressive symptoms and improve levels of effective functioning-pt reports a decrease in overall depression symptoms 3 out of 5 sessions documented.  Outcome: Progressing Goal: Reduce overall frequency, intensity, and  duration of the anxiety so that daily functioning is not impaired per pt self report 3 out of 5 sessions documented.   Outcome: Progressing Intervention: REVIEW PLEASE SKILLS (TREAT PHYSICAL ILLNESS, BALANCE EATING, AVOID MOOD-ALTERING SUBSTANCES, BALANCE SLEEP AND GET EXERCISE) WITH Mateo Flow Note: Reviewed  Intervention: Encourage patient to identify triggers Note: Assisted with identification Intervention: Discuss self-management skills Note: Reviewed Intervention: Encourage self-care activities Note: Encouraged     ProgressTowards Goals: Progressing  Interventions: CBT, Motivational Interviewing, and Solution Focused  Summary: ALCIDE MEMOLI is a 61 y.o. male who presents with continuing symptoms related to bipolar disorder (depression, anxiety) . Pt reports that he is compliant with his medication and feels that his symptoms are improving. Pt reports improved sleep quality and quantity: pt is getting around 5 consecutive hours of sleep per night.   Allowed pt to explore and express thoughts and feelings associated with recent life situations and external stressors. 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 Allow patient to explore his thoughts, and why he feels this way. Patient reports that he really does not have any evidence to support the thought, but he is assuming because she said that she would call him back over the weekend and she did not that that means that she's decided not to work with him for whatever reason--and patient makes the assumption that it's his background that is the reason. Explored this thought with patient and allowed him to recognize that there was no evidence to support this thought, and that more than likely there were other reasons that he did not get the call, including reasons that have nothing to do  with him.  Encouraged patient to be assertive with his communication and to reach out to her to question further work.  Continued recommendations are as follows: self care behaviors, positive social engagements, focusing on overall work/home/life balance, and focusing on positive physical and emotional wellness.    Suicidal/Homicidal: No  Therapist Response: Pt is continuing to apply interventions learned in session into daily life situations. Pt is currently on track to meet goals utilizing interventions mentioned above. Personal growth and progress noted. Treatment to continue as indicated.   Plan: Return again in 4-5 weeks.  Diagnosis:  Encounter Diagnoses  Name Primary?   PTSD (post-traumatic stress disorder) Yes   Bipolar affective disorder, currently depressed, moderate ([place])     Collaboration of Care: Other Pt encouraged to follow up with psychiatrist of record  Patient/Guardian was advised Release of Information must be obtained prior to any record release in order to collaborate their care with an outside provider. Patient/Guardian was advised if they have not already done so to contact the registration department to sign all necessary forms in order for [place] to release information regarding their care.   Consent: Patient/Guardian gives verbal consent for treatment and assignment of benefits for services provided during this visit. Patient/Guardian expressed understanding and agreed to proceed.   [place], LCSW 04/29/2022

## 2022-04-29 NOTE — Plan of Care (Signed)
  Problem: PTSD-Trauma Disorder Goal: Reduce the negative impact trauma related symptoms have on social, occupational, and family functioning per pt self report 3 out of 5 sessions documented.  Outcome: Progressing Goal: Reduction in intrusive event recollections, avoidance of event reminders, intense arousal, or disinterest in activities or relationships 3 out of 5 sessions documented Outcome: Progressing Goal: Decrease depressive symptoms and improve levels of effective functioning-pt reports a decrease in overall depression symptoms 3 out of 5 sessions documented.  Outcome: Progressing Goal: Reduce overall frequency, intensity, and duration of the anxiety so that daily functioning is not impaired per pt self report 3 out of 5 sessions documented.   Outcome: Progressing Intervention: REVIEW PLEASE SKILLS (TREAT PHYSICAL ILLNESS, BALANCE EATING, AVOID MOOD-ALTERING SUBSTANCES, BALANCE SLEEP AND GET EXERCISE) WITH Rodney Leach Note: Reviewed  Intervention: Encourage patient to identify triggers Note: Assisted with identification Intervention: Discuss self-management skills Note: Reviewed Intervention: Encourage self-care activities Note: Encouraged

## 2022-04-30 DIAGNOSIS — F3162 Bipolar disorder, current episode mixed, moderate: Secondary | ICD-10-CM | POA: Diagnosis not present

## 2022-04-30 DIAGNOSIS — F1010 Alcohol abuse, uncomplicated: Secondary | ICD-10-CM | POA: Diagnosis not present

## 2022-05-12 DIAGNOSIS — E119 Type 2 diabetes mellitus without complications: Secondary | ICD-10-CM | POA: Diagnosis not present

## 2022-05-21 ENCOUNTER — Telehealth: Payer: Self-pay

## 2022-05-21 NOTE — Patient Outreach (Signed)
  Care Coordination   Initial Visit Note   05/21/2022 Name: Rodney Leach MRN: [id number] DOB: Jul 01, 1961  Rodney Leach is a 61 y.o. year old male who sees Hamrick, Lorin Mercy, MD for primary care. I spoke with  Rodney Leach by phone today.  What matters to the patients health and wellness today?  Placed call to patient today to review and offer [facility] care coordination program.  Patient reports concern about need for a Chest CT. Otherwise no other concerns.    Goals Addressed               This Visit's Progress     Healh maintenance (pt-stated)        Care Coordination Interventions: Patient reports concern for needing a follow up Chest CT.  I reviewed last CT chest was 10/07/2021 and patient had normal readings. I explained this to patient and he reports that he did not know that he had a CT.   Reviewed with patient that he has not had his annual wellness exam . Encouraged patient to call MD office to schedule AWV.         SDOH assessments and interventions completed:  No    Care Coordination Interventions Activated:  Yes  Care Coordination Interventions:  No, not indicated   Follow up plan: No further intervention required.   Encounter Outcome:  Pt. Visit Completed   Tomasa Rand, RN, BSN, CEN [place] Coordinator [phone]

## 2022-06-02 DIAGNOSIS — J3489 Other specified disorders of nose and nasal sinuses: Secondary | ICD-10-CM | POA: Diagnosis not present

## 2022-06-09 ENCOUNTER — Ambulatory Visit (INDEPENDENT_AMBULATORY_CARE_PROVIDER_SITE_OTHER): Payer: BC Managed Care – PPO | Admitting: Licensed Clinical Social Worker

## 2022-06-09 ENCOUNTER — Telehealth (HOSPITAL_COMMUNITY): Payer: Self-pay | Admitting: Licensed Clinical Social Worker

## 2022-06-09 DIAGNOSIS — Z634 Disappearance and death of family member: Secondary | ICD-10-CM | POA: Diagnosis not present

## 2022-06-09 DIAGNOSIS — F4310 Post-traumatic stress disorder, unspecified: Secondary | ICD-10-CM

## 2022-06-09 NOTE — Progress Notes (Addendum)
Virtual Visit via Video Note  I connected with [name] on 06/09/22 at  8:00 AM EDT by a video enabled telemedicine application and verified that I am speaking with the correct person using two identifiers.  Location: Patient: home Provider: [place] Office   I discussed the limitations of evaluation and management by telemedicine and the availability of in person appointments. The patient expressed understanding and agreed to proceed.  I discussed the assessment and treatment plan with the patient. The patient was provided an opportunity to ask questions and all were answered. The patient agreed with the plan and demonstrated an understanding of the instructions.   The patient was advised to call back or seek an in-person evaluation if the symptoms worsen or if the condition fails to improve as anticipated.  I provided 30 minutes of non-face-to-face time during this encounter.   Shaquaya Wuellner R Brantlee Penn, LCSW   THERAPIST PROGRESS NOTE  Session Time: 8:15-8:45a  *pt requested to end session early   Participation Level: Active  Behavioral Response: NAAlertAnxious and Depressed  Type of Therapy: Individual Therapy  Treatment Goals addressed: PProblem: PTSD-Trauma Disorder Goal: Reduce the negative impact trauma related symptoms have on social, occupational, and family functioning per pt self report 3 out of 5 sessions documented.  Outcome: Not Progressing Goal: Reduction in intrusive event recollections, avoidance of event reminders, intense arousal, or disinterest in activities or relationships 3 out of 5 sessions documented Outcome: Not Progressing Goal: Decrease depressive symptoms and improve levels of effective functioning-pt reports a decrease in overall depression symptoms 3 out of 5 sessions documented.  Outcome: Not Progressing Goal: Reduce overall frequency, intensity, and duration of the anxiety so that daily functioning is not impaired  per pt self report 3 out of 5 sessions documented.   Outcome: Progressing Intervention: REVIEW PLEASE SKILLS (TREAT PHYSICAL ILLNESS, BALANCE EATING, AVOID MOOD-ALTERING SUBSTANCES, BALANCE SLEEP AND GET EXERCISE) WITH Mateo Flow Note: Reviewed  Intervention: Encourage patient to identify triggers Note: Allowed/assisted w/ identification and reviewed coping skills   ProgressTowards Goals: Progressing  Interventions: CBT, Motivational Interviewing, and Solution Focused  Summary: XAN INGRAHAM is a 61 y.o. male who presents with continuing symptoms related to bipolar disorder (depression, anxiety) . Pt reports that his mind has been racing more at night and that his appetite has been more decreased. Pt reports that he will schedule appt w/ psychiatrist soon.  Explored current grief--explored pts thoughts/feelings/experience w/ death of father when he was younger and delayed grief he experienced. Pt feels this is the same with his brother and feels that he may have delayed onset of grief re: brother.   Continued recommendations are as follows: self care behaviors, positive social engagements, focusing on overall work/home/life balance, and focusing on positive physical and emotional wellness.    Suicidal/Homicidal: No  Therapist Response: Pt is continuing to apply interventions learned in session into daily life situations. Pt is currently on track to meet goals utilizing interventions mentioned above. Personal growth and progress noted. Treatment to continue as indicated.   Plan: Return again in 4-5 weeks.  Diagnosis:  Encounter Diagnoses  Name Primary?   PTSD (post-traumatic stress disorder) Yes   Bereavement     Collaboration of Care: Other Pt encouraged to follow up with psychiatrist of record--[facility] in [place] was advised Release of Information must be obtained prior to any record release in order to collaborate their care with an outside provider.  Patient/Guardian was advised if they have not already done  so to contact the registration department to sign all necessary forms in order for [place] to release information regarding their care.   Consent: Patient/Guardian gives verbal consent for treatment and assignment of benefits for services provided during this visit. Patient/Guardian expressed understanding and agreed to proceed.   [place], LCSW 06/09/2022

## 2022-06-09 NOTE — Addendum Note (Signed)
Addended by: Granville Lewis on: 06/09/2022 09:07 AM   Modules accepted: Level of Service

## 2022-06-09 NOTE — Plan of Care (Signed)
  Problem: PTSD-Trauma Disorder Goal: Reduce the negative impact trauma related symptoms have on social, occupational, and family functioning per pt self report 3 out of 5 sessions documented.  Outcome: Not Progressing Goal: Reduction in intrusive event recollections, avoidance of event reminders, intense arousal, or disinterest in activities or relationships 3 out of 5 sessions documented Outcome: Not Progressing Goal: Decrease depressive symptoms and improve levels of effective functioning-pt reports a decrease in overall depression symptoms 3 out of 5 sessions documented.  Outcome: Not Progressing Goal: Reduce overall frequency, intensity, and duration of the anxiety so that daily functioning is not impaired per pt self report 3 out of 5 sessions documented.   Outcome: Progressing Intervention: REVIEW PLEASE SKILLS (TREAT PHYSICAL ILLNESS, BALANCE EATING, AVOID MOOD-ALTERING SUBSTANCES, BALANCE SLEEP AND GET EXERCISE) WITH Rodney Leach Note: Reviewed  Intervention: Encourage patient to identify triggers Note: Allowed/assisted w/ identification and reviewed coping skills

## 2022-06-09 NOTE — Telephone Encounter (Signed)
LCSW counselor attempted to connect with patient for scheduled appointment via MyChart video text request x 2 and email request with no response; also attempted to connect via phone without success. LCSW counselor left message for patient to call office number to reschedule OPT appointment.   Attempt 1: Text and email: 8:04a  Attempt 2: Text and email:8:10a  Attempt 3: phone call: 8:15a LMVM  Per [place], after multiple attempts to reach pt unsuccessfully at appointed time--visit will be coded as no show

## 2022-06-16 DIAGNOSIS — F1010 Alcohol abuse, uncomplicated: Secondary | ICD-10-CM | POA: Diagnosis not present

## 2022-06-16 DIAGNOSIS — F3162 Bipolar disorder, current episode mixed, moderate: Secondary | ICD-10-CM | POA: Diagnosis not present

## 2022-07-08 ENCOUNTER — Ambulatory Visit: Payer: BC Managed Care – PPO | Admitting: Podiatry

## 2022-07-16 ENCOUNTER — Ambulatory Visit (HOSPITAL_COMMUNITY): Payer: BC Managed Care – PPO | Admitting: Licensed Clinical Social Worker

## 2022-07-21 DIAGNOSIS — F419 Anxiety disorder, unspecified: Secondary | ICD-10-CM | POA: Diagnosis not present

## 2022-07-21 DIAGNOSIS — F1010 Alcohol abuse, uncomplicated: Secondary | ICD-10-CM | POA: Diagnosis not present

## 2022-07-21 DIAGNOSIS — F3162 Bipolar disorder, current episode mixed, moderate: Secondary | ICD-10-CM | POA: Diagnosis not present

## 2022-07-29 ENCOUNTER — Ambulatory Visit: Payer: BC Managed Care – PPO | Admitting: Podiatry

## 2022-08-26 ENCOUNTER — Ambulatory Visit (INDEPENDENT_AMBULATORY_CARE_PROVIDER_SITE_OTHER): Payer: BC Managed Care – PPO | Admitting: Podiatry

## 2022-08-26 ENCOUNTER — Ambulatory Visit (HOSPITAL_COMMUNITY): Payer: BC Managed Care – PPO | Admitting: Licensed Clinical Social Worker

## 2022-08-26 DIAGNOSIS — M79675 Pain in left toe(s): Secondary | ICD-10-CM | POA: Diagnosis not present

## 2022-08-26 DIAGNOSIS — M79674 Pain in right toe(s): Secondary | ICD-10-CM

## 2022-08-26 DIAGNOSIS — B351 Tinea unguium: Secondary | ICD-10-CM

## 2022-08-26 NOTE — Progress Notes (Signed)
Chief Complaint  Patient presents with   Diabetes    Diabetic foot care, A1c- 9.2Bg- not taking, nail trim     SUBJECTIVE Patient with a history of diabetes mellitus presents to office today complaining of elongated, thickened nails that cause pain while ambulating in shoes.  Patient is unable to trim their own nails. Patient is here for further evaluation and treatment.   Past Medical History:  Diagnosis Date   Adenomatous colon polyp    ADHD, adult residual type    Anxiety 09/03/2012   Adequate for discharge   Aortic atherosclerosis ([name])    Arthritis    Asthma    Atypical chest pain    Bipolar affective ([place])    Bipolar depression ([place])    Bipolar II disorder ([name])    CAD (coronary artery disease)    Carpal tunnel syndrome of right wrist 10/21/2017   Cervical disc displacement 03/24/2017   Formatting of this note might be different from the original. CERVICAL 5-6, CERVICAL 6-7   Chronic pain syndrome    Complication of anesthesia    has woken up during surgery before    Depression 09/03/2012   Adequate for discharge   Diabetes mellitus without complication ([place])    Emphysema/COPD ([place])    Essential hypertension 10/09/2021   Blood pressure is picking up.  Hypotensive on arrival.  Resume amlodipine today.  May not need to multiple antihypertensives.   GERD (gastroesophageal reflux disease)    Hepatic steatosis    Hypomagnesemia 10/09/2021   Replace aggressively today.  Recheck levels tomorrow including phosphorus and potassium.   Hypothyroidism    Impotence, organic    Irregular heart beat    Lumbar radiculopathy, chronic    Mixed hyperlipidemia    MRSA (methicillin resistant staph aureus) culture positive 08/08/2016   Formatting of this note might be different from the original. UPDATE : MRSA RE-SWAB FOR NEXT SURGERY ALSO +MRSA ON 03-24-17  + MRSA NASAL SWAB ON 08-08-16   Nodule of lower lobe of right lung    Obstructive sleep apnea 10/09/2021   Start using CPAP at night.   Wean off oxygen.   Pneumonia    Postlaminectomy syndrome of lumbosacral region 08/08/2016   PTSD (post-traumatic stress disorder)    Recurrent major depressive disorder ([place]) 01/30/2018   Sciatica 08/08/2016   Shock ([place]) 10/07/2021   Slurred speech 01/14/2013   Spinal stenosis 08/08/2016   Stroke ([place])    "light" stroke   Type 2 diabetes mellitus with complication, without long-term current use of insulin ([place]) 10/09/2021   On metformin at home.  Fairly controlled as per patient.  Holding metformin.  Currently on sliding scale insulin.  Resume metformin on discharge.   Type 2 diabetes mellitus with diabetic neuropathy ([name])    Type 2 diabetes mellitus with hyperlipidemia ([place])     OBJECTIVE General Patient is awake, alert, and oriented x 3 and in no acute distress. Derm Skin is dry and supple bilateral. Negative open lesions or macerations. Remaining integument unremarkable. Nails are tender, long, thickened and dystrophic with subungual debris, consistent with onychomycosis, 1-5 bilateral. No signs of infection noted. Vasc  DP and PT pedal pulses palpable bilaterally. Temperature gradient within normal limits.  Neuro Epicritic and protective threshold sensation diminished bilaterally.  Musculoskeletal Exam No symptomatic pedal deformities noted bilateral. Muscular strength within normal limits.  ASSESSMENT 1. Diabetes Mellitus w/ peripheral neuropathy 2.  Pain due to onychomycosis of toenails bilateral  PLAN OF CARE 1. Patient evaluated today.  2. Instructed to maintain good pedal hygiene and foot care. Stressed importance of controlling blood sugar.  3. Mechanical debridement of nails 1-5 bilaterally performed using a nail nipper. Filed with dremel without incident.  4. Return to clinic in 3 mos.     Edrick Kins, DPM [facility]  Dr. Edrick Kins, DPM    [address], [place] [address]                Office [phone]  Fax [phone]

## 2022-08-27 DIAGNOSIS — Z79899 Other long term (current) drug therapy: Secondary | ICD-10-CM | POA: Diagnosis not present

## 2022-08-27 DIAGNOSIS — Z125 Encounter for screening for malignant neoplasm of prostate: Secondary | ICD-10-CM | POA: Diagnosis not present

## 2022-08-27 DIAGNOSIS — E039 Hypothyroidism, unspecified: Secondary | ICD-10-CM | POA: Diagnosis not present

## 2022-08-27 DIAGNOSIS — E782 Mixed hyperlipidemia: Secondary | ICD-10-CM | POA: Diagnosis not present

## 2022-08-27 DIAGNOSIS — I10 Essential (primary) hypertension: Secondary | ICD-10-CM | POA: Diagnosis not present

## 2022-08-27 DIAGNOSIS — E1140 Type 2 diabetes mellitus with diabetic neuropathy, unspecified: Secondary | ICD-10-CM | POA: Diagnosis not present

## 2022-08-27 DIAGNOSIS — I700 Atherosclerosis of aorta: Secondary | ICD-10-CM | POA: Diagnosis not present

## 2022-09-02 DIAGNOSIS — J439 Emphysema, unspecified: Secondary | ICD-10-CM | POA: Diagnosis not present

## 2022-09-02 DIAGNOSIS — I2510 Atherosclerotic heart disease of native coronary artery without angina pectoris: Secondary | ICD-10-CM | POA: Diagnosis not present

## 2022-09-02 DIAGNOSIS — R911 Solitary pulmonary nodule: Secondary | ICD-10-CM | POA: Diagnosis not present

## 2022-09-02 DIAGNOSIS — I700 Atherosclerosis of aorta: Secondary | ICD-10-CM | POA: Diagnosis not present

## 2022-09-02 DIAGNOSIS — J432 Centrilobular emphysema: Secondary | ICD-10-CM | POA: Diagnosis not present

## 2022-09-08 DIAGNOSIS — K219 Gastro-esophageal reflux disease without esophagitis: Secondary | ICD-10-CM | POA: Diagnosis not present

## 2022-09-08 DIAGNOSIS — K649 Unspecified hemorrhoids: Secondary | ICD-10-CM | POA: Diagnosis not present

## 2022-09-08 DIAGNOSIS — R062 Wheezing: Secondary | ICD-10-CM | POA: Diagnosis not present

## 2022-09-08 DIAGNOSIS — K5900 Constipation, unspecified: Secondary | ICD-10-CM | POA: Diagnosis not present

## 2022-09-12 ENCOUNTER — Ambulatory Visit (INDEPENDENT_AMBULATORY_CARE_PROVIDER_SITE_OTHER): Payer: BC Managed Care – PPO | Admitting: Licensed Clinical Social Worker

## 2022-09-12 DIAGNOSIS — F3132 Bipolar disorder, current episode depressed, moderate: Secondary | ICD-10-CM

## 2022-09-12 DIAGNOSIS — F4310 Post-traumatic stress disorder, unspecified: Secondary | ICD-10-CM

## 2022-09-12 NOTE — Progress Notes (Signed)
Virtual Visit via Video Note  I connected with Rodney Leach on 09/12/22 at  8:00 AM EST by a video enabled telemedicine application and verified that I am speaking with the correct person using two identifiers.  Location: Patient: home Provider: remote office [place], [place])  I discussed the limitations of evaluation and management by telemedicine and the availability of in person appointments. The patient expressed understanding and agreed to proceed.  I discussed the assessment and treatment plan with the patient. The patient was provided an opportunity to ask questions and all were answered. The patient agreed with the plan and demonstrated an understanding of the instructions.   The patient was advised to call back or seek an in-person evaluation if the symptoms worsen or if the condition fails to improve as anticipated.  I provided 30 minutes of non-face-to-face time during this encounter.   Kesler Wickham R Phallon Haydu, LCSW   THERAPIST PROGRESS NOTE  Session Time: 8:15-8:45a  *pt requested to end session early   Participation Level: Active  Behavioral Response: NAAlertAnxious and Depressed  Type of Therapy: Individual Therapy  Treatment Goals addressed: Reduction in intrusive event recollections, avoidance of event reminders, intense arousal, or disinterest in activities or relationships 3 out of 5 sessions documented   Decrease depressive symptoms and improve levels of effective functioning-pt reports a decrease in overall depression symptoms 3 out of 5 sessions documented    ProgressTowards Goals: Progressing  Interventions: CBT, Motivational Interviewing, and Solution Focused  Summary: Rodney Leach is a 62 y.o. male who presents with continuing symptoms related to bipolar disorder (depression, anxiety).  Patient reports that he is compliant with his medication, and is getting good quality and quantity of sleep.  Allowed pt to explore and express thoughts and feelings  associated with recent life situations and external stressors.  Patient reports that he is continuing to struggle with " getting older".  Patient reports that he is being intentional about going out of the house daily, being around others daily, opening the blinds and letting daylight into the home on a daily basis, and balancing activity/rest as interventions to help manage depression and chronic pain.  Patient reports that he feels his chronic pain limits him at times.  Patient reports that his wife is continuing to be a strong support system for him.  Patient reports that his wife manages all of his medications, and helps him with tasks around the house, and food preparation.  Patient states that his mother is not in good health, but patient tries to see her every day and help her with things that she needs help with.  Patient states it feels good for him to help her out.   Discussed self-care activities, and allow patient to identify things that he is doing on a daily basis to manage his mood and to feel some positive emotions.  Patient reports that he enjoys watching drag racing on his phone, enjoys watching TV, and enjoys listening to music.  Patient states that his wife is intentional about getting him out of the house every day as well.  Reviewed coping skills for managing depression symptoms, managing anxiety, and managing everyday stress.  Encouraged patient to engage socially with others as much as possible.  Patient reports that he is trying his best to manage his complex health related concerns to the best of his ability.  Continued recommendations are as follows: self care behaviors, positive social engagements, focusing on overall work/home/life balance, and focusing on positive physical and emotional  wellness.    Suicidal/Homicidal: No  Therapist Response: Pt is continuing to apply interventions learned in session into daily life situations. Pt is currently on track to meet goals  utilizing interventions mentioned above. Personal growth and progress noted. Treatment to continue as indicated.   Plan: Return again in 4-5 weeks.  Diagnosis:  Encounter Diagnoses  Name Primary?   PTSD (post-traumatic stress disorder) Yes   Bipolar affective disorder, currently depressed, moderate ([place])     Collaboration of Care: Other Pt encouraged to follow up with psychiatrist of record--[facility] in [place] was advised Release of Information must be obtained prior to any record release in order to collaborate their care with an outside provider. Patient/Guardian was advised if they have not already done so to contact the registration department to sign all necessary forms in order for [place] to release information regarding their care.   Consent: Patient/Guardian gives verbal consent for treatment and assignment of benefits for services provided during this visit. Patient/Guardian expressed understanding and agreed to proceed.   [place], LCSW 09/12/2022

## 2022-09-26 DIAGNOSIS — D126 Benign neoplasm of colon, unspecified: Secondary | ICD-10-CM | POA: Diagnosis not present

## 2022-09-26 DIAGNOSIS — E039 Hypothyroidism, unspecified: Secondary | ICD-10-CM | POA: Diagnosis not present

## 2022-09-26 DIAGNOSIS — E119 Type 2 diabetes mellitus without complications: Secondary | ICD-10-CM | POA: Diagnosis not present

## 2022-09-26 DIAGNOSIS — K648 Other hemorrhoids: Secondary | ICD-10-CM | POA: Diagnosis not present

## 2022-09-26 DIAGNOSIS — Z882 Allergy status to sulfonamides status: Secondary | ICD-10-CM | POA: Diagnosis not present

## 2022-09-26 DIAGNOSIS — D122 Benign neoplasm of ascending colon: Secondary | ICD-10-CM | POA: Diagnosis not present

## 2022-09-26 DIAGNOSIS — Z8673 Personal history of transient ischemic attack (TIA), and cerebral infarction without residual deficits: Secondary | ICD-10-CM | POA: Diagnosis not present

## 2022-09-26 DIAGNOSIS — Z86010 Personal history of colonic polyps: Secondary | ICD-10-CM | POA: Diagnosis not present

## 2022-09-26 DIAGNOSIS — I10 Essential (primary) hypertension: Secondary | ICD-10-CM | POA: Diagnosis not present

## 2022-09-26 DIAGNOSIS — F1021 Alcohol dependence, in remission: Secondary | ICD-10-CM | POA: Diagnosis not present

## 2022-09-26 DIAGNOSIS — K635 Polyp of colon: Secondary | ICD-10-CM | POA: Diagnosis not present

## 2022-10-13 DIAGNOSIS — F1010 Alcohol abuse, uncomplicated: Secondary | ICD-10-CM | POA: Diagnosis not present

## 2022-10-13 DIAGNOSIS — F419 Anxiety disorder, unspecified: Secondary | ICD-10-CM | POA: Diagnosis not present

## 2022-10-13 DIAGNOSIS — F3162 Bipolar disorder, current episode mixed, moderate: Secondary | ICD-10-CM | POA: Diagnosis not present

## 2022-10-27 DIAGNOSIS — F1010 Alcohol abuse, uncomplicated: Secondary | ICD-10-CM | POA: Diagnosis not present

## 2022-10-27 DIAGNOSIS — F419 Anxiety disorder, unspecified: Secondary | ICD-10-CM | POA: Diagnosis not present

## 2022-10-27 DIAGNOSIS — F3162 Bipolar disorder, current episode mixed, moderate: Secondary | ICD-10-CM | POA: Diagnosis not present

## 2022-11-04 DIAGNOSIS — F3162 Bipolar disorder, current episode mixed, moderate: Secondary | ICD-10-CM | POA: Diagnosis not present

## 2022-11-04 DIAGNOSIS — F1010 Alcohol abuse, uncomplicated: Secondary | ICD-10-CM | POA: Diagnosis not present

## 2022-11-04 DIAGNOSIS — F418 Other specified anxiety disorders: Secondary | ICD-10-CM | POA: Diagnosis not present

## 2022-11-11 DIAGNOSIS — F418 Other specified anxiety disorders: Secondary | ICD-10-CM | POA: Diagnosis not present

## 2022-11-11 DIAGNOSIS — F1010 Alcohol abuse, uncomplicated: Secondary | ICD-10-CM | POA: Diagnosis not present

## 2022-11-11 DIAGNOSIS — F3162 Bipolar disorder, current episode mixed, moderate: Secondary | ICD-10-CM | POA: Diagnosis not present

## 2022-12-02 DIAGNOSIS — F1010 Alcohol abuse, uncomplicated: Secondary | ICD-10-CM | POA: Diagnosis not present

## 2022-12-02 DIAGNOSIS — F418 Other specified anxiety disorders: Secondary | ICD-10-CM | POA: Diagnosis not present

## 2022-12-02 DIAGNOSIS — F3162 Bipolar disorder, current episode mixed, moderate: Secondary | ICD-10-CM | POA: Diagnosis not present

## 2022-12-09 ENCOUNTER — Ambulatory Visit (INDEPENDENT_AMBULATORY_CARE_PROVIDER_SITE_OTHER): Payer: BC Managed Care – PPO | Admitting: Licensed Clinical Social Worker

## 2022-12-09 DIAGNOSIS — F3132 Bipolar disorder, current episode depressed, moderate: Secondary | ICD-10-CM

## 2022-12-09 DIAGNOSIS — F4310 Post-traumatic stress disorder, unspecified: Secondary | ICD-10-CM

## 2022-12-09 NOTE — Progress Notes (Signed)
Virtual Visit via Video Note  I connected with Rodney Leach on 12/09/22 at  8:00 AM EDT by a video enabled telemedicine application and verified that I am speaking with the correct person using two identifiers.  Location: Patient: home Provider: remote office [place], [place])  I discussed the limitations of evaluation and management by telemedicine and the availability of in person appointments. The patient expressed understanding and agreed to proceed.  I discussed the assessment and treatment plan with the patient. The patient was provided an opportunity to ask questions and all were answered. The patient agreed with the plan and demonstrated an understanding of the instructions.   The patient was advised to call back or seek an in-person evaluation if the symptoms worsen or if the condition fails to improve as anticipated.  I provided 15 minutes of non-face-to-face time during this encounter.   Rasheka Denard R Corinne Goucher, LCSW   THERAPIST PROGRESS NOTE  Session Time: 8:00-815a  *pt requested to end session early   Participation Level: Active  Behavioral Response: NAAlertAnxious and Depressed  Type of Therapy: Individual Therapy  Treatment Goals addressed: Reduction in intrusive event recollections, avoidance of event reminders, intense arousal, or disinterest in activities or relationships 3 out of 5 sessions documented   Decrease depressive symptoms and improve levels of effective functioning-pt reports a decrease in overall depression symptoms 3 out of 5 sessions documented    ProgressTowards Goals: Progressing  Interventions: CBT, Motivational Interviewing, and Solution Focused  Summary: Rodney Leach is a 63 y.o. male who presents with continuing symptoms related to bipolar disorder (depression, anxiety). Pt states he is compliant with medication and is seeing his psychiatrist on a regular basis--1-2 x per month. Pt states that he feels the medication changes are working  well with overall mood regulation.   Clinician assisted pt with identifying situations/scenarios/schemas triggering anxiety and/or depression symptoms. Allowed pt to explore and express thoughts and feelings and discussed current coping mechanisms. Reviewed changes/recommendations. Pt reports that he is spending a lot of time taking care of his 71 yo mother which gets him out of the house, gives him a sense of purpose, and its good social engagement. Pt states he is also helping his daughter with home projects: crown moldings, trimwork, and installing hardwood floors. Pt states he feels good after doing the projects.    Clinician assisted pt with identifying current levels of chronic pain, and explored medical interventions for treating chronic pain. Discussed overall impact of pain on daily activities, relationships, and ability/inability to engage in self care or recreational activities. Reviewed keeping balance between rest and activity, and to continue communicating with medical providers for pain management.   Continued recommendations are as follows: self care behaviors, positive social engagements, focusing on overall work/home/life balance, and focusing on positive physical and emotional wellness.    Suicidal/Homicidal: No  Therapist Response: Pt is continuing to apply interventions learned in session into daily life situations. Pt is currently on track to meet goals utilizing interventions mentioned above. Personal growth and progress noted. Treatment to continue as indicated.   Plan: Return again in 4-5 weeks.  Diagnosis:  Encounter Diagnoses  Name Primary?   PTSD (post-traumatic stress disorder) Yes   Bipolar affective disorder, currently depressed, moderate ([name])    Collaboration of Care: Other Pt encouraged to follow up with psychiatrist of record--[facility] in [place]  Patient/Guardian was advised Release of Information must be obtained prior to any record release in order  to collaborate their care with an  outside provider. Patient/Guardian was advised if they have not already done so to contact the registration department to sign all necessary forms in order for [place] to release information regarding their care.   Consent: Patient/Guardian gives verbal consent for treatment and assignment of benefits for services provided during this visit. Patient/Guardian expressed understanding and agreed to proceed.   Ernest Haber Sindhu Nguyen, LCSW 12/09/2022

## 2023-01-02 ENCOUNTER — Ambulatory Visit (INDEPENDENT_AMBULATORY_CARE_PROVIDER_SITE_OTHER): Payer: BC Managed Care – PPO | Admitting: Licensed Clinical Social Worker

## 2023-01-02 DIAGNOSIS — F3132 Bipolar disorder, current episode depressed, moderate: Secondary | ICD-10-CM

## 2023-01-02 DIAGNOSIS — F4310 Post-traumatic stress disorder, unspecified: Secondary | ICD-10-CM

## 2023-01-02 NOTE — Progress Notes (Signed)
Virtual Visit via Video Note  I connected with Rodney Leach on 01/02/23 at  8:00 AM EDT by a video enabled telemedicine application and verified that I am speaking with the correct person using two identifiers.  Location: Patient: home Provider: remote office [place], [place])  I discussed the limitations of evaluation and management by telemedicine and the availability of in person appointments. The patient expressed understanding and agreed to proceed.  I discussed the assessment and treatment plan with the patient. The patient was provided an opportunity to ask questions and all were answered. The patient agreed with the plan and demonstrated an understanding of the instructions.   The patient was advised to call back or seek an in-person evaluation if the symptoms worsen or if the condition fails to improve as anticipated.  I provided 15 minutes of non-face-to-face time during this encounter.   Rodney Berroa R Savvas Roper, LCSW   THERAPIST PROGRESS NOTE  Session Time: 8:00-815a  Participation Level: Active  Behavioral Response: NAAlertAnxious and Depressed  Type of Therapy: Individual Therapy  Treatment Goals addressed: Reduction in intrusive event recollections, avoidance of event reminders, intense arousal, or disinterest in activities or relationships 3 out of 5 sessions documented   Decrease depressive symptoms and improve levels of effective functioning-pt reports a decrease in overall depression symptoms 3 out of 5 sessions documented    ProgressTowards Goals: Progressing  Interventions: CBT, Motivational Interviewing, and Solution Focused  Summary: Rodney Leach is a 62 y.o. male who presents with continuing symptoms related to bipolar disorder (depression, anxiety). Pt reports fluctuating mood and inconsistent sleep quality/quantity. Pt states he often wakes up at night and gets up out of bed to avoid restlessness in the bed. Clinician reviewed sleep hygiene strategies.   Reviewed stress management strategies. Pt reflects understanding and willingness to cooperate.   Continued recommendations are as follows: self care behaviors, positive social engagements, focusing on overall work/home/life balance, and focusing on positive physical and emotional wellness.    Suicidal/Homicidal: No  Therapist Response: Pt is continuing to apply interventions learned in session into daily life situations. Pt is currently on track to meet goals utilizing interventions mentioned above. Personal growth and progress noted. Treatment to continue as indicated.   Plan: Return again in 4-5 weeks.  Diagnosis:  Encounter Diagnoses  Name Primary?   PTSD (post-traumatic stress disorder) Yes   Bipolar affective disorder, currently depressed, moderate ([name])     Collaboration of Care: Other Pt encouraged to follow up with psychiatrist of record--[facility] in [place]  Patient/Guardian was advised Release of Information must be obtained prior to any record release in order to collaborate their care with an outside provider. Patient/Guardian was advised if they have not already done so to contact the registration department to sign all necessary forms in order for [place] to release information regarding their care.   Consent: Patient/Guardian gives verbal consent for treatment and assignment of benefits for services provided during this visit. Patient/Guardian expressed understanding and agreed to proceed.   Rodney Haber Nalleli Largent, LCSW 01/02/2023

## 2023-01-06 DIAGNOSIS — F1010 Alcohol abuse, uncomplicated: Secondary | ICD-10-CM | POA: Diagnosis not present

## 2023-01-06 DIAGNOSIS — F3162 Bipolar disorder, current episode mixed, moderate: Secondary | ICD-10-CM | POA: Diagnosis not present

## 2023-01-06 DIAGNOSIS — F418 Other specified anxiety disorders: Secondary | ICD-10-CM | POA: Diagnosis not present

## 2023-01-21 DIAGNOSIS — F418 Other specified anxiety disorders: Secondary | ICD-10-CM | POA: Diagnosis not present

## 2023-01-21 DIAGNOSIS — F3162 Bipolar disorder, current episode mixed, moderate: Secondary | ICD-10-CM | POA: Diagnosis not present

## 2023-01-21 DIAGNOSIS — F1010 Alcohol abuse, uncomplicated: Secondary | ICD-10-CM | POA: Diagnosis not present

## 2023-02-19 ENCOUNTER — Ambulatory Visit (INDEPENDENT_AMBULATORY_CARE_PROVIDER_SITE_OTHER): Payer: BC Managed Care – PPO | Admitting: Licensed Clinical Social Worker

## 2023-02-19 DIAGNOSIS — Z636 Dependent relative needing care at home: Secondary | ICD-10-CM

## 2023-02-19 DIAGNOSIS — F4310 Post-traumatic stress disorder, unspecified: Secondary | ICD-10-CM | POA: Diagnosis not present

## 2023-02-19 NOTE — Progress Notes (Signed)
Virtual Visit via Video Note  I connected with Rodney Leach on 02/19/23 at  8:00 AM EDT by a video enabled telemedicine application and verified that I am speaking with the correct person using two identifiers.  Location: Patient: home Provider: remote office [place], [place])  I discussed the limitations of evaluation and management by telemedicine and the availability of in person appointments. The patient expressed understanding and agreed to proceed.  I discussed the assessment and treatment plan with the patient. The patient was provided an opportunity to ask questions and all were answered. The patient agreed with the plan and demonstrated an understanding of the instructions.   The patient was advised to call back or seek an in-person evaluation if the symptoms worsen or if the condition fails to improve as anticipated.  I provided 15 minutes of non-face-to-face time during this encounter.   Rodney Leach R Ahleah Simko, LCSW   THERAPIST PROGRESS NOTE  Session Time: 8:00-815a  Participation Level: Active  Behavioral Response: NAAlertAnxious and Depressed  Type of Therapy: Individual Therapy  Treatment Goals addressed: Reduction in intrusive event recollections, avoidance of event reminders, intense arousal, or disinterest in activities or relationships 3 out of 5 sessions documented   Decrease depressive symptoms and improve levels of effective functioning-pt reports a decrease in overall depression symptoms 3 out of 5 sessions documented    ProgressTowards Goals: Progressing  Interventions: CBT, Motivational Interviewing, and Solution Focused  Summary: Rodney Leach is a 62 y.o. male who presents with continuing symptoms related to bipolar disorder (depression, anxiety).   Clinician assisted pt with identifying situations/scenarios/schemas triggering anxiety and/or depression symptoms. Allowed pt to explore and express thoughts and feelings and discussed current coping  mechanisms. Reviewed changes/recommendations. Assisted pt with identifying stress associated with caring for their mother after back surgery. Provided pt with positive support and encouragement. Validated pt experience and provided support.  Pt denies any significant episodes of depression or any anger episodes. Pt feels that they are managing situational stressors well. Pt reports that sleep quality and quantity is good and pt reporting normal appetite. Pt feels that they are doing a good job of using coping skills in the moment. Pt does not have any other questions or concerns to discuss at today's session.   Continued recommendations are as follows: self care behaviors, positive social engagements, focusing on overall work/home/life balance, and focusing on positive physical and emotional wellness.    Suicidal/Homicidal: No  Therapist Response: Pt is continuing to apply interventions learned in session into daily life situations. Pt is currently on track to meet goals utilizing interventions mentioned above. Personal growth and progress noted. Treatment to continue as indicated.   Plan: Informed patient that clinician will be leaving outpatient department. Allowed pt to explore any questions or concerns and discussed future counseling options/resources. Provided pt with psychoeducational resources and list of OPT therapists. Encouraged pt to continue with psychiatric med management appointments, if applicable.   Diagnosis:  Encounter Diagnoses  Name Primary?   PTSD (post-traumatic stress disorder) Yes   Caregiver stress    Collaboration of Care: Other Pt encouraged to follow up with psychiatrist of record--Rodney Leach in [place]  Patient/Guardian was advised Release of Information must be obtained prior to any record release in order to collaborate their care with an outside provider. Patient/Guardian was advised if they have not already done so to contact the registration department to  sign all necessary forms in order for Rodney Leach to release information regarding their care.   Consent:  Patient/Guardian gives verbal consent for treatment and assignment of benefits for services provided during this visit. Patient/Guardian expressed understanding and agreed to proceed.   Ernest Haber Autumm Hattery, LCSW 02/19/2023

## 2023-02-19 NOTE — Patient Instructions (Signed)
Outpatient Psychiatry and Counseling  FOR CRISIS:  call 911, [facility]: Mobile Crisis Management 24 hours:  [phone], call [phone], [facility] ([facility]) [address] walk in, or go to your local EMERGENCY DEPARTMENT  [facility] [address], [place], [place] [address]  [phone]  [facility] [address] [place], [place] [address] [phone]  [facility] Address: [address] [address], [place], [place] [address] Phone: [phone]  [facility] ([place] Locations) [email or web address]  [facility] sliding scale fee and walk in schedule: M-F 8am-12pm/1pm-3pm [address]  [place], [place] [address] [phone]  [facility] [address] [place], [place] [address] [phone]  [facility]/ Intensive Outpatient Therapy Program/CDIOP/PHP [address] [place], [place] [address] [phone]  [facility]                  Crisis Services, Outpatient Therapy Services, Walk in Services      [phone]     [address]    [place], [place] [address]                 [facility]   [facility] [phone] [address] [place], [place] [address]  [facility]          [address],  [place], [place] [address]       [phone]  [facility] [address] [place], [place] [address] [phone]  [facility]    [address]    [place], [place] [address]     [phone]       [facility] [address] [place] [address]  [facility]     [address]     [place], [place]      [phone]       [facility] Shamsher Ahluwalia, MD [address]  [address] [place], [place] [address] [phone]  [facility]     [address]     [place], [place] [address]     [phone]       [facility] [address] [place], [place] [address] [phone] Resources for Temporary Residential Assistance/Crisis Centers  

## 2023-02-24 DIAGNOSIS — A084 Viral intestinal infection, unspecified: Secondary | ICD-10-CM | POA: Diagnosis not present

## 2023-03-02 DIAGNOSIS — I10 Essential (primary) hypertension: Secondary | ICD-10-CM | POA: Diagnosis not present

## 2023-03-02 DIAGNOSIS — E039 Hypothyroidism, unspecified: Secondary | ICD-10-CM | POA: Diagnosis not present

## 2023-03-02 DIAGNOSIS — R972 Elevated prostate specific antigen [PSA]: Secondary | ICD-10-CM | POA: Diagnosis not present

## 2023-03-02 DIAGNOSIS — E782 Mixed hyperlipidemia: Secondary | ICD-10-CM | POA: Diagnosis not present

## 2023-03-02 DIAGNOSIS — I700 Atherosclerosis of aorta: Secondary | ICD-10-CM | POA: Diagnosis not present

## 2023-03-02 DIAGNOSIS — E1140 Type 2 diabetes mellitus with diabetic neuropathy, unspecified: Secondary | ICD-10-CM | POA: Diagnosis not present

## 2023-03-10 DIAGNOSIS — F3162 Bipolar disorder, current episode mixed, moderate: Secondary | ICD-10-CM | POA: Diagnosis not present

## 2023-03-10 DIAGNOSIS — F1010 Alcohol abuse, uncomplicated: Secondary | ICD-10-CM | POA: Diagnosis not present

## 2023-03-10 DIAGNOSIS — F418 Other specified anxiety disorders: Secondary | ICD-10-CM | POA: Diagnosis not present

## 2023-03-11 DIAGNOSIS — F4322 Adjustment disorder with anxiety: Secondary | ICD-10-CM | POA: Diagnosis not present

## 2023-03-16 DIAGNOSIS — G4733 Obstructive sleep apnea (adult) (pediatric): Secondary | ICD-10-CM | POA: Diagnosis not present

## 2023-03-17 DIAGNOSIS — F3181 Bipolar II disorder: Secondary | ICD-10-CM | POA: Diagnosis not present

## 2023-03-24 DIAGNOSIS — F418 Other specified anxiety disorders: Secondary | ICD-10-CM | POA: Diagnosis not present

## 2023-03-24 DIAGNOSIS — F1010 Alcohol abuse, uncomplicated: Secondary | ICD-10-CM | POA: Diagnosis not present

## 2023-03-24 DIAGNOSIS — F3162 Bipolar disorder, current episode mixed, moderate: Secondary | ICD-10-CM | POA: Diagnosis not present

## 2023-03-27 DIAGNOSIS — F3181 Bipolar II disorder: Secondary | ICD-10-CM | POA: Diagnosis not present

## 2023-04-06 DIAGNOSIS — F3181 Bipolar II disorder: Secondary | ICD-10-CM | POA: Diagnosis not present

## 2023-04-16 DIAGNOSIS — G4733 Obstructive sleep apnea (adult) (pediatric): Secondary | ICD-10-CM | POA: Diagnosis not present

## 2023-04-21 DIAGNOSIS — F3162 Bipolar disorder, current episode mixed, moderate: Secondary | ICD-10-CM | POA: Diagnosis not present

## 2023-04-21 DIAGNOSIS — F418 Other specified anxiety disorders: Secondary | ICD-10-CM | POA: Diagnosis not present

## 2023-04-21 DIAGNOSIS — F1010 Alcohol abuse, uncomplicated: Secondary | ICD-10-CM | POA: Diagnosis not present

## 2023-05-16 DIAGNOSIS — G4733 Obstructive sleep apnea (adult) (pediatric): Secondary | ICD-10-CM | POA: Diagnosis not present

## 2023-06-16 DIAGNOSIS — G4733 Obstructive sleep apnea (adult) (pediatric): Secondary | ICD-10-CM | POA: Diagnosis not present

## 2023-06-23 IMAGING — CT CT HEAD W/O CM
4 series · 16 of 47 positions shown, 18 images · non-contrast
Comparison: 10/03/2021

CLINICAL DATA: Altered level of consciousness, intoxicated,
hypotension



[Series 3: head bone · axial · 0.44mm/px · z∈[-113,-77]mm · 3 of 94 slices shown]
[im 10/94  bone]
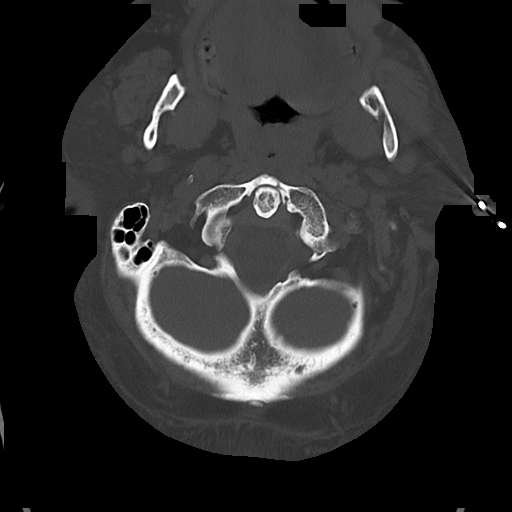
[im 19/94  bone]
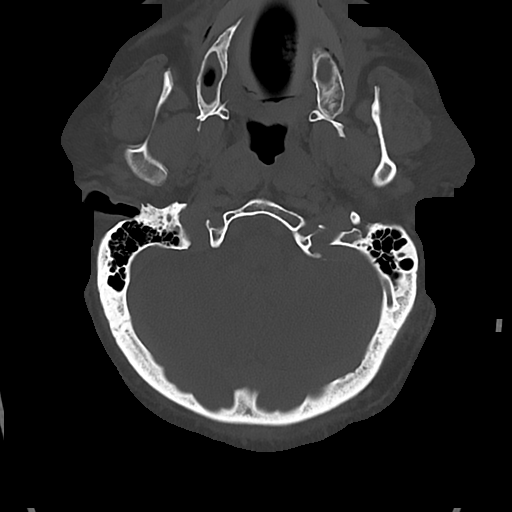
[im 28/94  bone]
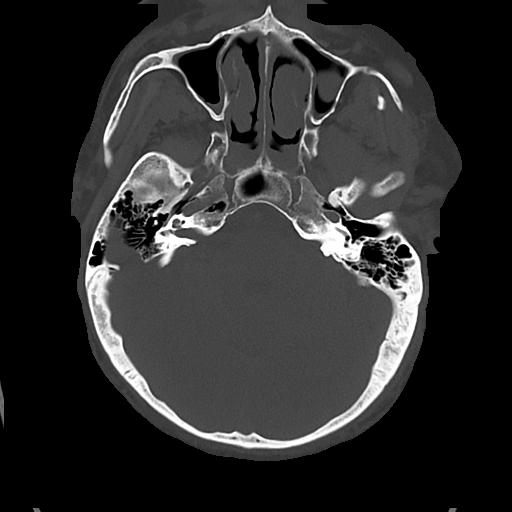

[Series 4: head (person_name) · axial · 0.44mm/px · z∈[-111,+29]mm · 7 of 38 slices shown, 9 images]
[im 5/38  brain]
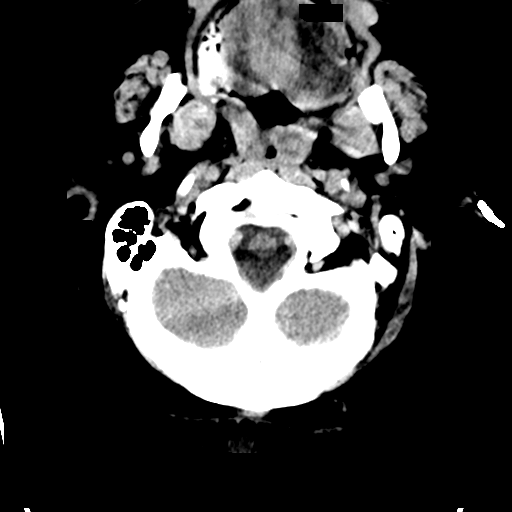
[im 5/38  bone]
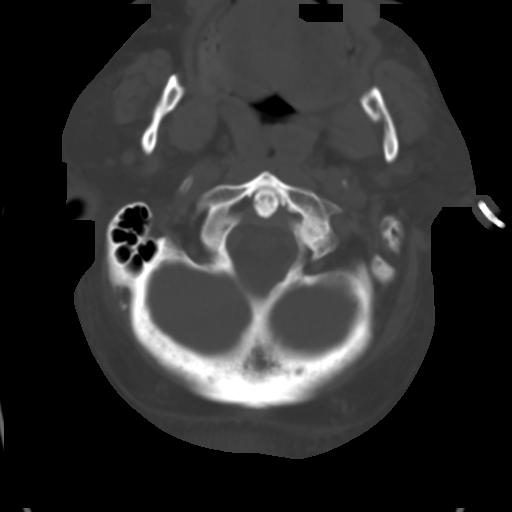
[im 10/38  brain]
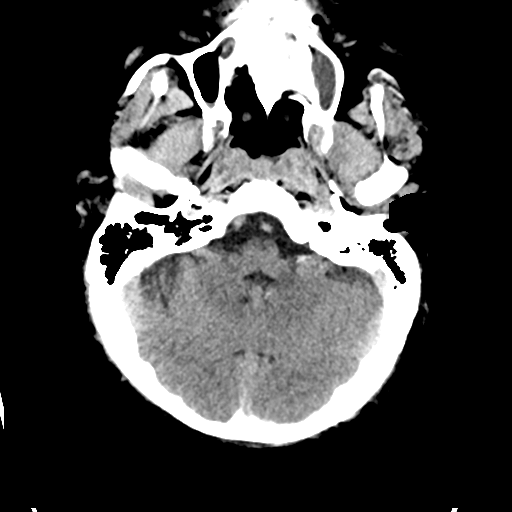
[im 14/38  brain]
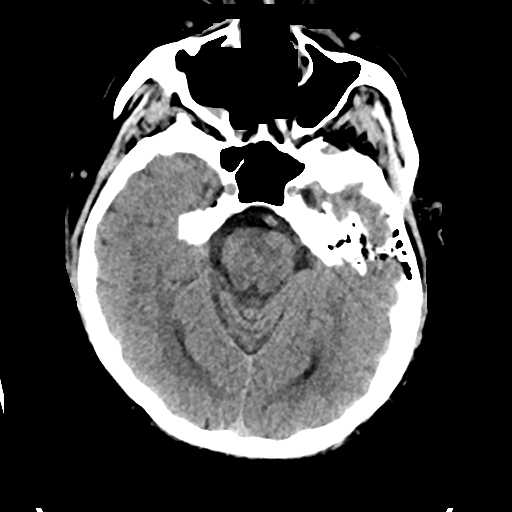
[im 19/38  brain]
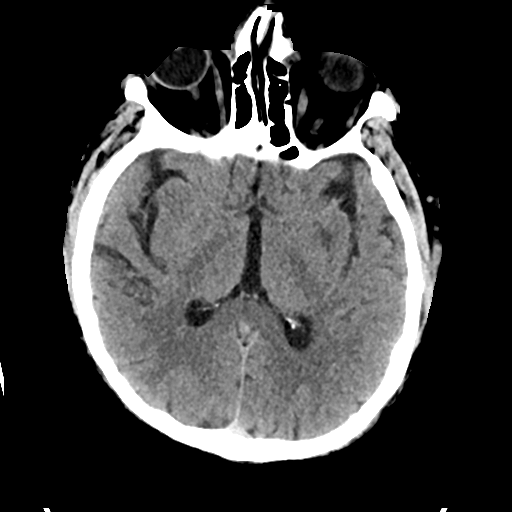
[im 24/38  brain]
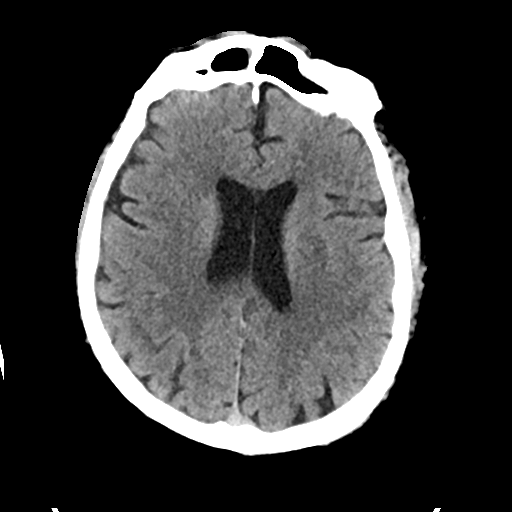
[im 24/38  bone]
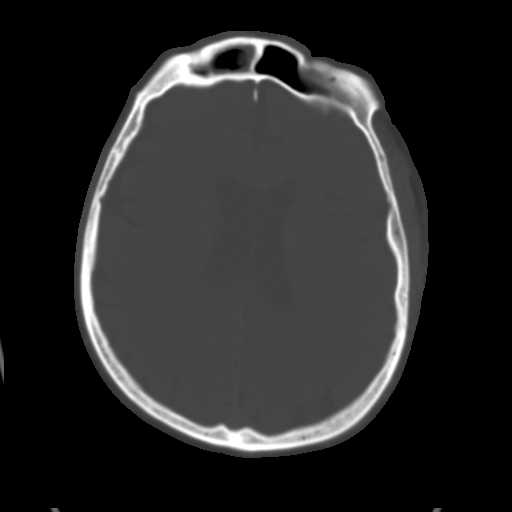
[im 28/38  brain]
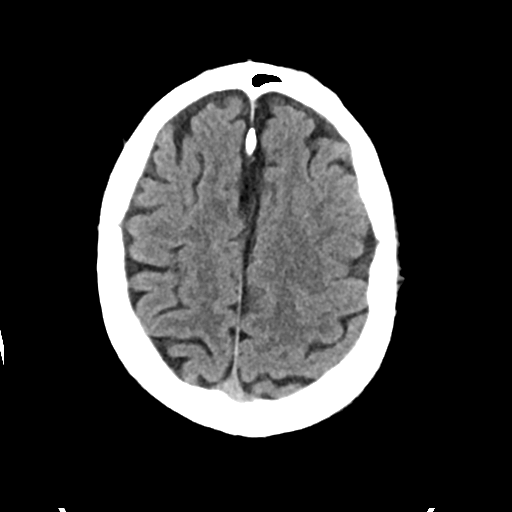
[im 33/38  brain]
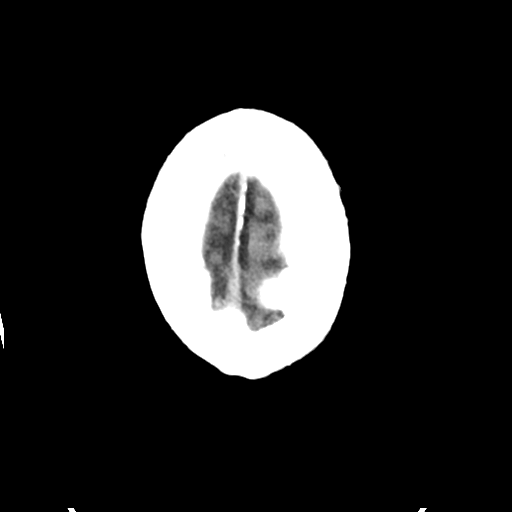

[Series 5: cor soft (person_name) · coronal · 0.35mm/px · 3 of 78 slices shown]
[im 28/78  brain]
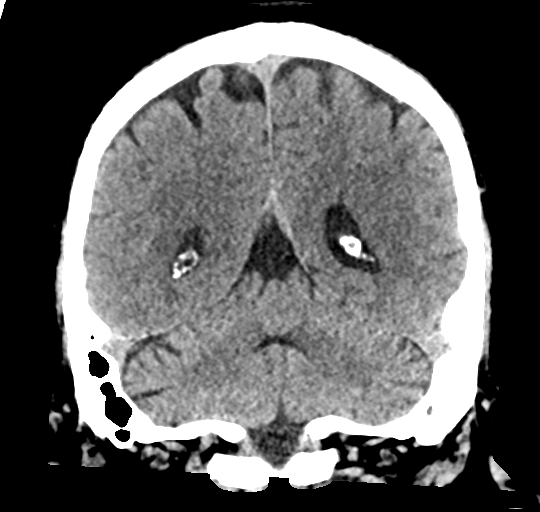
[im 35/78  brain]
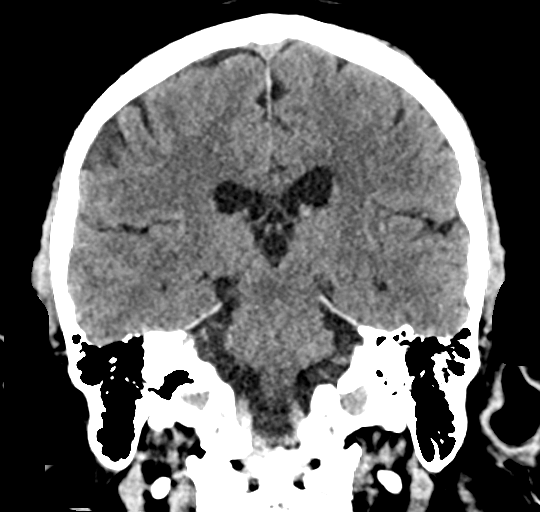
[im 43/78  brain]
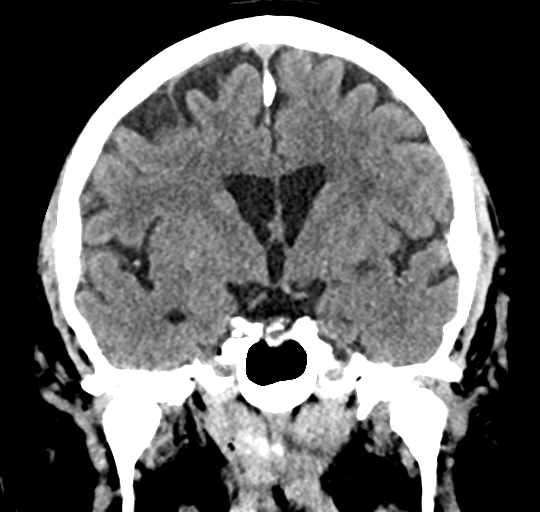

[Series 6: sag soft (person_name) · sagittal · 0.37mm/px · 3 of 65 slices shown]
[im 22/65  brain]
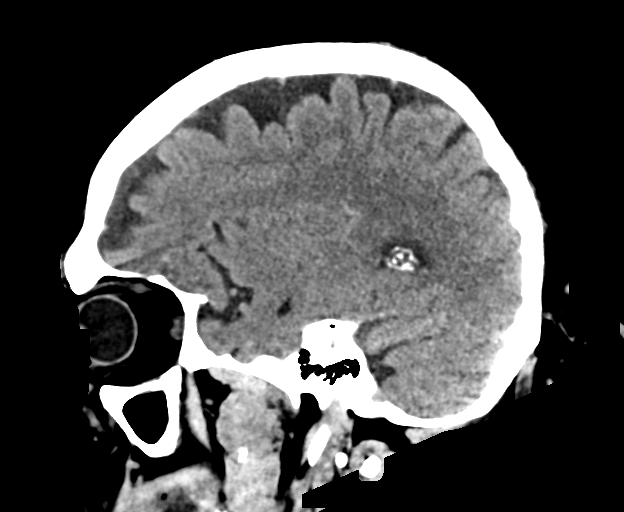
[im 33/65  brain]
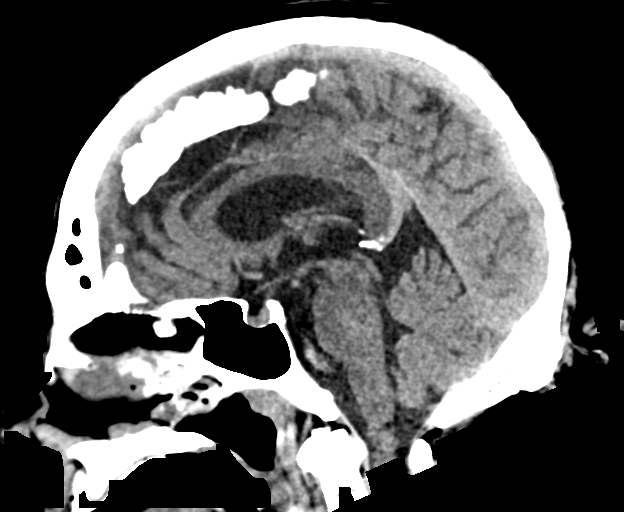
[im 43/65  brain]
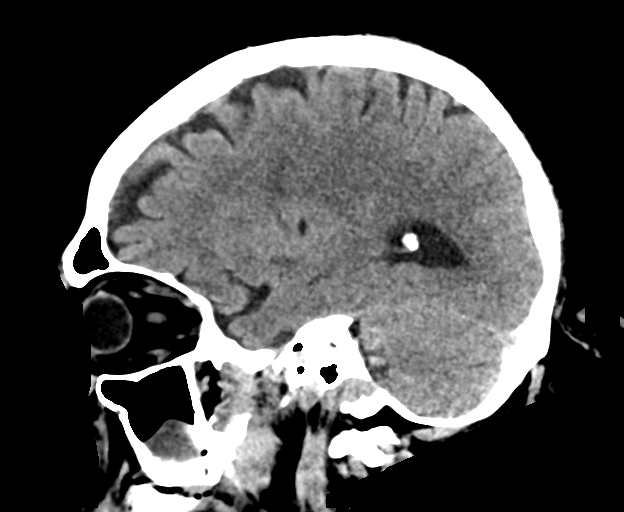

[16 of 47 positions shown; findings below may reference images not displayed]

FINDINGS: Brain: Stable hypodensities within the bilateral basal ganglia and
periventricular white matter consistent with chronic small vessel
ischemic change. No evidence of acute infarct or hemorrhage. Lateral
ventricles and midline structures are unremarkable. No acute
extra-axial fluid collections. No mass effect.

Vascular: No hyperdense vessel or unexpected calcification.

Skull: Normal. Negative for fracture or focal lesion.

Sinuses/Orbits: Stable postsurgical changes from ethmoidectomies and
bilateral medial wall antrectomies. Stable mucosal thickening
throughout the maxillary, ethmoid, and frontal sinuses.

Other: None.
IMPRESSION: 1. Stable head CT, no acute intracranial process.

## 2023-06-23 IMAGING — CT CT CHEST W/ CM
2 of 4 series · 15 of 36 positions shown, 18 images · IV contrast (APPLIED)
Comparison: September 01, 2012.

CLINICAL DATA: Hypoxia, shortness of breath.

EXAM:
CT CHEST WITH CONTRAST
TECHNIQUE: Multidetector CT imaging of the chest was performed during
intravenous contrast administration.

[Series 3: chest (person_name) · axial · 0.87mm/px · z∈[-510,-240]mm · 12 of 161 slices shown, 15 images]
[im 13/161  mediastinal]
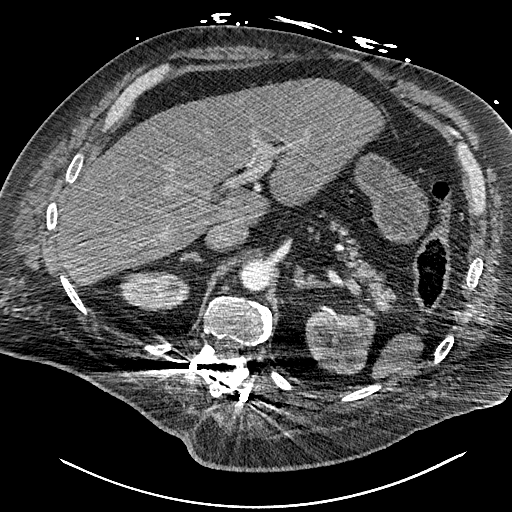
[im 13/161  lung]
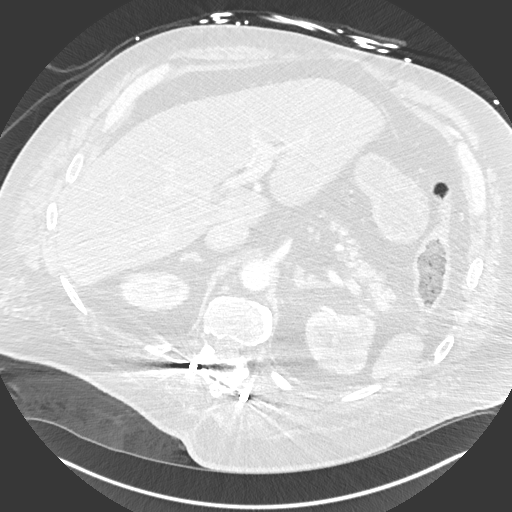
[im 25/161  lung]
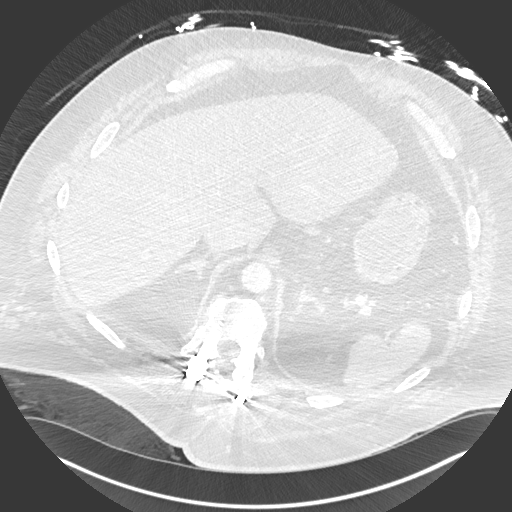
[im 37/161  lung]
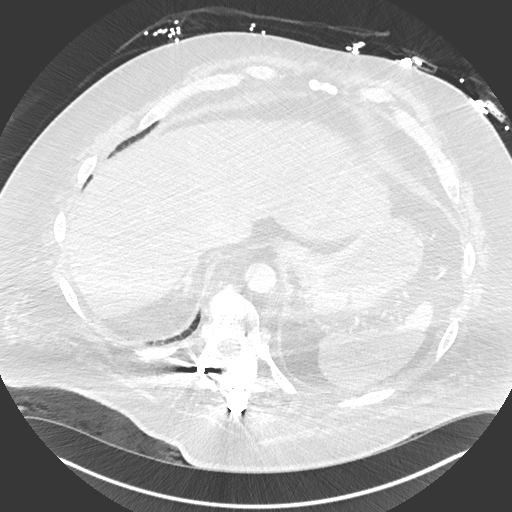
[im 50/161  lung]
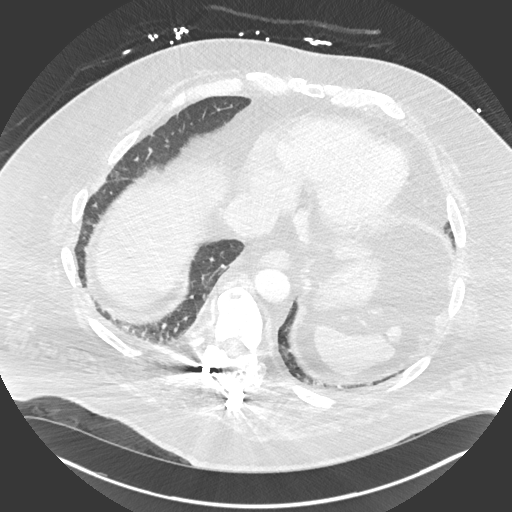
[im 62/161  mediastinal]
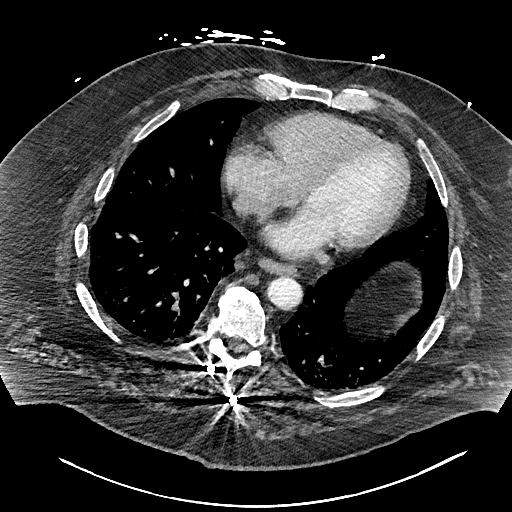
[im 62/161  lung]
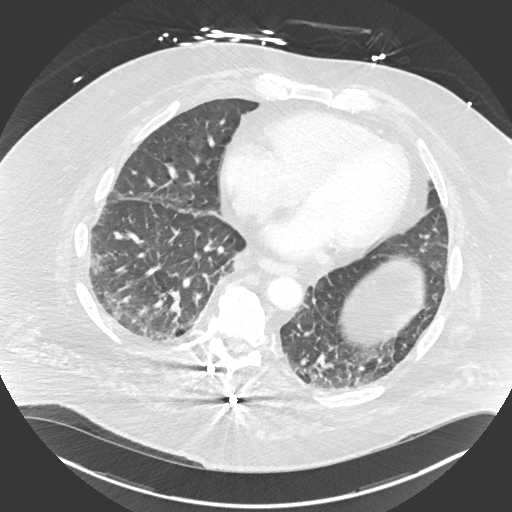
[im 74/161  lung]
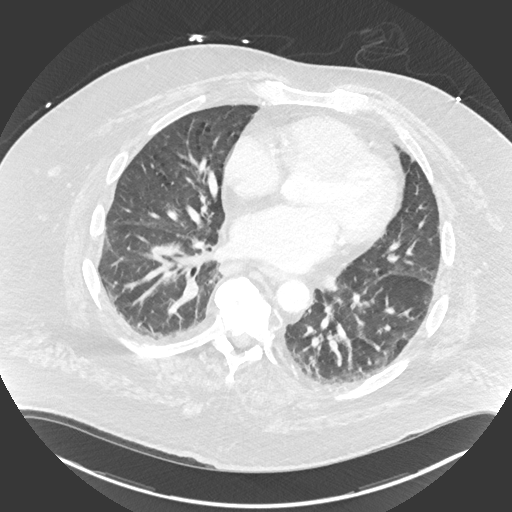
[im 87/161  lung]
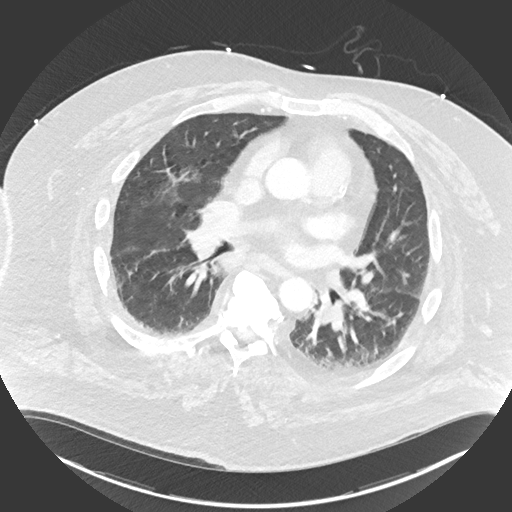
[im 99/161  lung]
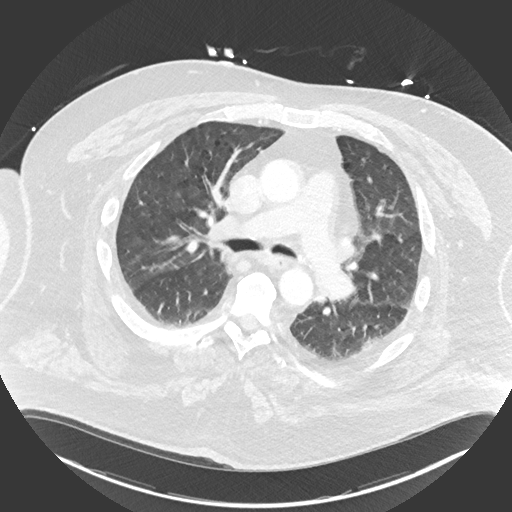
[im 111/161  mediastinal]
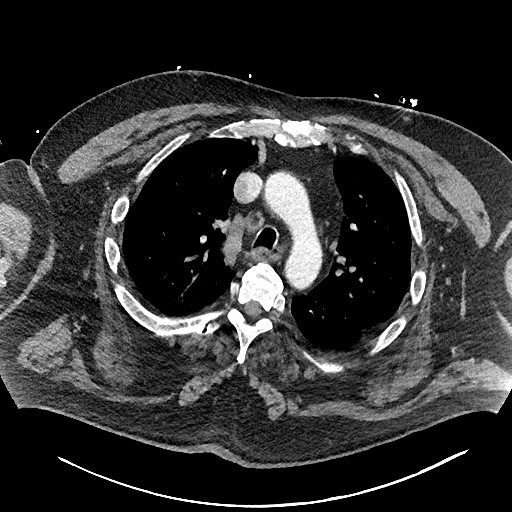
[im 111/161  lung]
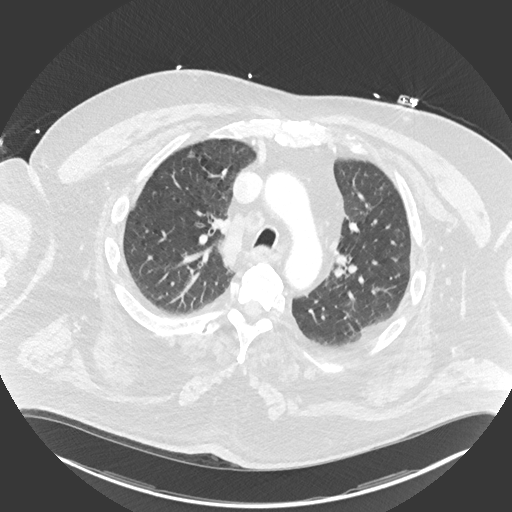
[im 124/161  lung]
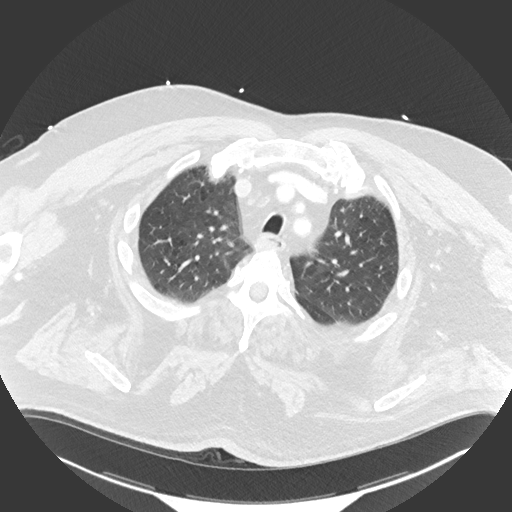
[im 136/161  lung]
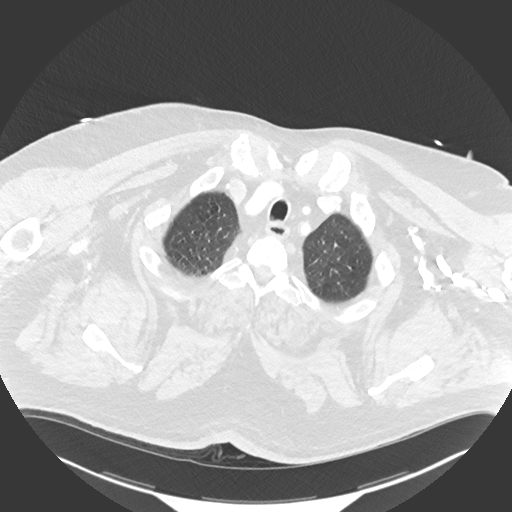
[im 148/161  lung]
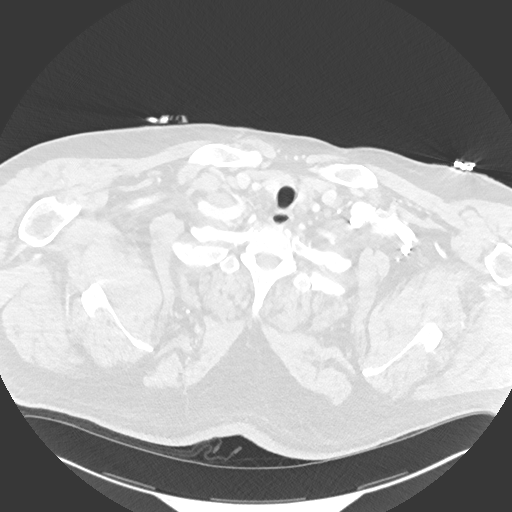

[Series 6: (person_name) · coronal · 0.65mm/px · 3 of 201 slices shown]
[im 41/201  lung]
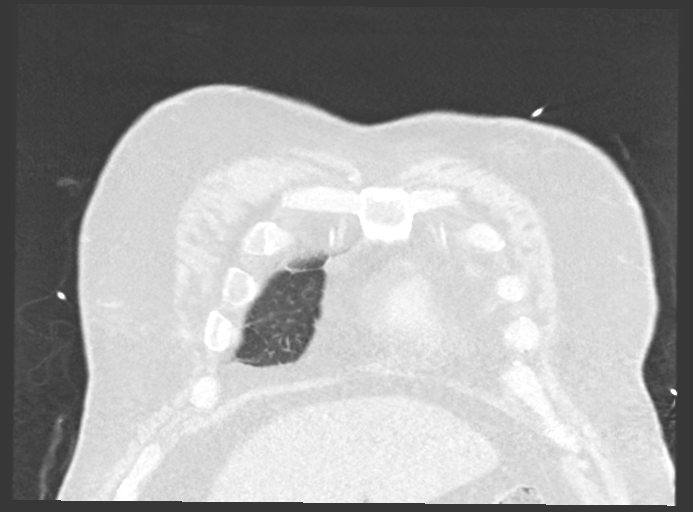
[im 81/201  lung]
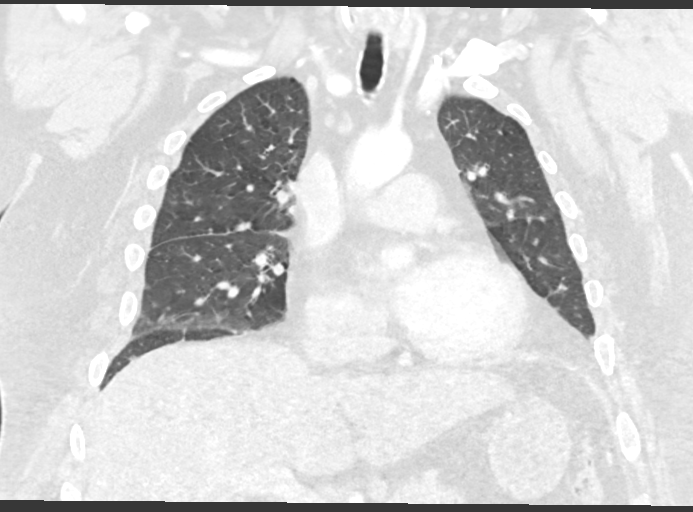
[im 121/201  lung]
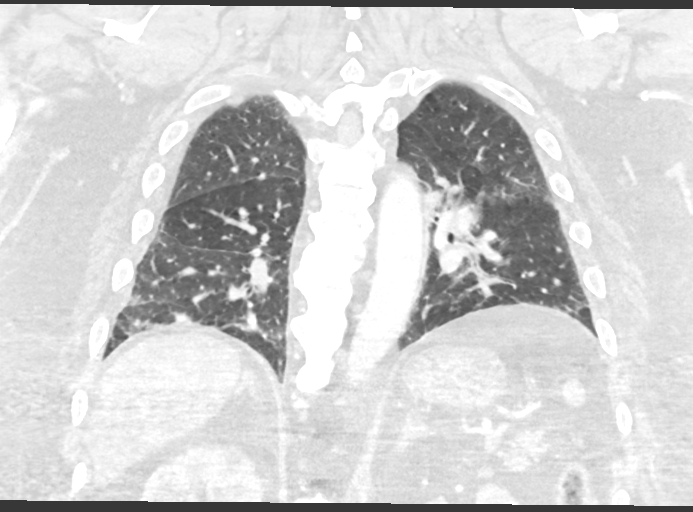

[15 of 36 positions shown; findings below may reference images not displayed]

RADIATION DOSE REDUCTION: This exam was performed according to the
departmental dose-optimization program which includes automated
exposure control, adjustment of the mA and/or kV according to
patient size and/or use of iterative reconstruction technique.

CONTRAST:  75mL OMNIPAQUE IOHEXOL 350 MG/ML SOLN
FINDINGS: Cardiovascular: No significant vascular findings. Normal heart size.
No pericardial effusion.

Mediastinum/Nodes: No enlarged mediastinal, hilar, or axillary lymph
nodes. Thyroid gland, trachea, and esophagus demonstrate no
significant findings.

Lungs/Pleura: No pneumothorax or pleural effusion is noted. Minimal
bibasilar subsegmental atelectasis is noted.

Upper Abdomen: No acute abnormality.

Musculoskeletal: No chest wall abnormality. No acute or significant
osseous findings.
IMPRESSION: Minimal bibasilar subsegmental atelectasis or scarring.

No other significant abnormality seen in the chest.

## 2023-07-02 DIAGNOSIS — G4733 Obstructive sleep apnea (adult) (pediatric): Secondary | ICD-10-CM | POA: Diagnosis not present

## 2023-07-14 DIAGNOSIS — F418 Other specified anxiety disorders: Secondary | ICD-10-CM | POA: Diagnosis not present

## 2023-07-14 DIAGNOSIS — F3162 Bipolar disorder, current episode mixed, moderate: Secondary | ICD-10-CM | POA: Diagnosis not present

## 2023-07-14 DIAGNOSIS — F1010 Alcohol abuse, uncomplicated: Secondary | ICD-10-CM | POA: Diagnosis not present

## 2023-07-16 DIAGNOSIS — G4733 Obstructive sleep apnea (adult) (pediatric): Secondary | ICD-10-CM | POA: Diagnosis not present

## 2023-07-23 DIAGNOSIS — E1140 Type 2 diabetes mellitus with diabetic neuropathy, unspecified: Secondary | ICD-10-CM | POA: Diagnosis not present

## 2023-07-23 DIAGNOSIS — J029 Acute pharyngitis, unspecified: Secondary | ICD-10-CM | POA: Diagnosis not present

## 2023-07-23 DIAGNOSIS — J441 Chronic obstructive pulmonary disease with (acute) exacerbation: Secondary | ICD-10-CM | POA: Diagnosis not present

## 2023-08-01 DIAGNOSIS — G4733 Obstructive sleep apnea (adult) (pediatric): Secondary | ICD-10-CM | POA: Diagnosis not present

## 2023-08-16 DIAGNOSIS — G4733 Obstructive sleep apnea (adult) (pediatric): Secondary | ICD-10-CM | POA: Diagnosis not present

## 2023-08-17 DIAGNOSIS — E1140 Type 2 diabetes mellitus with diabetic neuropathy, unspecified: Secondary | ICD-10-CM | POA: Diagnosis not present

## 2023-08-17 DIAGNOSIS — E782 Mixed hyperlipidemia: Secondary | ICD-10-CM | POA: Diagnosis not present

## 2023-08-17 DIAGNOSIS — E039 Hypothyroidism, unspecified: Secondary | ICD-10-CM | POA: Diagnosis not present

## 2023-08-17 DIAGNOSIS — I10 Essential (primary) hypertension: Secondary | ICD-10-CM | POA: Diagnosis not present

## 2023-08-17 DIAGNOSIS — Z23 Encounter for immunization: Secondary | ICD-10-CM | POA: Diagnosis not present

## 2023-08-17 DIAGNOSIS — I700 Atherosclerosis of aorta: Secondary | ICD-10-CM | POA: Diagnosis not present

## 2023-08-18 DIAGNOSIS — E875 Hyperkalemia: Secondary | ICD-10-CM | POA: Diagnosis not present

## 2023-08-26 DIAGNOSIS — Z122 Encounter for screening for malignant neoplasm of respiratory organs: Secondary | ICD-10-CM | POA: Diagnosis not present

## 2023-08-26 DIAGNOSIS — Z87891 Personal history of nicotine dependence: Secondary | ICD-10-CM | POA: Diagnosis not present

## 2023-08-27 DIAGNOSIS — Z87891 Personal history of nicotine dependence: Secondary | ICD-10-CM | POA: Diagnosis not present

## 2023-09-01 DIAGNOSIS — G4733 Obstructive sleep apnea (adult) (pediatric): Secondary | ICD-10-CM | POA: Diagnosis not present

## 2023-09-09 DIAGNOSIS — E875 Hyperkalemia: Secondary | ICD-10-CM | POA: Diagnosis not present

## 2023-09-16 DIAGNOSIS — G4733 Obstructive sleep apnea (adult) (pediatric): Secondary | ICD-10-CM | POA: Diagnosis not present

## 2023-09-29 DIAGNOSIS — F418 Other specified anxiety disorders: Secondary | ICD-10-CM | POA: Diagnosis not present

## 2023-09-29 DIAGNOSIS — F3162 Bipolar disorder, current episode mixed, moderate: Secondary | ICD-10-CM | POA: Diagnosis not present

## 2023-09-29 DIAGNOSIS — F1010 Alcohol abuse, uncomplicated: Secondary | ICD-10-CM | POA: Diagnosis not present

## 2023-10-14 DIAGNOSIS — G4733 Obstructive sleep apnea (adult) (pediatric): Secondary | ICD-10-CM | POA: Diagnosis not present

## 2023-10-16 DIAGNOSIS — F418 Other specified anxiety disorders: Secondary | ICD-10-CM | POA: Diagnosis not present

## 2023-10-16 DIAGNOSIS — F1010 Alcohol abuse, uncomplicated: Secondary | ICD-10-CM | POA: Diagnosis not present

## 2023-10-16 DIAGNOSIS — F3162 Bipolar disorder, current episode mixed, moderate: Secondary | ICD-10-CM | POA: Diagnosis not present

## 2023-10-22 DIAGNOSIS — F1020 Alcohol dependence, uncomplicated: Secondary | ICD-10-CM | POA: Diagnosis not present

## 2023-10-23 DIAGNOSIS — F4321 Adjustment disorder with depressed mood: Secondary | ICD-10-CM | POA: Diagnosis not present

## 2023-10-23 DIAGNOSIS — E119 Type 2 diabetes mellitus without complications: Secondary | ICD-10-CM | POA: Diagnosis not present

## 2023-10-23 DIAGNOSIS — F17210 Nicotine dependence, cigarettes, uncomplicated: Secondary | ICD-10-CM | POA: Diagnosis not present

## 2023-10-23 DIAGNOSIS — F331 Major depressive disorder, recurrent, moderate: Secondary | ICD-10-CM | POA: Diagnosis not present

## 2023-10-23 DIAGNOSIS — Z7984 Long term (current) use of oral hypoglycemic drugs: Secondary | ICD-10-CM | POA: Diagnosis not present

## 2023-10-23 DIAGNOSIS — Z9151 Personal history of suicidal behavior: Secondary | ICD-10-CM | POA: Diagnosis not present

## 2023-10-23 DIAGNOSIS — I10 Essential (primary) hypertension: Secondary | ICD-10-CM | POA: Diagnosis not present

## 2023-10-23 DIAGNOSIS — F319 Bipolar disorder, unspecified: Secondary | ICD-10-CM | POA: Diagnosis not present

## 2023-10-23 DIAGNOSIS — E039 Hypothyroidism, unspecified: Secondary | ICD-10-CM | POA: Diagnosis not present

## 2023-10-23 DIAGNOSIS — F10239 Alcohol dependence with withdrawal, unspecified: Secondary | ICD-10-CM | POA: Diagnosis not present

## 2023-10-23 DIAGNOSIS — M792 Neuralgia and neuritis, unspecified: Secondary | ICD-10-CM | POA: Diagnosis not present

## 2023-10-23 DIAGNOSIS — E785 Hyperlipidemia, unspecified: Secondary | ICD-10-CM | POA: Diagnosis not present

## 2023-11-05 DIAGNOSIS — K649 Unspecified hemorrhoids: Secondary | ICD-10-CM | POA: Diagnosis not present

## 2023-11-05 DIAGNOSIS — E119 Type 2 diabetes mellitus without complications: Secondary | ICD-10-CM | POA: Diagnosis not present

## 2023-11-10 DIAGNOSIS — F418 Other specified anxiety disorders: Secondary | ICD-10-CM | POA: Diagnosis not present

## 2023-11-10 DIAGNOSIS — F1010 Alcohol abuse, uncomplicated: Secondary | ICD-10-CM | POA: Diagnosis not present

## 2023-11-10 DIAGNOSIS — F3162 Bipolar disorder, current episode mixed, moderate: Secondary | ICD-10-CM | POA: Diagnosis not present

## 2023-11-14 DIAGNOSIS — G4733 Obstructive sleep apnea (adult) (pediatric): Secondary | ICD-10-CM | POA: Diagnosis not present

## 2023-11-18 DIAGNOSIS — F1010 Alcohol abuse, uncomplicated: Secondary | ICD-10-CM | POA: Diagnosis not present

## 2023-11-18 DIAGNOSIS — F418 Other specified anxiety disorders: Secondary | ICD-10-CM | POA: Diagnosis not present

## 2023-11-18 DIAGNOSIS — F3162 Bipolar disorder, current episode mixed, moderate: Secondary | ICD-10-CM | POA: Diagnosis not present

## 2023-11-23 ENCOUNTER — Encounter: Payer: Self-pay | Admitting: Cardiology

## 2023-11-24 ENCOUNTER — Encounter: Payer: Self-pay | Admitting: Cardiology

## 2023-11-24 ENCOUNTER — Ambulatory Visit: Attending: Cardiology | Admitting: Cardiology

## 2023-11-24 VITALS — BP 128/78 | HR 72 | Ht 76.0 in | Wt 338.6 lb

## 2023-11-24 DIAGNOSIS — I10 Essential (primary) hypertension: Secondary | ICD-10-CM | POA: Diagnosis not present

## 2023-11-24 DIAGNOSIS — I700 Atherosclerosis of aorta: Secondary | ICD-10-CM | POA: Diagnosis not present

## 2023-11-24 DIAGNOSIS — F17200 Nicotine dependence, unspecified, uncomplicated: Secondary | ICD-10-CM | POA: Diagnosis not present

## 2023-11-24 DIAGNOSIS — I2510 Atherosclerotic heart disease of native coronary artery without angina pectoris: Secondary | ICD-10-CM

## 2023-11-24 DIAGNOSIS — G4733 Obstructive sleep apnea (adult) (pediatric): Secondary | ICD-10-CM | POA: Diagnosis not present

## 2023-11-24 DIAGNOSIS — E118 Type 2 diabetes mellitus with unspecified complications: Secondary | ICD-10-CM

## 2023-11-24 DIAGNOSIS — E782 Mixed hyperlipidemia: Secondary | ICD-10-CM

## 2023-11-24 MED ORDER — ROSUVASTATIN CALCIUM 10 MG PO TABS: 10.0000 mg | ORAL_TABLET | Freq: Every day | ORAL | 3 refills | Status: AC

## 2023-11-24 NOTE — Progress Notes (Signed)
 Cardiology Office Note:    Date:  11/24/2023   ID:  Rodney Leach, DOB 1961-02-07, MRN [id number]  PCP:  Annette Barters, MD  Cardiologist:  Nelia Balzarine, MD   Referring MD: Annette Barters, MD    ASSESSMENT:    1. Essential hypertension   2. Aortic atherosclerosis ([name])   3. Coronary artery disease involving native coronary artery of native heart without angina pectoris   4. Obstructive sleep apnea   5. Type 2 diabetes mellitus with complication, without long-term current use of insulin ([name])   6. Morbid obesity ([name])   7. Mixed hyperlipidemia    PLAN:    In order of problems listed above:  Coronary artery disease: I discussed coronary angiography report from the past.  Importance of compliance with diet medication stressed importance of exercise stress echo vocalized understanding and questions were answered to satisfaction. Essential hypertension: Blood pressure stable and diet was emphasized.  Follow-up blood pressure in the clinic was within normal limits.  Lifestyle modification and salt intake issues were discussed. Mixed dyslipidemia: Lipids were reviewed and they are not at goal I doubled Crestor to 10 mg daily.  Liver lipid check in 6 weeks.  Diet emphasized. Cigarette smoker: I spent 5 minutes with the patient discussing solely about smoking. Smoking cessation was counseled. I suggested to the patient also different medications and pharmacological interventions. Patient is keen to try stopping on its own at this time. He will get back to me if he needs any further assistance in this matter. Morbid obesity: Weight reduction stressed.  Diet emphasized and he promises to do better. Patient will be seen in follow-up appointment in 9 months or earlier if the patient has any concerns.    Medication Adjustments/Labs and Tests Ordered: Current medicines are reviewed at length with the patient today.  Concerns regarding medicines are outlined above.  Orders Placed This  Encounter  Procedures   EKG 12-Lead   No orders of the defined types were placed in this encounter.    No chief complaint on file.    History of Present Illness:    Rodney Leach is a 63 y.o. male.  Patient has past medical history of essential hypertension, mixed dyslipidemia, morbid obesity CAD, nonobstructive coronary artery disease and cigarette smoking.  He denies any problems at this time and takes care of activities of daily living.  He leads a sedentary lifestyle.  Unfortunately continues to smoke.  At the time of my evaluation, the patient is alert awake oriented and in no distress.  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 CERVICAL 5-6, CERVICAL 6-7   Chronic pain syndrome    Cigarette smoker 12/30/2021   Complication of anesthesia    has woken up during surgery  before    Depression 09/03/2012   Adequate for discharge   Diabetes mellitus without complication ([name])    Emphysema/COPD ([name])    Essential hypertension 10/09/2021   Blood pressure is picking up.  Hypotensive on arrival.  Resume amlodipine today.  May not need to multiple antihypertensives.   GERD (gastroesophageal reflux disease)    Hepatic steatosis    Hypomagnesemia 10/09/2021   Replace aggressively today.  Recheck levels tomorrow including phosphorus  and potassium.   Hyponatremia 01/18/2022   Hypothyroidism    Impotence, organic    Irregular heart beat    Lesion of ulnar nerve, right upper limb 10/21/2017   Lumbar radiculopathy, chronic    Mixed hyperlipidemia    Morbid obesity ([name]) 12/30/2021   MRSA (methicillin resistant staph aureus) culture positive 08/08/2016   Formatting of this note might be different from the original. UPDATE : MRSA RE-SWAB FOR NEXT SURGERY ALSO +MRSA ON 03-24-17  + MRSA NASAL SWAB ON 08-08-16   Nodule of lower lobe of right lung    Obstructive sleep apnea 10/09/2021   Start using CPAP at night.  Wean off oxygen.   Pneumonia    Postlaminectomy syndrome of lumbosacral region 08/08/2016   PTSD (post-traumatic stress disorder)    Recurrent major depressive disorder ([name]) 01/30/2018   Sciatica 08/08/2016   Sepsis ([name]) 01/18/2022   Shock ([name]) 10/07/2021   Slurred speech 01/14/2013   Spinal stenosis 08/08/2016   Stroke ([name])    "light" stroke   Suicidal ideations 12/17/2021   Transient hypotension 01/18/2022   Type 2 diabetes mellitus with complication, without long-term current use of insulin ([name]) 10/09/2021   On metformin at home.  Fairly controlled as per patient.  Holding metformin.  Currently on sliding scale insulin.  Resume metformin on discharge.   Type 2 diabetes mellitus with diabetic neuropathy ([name])    Type 2 diabetes mellitus with hyperlipidemia [facility])     Past Surgical History:  Procedure Laterality Date   BACK SURGERY     fusion L5/S1   COLONOSCOPY WITH PROPOFOL N/A 09/03/2017   Procedure: COLONOSCOPY WITH PROPOFOL;  Surgeon: Jolinda Necessary, MD;  Location: [facility] ENDOSCOPY;  Service: Endoscopy;  Laterality: N/A;   HAND SURGERY     R & L    HERNIA REPAIR     umbilical   LEFT HEART CATH AND CORONARY ANGIOGRAPHY N/A 01/02/2022   Procedure: LEFT HEART CATH AND CORONARY ANGIOGRAPHY;  Surgeon: Swaziland, Peter M, MD;  Location: [facility] INVASIVE CV LAB;  Service: Cardiovascular;  Laterality: N/A;    MULTIPLE TOOTH EXTRACTIONS     NOSE SURGERY     fracture repair   TOTAL KNEE ARTHROPLASTY  2012   Right   TOTAL KNEE ARTHROPLASTY  04/07/2012   Procedure: TOTAL KNEE ARTHROPLASTY;  Surgeon: Ferd Householder, MD;  Location: [facility] OR;  Service: Orthopedics;  Laterality: Left;  left total knee arthroplasty    Current Medications: Current Meds  Medication Sig   acamprosate (CAMPRAL) 333 MG tablet Take 666 mg by mouth 3 (three) times daily.   albuterol (VENTOLIN HFA) 108 (90 Base) MCG/ACT inhaler Inhale 2 puffs into the lungs every 6 (six) hours as needed for shortness of breath.   aspirin EC 81 MG tablet Take 81 mg by mouth daily. Swallow whole.   divalproex (DEPAKOTE) 500 MG DR tablet Take 2 tablets (1,000 mg total) by mouth 2 (two) times daily.   folic acid (FOLVITE) 1 MG tablet Take 1 tablet (1 mg total) by mouth at bedtime.  gabapentin (NEURONTIN) 400 MG capsule Take 1 capsule (400 mg total) by mouth 3 (three) times daily.   levothyroxine (SYNTHROID) 175 MCG tablet Take 1 tablet (175 mcg total) by mouth daily at 6 (six) AM.   losartan (COZAAR) 100 MG tablet Take 1 tablet (100 mg total) by mouth daily.   lurasidone (LATUDA) 80 MG TABS tablet Take 1 tablet by mouth daily.   metFORMIN (GLUCOPHAGE) 1000 MG tablet Take 1 tablet (1,000 mg total) by mouth 2 (two) times daily.   metoprolol succinate (TOPROL-XL) 25 MG 24 hr tablet Take 1 tablet (25 mg total) by mouth daily.   nitroGLYCERIN (NITROSTAT) 0.4 MG SL tablet Place 1 tablet (0.4 mg total) under the tongue every 5 (five) minutes as needed for chest pain.   NON FORMULARY CPAP at bedtime   rosuvastatin (CRESTOR) 5 MG tablet Take 5 mg by mouth daily.   traZODone (DESYREL) 100 MG tablet Take 200 mg by mouth at bedtime as needed for sleep.     Allergies:   Carbamazepine, Adhesive [tape], and Sulfa antibiotics   Social History   Socioeconomic History   Marital status: Married    Spouse name: Not on file   Number of children: Not on file    Years of education: Not on file   Highest education level: Not on file  Occupational History   Not on file  Tobacco Use   Smoking status: Every Day    Current packs/day: 0.75    Types: Cigarettes   Smokeless tobacco: Former    Types: Engineer, drilling   Vaping status: Never Used  Substance and Sexual Activity   Alcohol use: Yes    Comment: OCCASIONALLY   Drug use: No   Sexual activity: Yes  Other Topics Concern   Not on file  Social History Narrative   Not on file   Social Drivers of Health   Financial Resource Strain: Not on file  Food Insecurity: Not on file  Transportation Needs: Not on file  Physical Activity: Not on file  Stress: Not on file  Social Connections: Not on file     Family History: The patient's family history includes Diabetes in his mother; Hypertension in his father and mother.  ROS:   Please see the history of present illness.    All other systems reviewed and are negative.  EKGs/Labs/Other Studies Reviewed:    The following studies were reviewed today: .Marland KitchenEKG Interpretation Date/Time:  Tuesday November 24 2023 07:59:29 EDT Ventricular Rate:  72 PR Interval:  180 QRS Duration:  138 QT Interval:  414 QTC Calculation: 453 R Axis:   59  Text Interpretation: Sinus rhythm with Premature atrial complexes Right bundle branch block When compared with ECG of 18-Jan-2022 01:07, PREVIOUS ECG IS PRESENT Confirmed by Belva Crome [phone]) on 11/24/2023 8:12:16 AM     Recent Labs: No results found for requested labs within last 365 days.  Recent Lipid Panel    Component Value Date/Time   CHOL 166 12/18/2021 0932   TRIG 146 12/18/2021 0932   HDL 38 (L) 12/18/2021 0932   CHOLHDL 4.4 12/18/2021 0932   VLDL 29 12/18/2021 0932   LDLCALC 99 12/18/2021 0932    Physical Exam:    VS:  BP (!) 152/86   Pulse 72   Ht 6\' 4"  (1.93 m)   Wt (!) 338 lb 9.6 oz (153.6 kg)   SpO2 93%   BMI 41.22 kg/m     Wt Readings from Last 3 Encounters:  11/24/23 (!)  338 lb 9.6 oz (153.6 kg)  04/01/22 (!) 338 lb 9.6 oz (153.6 kg)  01/20/22 (!) 331 lb 9.2 oz (150.4 kg)     GEN: Patient is in no acute distress HEENT: Normal NECK: No JVD; No carotid bruits LYMPHATICS: No lymphadenopathy CARDIAC: Hear sounds regular, 2/6 systolic murmur at the apex. RESPIRATORY:  Clear to auscultation without rales, wheezing or rhonchi  ABDOMEN: Soft, non-tender, non-distended MUSCULOSKELETAL:  No edema; No deformity  SKIN: Warm and dry NEUROLOGIC:  Alert and oriented x 3 PSYCHIATRIC:  Normal affect   Signed, Nelia Balzarine, MD  11/24/2023 8:13 AM    [facility]

## 2023-11-24 NOTE — Patient Instructions (Signed)
 Medication Instructions:  Your physician has recommended you make the following change in your medication:   Increase your Rosuvastatin to 10 mg daily. Double your current dose and next refill will be for a 10 mg tablet.  *If you need a refill on your cardiac medications before your next appointment, please call your pharmacy*   Lab Work: Your physician recommends that you return for lab work in: 6 weeks for CMP and fasting lipids. You need to have labs done when you are fasting.  You can come Monday through Friday 8:30 am to 12:00 pm and 1:15 to 4:30. You do not need to make an appointment as the order has already been placed.   If you have labs (blood work) drawn today and your tests are completely normal, you will receive your results only by: MyChart Message (if you have MyChart) OR A paper copy in the mail If you have any lab test that is abnormal or we need to change your treatment, we will call you to review the results.   Testing/Procedures: None ordered   Follow-Up: At [facility], you and your health needs are our priority.  As part of our continuing mission to provide you with exceptional heart care, we have created designated Provider Care Teams.  These Care Teams include your primary Cardiologist (physician) and Advanced Practice Providers (APPs -  Physician Assistants and Nurse Practitioners) who all work together to provide you with the care you need, when you need it.  We recommend signing up for the patient portal called "MyChart".  Sign up information is provided on this After Visit Summary.  MyChart is used to connect with patients for Virtual Visits (Telemedicine).  Patients are able to view lab/test results, encounter notes, upcoming appointments, etc.  Non-urgent messages can be sent to your provider as well.   To learn more about what you can do with MyChart, go to [email or web address].    Your next appointment:   12 month(s)  The format for your  next appointment:   In Person  Provider:   Hillis Lu, MD    Other Instructions none  Important Information About Sugar

## 2023-12-09 DIAGNOSIS — E119 Type 2 diabetes mellitus without complications: Secondary | ICD-10-CM | POA: Diagnosis not present

## 2023-12-09 DIAGNOSIS — Z8673 Personal history of transient ischemic attack (TIA), and cerebral infarction without residual deficits: Secondary | ICD-10-CM | POA: Diagnosis not present

## 2023-12-09 DIAGNOSIS — F17210 Nicotine dependence, cigarettes, uncomplicated: Secondary | ICD-10-CM | POA: Diagnosis not present

## 2023-12-09 DIAGNOSIS — K648 Other hemorrhoids: Secondary | ICD-10-CM | POA: Diagnosis not present

## 2023-12-09 DIAGNOSIS — D124 Benign neoplasm of descending colon: Secondary | ICD-10-CM | POA: Diagnosis not present

## 2023-12-09 DIAGNOSIS — D122 Benign neoplasm of ascending colon: Secondary | ICD-10-CM | POA: Diagnosis not present

## 2023-12-09 DIAGNOSIS — I10 Essential (primary) hypertension: Secondary | ICD-10-CM | POA: Diagnosis not present

## 2023-12-09 DIAGNOSIS — J45909 Unspecified asthma, uncomplicated: Secondary | ICD-10-CM | POA: Diagnosis not present

## 2023-12-09 DIAGNOSIS — K635 Polyp of colon: Secondary | ICD-10-CM | POA: Diagnosis not present

## 2023-12-09 DIAGNOSIS — G4733 Obstructive sleep apnea (adult) (pediatric): Secondary | ICD-10-CM | POA: Diagnosis not present

## 2023-12-09 DIAGNOSIS — K649 Unspecified hemorrhoids: Secondary | ICD-10-CM | POA: Diagnosis not present

## 2023-12-09 DIAGNOSIS — F1090 Alcohol use, unspecified, uncomplicated: Secondary | ICD-10-CM | POA: Diagnosis not present

## 2023-12-09 DIAGNOSIS — Z860100 Personal history of colon polyps, unspecified: Secondary | ICD-10-CM | POA: Diagnosis not present

## 2023-12-14 DIAGNOSIS — G4733 Obstructive sleep apnea (adult) (pediatric): Secondary | ICD-10-CM | POA: Diagnosis not present

## 2023-12-15 DIAGNOSIS — F1010 Alcohol abuse, uncomplicated: Secondary | ICD-10-CM | POA: Diagnosis not present

## 2023-12-15 DIAGNOSIS — F418 Other specified anxiety disorders: Secondary | ICD-10-CM | POA: Diagnosis not present

## 2023-12-15 DIAGNOSIS — F3162 Bipolar disorder, current episode mixed, moderate: Secondary | ICD-10-CM | POA: Diagnosis not present

## 2023-12-16 DIAGNOSIS — E782 Mixed hyperlipidemia: Secondary | ICD-10-CM | POA: Diagnosis not present

## 2023-12-16 DIAGNOSIS — Z23 Encounter for immunization: Secondary | ICD-10-CM | POA: Diagnosis not present

## 2023-12-16 DIAGNOSIS — I10 Essential (primary) hypertension: Secondary | ICD-10-CM | POA: Diagnosis not present

## 2023-12-16 DIAGNOSIS — E1140 Type 2 diabetes mellitus with diabetic neuropathy, unspecified: Secondary | ICD-10-CM | POA: Diagnosis not present

## 2023-12-16 DIAGNOSIS — E039 Hypothyroidism, unspecified: Secondary | ICD-10-CM | POA: Diagnosis not present

## 2023-12-25 ENCOUNTER — Encounter: Payer: Self-pay | Admitting: Podiatry

## 2023-12-25 ENCOUNTER — Ambulatory Visit (INDEPENDENT_AMBULATORY_CARE_PROVIDER_SITE_OTHER): Admitting: Podiatry

## 2023-12-25 DIAGNOSIS — M79675 Pain in left toe(s): Secondary | ICD-10-CM | POA: Diagnosis not present

## 2023-12-25 DIAGNOSIS — M79674 Pain in right toe(s): Secondary | ICD-10-CM | POA: Diagnosis not present

## 2023-12-25 DIAGNOSIS — B351 Tinea unguium: Secondary | ICD-10-CM | POA: Diagnosis not present

## 2023-12-25 DIAGNOSIS — E119 Type 2 diabetes mellitus without complications: Secondary | ICD-10-CM

## 2023-12-25 NOTE — Progress Notes (Signed)
 Chief Complaint  Patient presents with   Diabetes    "Trim my toenails." Dr. Baruch Bosch Hamrick - 3 weeks ago; A1c - 7.7     SUBJECTIVE Patient with a history of diabetes mellitus presents to office today complaining of elongated, thickened nails that cause pain while ambulating in shoes.  Patient is unable to trim their own nails. Patient is here for further evaluation and treatment.   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 CERVICAL 5-6, CERVICAL 6-7   Chronic pain syndrome    Cigarette smoker 12/30/2021   Complication of anesthesia    has woken up during surgery before    Depression 09/03/2012   Adequate for discharge   Diabetes mellitus without complication ([name])    Emphysema/COPD ([name])    Essential hypertension 10/09/2021   Blood pressure is picking up.  Hypotensive on arrival.  Resume amlodipine  today.  May not need to multiple antihypertensives.   GERD (gastroesophageal reflux disease)    Hepatic steatosis    Hypomagnesemia 10/09/2021   Replace aggressively today.  Recheck levels tomorrow including phosphorus and potassium.   Hyponatremia 01/18/2022   Hypothyroidism    Impotence, organic    Irregular heart beat    Lesion of ulnar nerve, right upper limb 10/21/2017   Lumbar radiculopathy, chronic    Mixed  hyperlipidemia    Morbid obesity ([name]) 12/30/2021   MRSA (methicillin resistant staph aureus) culture positive 08/08/2016   Formatting of this note might be different from the original. UPDATE : MRSA RE-SWAB FOR NEXT SURGERY ALSO +MRSA ON 03-24-17  + MRSA NASAL SWAB ON 08-08-16   Nodule of lower lobe of right lung    Obstructive sleep apnea 10/09/2021   Start using CPAP at night.  Wean off oxygen.   Pneumonia    Postlaminectomy syndrome of lumbosacral region 08/08/2016   PTSD (post-traumatic stress disorder)    Recurrent major depressive disorder ([name]) 01/30/2018   Sciatica 08/08/2016   Sepsis ([name]) 01/18/2022   Shock ([name]) 10/07/2021   Slurred speech 01/14/2013   Spinal stenosis 08/08/2016   Stroke ([name])    "light" stroke   Suicidal ideations 12/17/2021   Transient hypotension 01/18/2022   Type 2 diabetes mellitus with complication, without long-term current use of insulin  ([name]) 10/09/2021   On metformin  at home.  Fairly controlled as per patient.  Holding metformin .  Currently on sliding scale insulin .  Resume metformin  on discharge.   Type 2 diabetes mellitus with diabetic neuropathy ([name])    Type 2 diabetes mellitus with hyperlipidemia ([name])     OBJECTIVE General Patient is awake, alert, and oriented x 3 and in no acute distress. Derm Skin is dry and supple bilateral. Negative open lesions or macerations. Remaining integument unremarkable. Nails are tender, long, thickened and  dystrophic with subungual debris, consistent with onychomycosis, 1-5 bilateral. No signs of infection noted. Vasc  DP and PT pedal pulses palpable bilaterally. Temperature gradient within normal limits.  Neuro light touch and protective threshold sensation diminished bilaterally.  Musculoskeletal Exam No symptomatic pedal deformities noted bilateral. Muscular strength within normal limits.  ASSESSMENT 1. Diabetes Mellitus w/ peripheral neuropathy 2.  Pain due to onychomycosis of toenails bilateral  PLAN  OF CARE -Patient evaluated today. -Instructed to maintain good pedal hygiene and foot care. Stressed importance of controlling blood sugar.  -Mechanical debridement of nails 1-5 bilaterally performed using a nail nipper. Filed with dremel without incident.  -Return to clinic in 3 mos.     Dot Gazella, DPM [facility]  Dr. Dot Gazella, DPM    [address] [place], [place] [address]                Office [phone]  Fax [phone]

## 2023-12-29 DIAGNOSIS — F1020 Alcohol dependence, uncomplicated: Secondary | ICD-10-CM | POA: Diagnosis not present

## 2023-12-29 DIAGNOSIS — F4321 Adjustment disorder with depressed mood: Secondary | ICD-10-CM | POA: Diagnosis not present

## 2024-01-04 DIAGNOSIS — E7800 Pure hypercholesterolemia, unspecified: Secondary | ICD-10-CM | POA: Diagnosis not present

## 2024-01-04 DIAGNOSIS — E119 Type 2 diabetes mellitus without complications: Secondary | ICD-10-CM | POA: Diagnosis not present

## 2024-01-04 DIAGNOSIS — J45909 Unspecified asthma, uncomplicated: Secondary | ICD-10-CM | POA: Diagnosis not present

## 2024-01-04 DIAGNOSIS — S46011A Strain of muscle(s) and tendon(s) of the rotator cuff of right shoulder, initial encounter: Secondary | ICD-10-CM | POA: Diagnosis not present

## 2024-01-04 DIAGNOSIS — F329 Major depressive disorder, single episode, unspecified: Secondary | ICD-10-CM | POA: Diagnosis not present

## 2024-01-04 DIAGNOSIS — I10 Essential (primary) hypertension: Secondary | ICD-10-CM | POA: Diagnosis not present

## 2024-01-12 DIAGNOSIS — F1010 Alcohol abuse, uncomplicated: Secondary | ICD-10-CM | POA: Diagnosis not present

## 2024-01-12 DIAGNOSIS — F3162 Bipolar disorder, current episode mixed, moderate: Secondary | ICD-10-CM | POA: Diagnosis not present

## 2024-01-12 DIAGNOSIS — F418 Other specified anxiety disorders: Secondary | ICD-10-CM | POA: Diagnosis not present

## 2024-01-14 DIAGNOSIS — G4733 Obstructive sleep apnea (adult) (pediatric): Secondary | ICD-10-CM | POA: Diagnosis not present

## 2024-02-29 ENCOUNTER — Institutional Professional Consult (permissible substitution) (INDEPENDENT_AMBULATORY_CARE_PROVIDER_SITE_OTHER): Payer: Self-pay | Admitting: Otolaryngology

## 2024-02-29 NOTE — Progress Notes (Deleted)
 Dear Dr. Stephanie, Here is my assessment for our mutual patient, Rodney Leach. Thank you for allowing me the opportunity to care for your patient. Please do not hesitate to contact me should you have any other questions. Sincerely, Dr. Eldora Blanch  Otolaryngology Clinic Note Referring provider: Dr. Stephanie HPI:  Rodney Leach is a 63 y.o. male kindly referred by Dr. Stephanie for evaluation of ***.   Patient reports: *** Patient otherwise denies: - dysphagia, odynophagia, aspiration episodes or PNA, need for Heimlich, unintentional weight loss - changes in voice, shortness of breath, hemoptysis tobacco or significant alcohol history - ear pain, neck masses  H&N Surgery: Nasal Surgery (1970s) Personal or FHx of bleeding dz or anesthesia difficulty: no ***  GLP-1: *** AP/AC: ***  Tobacco: yes. Alcohol: h/o sig use. Occupation: ***. Lives in *** with ***.  PMHx: T2DM, Neuropathy, HTN, HLD, Hypothyroidism, Bipolar Dx, Back Pain, Emphysema, OSA on CPAP, CVA  Independent Review of Additional Tests or Records:  Dr. Stephanie (12/22/2023) Referral notes reviewed and uploaded or available in chart in media tab - noted hoarseness for 4 weeks, no sore throat or swallowing difficulty; no GERD, some chronic cough which is stable; Labs Referral notes reviewed and uploaded or available in chart in media tab: BMP: BUN/Cr wnl; K 5.3; TSH 0.429; Dx: Hoarseness; Rx: ref to ENT PMH/Meds/All/SocHx/FamHx/ROS:   Past Medical History:  Diagnosis Date   Acute metabolic encephalopathy 01/18/2022   Adenomatous colon polyp    ADHD, adult residual type    Alcohol use disorder 12/17/2021   Alcohol withdrawal ([name]) 12/17/2021   Angina pectoris ([name]) 12/30/2021   Anxiety 09/03/2012   Adequate for discharge   Aortic atherosclerosis ([name])    Arthritis    Asthma    Atypical chest pain    Bipolar affective ([name])    Bipolar depression ([name])    Bipolar disorder (manic depression) ([name]) 12/17/2021   Bipolar II  disorder ([name])    CAD (coronary artery disease)    Carpal tunnel syndrome of right wrist 10/21/2017   Cervical disc displacement 03/24/2017   Formatting of this note might be different from the original. CERVICAL 5-6, CERVICAL 6-7   Chronic pain syndrome    Cigarette smoker 12/30/2021   Complication of anesthesia    has woken up during surgery before    Depression 09/03/2012   Adequate for discharge   Diabetes mellitus without complication ([name])    Emphysema/COPD ([name])    Essential hypertension 10/09/2021   Blood pressure is picking up.  Hypotensive on arrival.  Resume amlodipine  today.  May not need to multiple antihypertensives.   GERD (gastroesophageal reflux disease)    Hepatic steatosis    Hypomagnesemia 10/09/2021   Replace aggressively today.  Recheck levels tomorrow including phosphorus and potassium.   Hyponatremia 01/18/2022   Hypothyroidism    Impotence, organic    Irregular heart beat    Lesion of ulnar nerve, right upper limb 10/21/2017   Lumbar radiculopathy, chronic    Mixed hyperlipidemia    Morbid obesity ([name]) 12/30/2021   MRSA (methicillin resistant staph aureus) culture positive 08/08/2016   Formatting of this note might be different from the original. UPDATE : MRSA RE-SWAB FOR NEXT SURGERY ALSO +MRSA ON 03-24-17  + MRSA NASAL SWAB ON 08-08-16   Nodule of lower lobe of right lung    Obstructive sleep apnea 10/09/2021   Start using CPAP at night.  Wean off oxygen.   Pneumonia    Postlaminectomy syndrome of lumbosacral region 08/08/2016  PTSD (post-traumatic stress disorder)    Recurrent major depressive disorder ([name]) 01/30/2018   Sciatica 08/08/2016   Sepsis ([name]) 01/18/2022   Shock ([name]) 10/07/2021   Slurred speech 01/14/2013   Spinal stenosis 08/08/2016   Stroke ([name])    light stroke   Suicidal ideations 12/17/2021   Transient hypotension 01/18/2022   Type 2 diabetes mellitus with complication, without long-term current use of insulin  ([name])  10/09/2021   On metformin  at home.  Fairly controlled as per patient.  Holding metformin .  Currently on sliding scale insulin .  Resume metformin  on discharge.   Type 2 diabetes mellitus with diabetic neuropathy ([name])    Type 2 diabetes mellitus with hyperlipidemia [facility])      Past Surgical History:  Procedure Laterality Date   BACK SURGERY     fusion L5/S1   COLONOSCOPY WITH PROPOFOL  N/A 09/03/2017   Procedure: COLONOSCOPY WITH PROPOFOL ;  Surgeon: Celestia Agent, MD;  Location: [facility] ENDOSCOPY;  Service: Endoscopy;  Laterality: N/A;   HAND SURGERY     R & L    HERNIA REPAIR     umbilical   LEFT HEART CATH AND CORONARY ANGIOGRAPHY N/A 01/02/2022   Procedure: LEFT HEART CATH AND CORONARY ANGIOGRAPHY;  Surgeon: [place], Peter M, MD;  [facility];  Service: Cardiovascular;  Laterality: N/A;   MULTIPLE TOOTH EXTRACTIONS     NOSE SURGERY     fracture repair   TOTAL KNEE ARTHROPLASTY  2012   Right   TOTAL KNEE ARTHROPLASTY  04/07/2012   Procedure: TOTAL KNEE ARTHROPLASTY;  Surgeon: Toribio JULIANNA Chancy, MD;  Location: [facility] OR;  Service: Orthopedics;  Laterality: Left;  left total knee arthroplasty    Family History  Problem Relation Age of Onset   Hypertension Mother    Diabetes Mother    Hypertension Father      Social Connections: Not on file      Current Outpatient Medications:    acamprosate (CAMPRAL) 333 MG tablet, Take 666 mg by mouth 3 (three) times daily., Disp: , Rfl:    albuterol  (VENTOLIN  HFA) 108 (90 Base) MCG/ACT inhaler, Inhale 2 puffs into the lungs every 6 (six) hours as needed for shortness of breath., Disp: 1 each, Rfl: 1   aspirin  EC 81 MG tablet, Take 81 mg by mouth daily. Swallow whole., Disp: , Rfl:    divalproex  (DEPAKOTE ) 500 MG DR tablet, Take 2 tablets (1,000 mg total) by mouth 2 (two) times daily., Disp: 120 tablet, Rfl: 1   folic acid  (FOLVITE ) 1 MG tablet, Take 1 tablet (1 mg total) by mouth at bedtime., Disp: 30 tablet, Rfl: 1   gabapentin   (NEURONTIN ) 400 MG capsule, Take 1 capsule (400 mg total) by mouth 3 (three) times daily., Disp: 90 capsule, Rfl: 1   levothyroxine  (SYNTHROID ) 175 MCG tablet, Take 1 tablet (175 mcg total) by mouth daily at 6 (six) AM., Disp: 30 tablet, Rfl: 1   losartan  (COZAAR ) 100 MG tablet, Take 1 tablet (100 mg total) by mouth daily., Disp: 30 tablet, Rfl: 1   lurasidone (LATUDA) 80 MG TABS tablet, Take 1 tablet by mouth daily., Disp: , Rfl:    metFORMIN  (GLUCOPHAGE ) 1000 MG tablet, Take 1 tablet (1,000 mg total) by mouth 2 (two) times daily., Disp: , Rfl:    metoprolol  succinate (TOPROL -XL) 25 MG 24 hr tablet, Take 1 tablet (25 mg total) by mouth daily., Disp: 30 tablet, Rfl: 1   nitroGLYCERIN  (NITROSTAT ) 0.4 MG SL tablet, Place 1 tablet (0.4 mg total)  under the tongue every 5 (five) minutes as needed for chest pain., Disp: 30 tablet, Rfl: 1   NON FORMULARY, CPAP at bedtime, Disp: , Rfl:    rosuvastatin  (CRESTOR ) 10 MG tablet, Take 1 tablet (10 mg total) by mouth daily., Disp: 90 tablet, Rfl: 3   traZODone  (DESYREL ) 100 MG tablet, Take 200 mg by mouth at bedtime as needed for sleep., Disp: , Rfl:    Physical Exam:   There were no vitals taken for this visit.  Salient findings:  CN II-XII intact *** Bilateral EAC clear and TM intact with well pneumatized middle ear spaces Weber 512: *** Rinne 512: AC > BC b/l *** Rine 1024: AC > BC b/l *** Anterior rhinoscopy: Septum ***; bilateral inferior turbinates with *** No lesions of oral cavity/oropharynx; dentition *** No obviously palpable neck masses/lymphadenopathy/thyromegaly No respiratory distress or stridor***  Seprately Identifiable Procedures:  Prior to initiating any procedures, risks/benefits/alternatives were explained to the patient and verbal consent obtained. None***  Impression & Plans:  Rodney Leach is a 63 y.o. male with ***  No diagnosis found.   - f/u ***  See below regarding exact medications prescribed this encounter  including dosages and route: No orders of the defined types were placed in this encounter.     Thank you for allowing me the opportunity to care for your patient. Please do not hesitate to contact me should you have any other questions.  Sincerely, Eldora Blanch, MD Otolaryngologist (ENT), [facility] Phone: [phone] Fax: [phone]  02/29/2024, 1:56 PM   MDM:  Level *** Complexity/Problems addressed: *** Data complexity: *** independent review of *** - Morbidity: ***  - Prescription Drug prescribed or managed: ***

## 2024-03-25 ENCOUNTER — Ambulatory Visit: Admitting: Podiatry

## 2024-06-22 DIAGNOSIS — F3132 Bipolar disorder, current episode depressed, moderate: Secondary | ICD-10-CM | POA: Diagnosis not present

## 2024-06-22 DIAGNOSIS — F1010 Alcohol abuse, uncomplicated: Secondary | ICD-10-CM | POA: Diagnosis not present

## 2024-06-23 DIAGNOSIS — F1010 Alcohol abuse, uncomplicated: Secondary | ICD-10-CM | POA: Diagnosis not present

## 2024-06-23 DIAGNOSIS — F3132 Bipolar disorder, current episode depressed, moderate: Secondary | ICD-10-CM | POA: Diagnosis not present

## 2024-07-05 ENCOUNTER — Ambulatory Visit (INDEPENDENT_AMBULATORY_CARE_PROVIDER_SITE_OTHER): Payer: Self-pay | Admitting: Podiatry

## 2024-07-05 ENCOUNTER — Encounter: Payer: Self-pay | Admitting: Podiatry

## 2024-07-05 VITALS — Ht 76.0 in | Wt 338.6 lb

## 2024-07-05 DIAGNOSIS — M79674 Pain in right toe(s): Secondary | ICD-10-CM | POA: Diagnosis not present

## 2024-07-05 DIAGNOSIS — M79675 Pain in left toe(s): Secondary | ICD-10-CM

## 2024-07-05 DIAGNOSIS — B351 Tinea unguium: Secondary | ICD-10-CM

## 2024-07-05 NOTE — Progress Notes (Signed)
 Chief Complaint  Patient presents with   Nail Problem    Pt is here for [facility].    SUBJECTIVE Patient with a history of diabetes mellitus presents to office today complaining of elongated, thickened nails that cause pain while ambulating in shoes.  Patient is unable to trim their own nails. Patient is here for further evaluation and treatment.  Past Medical History:  Diagnosis Date   Acute metabolic encephalopathy 01/18/2022   Adenomatous colon polyp    ADHD, adult residual type    Alcohol use disorder 12/17/2021   Alcohol withdrawal ([name]) 12/17/2021   Angina pectoris 12/30/2021   Anxiety 09/03/2012   Adequate for discharge   Aortic atherosclerosis    Arthritis    Asthma    Atypical chest pain    Bipolar affective ([name])    Bipolar depression ([name])    Bipolar disorder (manic depression) ([name]) 12/17/2021   Bipolar II disorder ([name])    CAD (coronary artery disease)    Carpal tunnel syndrome of right wrist 10/21/2017   Cervical disc displacement 03/24/2017   Formatting of this note might be different from the original. CERVICAL 5-6, CERVICAL 6-7   Chronic pain syndrome    Cigarette smoker 12/30/2021   Complication of anesthesia    has woken up during surgery before    Depression 09/03/2012   Adequate for discharge   Diabetes mellitus without complication ([name])    Emphysema/COPD    Essential hypertension 10/09/2021   Blood pressure is picking up.  Hypotensive on arrival.  Resume amlodipine  today.  May not need to multiple antihypertensives.   GERD (gastroesophageal reflux disease)    Hepatic steatosis    Hypomagnesemia 10/09/2021   Replace aggressively today.  Recheck levels tomorrow including phosphorus and potassium.   Hyponatremia 01/18/2022   Hypothyroidism    Impotence, organic    Irregular heart beat    Lesion of ulnar nerve, right upper limb 10/21/2017   Lumbar radiculopathy, chronic    Mixed hyperlipidemia    Morbid obesity ([name]) 12/30/2021   MRSA (methicillin  resistant staph aureus) culture positive 08/08/2016   Formatting of this note might be different from the original. UPDATE : MRSA RE-SWAB FOR NEXT SURGERY ALSO +MRSA ON 03-24-17  + MRSA NASAL SWAB ON 08-08-16   Nodule of lower lobe of right lung    Obstructive sleep apnea 10/09/2021   Start using CPAP at night.  Wean off oxygen.   Pneumonia    Postlaminectomy syndrome of lumbosacral region 08/08/2016   PTSD (post-traumatic stress disorder)    Recurrent major depressive disorder 01/30/2018   Sciatica 08/08/2016   Sepsis ([name]) 01/18/2022   Shock ([name]) 10/07/2021   Slurred speech 01/14/2013   Spinal stenosis 08/08/2016   Stroke ([name])    light stroke   Suicidal ideations 12/17/2021   Transient hypotension 01/18/2022   Type 2 diabetes mellitus with complication, without long-term current use of insulin  ([name]) 10/09/2021   On metformin  at home.  Fairly controlled as per patient.  Holding metformin .  Currently on sliding scale insulin .  Resume metformin  on discharge.   Type 2 diabetes mellitus with diabetic neuropathy ([name])    Type 2 diabetes mellitus with hyperlipidemia ([name])     Allergies  Allergen Reactions   Carbamazepine Hives   Adhesive [Tape] Rash   Sulfa Antibiotics Rash     OBJECTIVE General Patient is awake, alert, and oriented x 3 and in no acute distress. Derm Skin is dry and supple bilateral. Negative open lesions or macerations.  Remaining integument unremarkable. Nails are tender, long, thickened and dystrophic with subungual debris, consistent with onychomycosis, 1-5 bilateral. No signs of infection noted. Vasc  DP and PT pedal pulses palpable bilaterally. Temperature gradient within normal limits.  Neuro Epicritic and protective threshold sensation diminished bilaterally.  Musculoskeletal Exam No symptomatic pedal deformities noted bilateral. Muscular strength within normal limits.  ASSESSMENT 1. Diabetes Mellitus w/ peripheral neuropathy 2.  Pain due to  onychomycosis of toenails bilateral  PLAN OF CARE 1. Patient evaluated today. 2. Instructed to maintain good pedal hygiene and foot care. Stressed importance of controlling blood sugar.  3. Mechanical debridement of nails 1-5 bilaterally performed using a nail nipper. Filed with dremel without incident.  4. Return to clinic in 3 mos.     Thresa EMERSON Sar, DPM [facility]  Dr. Thresa EMERSON Sar, DPM    [address] [place], [place] [address]                Office [phone]  Fax [phone]

## 2024-07-06 DIAGNOSIS — F1010 Alcohol abuse, uncomplicated: Secondary | ICD-10-CM | POA: Diagnosis not present

## 2024-07-06 DIAGNOSIS — F3132 Bipolar disorder, current episode depressed, moderate: Secondary | ICD-10-CM | POA: Diagnosis not present

## 2024-07-20 DIAGNOSIS — F3132 Bipolar disorder, current episode depressed, moderate: Secondary | ICD-10-CM | POA: Diagnosis not present

## 2024-07-20 DIAGNOSIS — F1010 Alcohol abuse, uncomplicated: Secondary | ICD-10-CM | POA: Diagnosis not present

## 2024-09-20 ENCOUNTER — Ambulatory Visit: Admitting: Podiatry

## 2024-10-07 ENCOUNTER — Ambulatory Visit: Admitting: Podiatry
